# Patient Record
Sex: Female | Born: 1955 | Race: White | Hispanic: No | Marital: Married | Smoking: Former smoker
Health system: Southern US, Community
[De-identification: ages and names within clinical notes are randomized; demographics above are authoritative.]

## PROBLEM LIST (undated history)

## (undated) DIAGNOSIS — J449 Chronic obstructive pulmonary disease, unspecified: Secondary | ICD-10-CM

## (undated) DIAGNOSIS — F419 Anxiety disorder, unspecified: Secondary | ICD-10-CM

## (undated) DIAGNOSIS — G43909 Migraine, unspecified, not intractable, without status migrainosus: Secondary | ICD-10-CM

## (undated) DIAGNOSIS — K279 Peptic ulcer, site unspecified, unspecified as acute or chronic, without hemorrhage or perforation: Secondary | ICD-10-CM

## (undated) DIAGNOSIS — I341 Nonrheumatic mitral (valve) prolapse: Secondary | ICD-10-CM

## (undated) DIAGNOSIS — E785 Hyperlipidemia, unspecified: Secondary | ICD-10-CM

## (undated) DIAGNOSIS — F32A Depression, unspecified: Secondary | ICD-10-CM

## (undated) DIAGNOSIS — R011 Cardiac murmur, unspecified: Secondary | ICD-10-CM

## (undated) DIAGNOSIS — O43029 Fetus-to-fetus placental transfusion syndrome, unspecified trimester: Secondary | ICD-10-CM

## (undated) DIAGNOSIS — M797 Fibromyalgia: Secondary | ICD-10-CM

## (undated) DIAGNOSIS — K759 Inflammatory liver disease, unspecified: Secondary | ICD-10-CM

## (undated) DIAGNOSIS — M5090 Cervical disc disorder, unspecified, unspecified cervical region: Secondary | ICD-10-CM

## (undated) DIAGNOSIS — J439 Emphysema, unspecified: Secondary | ICD-10-CM

## (undated) DIAGNOSIS — M810 Age-related osteoporosis without current pathological fracture: Secondary | ICD-10-CM

## (undated) HISTORY — DX: Migraine, unspecified, not intractable, without status migrainosus: G43.909

## (undated) HISTORY — DX: Nonrheumatic mitral (valve) prolapse: I34.1

## (undated) HISTORY — DX: Fibromyalgia: M79.7

## (undated) HISTORY — DX: Depression, unspecified: F32.A

## (undated) HISTORY — DX: Age-related osteoporosis without current pathological fracture: M81.0

## (undated) HISTORY — DX: Hyperlipidemia, unspecified: E78.5

## (undated) HISTORY — DX: Chronic obstructive pulmonary disease, unspecified: J44.9

## (undated) HISTORY — DX: Cervical disc disorder, unspecified, unspecified cervical region: M50.90

## (undated) HISTORY — DX: Peptic ulcer, site unspecified, unspecified as acute or chronic, without hemorrhage or perforation: K27.9

## (undated) HISTORY — DX: Inflammatory liver disease, unspecified: K75.9

## (undated) HISTORY — PX: HYSTEROSCOPY: SHX211

## (undated) HISTORY — DX: Cardiac murmur, unspecified: R01.1

## (undated) HISTORY — DX: Anxiety disorder, unspecified: F41.9

## (undated) HISTORY — DX: Fetus-to-fetus placental transfusion syndrome, unspecified trimester: O43.029

## (undated) HISTORY — PX: URETHRAL DILATION: SUR417

---

## 1991-09-04 DIAGNOSIS — O43029 Fetus-to-fetus placental transfusion syndrome, unspecified trimester: Secondary | ICD-10-CM

## 1991-09-04 HISTORY — DX: Fetus-to-fetus placental transfusion syndrome, unspecified trimester: O43.029

## 1998-11-05 ENCOUNTER — Ambulatory Visit (HOSPITAL_COMMUNITY): Admission: RE | Admit: 1998-11-05 | Discharge: 1998-11-05 | Payer: Self-pay | Admitting: Obstetrics and Gynecology

## 1999-04-30 ENCOUNTER — Encounter: Payer: Self-pay | Admitting: Orthopedic Surgery

## 1999-04-30 ENCOUNTER — Ambulatory Visit (HOSPITAL_COMMUNITY): Admission: RE | Admit: 1999-04-30 | Discharge: 1999-04-30 | Payer: Self-pay | Admitting: Orthopedic Surgery

## 1999-05-05 ENCOUNTER — Encounter: Payer: Self-pay | Admitting: Orthopedic Surgery

## 1999-05-05 ENCOUNTER — Ambulatory Visit (HOSPITAL_COMMUNITY): Admission: RE | Admit: 1999-05-05 | Discharge: 1999-05-05 | Payer: Self-pay | Admitting: Orthopedic Surgery

## 1999-12-11 ENCOUNTER — Other Ambulatory Visit: Admission: RE | Admit: 1999-12-11 | Discharge: 1999-12-11 | Payer: Self-pay | Admitting: Family Medicine

## 2001-01-08 ENCOUNTER — Other Ambulatory Visit: Admission: RE | Admit: 2001-01-08 | Discharge: 2001-01-08 | Payer: Self-pay | Admitting: Family Medicine

## 2002-04-21 ENCOUNTER — Inpatient Hospital Stay (HOSPITAL_COMMUNITY): Admission: EM | Admit: 2002-04-21 | Discharge: 2002-04-25 | Payer: Self-pay | Admitting: Emergency Medicine

## 2002-04-21 ENCOUNTER — Encounter: Payer: Self-pay | Admitting: Internal Medicine

## 2002-04-23 ENCOUNTER — Encounter: Payer: Self-pay | Admitting: Gastroenterology

## 2002-04-24 ENCOUNTER — Encounter: Payer: Self-pay | Admitting: Internal Medicine

## 2002-05-15 ENCOUNTER — Encounter: Payer: Self-pay | Admitting: Gastroenterology

## 2002-05-15 ENCOUNTER — Ambulatory Visit (HOSPITAL_COMMUNITY): Admission: RE | Admit: 2002-05-15 | Discharge: 2002-05-15 | Payer: Self-pay | Admitting: Gastroenterology

## 2002-06-03 HISTORY — PX: CHOLECYSTECTOMY: SHX55

## 2002-06-04 ENCOUNTER — Observation Stay (HOSPITAL_COMMUNITY): Admission: RE | Admit: 2002-06-04 | Discharge: 2002-06-05 | Payer: Self-pay | Admitting: Surgery

## 2002-06-04 ENCOUNTER — Encounter (INDEPENDENT_AMBULATORY_CARE_PROVIDER_SITE_OTHER): Payer: Self-pay | Admitting: Specialist

## 2002-06-04 ENCOUNTER — Encounter: Payer: Self-pay | Admitting: Surgery

## 2002-06-18 ENCOUNTER — Ambulatory Visit (HOSPITAL_COMMUNITY): Admission: RE | Admit: 2002-06-18 | Discharge: 2002-06-18 | Payer: Self-pay | Admitting: Gastroenterology

## 2002-06-18 ENCOUNTER — Encounter: Payer: Self-pay | Admitting: Gastroenterology

## 2002-06-19 ENCOUNTER — Encounter: Admission: RE | Admit: 2002-06-19 | Discharge: 2002-06-19 | Payer: Self-pay | Admitting: *Deleted

## 2002-07-01 ENCOUNTER — Ambulatory Visit (HOSPITAL_COMMUNITY): Admission: RE | Admit: 2002-07-01 | Discharge: 2002-07-01 | Payer: Self-pay | Admitting: *Deleted

## 2002-07-01 ENCOUNTER — Encounter (INDEPENDENT_AMBULATORY_CARE_PROVIDER_SITE_OTHER): Payer: Self-pay | Admitting: *Deleted

## 2002-07-11 ENCOUNTER — Ambulatory Visit (HOSPITAL_COMMUNITY): Admission: RE | Admit: 2002-07-11 | Discharge: 2002-07-11 | Payer: Self-pay | Admitting: *Deleted

## 2002-07-11 ENCOUNTER — Ambulatory Visit (HOSPITAL_COMMUNITY): Admission: RE | Admit: 2002-07-11 | Discharge: 2002-07-11 | Payer: Self-pay | Admitting: Orthopedic Surgery

## 2002-07-11 ENCOUNTER — Encounter: Payer: Self-pay | Admitting: *Deleted

## 2002-07-11 ENCOUNTER — Encounter: Payer: Self-pay | Admitting: Orthopedic Surgery

## 2002-10-16 ENCOUNTER — Ambulatory Visit (HOSPITAL_COMMUNITY): Admission: RE | Admit: 2002-10-16 | Discharge: 2002-10-16 | Payer: Self-pay | Admitting: *Deleted

## 2002-10-16 ENCOUNTER — Encounter (INDEPENDENT_AMBULATORY_CARE_PROVIDER_SITE_OTHER): Payer: Self-pay | Admitting: Specialist

## 2003-04-12 ENCOUNTER — Ambulatory Visit (HOSPITAL_COMMUNITY): Admission: RE | Admit: 2003-04-12 | Discharge: 2003-04-12 | Payer: Self-pay | Admitting: Internal Medicine

## 2003-05-21 ENCOUNTER — Encounter (INDEPENDENT_AMBULATORY_CARE_PROVIDER_SITE_OTHER): Payer: Self-pay | Admitting: Specialist

## 2003-05-21 ENCOUNTER — Ambulatory Visit (HOSPITAL_COMMUNITY): Admission: RE | Admit: 2003-05-21 | Discharge: 2003-05-21 | Payer: Self-pay | Admitting: *Deleted

## 2003-08-13 ENCOUNTER — Ambulatory Visit (HOSPITAL_COMMUNITY): Admission: RE | Admit: 2003-08-13 | Discharge: 2003-08-13 | Payer: Self-pay | Admitting: *Deleted

## 2003-11-23 ENCOUNTER — Other Ambulatory Visit: Admission: RE | Admit: 2003-11-23 | Discharge: 2003-11-23 | Payer: Self-pay | Admitting: Family Medicine

## 2004-05-22 ENCOUNTER — Encounter: Admission: RE | Admit: 2004-05-22 | Discharge: 2004-05-22 | Payer: Self-pay | Admitting: Family Medicine

## 2004-05-24 ENCOUNTER — Inpatient Hospital Stay (HOSPITAL_COMMUNITY): Admission: AD | Admit: 2004-05-24 | Discharge: 2004-05-26 | Payer: Self-pay | Admitting: Family Medicine

## 2004-05-30 ENCOUNTER — Ambulatory Visit (HOSPITAL_COMMUNITY): Admission: RE | Admit: 2004-05-30 | Discharge: 2004-05-30 | Payer: Self-pay | Admitting: *Deleted

## 2004-07-14 ENCOUNTER — Ambulatory Visit (HOSPITAL_COMMUNITY): Admission: RE | Admit: 2004-07-14 | Discharge: 2004-07-14 | Payer: Self-pay | Admitting: *Deleted

## 2004-07-14 ENCOUNTER — Encounter (INDEPENDENT_AMBULATORY_CARE_PROVIDER_SITE_OTHER): Payer: Self-pay | Admitting: *Deleted

## 2004-07-31 ENCOUNTER — Ambulatory Visit: Payer: Self-pay | Admitting: Internal Medicine

## 2004-09-03 DIAGNOSIS — M5090 Cervical disc disorder, unspecified, unspecified cervical region: Secondary | ICD-10-CM

## 2004-09-03 HISTORY — DX: Cervical disc disorder, unspecified, unspecified cervical region: M50.90

## 2004-09-03 HISTORY — PX: CERVICAL LAMINECTOMY: SHX94

## 2004-10-04 ENCOUNTER — Ambulatory Visit: Payer: Self-pay | Admitting: Family Medicine

## 2004-11-08 ENCOUNTER — Ambulatory Visit: Payer: Self-pay | Admitting: Family Medicine

## 2004-12-14 ENCOUNTER — Other Ambulatory Visit: Admission: RE | Admit: 2004-12-14 | Discharge: 2004-12-14 | Payer: Self-pay | Admitting: Family Medicine

## 2004-12-14 ENCOUNTER — Ambulatory Visit: Payer: Self-pay | Admitting: Family Medicine

## 2004-12-22 ENCOUNTER — Ambulatory Visit: Payer: Self-pay | Admitting: Family Medicine

## 2005-01-12 ENCOUNTER — Encounter: Admission: RE | Admit: 2005-01-12 | Discharge: 2005-01-12 | Payer: Self-pay | Admitting: Orthopedic Surgery

## 2005-02-13 ENCOUNTER — Ambulatory Visit (HOSPITAL_COMMUNITY): Admission: RE | Admit: 2005-02-13 | Discharge: 2005-02-13 | Payer: Self-pay | Admitting: Neurosurgery

## 2005-02-16 ENCOUNTER — Encounter: Admission: RE | Admit: 2005-02-16 | Discharge: 2005-02-16 | Payer: Self-pay | Admitting: Neurosurgery

## 2005-02-21 ENCOUNTER — Ambulatory Visit: Payer: Self-pay | Admitting: Family Medicine

## 2005-02-26 ENCOUNTER — Ambulatory Visit: Payer: Self-pay | Admitting: Family Medicine

## 2005-03-22 ENCOUNTER — Ambulatory Visit: Payer: Self-pay | Admitting: Family Medicine

## 2005-04-24 ENCOUNTER — Ambulatory Visit: Payer: Self-pay | Admitting: Family Medicine

## 2005-05-17 ENCOUNTER — Ambulatory Visit: Payer: Self-pay | Admitting: Family Medicine

## 2005-05-18 ENCOUNTER — Encounter: Admission: RE | Admit: 2005-05-18 | Discharge: 2005-05-18 | Payer: Self-pay | Admitting: Orthopedic Surgery

## 2005-05-30 ENCOUNTER — Ambulatory Visit (HOSPITAL_COMMUNITY): Admission: RE | Admit: 2005-05-30 | Discharge: 2005-06-02 | Payer: Self-pay | Admitting: Neurosurgery

## 2005-09-10 ENCOUNTER — Ambulatory Visit: Payer: Self-pay | Admitting: Family Medicine

## 2005-09-19 ENCOUNTER — Ambulatory Visit: Payer: Self-pay | Admitting: Family Medicine

## 2005-09-20 ENCOUNTER — Ambulatory Visit: Payer: Self-pay | Admitting: Cardiology

## 2005-09-26 ENCOUNTER — Ambulatory Visit: Payer: Self-pay | Admitting: Family Medicine

## 2005-11-20 ENCOUNTER — Ambulatory Visit: Payer: Self-pay | Admitting: Family Medicine

## 2005-11-28 ENCOUNTER — Ambulatory Visit: Payer: Self-pay | Admitting: Family Medicine

## 2006-01-15 ENCOUNTER — Ambulatory Visit: Payer: Self-pay | Admitting: Family Medicine

## 2006-01-15 ENCOUNTER — Encounter: Payer: Self-pay | Admitting: Family Medicine

## 2006-01-16 ENCOUNTER — Other Ambulatory Visit: Admission: RE | Admit: 2006-01-16 | Discharge: 2006-01-16 | Payer: Self-pay | Admitting: Family Medicine

## 2006-01-18 ENCOUNTER — Ambulatory Visit: Payer: Self-pay | Admitting: Family Medicine

## 2006-02-10 ENCOUNTER — Encounter: Admission: RE | Admit: 2006-02-10 | Discharge: 2006-02-10 | Payer: Self-pay | Admitting: Rheumatology

## 2006-02-16 ENCOUNTER — Ambulatory Visit: Payer: Self-pay | Admitting: Family Medicine

## 2006-02-21 ENCOUNTER — Ambulatory Visit: Payer: Self-pay | Admitting: Family Medicine

## 2006-05-14 ENCOUNTER — Ambulatory Visit: Payer: Self-pay | Admitting: Family Medicine

## 2006-06-05 ENCOUNTER — Ambulatory Visit: Payer: Self-pay | Admitting: Family Medicine

## 2006-10-22 ENCOUNTER — Ambulatory Visit: Payer: Self-pay | Admitting: Internal Medicine

## 2007-01-21 ENCOUNTER — Ambulatory Visit: Payer: Self-pay | Admitting: Family Medicine

## 2007-01-21 LAB — CONVERTED CEMR LAB
ALT: 7 units/L (ref 0–40)
AST: 15 units/L (ref 0–37)
Albumin: 3.4 g/dL — ABNORMAL LOW (ref 3.5–5.2)
Alkaline Phosphatase: 93 units/L (ref 39–117)
BUN: 11 mg/dL (ref 6–23)
Bilirubin, Direct: 0.1 mg/dL (ref 0.0–0.3)
CO2: 28 meq/L (ref 19–32)
Calcium: 9 mg/dL (ref 8.4–10.5)
Chloride: 110 meq/L (ref 96–112)
Cholesterol: 219 mg/dL (ref 0–200)
Creatinine, Ser: 0.6 mg/dL (ref 0.4–1.2)
Direct LDL: 138.3 mg/dL
GFR calc Af Amer: 137 mL/min
GFR calc non Af Amer: 113 mL/min
Glucose, Bld: 91 mg/dL (ref 70–99)
HCT: 43.8 % (ref 36.0–46.0)
HDL: 45.9 mg/dL (ref >39.0)
Hemoglobin: 14.9 g/dL (ref 12.0–15.0)
MCHC: 34 g/dL (ref 30.0–36.0)
MCV: 96 fL (ref 78.0–100.0)
Platelets: 259 10*3/uL (ref 150–400)
Potassium: 3.6 meq/L (ref 3.5–5.1)
RBC: 4.56 M/uL (ref 3.87–5.11)
RDW: 13.3 % (ref 11.5–14.6)
Sodium: 142 meq/L (ref 135–145)
TSH: 2.33 microintl units/mL (ref 0.35–5.50)
Total Bilirubin: 0.4 mg/dL (ref 0.3–1.2)
Total CHOL/HDL Ratio: 4.8
Total Protein: 6.8 g/dL (ref 6.0–8.3)
Triglycerides: 144 mg/dL (ref 0–149)
VLDL: 29 mg/dL (ref 0–40)
WBC: 7.5 10*3/uL (ref 4.5–10.5)

## 2007-01-28 ENCOUNTER — Other Ambulatory Visit: Admission: RE | Admit: 2007-01-28 | Discharge: 2007-01-28 | Payer: Self-pay | Admitting: Family Medicine

## 2007-01-28 ENCOUNTER — Encounter: Payer: Self-pay | Admitting: Family Medicine

## 2007-01-28 ENCOUNTER — Ambulatory Visit: Payer: Self-pay | Admitting: Family Medicine

## 2007-04-09 ENCOUNTER — Encounter: Payer: Self-pay | Admitting: Family Medicine

## 2007-04-22 ENCOUNTER — Telehealth: Payer: Self-pay | Admitting: Family Medicine

## 2007-05-09 DIAGNOSIS — R519 Headache, unspecified: Secondary | ICD-10-CM | POA: Insufficient documentation

## 2007-05-09 DIAGNOSIS — R51 Headache: Secondary | ICD-10-CM | POA: Insufficient documentation

## 2007-05-16 ENCOUNTER — Ambulatory Visit: Payer: Self-pay | Admitting: Family Medicine

## 2007-05-16 DIAGNOSIS — T7841XA Arthus phenomenon, initial encounter: Secondary | ICD-10-CM | POA: Insufficient documentation

## 2007-05-16 DIAGNOSIS — E785 Hyperlipidemia, unspecified: Secondary | ICD-10-CM | POA: Insufficient documentation

## 2007-05-27 ENCOUNTER — Encounter: Payer: Self-pay | Admitting: Family Medicine

## 2007-05-27 LAB — CONVERTED CEMR LAB
ALT: 10 units/L (ref 0–35)
AST: 18 units/L (ref 0–37)
Albumin: 3.6 g/dL (ref 3.5–5.2)
Alkaline Phosphatase: 95 units/L (ref 39–117)
Bilirubin, Direct: 0.1 mg/dL (ref 0.0–0.3)
Cholesterol: 223 mg/dL (ref 0–200)
Direct LDL: 147.3 mg/dL
HDL: 53.1 mg/dL (ref >39.0)
Total Bilirubin: 0.6 mg/dL (ref 0.3–1.2)
Total CHOL/HDL Ratio: 4.2
Total Protein: 6.5 g/dL (ref 6.0–8.3)
Triglycerides: 102 mg/dL (ref 0–149)
VLDL: 20 mg/dL (ref 0–40)

## 2007-07-03 ENCOUNTER — Ambulatory Visit: Payer: Self-pay | Admitting: Family Medicine

## 2007-07-03 DIAGNOSIS — IMO0001 Reserved for inherently not codable concepts without codable children: Secondary | ICD-10-CM | POA: Insufficient documentation

## 2007-07-03 DIAGNOSIS — M797 Fibromyalgia: Secondary | ICD-10-CM | POA: Insufficient documentation

## 2007-09-09 ENCOUNTER — Ambulatory Visit: Payer: Self-pay | Admitting: Family Medicine

## 2007-09-09 DIAGNOSIS — J209 Acute bronchitis, unspecified: Secondary | ICD-10-CM | POA: Insufficient documentation

## 2007-09-22 ENCOUNTER — Telehealth: Payer: Self-pay | Admitting: Family Medicine

## 2007-10-01 ENCOUNTER — Telehealth: Payer: Self-pay | Admitting: Family Medicine

## 2007-10-14 ENCOUNTER — Ambulatory Visit: Payer: Self-pay | Admitting: Family Medicine

## 2007-10-14 DIAGNOSIS — R319 Hematuria, unspecified: Secondary | ICD-10-CM | POA: Insufficient documentation

## 2007-10-15 ENCOUNTER — Encounter: Payer: Self-pay | Admitting: Family Medicine

## 2007-10-18 ENCOUNTER — Emergency Department (HOSPITAL_COMMUNITY): Admission: EM | Admit: 2007-10-18 | Discharge: 2007-10-18 | Payer: Self-pay | Admitting: Emergency Medicine

## 2007-10-20 ENCOUNTER — Encounter: Payer: Self-pay | Admitting: Family Medicine

## 2007-10-23 ENCOUNTER — Encounter: Payer: Self-pay | Admitting: Family Medicine

## 2007-10-24 ENCOUNTER — Encounter: Admission: RE | Admit: 2007-10-24 | Discharge: 2007-10-24 | Payer: Self-pay | Admitting: Rheumatology

## 2007-11-26 ENCOUNTER — Telehealth: Payer: Self-pay | Admitting: Family Medicine

## 2007-12-15 ENCOUNTER — Ambulatory Visit (HOSPITAL_BASED_OUTPATIENT_CLINIC_OR_DEPARTMENT_OTHER): Admission: RE | Admit: 2007-12-15 | Discharge: 2007-12-15 | Payer: Self-pay | Admitting: Urology

## 2008-01-08 ENCOUNTER — Encounter: Payer: Self-pay | Admitting: Family Medicine

## 2008-01-20 ENCOUNTER — Ambulatory Visit: Payer: Self-pay | Admitting: Family Medicine

## 2008-01-20 DIAGNOSIS — R0789 Other chest pain: Secondary | ICD-10-CM | POA: Insufficient documentation

## 2008-01-22 ENCOUNTER — Telehealth (INDEPENDENT_AMBULATORY_CARE_PROVIDER_SITE_OTHER): Payer: Self-pay | Admitting: *Deleted

## 2008-03-16 ENCOUNTER — Telehealth: Payer: Self-pay | Admitting: Family Medicine

## 2008-03-23 ENCOUNTER — Ambulatory Visit: Payer: Self-pay | Admitting: Family Medicine

## 2008-03-23 ENCOUNTER — Other Ambulatory Visit: Admission: RE | Admit: 2008-03-23 | Discharge: 2008-03-23 | Payer: Self-pay | Admitting: Family Medicine

## 2008-03-23 ENCOUNTER — Encounter: Payer: Self-pay | Admitting: Family Medicine

## 2008-03-24 LAB — CONVERTED CEMR LAB: Pap Smear: NORMAL

## 2008-03-30 ENCOUNTER — Encounter: Payer: Self-pay | Admitting: Family Medicine

## 2008-04-15 ENCOUNTER — Encounter: Payer: Self-pay | Admitting: Family Medicine

## 2008-08-11 ENCOUNTER — Encounter: Payer: Self-pay | Admitting: Family Medicine

## 2008-09-09 ENCOUNTER — Telehealth: Payer: Self-pay | Admitting: Family Medicine

## 2008-10-04 ENCOUNTER — Ambulatory Visit (HOSPITAL_BASED_OUTPATIENT_CLINIC_OR_DEPARTMENT_OTHER): Admission: RE | Admit: 2008-10-04 | Discharge: 2008-10-04 | Payer: Self-pay | Admitting: Urology

## 2008-10-19 ENCOUNTER — Telehealth: Payer: Self-pay | Admitting: Family Medicine

## 2008-11-08 ENCOUNTER — Telehealth: Payer: Self-pay | Admitting: Family Medicine

## 2009-01-03 ENCOUNTER — Telehealth: Payer: Self-pay | Admitting: Family Medicine

## 2009-01-04 ENCOUNTER — Telehealth: Payer: Self-pay | Admitting: Family Medicine

## 2009-01-05 ENCOUNTER — Telehealth: Payer: Self-pay | Admitting: Family Medicine

## 2009-01-27 ENCOUNTER — Encounter: Payer: Self-pay | Admitting: Family Medicine

## 2009-03-14 ENCOUNTER — Ambulatory Visit: Payer: Self-pay | Admitting: Family Medicine

## 2009-03-24 ENCOUNTER — Ambulatory Visit: Payer: Self-pay | Admitting: Family Medicine

## 2009-03-24 ENCOUNTER — Other Ambulatory Visit: Admission: RE | Admit: 2009-03-24 | Discharge: 2009-03-24 | Payer: Self-pay | Admitting: Family Medicine

## 2009-03-24 ENCOUNTER — Encounter: Payer: Self-pay | Admitting: Family Medicine

## 2009-03-24 DIAGNOSIS — S336XXA Sprain of sacroiliac joint, initial encounter: Secondary | ICD-10-CM | POA: Insufficient documentation

## 2009-03-24 DIAGNOSIS — K219 Gastro-esophageal reflux disease without esophagitis: Secondary | ICD-10-CM | POA: Insufficient documentation

## 2009-03-24 LAB — CONVERTED CEMR LAB
ALT: 6 units/L (ref 0–35)
AST: 20 units/L (ref 0–37)
Albumin: 3.5 g/dL (ref 3.5–5.2)
Alkaline Phosphatase: 76 units/L (ref 39–117)
BUN: 12 mg/dL (ref 6–23)
Basophils Absolute: 0 10*3/uL (ref 0.0–0.1)
Basophils Relative: 0.2 % (ref 0.0–3.0)
Bilirubin, Direct: 0 mg/dL (ref 0.0–0.3)
CO2: 30 meq/L (ref 19–32)
Calcium: 9.3 mg/dL (ref 8.4–10.5)
Chloride: 105 meq/L (ref 96–112)
Cholesterol: 203 mg/dL — ABNORMAL HIGH (ref 0–200)
Creatinine, Ser: 0.6 mg/dL (ref 0.4–1.2)
Direct LDL: 128.6 mg/dL
Eosinophils Absolute: 0.1 10*3/uL (ref 0.0–0.7)
Eosinophils Relative: 1.6 % (ref 0.0–5.0)
GFR calc non Af Amer: 112.22 mL/min (ref >60)
Glucose, Bld: 96 mg/dL (ref 70–99)
HCT: 40.7 % (ref 36.0–46.0)
HDL: 57.3 mg/dL (ref >39.00)
Hemoglobin: 13.8 g/dL (ref 12.0–15.0)
Lymphocytes Relative: 31.4 % (ref 12.0–46.0)
Lymphs Abs: 2.3 10*3/uL (ref 0.7–4.0)
MCHC: 34 g/dL (ref 30.0–36.0)
MCV: 96.1 fL (ref 78.0–100.0)
Monocytes Absolute: 0.4 10*3/uL (ref 0.1–1.0)
Monocytes Relative: 5 % (ref 3.0–12.0)
Neutro Abs: 4.6 10*3/uL (ref 1.4–7.7)
Neutrophils Relative %: 61.8 % (ref 43.0–77.0)
Platelets: 184 10*3/uL (ref 150.0–400.0)
Potassium: 3.6 meq/L (ref 3.5–5.1)
RBC: 4.23 M/uL (ref 3.87–5.11)
RDW: 12.8 % (ref 11.5–14.6)
Sodium: 144 meq/L (ref 135–145)
TSH: 1.31 microintl units/mL (ref 0.35–5.50)
Total Bilirubin: 0.4 mg/dL (ref 0.3–1.2)
Total CHOL/HDL Ratio: 4
Total Protein: 6.7 g/dL (ref 6.0–8.3)
Triglycerides: 81 mg/dL (ref 0.0–149.0)
VLDL: 16.2 mg/dL (ref 0.0–40.0)
WBC: 7.4 10*3/uL (ref 4.5–10.5)

## 2009-03-29 ENCOUNTER — Telehealth: Payer: Self-pay | Admitting: Family Medicine

## 2009-03-29 ENCOUNTER — Encounter: Payer: Self-pay | Admitting: Family Medicine

## 2009-03-30 ENCOUNTER — Telehealth: Payer: Self-pay | Admitting: Family Medicine

## 2009-05-25 ENCOUNTER — Telehealth: Payer: Self-pay | Admitting: Family Medicine

## 2009-05-30 ENCOUNTER — Ambulatory Visit: Payer: Self-pay | Admitting: Internal Medicine

## 2009-05-30 DIAGNOSIS — J019 Acute sinusitis, unspecified: Secondary | ICD-10-CM | POA: Insufficient documentation

## 2009-06-02 ENCOUNTER — Ambulatory Visit: Payer: Self-pay | Admitting: Family Medicine

## 2009-06-22 ENCOUNTER — Encounter: Payer: Self-pay | Admitting: Family Medicine

## 2009-07-27 ENCOUNTER — Telehealth: Payer: Self-pay | Admitting: Family Medicine

## 2009-09-14 ENCOUNTER — Telehealth: Payer: Self-pay | Admitting: Family Medicine

## 2009-11-08 ENCOUNTER — Encounter: Payer: Self-pay | Admitting: Family Medicine

## 2009-12-06 ENCOUNTER — Telehealth: Payer: Self-pay | Admitting: Family Medicine

## 2009-12-22 ENCOUNTER — Telehealth: Payer: Self-pay | Admitting: Family Medicine

## 2010-02-24 ENCOUNTER — Encounter: Admission: RE | Admit: 2010-02-24 | Discharge: 2010-02-24 | Payer: Self-pay | Admitting: Family Medicine

## 2010-03-01 ENCOUNTER — Encounter: Payer: Self-pay | Admitting: Family Medicine

## 2010-03-03 ENCOUNTER — Encounter: Admission: RE | Admit: 2010-03-03 | Discharge: 2010-03-03 | Payer: Self-pay | Admitting: Family Medicine

## 2010-03-03 LAB — HM MAMMOGRAPHY

## 2010-03-28 ENCOUNTER — Ambulatory Visit: Payer: Self-pay | Admitting: Family Medicine

## 2010-03-28 ENCOUNTER — Other Ambulatory Visit: Admission: RE | Admit: 2010-03-28 | Discharge: 2010-03-28 | Payer: Self-pay | Admitting: Family Medicine

## 2010-03-28 LAB — CONVERTED CEMR LAB
Bilirubin Urine: NEGATIVE
Glucose, Urine, Semiquant: NEGATIVE
Ketones, urine, test strip: NEGATIVE
Nitrite: NEGATIVE
Protein, U semiquant: NEGATIVE
Specific Gravity, Urine: 1.015
Urobilinogen, UA: 0.2
WBC Urine, dipstick: NEGATIVE
pH: 5

## 2010-03-28 LAB — HM PAP SMEAR

## 2010-04-03 LAB — CONVERTED CEMR LAB
ALT: 6 units/L (ref 0–35)
AST: 14 units/L (ref 0–37)
Albumin: 3.6 g/dL (ref 3.5–5.2)
Alkaline Phosphatase: 106 units/L (ref 39–117)
BUN: 14 mg/dL (ref 6–23)
Basophils Absolute: 0 10*3/uL (ref 0.0–0.1)
Basophils Relative: 0.4 % (ref 0.0–3.0)
Bilirubin, Direct: 0 mg/dL (ref 0.0–0.3)
CO2: 28 meq/L (ref 19–32)
Calcium: 9.1 mg/dL (ref 8.4–10.5)
Chloride: 104 meq/L (ref 96–112)
Creatinine, Ser: 0.5 mg/dL (ref 0.4–1.2)
Eosinophils Absolute: 0.1 10*3/uL (ref 0.0–0.7)
Eosinophils Relative: 0.7 % (ref 0.0–5.0)
GFR calc non Af Amer: 144.58 mL/min (ref >60)
Glucose, Bld: 84 mg/dL (ref 70–99)
HCT: 41.5 % (ref 36.0–46.0)
Hemoglobin: 14.3 g/dL (ref 12.0–15.0)
Lymphocytes Relative: 33.6 % (ref 12.0–46.0)
Lymphs Abs: 2.8 10*3/uL (ref 0.7–4.0)
MCHC: 34.5 g/dL (ref 30.0–36.0)
MCV: 95.3 fL (ref 78.0–100.0)
Monocytes Absolute: 0.5 10*3/uL (ref 0.1–1.0)
Monocytes Relative: 6.1 % (ref 3.0–12.0)
Neutro Abs: 4.9 10*3/uL (ref 1.4–7.7)
Neutrophils Relative %: 59.2 % (ref 43.0–77.0)
Pap Smear: NEGATIVE
Platelets: 209 10*3/uL (ref 150.0–400.0)
Potassium: 3.8 meq/L (ref 3.5–5.1)
RBC: 4.35 M/uL (ref 3.87–5.11)
RDW: 14.4 % (ref 11.5–14.6)
Sodium: 139 meq/L (ref 135–145)
TSH: 1.4 microintl units/mL (ref 0.35–5.50)
Total Bilirubin: 0.4 mg/dL (ref 0.3–1.2)
Total Protein: 7.1 g/dL (ref 6.0–8.3)
Vit D, 25-Hydroxy: 58 ng/mL (ref 30–89)
WBC: 8.3 10*3/uL (ref 4.5–10.5)

## 2010-04-06 ENCOUNTER — Encounter: Payer: Self-pay | Admitting: Family Medicine

## 2010-06-27 ENCOUNTER — Telehealth: Payer: Self-pay | Admitting: Family Medicine

## 2010-06-27 ENCOUNTER — Ambulatory Visit: Payer: Self-pay | Admitting: Family Medicine

## 2010-06-27 DIAGNOSIS — F17200 Nicotine dependence, unspecified, uncomplicated: Secondary | ICD-10-CM | POA: Insufficient documentation

## 2010-07-05 ENCOUNTER — Telehealth: Payer: Self-pay | Admitting: Family Medicine

## 2010-08-22 ENCOUNTER — Telehealth: Payer: Self-pay | Admitting: Family Medicine

## 2010-08-25 ENCOUNTER — Ambulatory Visit: Payer: Self-pay | Admitting: Internal Medicine

## 2010-09-05 ENCOUNTER — Ambulatory Visit
Admission: RE | Admit: 2010-09-05 | Discharge: 2010-09-05 | Payer: Self-pay | Source: Home / Self Care | Attending: Emergency Medicine | Admitting: Emergency Medicine

## 2010-09-05 DIAGNOSIS — M25519 Pain in unspecified shoulder: Secondary | ICD-10-CM | POA: Insufficient documentation

## 2010-09-07 ENCOUNTER — Encounter: Payer: Self-pay | Admitting: Family Medicine

## 2010-09-24 ENCOUNTER — Encounter: Payer: Self-pay | Admitting: Rheumatology

## 2010-10-03 NOTE — Consult Note (Signed)
Summary: [facility]   Imported By: Kassie Mends 11/13/2007 13:24:02  _____________________________________________________________________  External Attachment:    Type:   Image     Comment:   [facility]

## 2010-10-03 NOTE — Miscellaneous (Signed)
Summary: Controlled Substances Contract  Controlled Substances Contract   Imported By: Maryln Gottron 06/28/2010 13:20:54  _____________________________________________________________________  External Attachment:    Type:   Image     Comment:   External Document

## 2010-10-03 NOTE — Consult Note (Signed)
Summary: [facility]   Imported By: Kassie Mends 12/03/2007 16:14:12  _____________________________________________________________________  External Attachment:    Type:   Image     Comment:   [facility]

## 2010-10-03 NOTE — Consult Note (Signed)
Summary: [facility]  [facility]   Imported By: Maryln Gottron 04/28/2008 15:53:42  _____________________________________________________________________  External Attachment:    Type:   Image     Comment:   External Document

## 2010-10-03 NOTE — Letter (Signed)
Summary: Results Follow-up Letter  [place] [facility]  [address] [place], [place] [address]   Phone: [phone]  Fax: [phone]    03/30/2008  [address] [place], [place]  [address]  Dear Ms. Ngo,   The following are the results of your recent test(s):  Test     Result     Pap Smear    Normal_______  Not Normal_____       Comments: ____  Almira Coaster , RN [place] at [facility]           Appended Document: Results Follow-up Letter Pap Smear report was negative.

## 2010-10-03 NOTE — Progress Notes (Signed)
Summary: rx zolpidem   Phone Note From Pharmacy   Caller: Madilyn Hook Dr. [phone]* Reason for Call: Needs renewal Summary of Call: refill zolipdem  Initial call taken by: Pura Spice, RN,  October 19, 2008 11:02 AM  Follow-up for Phone Call        request faxed to [facility] rx denied by dr [name]  Follow-up by: Pura Spice, RN,  October 19, 2008 11:04 AM

## 2010-10-03 NOTE — Assessment & Plan Note (Signed)
Summary: sinus inf/njr   Vital Signs:  Patient profile:   55 year old female Menstrual status:  postmenopausal Weight:      123 pounds Temp:     98.6 degrees F oral BP sitting:   102 / 80  (left arm) Cuff size:   regular  Vitals Entered By: Raechel Ache, RN (May 30, 2009 4:17 PM) CC: C/o sinus pain across cheeks, esp R side with jaw and ear hurting.   CC:  C/o sinus pain across cheeks and esp R side with jaw and ear hurting.Marland Kitchen  History of Present Illness: 55 year old patient seen today complaining  of increasing sinus congestion and and right hemifacial pain.  pain has been refractory to her analgesics.  She has been using these the next day without much benefit.  At times.  She has felt that she has had a low-grade fever.  She does have a history of prior sinus infections in the past.  She also has a history of allergic rhinitis and has been using the Zyrtec periodically.  Allergies: 1)  ! Nsaids 2)  Depakote (Divalproex Sodium)  Past History:  Past Medical History: Reviewed history from 05/09/2007 and no changes required. Peptic Ulcer MVP Heart Murmur Hepatitis/Jaundice High Cholesterol Migraines Headache  Review of Systems       The patient complains of fever and headaches.  The patient denies anorexia, weight loss, weight gain, vision loss, decreased hearing, hoarseness, chest pain, syncope, dyspnea on exertion, peripheral edema, prolonged cough, hemoptysis, abdominal pain, melena, hematochezia, severe indigestion/heartburn, hematuria, incontinence, genital sores, muscle weakness, suspicious skin lesions, transient blindness, difficulty walking, depression, unusual weight change, abnormal bleeding, enlarged lymph nodes, angioedema, and breast masses.    Physical Exam  General:  Well-developed,well-nourished,in no acute distress; alert,appropriate and cooperative throughout examination Head:  slight right maxillary sinus pain Eyes:  No corneal or conjunctival  inflammation noted. EOMI. Perrla. Funduscopic exam benign, without hemorrhages, exudates or papilledema. Vision grossly normal. Ears:  External ear exam shows no significant lesions or deformities.  Otoscopic examination reveals clear canals, tympanic membranes are intact bilaterally without bulging, retraction, inflammation or discharge. Hearing is grossly normal bilaterally. Nose:  External nasal examination shows no deformity or inflammation. Nasal mucosa are pink and moist without lesions or exudates. Mouth:  Oral mucosa and oropharynx without lesions or exudates.  Teeth in good repair. Neck:  No deformities, masses, or tenderness noted. Lungs:  Normal respiratory effort, chest expands symmetrically. Lungs are clear to auscultation, no crackles or wheezes.   Impression & Recommendations:  Problem # 1:  SINUSITIS, ACUTE (ICD-461.9)  Her updated medication list for this problem includes:    Doxycycline Hyclate 50 Mg Caps (Doxycycline hyclate) ..... One twice daily    Her updated medication list for this problem includes:    Doxycycline Hyclate 50 Mg Caps (Doxycycline hyclate) ..... One twice daily  Problem # 2:  HEADACHE (ICD-784.0)  Her updated medication list for this problem includes:    Zomig 5 Mg Tabs (Zolmitriptan) .Marland Kitchen... Take 1 tablet at onset of headaches    Adult Aspirin Low Strength 81 Mg Tbdp (Aspirin) .Marland Kitchen... 1 by mouth once daily    Esgic-plus 50-500-40 Mg Tabs (Butalbital-apap-caffeine) .Marland Kitchen... 1 every 4-6 hrs as needed  pain not to exceed 4  day    Endocet 10-650 Mg Tabs (Oxycodone-acetaminophen) .Marland Kitchen... 1 q4-6 hr as needed severe pain    Hydrocodone-acetaminophen 10-325 Mg Tabs (Hydrocodone-acetaminophen) .Marland Kitchen... 1 every 4-6 hrsp as needed pain.  Her updated medication list for  this problem includes:    Zomig 5 Mg Tabs (Zolmitriptan) .Marland Kitchen... Take 1 tablet at onset of headaches    Adult Aspirin Low Strength 81 Mg Tbdp (Aspirin) .Marland Kitchen... 1 by mouth once daily    Esgic-plus 50-500-40  Mg Tabs (Butalbital-apap-caffeine) .Marland Kitchen... 1 every 4-6 hrs as needed  pain not to exceed 4  day    Endocet 10-650 Mg Tabs (Oxycodone-acetaminophen) .Marland Kitchen... 1 q4-6 hr as needed severe pain    Hydrocodone-acetaminophen 10-325 Mg Tabs (Hydrocodone-acetaminophen) .Marland Kitchen... 1 every 4-6 hrsp as needed pain.  Complete Medication List: 1)  Prempro 0.625-2.5 Mg Tabs (Conj estrog-medroxyprogest ace) .... Take 1 tablet by mouth once a day 2)  Zomig 5 Mg Tabs (Zolmitriptan) .... Take 1 tablet at onset of headaches 3)  Adult Aspirin Low Strength 81 Mg Tbdp (Aspirin) .Marland Kitchen.. 1 by mouth once daily 4)  Diazepam 5 Mg Tabs (Diazepam) .Marland Kitchen.. 1 three times a day as needed stress 5)  Protonix 40 Mg Tbec (Pantoprazole sodium) .Marland Kitchen.. 1 by mouth once daily 6)  Zyrtec Allergy 10 Mg Tabs (Cetirizine hcl) .Marland Kitchen.. 1 by mouth once daily 7)  Crestor 10 Mg Tabs (Rosuvastatin calcium) .Marland Kitchen.. 1 by mouth once daily 8)  Esgic-plus 50-500-40 Mg Tabs (Butalbital-apap-caffeine) .Marland Kitchen.. 1 every 4-6 hrs as needed  pain not to exceed 4  day 9)  Endocet 10-650 Mg Tabs (Oxycodone-acetaminophen) .Marland Kitchen.. 1 q4-6 hr as needed severe pain 10)  Hydrocodone-acetaminophen 10-325 Mg Tabs (Hydrocodone-acetaminophen) .Marland Kitchen.. 1 every 4-6 hrsp as needed pain. 11)  Lyrica 25 Mg Caps (Pregabalin) .... Once daily 12)  Ambien 10 Mg Tabs (Zolpidem tartrate) .Marland Kitchen.. 1 by mouth hs as needed sleep 13)  Parafon Forte Dsc 500 Mg Tabs (Chlorzoxazone) .Marland Kitchen.. 1 by mouth four times a day  with meals 14)  Cymbalta 30 Mg Cpep (Duloxetine hcl) .Marland Kitchen.. 1 by mouth three times a day 15)  Lovaza 1 Gm Caps (Omega-3-acid ethyl esters) .Marland Kitchen.. 1 once daily two times a day 16)  Cyclobenzaprine Hcl 10 Mg Tabs (Cyclobenzaprine hcl) .Marland Kitchen.. 1 by mouth three times a day for muscle spasms 17)  Doxycycline Hyclate 50 Mg Caps (Doxycycline hyclate) .... One twice daily  Other Orders: Prescription Created Electronically [id number])  Patient Instructions: 1)  continue Mucinex D- twice daily 2)  Take your antibiotic as  prescribed until ALL of it is gone, but stop if you develop a rash or swelling and contact our office as soon as possible. Prescriptions: DOXYCYCLINE HYCLATE 50 MG CAPS (DOXYCYCLINE HYCLATE) one twice daily  #20 x 0   Entered and Authorized by:   Gordy Savers  MD   Signed by:   Gordy Savers  MD on 05/30/2009   Method used:   Electronically to        [name] (retail)       [address]       [place], [place]  [address]       Ph: [phone] or [phone]       Fax: [phone]   RxID:   [id number] DOXYCYCLINE HYCLATE 50 MG CAPS (DOXYCYCLINE HYCLATE) one twice daily  #20 x 0   Entered and Authorized by:   Gordy Savers  MD   Signed by:   Gordy Savers  MD on 05/30/2009   Method used:   Print then Give to Patient   RxID:   [id number]

## 2010-10-03 NOTE — Letter (Signed)
Summary: [facility]  [facility]   Imported By: Maryln Gottron 12/08/2009 13:31:11  _____________________________________________________________________  External Attachment:    Type:   Image     Comment:   External Document

## 2010-10-03 NOTE — Progress Notes (Signed)
Summary: hydrocodone w 5 refills.  Phone Note From Pharmacy   Caller: [name] [phone]* Reason for Call: Needs renewal Summary of Call: HYDROCODONE 10/325  Initial call taken by: Pura Spice, RN,  November 26, 2007 11:05 AM    New/Updated Medications: HYDROCODONE-ACETAMINOPHEN 10-325 MG  TABS (HYDROCODONE-ACETAMINOPHEN) 1 every 4-6 hrsp as needed pain.   Prescriptions: HYDROCODONE-ACETAMINOPHEN 10-325 MG  TABS (HYDROCODONE-ACETAMINOPHEN) 1 every 4-6 hrsp as needed pain.  #100 x 5   Entered and Authorized by:   Pura Spice, RN   Signed by:   Pura Spice, RN on 11/26/2007   Method used:   Printed then faxed to ...       [name]       [address]       [place], [place]  [address]       Ph: [phone] or [phone]       Fax: [phone]   RxID:   [id number]

## 2010-10-03 NOTE — Progress Notes (Signed)
Summary: Ambien refill?  Phone Note Refill Request Message from:  Patient on June 27, 2010 2:54 PM  Refills Requested: Medication #1:  AMBIEN 10 MG  TABS 1 by mouth hs as needed sleep   Dosage confirmed as above?Dosage Confirmed Please advise?  Initial call taken by: Josph Macho RMA,  June 27, 2010 2:54 PM  Follow-up for Phone Call        [facility] to refill with same sig, #30 AND 1 RF Follow-up by: Danise Edge MD,  June 27, 2010 5:19 PM    Prescriptions: AMBIEN 10 MG  TABS (ZOLPIDEM TARTRATE) 1 by mouth hs as needed sleep  #30 x 1   Entered by:   Josph Macho RMA   Authorized by:   Danise Edge MD   Signed by:   Josph Macho RMA on 06/28/2010   Method used:   Telephoned to ...       [facility] (retail)       [address] [place], [place]  [address]       Ph: [phone]       Fax: [phone]   RxID:   [id number]  Was telephoned into [name] [place] ([facility]

## 2010-10-03 NOTE — Progress Notes (Signed)
Summary: REFILL  BUT/APAP /CAF 50/500/40 W 5 REFILLS   Phone Note From Pharmacy   Caller: [name] [phone]* Summary of Call: REFILL BUT [facility] CAF 50/500/40    New/Updated Medications: BUTALBITAL-APAP-CAFFEINE 50-500-40 MG  TABS (BUTALBITAL-APAP-CAFFEINE) take 2  tab q4h as needed for headache   Prescriptions: BUTALBITAL-APAP-CAFFEINE 50-500-40 MG  TABS (BUTALBITAL-APAP-CAFFEINE) take 2  tab q4h as needed for headache  #60 x 5   Entered by:   Pura Spice, RN   Authorized by:   [name] MD   Signed by:   Pura Spice, RN on 10/01/2007   Method used:   Telephoned to ...       [name]       [address]       [place], [place]  [address]       Ph: [phone] or [phone]       Fax: [phone]   RxID:   [id number]

## 2010-10-03 NOTE — Letter (Signed)
Summary: [facility]  [facility]   Imported By: Maryln Gottron 02/21/2009 09:05:27  _____________________________________________________________________  External Attachment:    Type:   Image     Comment:   External Document

## 2010-10-03 NOTE — Letter (Signed)
Summary: [facility]  [facility]   Imported By: Maryln Gottron 09/01/2008 13:15:52  _____________________________________________________________________  External Attachment:    Type:   Image     Comment:   External Document

## 2010-10-03 NOTE — Assessment & Plan Note (Signed)
Summary: sinus fup//ccm   Vital Signs:  Patient profile:   55 year old female Menstrual status:  postmenopausal O2 Sat:      92 % Temp:     99.5 degrees F Pulse rate:   102 / minute BP sitting:   110 / 80  (left arm)  Vitals Entered By: Pura Spice, RN (June 02, 2009 10:00 AM) CC: saw dr Kirtland Bouchard but no better c/o facial pain coughing    Contraindications/Deferment of Procedures/Staging:    Test/Procedure: Weight Refused    Reason for deferment: patient declined-cannot calculate BMI   History of Present Illness: Pt not improved, facial pressure, nasal drainage, yellow drainage, increased coughing, no wheezing general malaise, low grade fever with some chills, general malaise continues on cymbalta and lyrica for fibromyalgia, by Dr.Devonscher post menopausal  continues tohave headaches and needs rx   Allergies: 1)  ! Nsaids 2)  Depakote (Divalproex Sodium)  Review of Systems  The patient denies anorexia, fever, weight loss, weight gain, vision loss, decreased hearing, hoarseness, chest pain, syncope, dyspnea on exertion, peripheral edema, prolonged cough, headaches, hemoptysis, abdominal pain, melena, hematochezia, severe indigestion/heartburn, hematuria, incontinence, genital sores, muscle weakness, suspicious skin lesions, transient blindness, difficulty walking, depression, unusual weight change, abnormal bleeding, enlarged lymph nodes, angioedema, breast masses, and testicular masses.    Physical Exam  General:  Well-developed,well-nourished,in no acute distress; alert,appropriate and cooperative throughout examination Head:  Normocephalic and atraumatic without obvious abnormalities. No apparent alopecia or balding. Eyes:  No corneal or conjunctival inflammation noted. EOMI. Perrla. Funduscopic exam benign, without hemorrhages, exudates or papilledema. Vision grossly normal. Ears:  External ear exam shows no significant lesions or deformities.  Otoscopic examination  reveals clear canals, tympanic membranes are intact bilaterally without bulging, retraction, inflammation or discharge. Hearing is grossly normal bilaterally. Nose:  erythematous mucos with yellow drainage, maxillary sinus tenderness bilaterally Lungs:  rhochi mild bilaterally, no dullness , no rales Heart:  Normal rate and regular rhythm. S1 and S2 normal without gallop, murmur, click, rub or other extra sounds. Abdomen:  Bowel sounds positive,abdomen soft and non-tender without masses, organomegaly or hernias noted.   Impression & Recommendations:  Problem # 1:  SINUSITIS, ACUTE (ICD-461.9) Assessment Deteriorated  Her updated medication list for this problem includes:    Doxycycline Hyclate 50 Mg Caps (Doxycycline hyclate) ..... One twice daily    Levaquin 500 Mg Tabs (Levofloxacin) .Marland Kitchen... 1 once daily for 10 days  Problem # 2:  GERD (ICD-530.81) Assessment: Unchanged  Her updated medication list for this problem includes:    Protonix 40 Mg Tbec (Pantoprazole sodium) .Marland Kitchen... 1 by mouth once daily  Problem # 3:  ACUTE BRONCHITIS (ICD-466.0) Assessment: Deteriorated  Her updated medication list for this problem includes:    Doxycycline Hyclate 50 Mg Caps (Doxycycline hyclate) ..... One twice daily    Levaquin 500 Mg Tabs (Levofloxacin) .Marland Kitchen... 1 once daily for 10 days  Problem # 4:  FIBROMYALGIA (ICD-729.1) Assessment: Unchanged  Her updated medication list for this problem includes:    Adult Aspirin Low Strength 81 Mg Tbdp (Aspirin) .Marland Kitchen... 1 by mouth once daily    Esgic-plus 50-500-40 Mg Tabs (Butalbital-apap-caffeine) .Marland Kitchen... 1 every 4-6 hrs as needed  pain not to exceed 4  day    Endocet 10-650 Mg Tabs (Oxycodone-acetaminophen) .Marland Kitchen... 1 q4-6 hr as needed severe pain    Hydrocodone-acetaminophen 10-325 Mg Tabs (Hydrocodone-acetaminophen) .Marland Kitchen... 1 every 4-6 hrsp as needed pain.    Parafon Forte Dsc 500 Mg Tabs (Chlorzoxazone) .Marland KitchenMarland KitchenMarland KitchenMarland Kitchen 1  by mouth four times a day  with meals     Cyclobenzaprine Hcl 10 Mg Tabs (Cyclobenzaprine hcl) .Marland Kitchen... 1 by mouth three times a day for muscle spasms  Problem # 5:  HEADACHE (ICD-784.0) Assessment: Unchanged  Her updated medication list for this problem includes:    Zomig 5 Mg Tabs (Zolmitriptan) .Marland Kitchen... Take 1 tablet at onset of headaches    Adult Aspirin Low Strength 81 Mg Tbdp (Aspirin) .Marland Kitchen... 1 by mouth once daily    Esgic-plus 50-500-40 Mg Tabs (Butalbital-apap-caffeine) .Marland Kitchen... 1 every 4-6 hrs as needed  pain not to exceed 4  day    Endocet 10-650 Mg Tabs (Oxycodone-acetaminophen) .Marland Kitchen... 1 q4-6 hr as needed severe pain    Hydrocodone-acetaminophen 10-325 Mg Tabs (Hydrocodone-acetaminophen) .Marland Kitchen... 1 every 4-6 hrsp as needed pain.  Problem # 6:  FIBROMYALGIA (ICD-729.1)  Complete Medication List: 1)  Prempro 0.625-2.5 Mg Tabs (Conj estrog-medroxyprogest ace) .... Take 1 tablet by mouth once a day 2)  Zomig 5 Mg Tabs (Zolmitriptan) .... Take 1 tablet at onset of headaches 3)  Adult Aspirin Low Strength 81 Mg Tbdp (Aspirin) .Marland Kitchen.. 1 by mouth once daily 4)  Diazepam 5 Mg Tabs (Diazepam) .Marland Kitchen.. 1 three times a day as needed stress 5)  Protonix 40 Mg Tbec (Pantoprazole sodium) .Marland Kitchen.. 1 by mouth once daily 6)  Zyrtec Allergy 10 Mg Tabs (Cetirizine hcl) .Marland Kitchen.. 1 by mouth once daily 7)  Crestor 10 Mg Tabs (Rosuvastatin calcium) .Marland Kitchen.. 1 by mouth once daily 8)  Esgic-plus 50-500-40 Mg Tabs (Butalbital-apap-caffeine) .Marland Kitchen.. 1 every 4-6 hrs as needed  pain not to exceed 4  day 9)  Endocet 10-650 Mg Tabs (Oxycodone-acetaminophen) .Marland Kitchen.. 1 q4-6 hr as needed severe pain 10)  Hydrocodone-acetaminophen 10-325 Mg Tabs (Hydrocodone-acetaminophen) .Marland Kitchen.. 1 every 4-6 hrsp as needed pain. 11)  Lyrica 25 Mg Caps (Pregabalin) .... Once daily 12)  Ambien 10 Mg Tabs (Zolpidem tartrate) .Marland Kitchen.. 1 by mouth hs as needed sleep 13)  Parafon Forte Dsc 500 Mg Tabs (Chlorzoxazone) .Marland Kitchen.. 1 by mouth four times a day  with meals 14)  Cymbalta 30 Mg Cpep (Duloxetine hcl) .Marland Kitchen.. 1 by mouth  three times a day 15)  Lovaza 1 Gm Caps (Omega-3-acid ethyl esters) .Marland Kitchen.. 1 once daily two times a day 16)  Cyclobenzaprine Hcl 10 Mg Tabs (Cyclobenzaprine hcl) .Marland Kitchen.. 1 by mouth three times a day for muscle spasms 17)  Doxycycline Hyclate 50 Mg Caps (Doxycycline hyclate) .... One twice daily 18)  Levaquin 500 Mg Tabs (Levofloxacin) .Marland Kitchen.. 1 once daily for 10 days  Patient Instructions: 1)  since getting more sever, changed to levaquin 2)  continue other medications

## 2010-10-03 NOTE — Letter (Signed)
Summary: Dr. Bedelia Person note  Dr. Bedelia Person note   Imported By: Kassie Mends 06/26/2007 14:40:23  _____________________________________________________________________  External Attachment:    Type:   Image     Comment:   Dr. Corliss Skains note

## 2010-10-03 NOTE — Assessment & Plan Note (Signed)
Summary: cpx/pap/mhf   Vital Signs:  Patient profile:   55 year old female Menstrual status:  postmenopausal LMP:     2002 Height:      65.5 inches Weight:      121 pounds BMI:     19.90 O2 Sat:      90 % Temp:     98.3 degrees F Pulse rate:   94 / minute BP sitting:   102 / 60  (left arm)  Vitals Entered By: Pura Spice, RN (March 24, 2009 9:10 AM)  History of Present Illness: Pt in for cpx Chronic back pain, being txed by orthopedist and rheumatologist, Dr. Manya Silvas also on cynbalta for fibromyalgiaq, as well as lyrica, recently pain down left leg with back pain contines to have tension headACHES, RELIEVED WITH Esgic, also migraine , takes Zomig contiues to have gerd and takes protonix daily and prevents takes vit D two times a day,50,ooo units crestor for hyperlipidemia  Allergies: 1)  ! Nsaids 2)  Depakote (Divalproex Sodium)  Review of Systems      See HPI General:  Complains of fatigue and malaise. Eyes:  Denies blurring, discharge, double vision, eye irritation, eye pain, halos, itching, light sensitivity, red eye, vision loss-1 eye, and vision loss-both eyes. ENT:  Denies decreased hearing, difficulty swallowing, ear discharge, earache, hoarseness, nasal congestion, nosebleeds, postnasal drainage, ringing in ears, sinus pressure, and sore throat. CV:  Denies bluish discoloration of lips or nails, chest pain or discomfort, difficulty breathing at night, difficulty breathing while lying down, fainting, fatigue, leg cramps with exertion, lightheadness, near fainting, palpitations, shortness of breath with exertion, swelling of feet, swelling of hands, and weight gain. Resp:  Denies chest discomfort, chest pain with inspiration, cough, coughing up blood, excessive snoring, hypersomnolence, morning headaches, pleuritic, shortness of breath, sputum productive, and wheezing; smoker. GI:  Complains of indigestion; Protonix. GU:  Denies abnormal vaginal bleeding, decreased  libido, discharge, dysuria, genital sores, hematuria, incontinence, nocturia, urinary frequency, and urinary hesitancy. MS:  See HPI; Complains of joint pain, low back pain, and muscle aches. Derm:  Denies changes in color of skin, changes in nail beds, dryness, excessive perspiration, flushing, hair loss, insect bite(s), itching, lesion(s), poor wound healing, and rash. Neuro:  Denies brief paralysis, difficulty with concentration, disturbances in coordination, falling down, headaches, inability to speak, memory loss, numbness, poor balance, seizures, sensation of room spinning, tingling, tremors, visual disturbances, and weakness. Psych:  Complains of anxiety and depression. Endo:  Denies cold intolerance, excessive hunger, excessive thirst, excessive urination, heat intolerance, polyuria, and weight change. Heme:  Denies abnormal bruising, bleeding, enlarge lymph nodes, fevers, pallor, and skin discoloration.  Physical Exam  General:  Well-developed,well-nourished,in no acute distress; alert,appropriate and cooperative throughout examinationunderweight appearing.   Head:  Normocephalic and atraumatic without obvious abnormalities. No apparent alopecia or balding. Eyes:  No corneal or conjunctival inflammation noted. EOMI. Perrla. Funduscopic exam benign, without hemorrhages, exudates or papilledema. Vision grossly normal. Ears:  External ear exam shows no significant lesions or deformities.  Otoscopic examination reveals clear canals, tympanic membranes are intact bilaterally without bulging, retraction, inflammation or discharge. Hearing is grossly normal bilaterally. Nose:  External nasal examination shows no deformity or inflammation. Nasal mucosa are pink and moist without lesions or exudates. Mouth:  Oral mucosa and oropharynx without lesions or exudates.  Teeth in good repair. Neck:  No deformities, masses, or tenderness noted. Chest Wall:  No deformities, masses, or tenderness  noted. Breasts:  No mass, nodules, thickening, tenderness, bulging,  retraction, inflamation, nipple discharge or skin changes noted.   Lungs:  Normal respiratory effort, chest expands symmetrically. Lungs are clear to auscultation, no crackles or wheezes. Heart:  Normal rate and regular rhythm. S1 and S2 normal without gallop, murmur, click, rub or other extra sounds. Abdomen:  Bowel sounds positive,abdomen soft and non-tender without masses, organomegaly or hernias noted. Rectal:  No external abnormalities noted. Normal sphincter tone. No rectal masses or tenderness. Genitalia:  Normal introitus for age, no external lesions, no vaginal discharge, mucosa pink and moist, no vaginal or cervical lesions, no vaginal atrophy, no friaility or hemorrhage, normal uterus size and position, no adnexal masses or tenderness Msk:  tender left SI joint Pulses:  R and L carotid,radial,femoral,dorsalis pedis and posterior tibial pulses are full and equal bilaterally Extremities:  No clubbing, cyanosis, edema, or deformity noted with normal full range of motion of all joints.   Neurologic:  No cranial nerve deficits noted. Station and gait are normal. Plantar reflexes are down-going bilaterally. DTRs are symmetrical throughout. Sensory, motor and coordinative functions appear intact. Skin:  Intact without suspicious lesions or rashes Cervical Nodes:  No lymphadenopathy noted Axillary Nodes:  No palpable lymphadenopathy Inguinal Nodes:  No significant adenopathy Psych:  Cognition and judgment appear intact. Alert and cooperative with normal attention span and concentration. No apparent delusions, illusions, hallucinations   Impression & Recommendations:  Problem # 1:  PHYSICAL EXAMINATION (ICD-V70.0) Assessment Unchanged  Problem # 2:  SACROILIAC STRAIN (ICD-846.9) Assessment: New  Orders: Depo- Medrol 80mg  (J1040) Admin of Therapeutic Inj  intramuscular or subcutaneous (84166)  Problem # 3:   FIBROMYALGIA (ICD-729.1) Assessment: Unchanged  Her updated medication list for this problem includes:    Adult Aspirin Low Strength 81 Mg Tbdp (Aspirin) .Marland Kitchen... 1 by mouth once daily    Esgic-plus 50-500-40 Mg Tabs (Butalbital-apap-caffeine) .Marland Kitchen... 1 every 4-6 hrs as needed  pain not to exceed 4  day    Endocet 10-650 Mg Tabs (Oxycodone-acetaminophen) .Marland Kitchen... 1 q4-6 hr as needed severe pain    Hydrocodone-acetaminophen 10-325 Mg Tabs (Hydrocodone-acetaminophen) .Marland Kitchen... 1 every 4-6 hrsp as needed pain.    Parafon Forte Dsc 500 Mg Tabs (Chlorzoxazone) .Marland Kitchen... 1 by mouth four times a day  with meals    Cyclobenzaprine Hcl 10 Mg Tabs (Cyclobenzaprine hcl) .Marland Kitchen... 1 by mouth three times a day for muscle spasms  Problem # 4:  HYPERLIPIDEMIA NEC/NOS (ICD-272.4) Assessment: Improved  Her updated medication list for this problem includes:    Crestor 10 Mg Tabs (Rosuvastatin calcium) .Marland Kitchen... 1 by mouth once daily    Lovaza 1 Gm Caps (Omega-3-acid ethyl esters) .Marland Kitchen... 1 once daily two times a day  Problem # 5:  HEADACHE (ICD-784.0) Assessment: Unchanged  Her updated medication list for this problem includes:    Zomig 5 Mg Tabs (Zolmitriptan) .Marland Kitchen... Take 1 tablet at onset of headaches    Adult Aspirin Low Strength 81 Mg Tbdp (Aspirin) .Marland Kitchen... 1 by mouth once daily    Esgic-plus 50-500-40 Mg Tabs (Butalbital-apap-caffeine) .Marland Kitchen... 1 every 4-6 hrs as needed  pain not to exceed 4  day    Endocet 10-650 Mg Tabs (Oxycodone-acetaminophen) .Marland Kitchen... 1 q4-6 hr as needed severe pain    Hydrocodone-acetaminophen 10-325 Mg Tabs (Hydrocodone-acetaminophen) .Marland Kitchen... 1 every 4-6 hrsp as needed pain.  Problem # 6:  GERD (ICD-530.81) Assessment: Improved  Her updated medication list for this problem includes:    Protonix 40 Mg Tbec (Pantoprazole sodium) .Marland Kitchen... 1 by mouth once daily  Orders: Prescription Created Electronically [id number])  Complete Medication List: 1)  Prempro 0.625-2.5 Mg Tabs (Conj estrog-medroxyprogest ace) .... Take  1 tablet by mouth once a day 2)  Zomig 5 Mg Tabs (Zolmitriptan) .... Take 1 tablet at onset of headaches 3)  Adult Aspirin Low Strength 81 Mg Tbdp (Aspirin) .Marland Kitchen.. 1 by mouth once daily 4)  Diazepam 5 Mg Tabs (Diazepam) .Marland Kitchen.. 1 three times a day as needed stress 5)  Protonix 40 Mg Tbec (Pantoprazole sodium) .Marland Kitchen.. 1 by mouth once daily 6)  Zyrtec Allergy 10 Mg Tabs (Cetirizine hcl) .Marland Kitchen.. 1 by mouth once daily 7)  Crestor 10 Mg Tabs (Rosuvastatin calcium) .Marland Kitchen.. 1 by mouth once daily 8)  Esgic-plus 50-500-40 Mg Tabs (Butalbital-apap-caffeine) .Marland Kitchen.. 1 every 4-6 hrs as needed  pain not to exceed 4  day 9)  Endocet 10-650 Mg Tabs (Oxycodone-acetaminophen) .Marland Kitchen.. 1 q4-6 hr as needed severe pain 10)  Hydrocodone-acetaminophen 10-325 Mg Tabs (Hydrocodone-acetaminophen) .Marland Kitchen.. 1 every 4-6 hrsp as needed pain. 11)  Lyrica 25 Mg Caps (Pregabalin) .... Once daily 12)  Ambien 10 Mg Tabs (Zolpidem tartrate) .Marland Kitchen.. 1 by mouth hs as needed sleep needs office visit. 13)  Parafon Forte Dsc 500 Mg Tabs (Chlorzoxazone) .Marland Kitchen.. 1 by mouth four times a day  with meals 14)  Cymbalta 30 Mg Cpep (Duloxetine hcl) .Marland Kitchen.. 1 by mouth three times a day 15)  Lovaza 1 Gm Caps (Omega-3-acid ethyl esters) .Marland Kitchen.. 1 once daily two times a day 16)  Cyclobenzaprine Hcl 10 Mg Tabs (Cyclobenzaprine hcl) .Marland Kitchen.. 1 by mouth three times a day for muscle spasms  Contraindications/Deferment of Procedures/Staging:    Test/Procedure: TD vaccine    Reason for deferment: declined   Patient Instructions: 1)  continue medications as prescribed, continue to see rhemaaatologist on regulr basis 2)  Return or call me as needed 3)  call when ready for colonoscopic exam which you need Prescriptions: CYCLOBENZAPRINE HCL 10 MG TABS (CYCLOBENZAPRINE HCL) 1 by mouth three times a day for muscle spasms  #90 x 1   Entered by:   Pura Spice, RN   Authorized by:   [name] MD   Signed by:   Pura Spice, RN on 04/22/2009   Method used:   Electronically to         [facility] (retail)       [address]       [place], [place]  [address]       Ph: [phone] or [phone]       Fax: [phone]   RxID:   [id number] DIAZEPAM 5 MG  TABS (DIAZEPAM) 1 three times a day as needed stress  #90 x 5   Entered and Authorized by:   [name] MD   Signed by:   [name] MD on 03/30/2009   Method used:   Print then Give to Patient   RxID:   [id number] ENDOCET 10-650 MG  TABS (OXYCODONE-ACETAMINOPHEN) 1 q4-6 hr as needed severe pain  #100 x 0   Entered and Authorized by:   [name] MD   Signed by:   [name] MD on 03/24/2009   Method used:   Print then Give to Patient   RxID:   [id number] ESGIC-PLUS 50-500-40 MG  TABS (BUTALBITAL-APAP-CAFFEINE) 1 every 4-6 hrs as needed  pain not to exceed 4  day  #120 x 5   Entered and Authorized by:  [name] MD   Signed by:   [name] MD on 03/24/2009   Method used:   Print then Give to Patient   RxID:   [id number] PARAFON FORTE DSC 500 MG TABS (CHLORZOXAZONE) 1 by mouth four times a day  with meals  #120 x 11   Entered and Authorized by:   [name] MD   Signed by:   [name] MD on 03/24/2009   Method used:   Electronically to        [facility] (retail)       [address]       [place], [place]  [address]       Ph: [phone] or [phone]       Fax: [phone]   RxID:   [id number] CRESTOR 10 MG  TABS (ROSUVASTATIN CALCIUM) 1 by mouth once daily  #30 x 11   Entered and Authorized by:   [name] MD   Signed by:   [name] MD on 03/24/2009   Method used:   Electronically to        [facility] (retail)       [address]       [place], [place]  [address]       Ph: [phone] or [phone]       Fax: [phone]   RxID:   [id number] PROTONIX 40 MG   TBEC (PANTOPRAZOLE SODIUM) 1 by mouth once daily  #30 x 11   Entered and Authorized by:   [name] MD   Signed by:   [name] MD on 03/24/2009   Method used:   Electronically to        [facility] (retail)       [address]       [place], [place]  [address]       Ph: [phone] or [phone]       Fax: [phone]   RxID:   [id number] ZOMIG 5 MG  TABS (ZOLMITRIPTAN) take 1 tablet at onset of headaches  #6 x 11   Entered and Authorized by:   [name] MD   Signed by:   [name] MD on 03/24/2009   Method used:   Electronically to        [facility] (retail)       [address]       [place], [place]  [address]       Ph: [phone] or [phone]       Fax: [phone]   RxID:   [id number] PREMPRO 0.625-2.5 MG TABS (CONJ ESTROG-MEDROXYPROGEST ACE) Take 1 tablet by mouth once a day  #28 x 11   Entered and Authorized by:   [name] MD   Signed by:   [name] MD on 03/24/2009   Method used:   Electronically to        [facility] (retail)       [address]       [place], [place]  [address]       Ph: [phone] or  [phone]       Fax: [phone]   RxID:   [id number]   Preventive Care Screening  Pap Smear:    Date:  03/24/2008    Next Due:  04/2009    Results:  normal       Medication Administration  Injection # 1:    Medication: Depo- Medrol 80mg     Diagnosis: SACROILIAC STRAIN (ICD-846.9)    Route: IM    Site: LUOQ gluteus    Exp Date: 09/2011    Lot #: Roselyn Meier    Mfr: [employer]acia    Comments: 120mg  given    Patient tolerated injection without complications    Given by: Pura Spice, RN (March 24, 2009 10:54 AM)  Orders Added: 1)  Depo- Medrol 80mg  [J1040] 2)  Admin of Therapeutic Inj  intramuscular or subcutaneous [96[address]] 3)  Prescription Created Electronically  [G8553] 4)  Est. Patient 40-64 years [id number]

## 2010-10-03 NOTE — Letter (Signed)
Summary: [facility]  [facility]   Imported By: Maryln Gottron 09/07/2009 15:39:48  _____________________________________________________________________  External Attachment:    Type:   Image     Comment:   External Document

## 2010-10-03 NOTE — Progress Notes (Signed)
Summary: mmr titer - chart requested  Phone Note Call from Patient Call back at [phone]   Caller: Patient Call For: STAFFORD Summary of Call: NEED TO KNOW IF WE HAVE A COPY OF HER MMR TITER NEED FOR WORK Initial call taken by: Barnie Mort,  April 22, 2007 2:00 PM  Follow-up for Phone Call        Nothing on computer profile includes MMR titer.  Chart requested to check there. Follow-up by: Rudy Jew, RN,  April 22, 2007 2:05 PM  Additional Follow-up for Phone Call Additional follow up Details #1::        None found back through 2005.  Call to patient. Additional Follow-up by: Rudy Jew, RN,  April 22, 2007 5:30 PM    Additional Follow-up for Phone Call Additional follow up Details #2::    Call pt and tell her insuurance may not pay for titers if none in chart will need to do MMR titer if wants it done although emlployer may or may not pay for it Follow-up by: [name] MD,  April 22, 2007 5:34 PM  Additional Follow-up for Phone Call Additional follow up Details #3:: Details for Additional Follow-up Action Taken: Patient advised. Additional Follow-up by: Rudy Jew, RN,  April 22, 2007 5:40 PM

## 2010-10-03 NOTE — Assessment & Plan Note (Signed)
Summary: peeing blood/jls  Medications Added CIPROFLOXACIN HCL 500 MG  TABS (CIPROFLOXACIN HCL) 1 two times a day for uti ENDOCET 10-650 MG  TABS (OXYCODONE-ACETAMINOPHEN) 1 q4-6 hr as needed severe pain        Vital Signs:  Patient Profile:   55 Years Old Female Weight:      114 pounds Temp:     98.7 degrees F Pulse rate:   95 / minute BP sitting:   110 / 72 Cuff size:   regular                 Chief Complaint:  pain burning and hemauria .  History of Present Illness: Pt had gross hematuria and dysuria this AM. Also haS NOTICED SOME DISCOMFORT OVER LEFT cVA REGION FOR PAST FEW DAYS. no vaginal discharge , no sex last PM increased frequency over past 3-4 days. Has not  een a recurrant problem No other problems except5 fibromyalgia has been rather constant in past weeks Continues to have tension headaches, relieve with Esgic, not over 4 per day Dr. Charlett Blake request that I give a rx for percocet to alternate with hydrocodone    Current Allergies: ! NSAIDS DEPAKOTE (DIVALPROEX SODIUM)     Review of Systems      See HPI   Physical Exam  General:     Well-developed,well-nourished,in no acute distress; alert,appropriate and cooperative throughout examination Lungs:     Normal respiratory effort, chest expands symmetrically. Lungs are clear to auscultation, no crackles or wheezes. Heart:     Normal rate and regular rhythm. S1 and S2 normal without gallop, murmur, click, rub or other extra sounds. Abdomen:     tenderness over bladder, tenderness left CVA Genitalia:     NE, recent Gyn exam Msk:     No deformity or scoliosis noted of thoracic or lumbar spine.   Extremities:     No clubbing, cyanosis, edema, or deformity noted with normal full range of motion of all joints.      Complete Medication List: 1)  Lunesta 3 Mg Tabs (Eszopiclone) .... One at bedtime 2)  Hydrocodone-acetaminophen 5-500 Mg Tabs (Hydrocodone-acetaminophen) .... Take 1 tablet by mouth every  four to six hours 3)  Prempro 0.625-2.5 Mg Tabs (Conj estrog-medroxyprogest ace) .... Take 1 tablet by mouth once a day 4)  Zomig 5 Mg Tabs (Zolmitriptan) .... Take 1 tablet at onset of headaches 5)  Adult Aspirin Low Strength 81 Mg Tbdp (Aspirin) .Marland Kitchen.. 1 by mouth once daily 6)  Diazepam 5 Mg Tabs (Diazepam) .... As needed 7)  Cymbalta 20 Mg Cpep (Duloxetine hcl) .Marland Kitchen.. 1 by mouth once daily 8)  Protonix 40 Mg Tbec (Pantoprazole sodium) .Marland Kitchen.. 1 by mouth once daily 9)  Zyrtec Allergy 10 Mg Tabs (Cetirizine hcl) .Marland Kitchen.. 1 by mouth once daily 10)  Prednisone 20 Mg Tabs (Prednisone) .Marland Kitchen.. 1 by mouth once daily 11)  Crestor 10 Mg Tabs (Rosuvastatin calcium) .Marland Kitchen.. 1 by mouth once daily 12)  Hycodan 5-1.5 Mg/40ml Syrp (Hydrocodone-homatropine) .Marland Kitchen.. 1 tsp every 4 hours as needed cough 13)  Esgic-plus 50-500-40 Mg Tabs (Butalbital-apap-caffeine) .Marland Kitchen.. 1 every 4-6 hrs as needed  pain not to exceed 4  day 14)  Ciprofloxacin Hcl 500 Mg Tabs (Ciprofloxacin hcl) .Marland Kitchen.. 1 two times a day for uti 15)  Endocet 10-650 Mg Tabs (Oxycodone-acetaminophen) .Marland Kitchen.. 1 q4-6 hr as needed severe pain   Patient Instructions: 1)  increase fluid intake 2)  cipro 500 two times a day 3)  to culture urine  4)  Please schedule a follow-up appointment in 2 weeks.    Prescriptions: ENDOCET 10-650 MG  TABS (OXYCODONE-ACETAMINOPHEN) 1 q4-6 hr as needed severe pain  #100 x 0   Entered and Authorized by:   [name] MD   Signed by:   [name] MD on 10/14/2007   Method used:   Print then Give to Patient   RxID:   [id number] ESGIC-PLUS 50-500-40 MG  TABS (BUTALBITAL-APAP-CAFFEINE) 1 every 4-6 hrs as needed  pain not to exceed 4  day  #120 x 5   Entered and Authorized by:   [name] MD   Signed by:   [name] MD on 10/14/2007   Method used:   Print then Give to Patient   RxID:   [id number] CIPROFLOXACIN HCL 500 MG  TABS (CIPROFLOXACIN HCL) 1 two times a day for uti  #30 x 1    Entered and Authorized by:   [name] MD   Signed by:   [name] MD on 10/14/2007   Method used:   Electronically sent to ...       [name]       [address]       [place], [place]  [address]       Ph: [phone] or [phone]       Fax: [phone]   RxID:   [id number]  ]  Appended Document: peeing blood/jls  Laboratory Results   Urine Tests    Routine Urinalysis   Color: yellow Appearance: Clear Glucose: negative   (Normal Range: Negative) Bilirubin: 1+   (Normal Range: Negative) Ketone: trace (5)   (Normal Range: Negative) Spec. Gravity: 1.020   (Normal Range: 1.003-1.035) Blood: 3+   (Normal Range: Negative) pH: 5.0   (Normal Range: 5.0-8.0) Protein: 1+   (Normal Range: Negative) Urobilinogen: negative   (Normal Range: 0-1) Nitrite: negative   (Normal Range: Negative) Leukocyte Esterace: 3+   (Normal Range: Negative)    Comments: ...................................................................Milica Zimonjic  October 14, 2007 3:24 PM per dr [name] --was tx.

## 2010-10-03 NOTE — Assessment & Plan Note (Signed)
Summary: cough/?sinus/cjr   Vital Signs:  Patient profile:   55 year old female Menstrual status:  postmenopausal Height:      65.5 inches (166.37 cm) Weight:      118 pounds (53.64 kg) O2 Sat:      93 % on Room air Temp:     98.0 degrees F (36.67 degrees C) oral Pulse rate:   100 / minute BP sitting:   110 / 70  (left arm) Cuff size:   regular  Vitals Entered By: Josph Macho RMA (June 27, 2010 10:49 AM)  O2 Flow:  Room air CC: Cough w/ phlegm (yellow)/ sinus X1 week/ CF Is Patient Diabetic? No   History of Present Illness: patient is a 55 year old Caucasian female, a smoker. She smokes roughly one pack per day and has been sick for approximately one week. She is complaining of sinus pressure rhinorrhea productive of yellow sputum.low-grade myalgias low-grade diarrhea and some intermittent mild left lower quadrant pain. denies ear pain or sore throat. Denies nausea and vomiting. Denies chest pain, palpitations, shortness of breath. She took 2 days of doxycycline over the weekend she had had diarrhea so she stopped it. She has been using Mucinex DM with partial effect and reports that the worst of her sinus pressure over her maxillary sinuses. Denies GU complaints, anorexia or other concerns today  Allergies: 1)  ! Nsaids 2)  Depakote (Divalproex Sodium)  Past History:  Past medical history reviewed for relevance to current acute and chronic problems. Social history (including risk factors) reviewed for relevance to current acute and chronic problems.  Past Medical History: Reviewed history from 05/09/2007 and no changes required. Peptic Ulcer MVP Heart Murmur Hepatitis/Jaundice High Cholesterol Migraines Headache  Social History: Reviewed history from 01/20/2008 and no changes required.  Married Current Smoker Alcohol use-yes Drug use-no Regular exercise-yes  Review of Systems      See HPI  Physical Exam  General:  Well-developed,well-nourished,in no  acute distress; alert,appropriate and cooperative throughout examination Head:  Normocephalic and atraumatic without obvious abnormalities. No apparent alopecia or balding. Nose:  External nasal examination shows no deformity or inflammation. Nasal mucosa are pink and moist without lesions or exudates. Mouth:  Oral mucosa and oropharynx without lesions or exudates.  Teeth in good repair. mild erythema posterior oropharynx Neck:  No deformities, masses, or tenderness noted. Lungs:  Normal respiratory effort, chest expands symmetrically. Lungs are clear to auscultation, no crackles or wheezes. Heart:  Normal rate and regular rhythm. S1 and S2 normal without gallop, murmur, click, rub or other extra sounds. Abdomen:  Bowel sounds positive,abdomen soft and non-tender without masses, organomegaly or hernias noted. Extremities:  No clubbing, cyanosis, edema, or deformity noted with normal full range of motion of all joints.   Cervical Nodes:  No lymphadenopathy noted Psych:  Cognition and judgment appear intact. Alert and cooperative with normal attention span and concentration. No apparent delusions, illusions, hallucinations   Impression & Recommendations:  Problem # 1:  SINUSITIS, ACUTE (ICD-461.9)  Her updated medication list for this problem includes:    Amoxicillin 500 Mg Caps (Amoxicillin) .Marland Kitchen... 1 three times a day for bronchitis    Cefdinir 300 Mg Caps (Cefdinir) .Marland Kitchen... 1 caps by mouth two times a day x 10 days    Tussionex Pennkinetic Er 10-8 Mg/65ml Lqcr (Hydrocod polst-chlorphen polst) .Marland Kitchen... 1 tsp by mouth at bedtime as needed cough    Mucinex 600 Mg Xr12h-tab (Guaifenesin) .Marland Kitchen... 1 tab by mouth two times a day x 10 days  Push clear fluids  Problem # 2:  TOBACCO USER (ICD-305.1)  Orders: Tobacco use cessation intermediate 3-10 minutes (09811) Patient still somewhat noncommital about quitting will check with her pharmacist to find out the cost of Nicotrol inhalers and will call if she  wants an rx  Problem # 3:  GERD (ICD-530.81)  Her updated medication list for this problem includes:    Protonix 40 Mg Tbec (Pantoprazole sodium) .Marland Kitchen... 1 by mouth once daily No c/o today  Complete Medication List: 1)  Prempro 0.625-2.5 Mg Tabs (Conj estrog-medroxyprogest ace) .... Take 1 tablet by mouth once a day 2)  Zomig 5 Mg Tabs (Zolmitriptan) .... Take 1 tablet at onset of headaches 3)  Adult Aspirin Low Strength 81 Mg Tbdp (Aspirin) .Marland Kitchen.. 1 by mouth once daily 4)  Protonix 40 Mg Tbec (Pantoprazole sodium) .Marland Kitchen.. 1 by mouth once daily 5)  Zyrtec Allergy 10 Mg Tabs (Cetirizine hcl) .Marland Kitchen.. 1 by mouth once daily 6)  Crestor 10 Mg Tabs (Rosuvastatin calcium) .Marland Kitchen.. 1 by mouth once daily 7)  Esgic-plus 50-500-40 Mg Tabs (Butalbital-apap-caffeine) .Marland Kitchen.. 1 every 4-6 hrs as needed  pain not to exceed 4  day 8)  Hydrocodone-acetaminophen 10-325 Mg Tabs (Hydrocodone-acetaminophen) .Marland Kitchen.. 1 every 4-6 hrsp as needed pain. not to exceed 4 per day. 9)  Lyrica 25 Mg Caps (Pregabalin) .Marland Kitchen.. 1 qid 10)  Ambien 10 Mg Tabs (Zolpidem tartrate) .Marland Kitchen.. 1 by mouth hs as needed sleep 11)  Parafon Forte Dsc 500 Mg Tabs (Chlorzoxazone) .Marland Kitchen.. 1 by mouth four times a day  with meals 12)  Cymbalta 30 Mg Cpep (Duloxetine hcl) .Marland Kitchen.. 1 tid 13)  Lovaza 1 Gm Caps (Omega-3-acid ethyl esters) .Marland Kitchen.. 1 once daily two times a day 14)  Cyclobenzaprine Hcl 10 Mg Tabs (Cyclobenzaprine hcl) .Marland Kitchen.. 1 by mouth three times a day for muscle spasms 15)  Amoxicillin 500 Mg Caps (Amoxicillin) .Marland Kitchen.. 1 three times a day for bronchitis 16)  Cefdinir 300 Mg Caps (Cefdinir) .Marland Kitchen.. 1 caps by mouth two times a day x 10 days 17)  Tussionex Pennkinetic Er 10-8 Mg/4ml Lqcr (Hydrocod polst-chlorphen polst) .Marland Kitchen.. 1 tsp by mouth at bedtime as needed cough 18)  Mucinex 600 Mg Xr12h-tab (Guaifenesin) .Marland Kitchen.. 1 tab by mouth two times a day x 10 days 19)  Align Caps (Probiotic product) .Marland Kitchen.. 1 cap by mouth daily  Patient Instructions: 1)  Take your antibiotic as prescribed  until ALL of it is gone, but stop if you develop a rash or swelling and contact our office as soon as possible.  2)  Acute sinusitis symptoms for less than 10 days are not helped by antibiotics. Use warm moist compresses, and over the counter decongestants( only as directed). Call if no improvement in 5-7 days, sooner if increasing pain, fever, or new symptoms.  3)  Consider Nicotrol Inhaler ask pharmacist about coverage Prescriptions: TUSSIONEX PENNKINETIC ER 10-8 MG/5ML LQCR (HYDROCOD POLST-CHLORPHEN POLST) 1 tsp by mouth at bedtime as needed cough  #4oz x 1   Entered and Authorized by:   Danise Edge MD   Signed by:   Danise Edge MD on 06/27/2010   Method used:   Print then Give to Patient   RxID:   [id number] CEFDINIR 300 MG CAPS (CEFDINIR) 1 caps by mouth two times a day x 10 days  #20 x 0   Entered and Authorized by:   Danise Edge MD   Signed by:   Danise Edge MD on 06/27/2010   Method used:   Electronically to  [facility] [address]* (retail)       [address]       [place], [place]  [address]       Ph: [phone]       Fax: [phone]   RxID:   [id number]    Orders Added: 1)  Tobacco use cessation intermediate 3-10 minutes [99406] 2)  Est. Patient Level IV [84132]   Immunization History:  Influenza Immunization History:    Influenza:  historical (05/04/2010)   Immunization History:  Influenza Immunization History:    Influenza:  Historical (05/04/2010)

## 2010-10-03 NOTE — Consult Note (Signed)
Summary: [facility]   Imported By: Kassie Mends 01/22/2008 16:54:23  _____________________________________________________________________  External Attachment:    Type:   Image     Comment:   [facility]

## 2010-10-03 NOTE — Progress Notes (Signed)
Summary: refill zolpidem and hydrocodone.   Phone Note From Pharmacy   Caller: [name] [name] [phone]* Reason for Call: Needs renewal Summary of Call: renew hydrocodoen 10/325  and zolpdidem  Initial call taken by: Pura Spice, RN,  Jan 04, 2009 4:00 PM  Follow-up for Phone Call        pt has ov in july 2010  and ok per dr Burnetta Sabin to refill zolpidem and hydrocodone.  Follow-up by: Pura Spice, RN,  Jan 04, 2009 4:00 PM    New/Updated Medications: HYDROCODONE-ACETAMINOPHEN 10-325 MG  TABS (HYDROCODONE-ACETAMINOPHEN) 1 every 4-6 hrsp as needed pain.   Prescriptions: HYDROCODONE-ACETAMINOPHEN 10-325 MG  TABS (HYDROCODONE-ACETAMINOPHEN) 1 every 4-6 hrsp as needed pain.  #100 x 3   Entered by:   Pura Spice, RN   Authorized by:   [name] MD   Signed by:   Pura Spice, RN on 01/04/2009   Method used:   Printed then faxed to ...       [name]* (retail)       [address]       [place], [place]  [address]       Ph: [phone] or [phone]       Fax: [phone]   RxID:   [id number]

## 2010-10-03 NOTE — Progress Notes (Signed)
Summary: rx esgic plus with 3 refills  Phone Note Call from Patient   Caller: Patient Summary of Call: called requesting refill esgic plus. had hydrocodone and zolpiedem refilled already.  Initial call taken by: Pura Spice, RN,  Jan 05, 2009 2:05 PM  Follow-up for Phone Call        per dr [name] ok to call in esgic plus that she uses for breakdown  pain and called in [facility].  Follow-up by: Pura Spice, RN,  Jan 05, 2009 2:08 PM      Prescriptions: ESGIC-PLUS 50-500-40 MG  TABS (BUTALBITAL-APAP-CAFFEINE) 1 every 4-6 hrs as needed  pain not to exceed 4  day  #120 x 3   Entered by:   Pura Spice, RN   Authorized by:   [name] MD   Signed by:   Pura Spice, RN on 01/05/2009   Method used:   Telephoned to ...       [name] (retail)       [address]       [place], [place]  [address]       Ph: [phone] or [phone]       Fax: [phone]   RxID:   [id number]

## 2010-10-03 NOTE — Progress Notes (Signed)
Summary: refill hydrocodone prpro and esgic plus  Medications Added HYDROCODONE-ACETAMINOPHEN 5-500 MG TABS (HYDROCODONE-ACETAMINOPHEN) Take 1 tablet by mouth every four to six hours ESGIC-PLUS 50-500-40 MG  TABS (BUTALBITAL-APAP-CAFFEINE) 1 every 4-6 hrs as needed  pain not to exceed 4  day       Phone Note Call from Patient Call back at [facility] Phone [phone]   Caller: Patient Call For: dr [name] Summary of Call: pt needs refill on prempro/  esgic plus and vicodin [phone]. pt is aware dr [name] not in office today. pt is out of prempro. Initial call taken by: Heron Sabins,  September 22, 2007 9:26 AM    New/Updated Medications: HYDROCODONE-ACETAMINOPHEN 5-500 MG TABS (HYDROCODONE-ACETAMINOPHEN) Take 1 tablet by mouth every four to six hours ESGIC-PLUS 50-500-40 MG  TABS (BUTALBITAL-APAP-CAFFEINE) 1 every 4-6 hrs as needed  pain not to exceed 4  day   Prescriptions: ESGIC-PLUS 50-500-40 MG  TABS (BUTALBITAL-APAP-CAFFEINE) 1 every 4-6 hrs as needed  pain not to exceed 4  day  #60 x 0   Entered by:   Pura Spice, RN   Authorized by:   [name] MD   Signed by:   Pura Spice, RN on 09/22/2007   Method used:   Telephoned to ...       [name]       [address]       [place], [place]  [address]       Ph: [phone] or [phone]       Fax: [phone]   RxID:   [id number] PREMPRO 0.625-2.5 MG TABS (CONJ ESTROG-MEDROXYPROGEST ACE) Take 1 tablet by mouth once a day  #30 x 4   Entered by:   Pura Spice, RN   Authorized by:   [name] MD   Signed by:   Pura Spice, RN on 09/22/2007   Method used:   Telephoned to ...       [name]       [address]       [place], [place]  [address]       Ph: [phone] or [phone]       Fax: [phone]   RxID:   [id number] HYDROCODONE-ACETAMINOPHEN 5-500 MG TABS  (HYDROCODONE-ACETAMINOPHEN) Take 1 tablet by mouth every four to six hours  #120 x 0   Entered by:   Pura Spice, RN   Authorized by:   [name] MD   Signed by:   Pura Spice, RN on 09/22/2007   Method used:   Telephoned to ...       [name]       [address]       [place], [place]  [address]       Ph: [phone] or [phone]       Fax: [phone]   RxID:   [id number]

## 2010-10-03 NOTE — Progress Notes (Signed)
Summary: refill esgic    Phone Note From Pharmacy   Caller: [name] [phone]* Reason for Call: Needs renewal Summary of Call: refill butab  apap  50/500/40  Initial call taken by: Pura Spice, RN,  December 22, 2009 2:38 PM  Follow-up for Phone Call        ok x 3 per dr [name] and called to [facility]  Follow-up by: Pura Spice, RN,  December 22, 2009 2:38 PM    Prescriptions: ESGIC-PLUS 50-500-40 MG  TABS (BUTALBITAL-APAP-CAFFEINE) 1 every 4-6 hrs as needed  pain not to exceed 4  day  #60 x 3   Entered by:   Pura Spice, RN   Authorized by:   [name] MD   Signed by:   Pura Spice, RN on 12/22/2009   Method used:   Telephoned to ...       [name] (retail)       [address]       [place], [place]  [address]       Ph: [phone] or [phone]       Fax: [phone]   RxID:   [id number]   Appended Document: refill esgic   pt called requested  to have # 120 of med per dr Darl Pikes ok. [facility] notified...........gh rn.......Marland Kitchen

## 2010-10-03 NOTE — Progress Notes (Signed)
Summary: refill zolpidem  needs office visit  Phone Note From Pharmacy   Caller: [name] [phone]* Reason for Call: Needs renewal Summary of Call: refill zolidem   Initial call taken by: Pura Spice, RN,  November 08, 2008 2:01 PM  Follow-up for Phone Call        ok x 1 per dr [name] then needs ov.  Follow-up by: Pura Spice, RN,  November 08, 2008 2:01 PM    New/Updated Medications: AMBIEN 10 MG  TABS (ZOLPIDEM TARTRATE) 1 by mouth hs as needed sleep needs office visit.   Prescriptions: AMBIEN 10 MG  TABS (ZOLPIDEM TARTRATE) 1 by mouth hs as needed sleep needs office visit.  #30 x 0   Entered by:   Pura Spice, RN   Authorized by:   [name] MD   Signed by:   Pura Spice, RN on 11/08/2008   Method used:   Telephoned to ...       [name] (retail)       [address]       [place], [place]  [address]       Ph: [phone] or [phone]       Fax: [phone]   RxID:   [id number]

## 2010-10-03 NOTE — Letter (Signed)
Summary: Results Follow-up Letter  [facility]  [address] [place], [place] [address]   Phone: [phone]  Fax: [phone]    04/06/2010  [address] [place], [place]  [address]  Dear Ms. Gullickson,   The following are the results of your recent test(s):  Test     Result     Pap Smear    Normal_______    Sincerely,  Dr Gwenyth Bender Stafford,MD     at [facility]

## 2010-10-03 NOTE — Progress Notes (Signed)
Summary: refill zolpidem 10 mg  w / 5 refills   Phone Note From Pharmacy   Caller: [name] [phone]* Reason for Call: Needs renewal Summary of Call: refill zolpidem 10 mg  Initial call taken by: Pura Spice, RN,  May 25, 2009 11:12 AM  Follow-up for Phone Call        ok w 5 refills per dr [name]  Follow-up by: Pura Spice, RN,  May 25, 2009 11:12 AM    New/Updated Medications: AMBIEN 10 MG  TABS (ZOLPIDEM TARTRATE) 1 by mouth hs as needed sleep Prescriptions: AMBIEN 10 MG  TABS (ZOLPIDEM TARTRATE) 1 by mouth hs as needed sleep  #34 x 5   Entered by:   Pura Spice, RN   Authorized by:   [name] MD   Signed by:   Pura Spice, RN on 05/25/2009   Method used:   Telephoned to ...       [name] (retail)       [address]       [place], [place]  [address]       Ph: [phone] or [phone]       Fax: [phone]   RxID:   [id number]

## 2010-10-03 NOTE — Progress Notes (Signed)
Summary: Patient thinks she is having a reaction/Depo Medrol  Phone Note Call from Patient   Summary of Call: Patient states after getting the Depo Medrol shot she woke up that night with a fever of 102.0. Patient c/o a huge red raised area and extreme itching at the injection site. Patient wondering if she is having a reaction to the Depo Medrol and what to do? Patient can be reached at [phone]. Initial call taken by: Darra Lis RMA,  March 29, 2009 12:24 PM  Follow-up for Phone Call        per dr [name] usually has no reaction but she nneds to apply warm moist compresses 20-30" three times a day and may take antihistamine.  Follow-up by: Pura Spice, RN,  March 29, 2009 3:30 PM  Additional Follow-up for Phone Call Additional follow up Details #1::        Patient informed. Additional Follow-up by: Darra Lis RMA,  March 29, 2009 3:35 PM

## 2010-10-03 NOTE — Consult Note (Signed)
Summary: [facility]  [facility]   Imported By: Lanelle Bal 01/23/2008 11:46:20  _____________________________________________________________________  External Attachment:    Type:   Image     Comment:   External Document

## 2010-10-03 NOTE — Progress Notes (Signed)
Summary:  refill hydrocodone with  5 refills called to [facility]  Phone Note From Pharmacy   Caller: [name] [phone]* Reason for Call: Needs renewal Summary of Call: refill hydrocodone.   Follow-up for Phone Call        ok x 5 per dr Alfonzo Feller med called in.  Follow-up by: Pura Spice, RN,  September 14, 2009 4:26 PM    New/Updated Medications: HYDROCODONE-ACETAMINOPHEN 10-325 MG  TABS (HYDROCODONE-ACETAMINOPHEN) 1 every 4-6 hrsp as needed pain. not to exceed 4 per day. Prescriptions: HYDROCODONE-ACETAMINOPHEN 10-325 MG  TABS (HYDROCODONE-ACETAMINOPHEN) 1 every 4-6 hrsp as needed pain. not to exceed 4 per day.  #100 x 5   Entered by:   Pura Spice, RN   Authorized by:   [name] MD   Signed by:   Pura Spice, RN on 09/14/2009   Method used:   Telephoned to ...       [name] (retail)       [address]       [place], [place]  [address]       Ph: [phone] or [phone]       Fax: [phone]   RxID:   [id number]

## 2010-10-03 NOTE — Progress Notes (Signed)
Summary: appt made for cpx on 7-022  Phone Note Call from Patient Call back at [phone]   Caller: Patient Call For: stafford Summary of Call: she neeeds refill on lunesta 10 mg and vicodan 10 325 mg [facility]  Initial call taken by: Roselle Locus,  Jan 03, 2009 10:47 AM  Follow-up for Phone Call        over 6 months needs office visit.  Follow-up by: Pura Spice, RN,  Jan 04, 2009 10:10 AM  Additional Follow-up for Phone Call Additional follow up Details #1::        PATIENT SCHEDULE APPT FOR 7-22 FOR CPx call at  [phone] if she needs to come in for roa before july Roselle Locus  Jan 04, 2009 11:31 AM    Additional Follow-up for Phone Call Additional follow up Details #2::    Message left for patient to return call. Follow-up by: Darra Lis RMA,  Jan 04, 2009 10:14 AM    Appended Document: appt made for cpx on 7-022 pls let her know she is requesting lunesta and [facility] requesting zolpidem. per dr [name] can not have both . need to know which one she wants   gh rn   Appended Document: appt made for cpx on 7-022 Patient didn't know what it was called. She wants Zolpidem.

## 2010-10-03 NOTE — Progress Notes (Signed)
Summary: refil lzolpidem  w 5 refills   Phone Note From Pharmacy   Caller: [name] [phone]* Reason for Call: Needs renewal Summary of Call: refill  zolpidem  Initial call taken by: Pura Spice, RN,  December 06, 2009 11:03 AM  Follow-up for Phone Call        ok x 5 refills  Follow-up by: Pura Spice, RN,  December 06, 2009 11:03 AM    New/Updated Medications: AMBIEN 10 MG  TABS (ZOLPIDEM TARTRATE) 1 by mouth hs as needed sleep Prescriptions: AMBIEN 10 MG  TABS (ZOLPIDEM TARTRATE) 1 by mouth hs as needed sleep  #34 x 5   Entered by:   Pura Spice, RN   Authorized by:   [name] MD   Signed by:   Pura Spice, RN on 12/06/2009   Method used:   Printed then faxed to ...       [name] (retail)       [address]       [place], [place]  [address]       Ph: [phone] or [phone]       Fax: [phone]   RxID:   [id number]

## 2010-10-03 NOTE — Miscellaneous (Signed)
Summary: refill zomig  Medications Added HYDROCODONE-ACETAMINOPHEN 5-500 MG TABS (HYDROCODONE-ACETAMINOPHEN) Take 1 tablet by mouth every four to six hours PREMPRO 0.625-2.5 MG TABS (CONJ ESTROG-MEDROXYPROGEST ACE) Take 1 tablet by mouth once a day ZOMIG 5 MG  TABS (ZOLMITRIPTAN) take 1 tablet at onset of headaches      Allergies Added: DEPAKOTE (DIVALPROEX SODIUM) Clinical Lists Changes  Medications: Added new medication of HYDROCODONE-ACETAMINOPHEN 5-500 MG TABS (HYDROCODONE-ACETAMINOPHEN) Take 1 tablet by mouth every four to six hours Added new medication of PREMPRO 0.625-2.5 MG TABS (CONJ ESTROG-MEDROXYPROGEST ACE) Take 1 tablet by mouth once a day Added new medication of ZOMIG 5 MG  TABS (ZOLMITRIPTAN) take 1 tablet at onset of headaches - Signed Rx of ZOMIG 5 MG  TABS (ZOLMITRIPTAN) take 1 tablet at onset of headaches;  #6 x 1;  Signed;  Entered by: Pura Spice, RN;  Authorized by: Pura Spice, RN;  Method used: Print then Give to Patient Rx of ZOMIG 5 MG  TABS (ZOLMITRIPTAN) take 1 tablet at onset of headaches;  #6 x 1;  Signed;  Entered by: Pura Spice, RN;  Authorized by: [name] MD;  Method used: Print then Give to Patient Allergies: Added new allergy or adverse reaction of DEPAKOTE (DIVALPROEX SODIUM) Observations: Added new observation of LLIMPORTALLS: completed (04/09/2007 13:28) Added new observation of NKA: F (04/09/2007 13:28) Added new observation of LLIMPORTMEDS: completed (04/09/2007 13:28)    Prescriptions: ZOMIG 5 MG  TABS (ZOLMITRIPTAN) take 1 tablet at onset of headaches  #6 x 1   Entered by:   Pura Spice, RN   Authorized by:   [name] MD   Signed by:   Pura Spice, RN on 04/09/2007   Method used:   Print then Give to Patient   RxID:   [id number] ZOMIG 5 MG  TABS (ZOLMITRIPTAN) take 1 tablet at onset of headaches  #6 x 1   Entered and Authorized by:   Pura Spice, RN   Signed by:   Pura Spice, RN on  04/09/2007   Method used:   Print then Give to Patient   RxID:   [id number]   Appended Document: refill zomig zomig 74m # 6 with one refill signed in error by me but rx signed by Dr Cheree Ditto

## 2010-10-03 NOTE — Progress Notes (Signed)
Summary: rx cymbalta   Phone Note From Pharmacy   Caller: [address] Dr. [phone]* Reason for Call: Needs renewal Summary of Call: 90 day supply cymbalta 30 mg  Initial call taken by: Pura Spice, RN,  July 27, 2009 9:03 AM  Follow-up for Phone Call        ok by dr [name] and faxed over Follow-up by: Pura Spice, RN,  July 27, 2009 9:04 AM    New/Updated Medications: CYMBALTA 30 MG CPEP (DULOXETINE HCL) 1 by mouth three times a day Prescriptions: CYMBALTA 30 MG CPEP (DULOXETINE HCL) 1 by mouth three times a day  #270 x 3   Entered by:   Pura Spice, RN   Authorized by:   [name] MD   Signed by:   Pura Spice, RN on 07/27/2009   Method used:   Printed then faxed to ...       [name] (retail)       [address]       [place], [place]  [address]       Ph: [phone] or [phone]       Fax: [phone]   RxID:   [id number]

## 2010-10-03 NOTE — Assessment & Plan Note (Signed)
Summary: ABD PAIN/NJR   Vital Signs:  Patient Profile:   55 Years Old Female Weight:      115 pounds Temp:     95 degrees F Pulse rate:   90 / minute BP sitting:   104 / 70  (left arm) Cuff size:   regular  Vitals Entered By: Pura Spice, RN (Jan 20, 2008 2:17 PM)                 Chief Complaint:  rt upper abd pain -hx cholectectomy had bladder surg 4 wks ago by dr Manon Hilding.stated takes [place] but don't know strngth.  History of Present Illness: Pt complains of pain RUQ abdomen but points to chest wall, relates pain radiates from back to anterior chest wall or frm front to back. not related to eating but to movement, no nausea nor vomiting. Had bladder surgery 4 weeks ago by Dr. Isabel Caprice, dilated urethra and instillation for interstial cystitis complains of fibromyalgia and is taking lyrica 50 mg hs needs refill percocet for fibromyalgia, uses infrequently Pt has had cholocystectomy     Current Allergies (reviewed today): ! NSAIDS DEPAKOTE (DIVALPROEX SODIUM)   Social History:        Married    Current Smoker    Alcohol use-yes    Drug use-no    Regular exercise-yes    Review of Systems      See HPI   Physical Exam  General:     Well-developed,well-nourished,in no acute distress; alert,appropriate and cooperative throughout examination Chest Wall:     minimal chostochondral tenderness but minimal Lungs:     Normal respiratory effort, chest expands symmetrically. Lungs are clear to auscultation, no crackles or wheezes. Heart:     Normal rate and regular rhythm. S1 and S2 normal without gallop, murmur, click, rub or other extra sounds. Abdomen:     Bowel sounds positive,abdomen soft and non-tender without masses, organomegaly or hernias noted. Msk:     tenderness rt thoracic spine and very tender with radiation of pain to rib cage, trigger area    Impression & Recommendations:  Problem # 1:  CHEST PAIN (ICD-786.50) Assessment: New  Problem # 2:   FIBROMYALGIA (ICD-729.1) Assessment: Unchanged  Her updated medication list for this problem includes:    Adult Aspirin Low Strength 81 Mg Tbdp (Aspirin) .Marland Kitchen... 1 by mouth once daily    Esgic-plus 50-500-40 Mg Tabs (Butalbital-apap-caffeine) .Marland Kitchen... 1 every 4-6 hrs as needed  pain not to exceed 4  day    Endocet 10-650 Mg Tabs (Oxycodone-acetaminophen) .Marland Kitchen... 1 q4-6 hr as needed severe pain    Hydrocodone-acetaminophen 10-325 Mg Tabs (Hydrocodone-acetaminophen) .Marland Kitchen... 1 every 4-6 hrsp as needed pain.    Flexeril 10 Mg Tabs (Cyclobenzaprine hcl) .Marland Kitchen... 1 by mouth three times a day as needed muscle spasms   Problem # 3:  HEADACHE (ICD-784.0) Assessment: Improved  Her updated medication list for this problem includes:    Zomig 5 Mg Tabs (Zolmitriptan) .Marland Kitchen... Take 1 tablet at onset of headaches    Adult Aspirin Low Strength 81 Mg Tbdp (Aspirin) .Marland Kitchen... 1 by mouth once daily    Esgic-plus 50-500-40 Mg Tabs (Butalbital-apap-caffeine) .Marland Kitchen... 1 every 4-6 hrs as needed  pain not to exceed 4  day    Endocet 10-650 Mg Tabs (Oxycodone-acetaminophen) .Marland Kitchen... 1 q4-6 hr as needed severe pain    Hydrocodone-acetaminophen 10-325 Mg Tabs (Hydrocodone-acetaminophen) .Marland Kitchen... 1 every 4-6 hrsp as needed pain.   Complete Medication List: 1)  Prempro 0.625-2.5 Mg Tabs (  Conj estrog-medroxyprogest ace) .... Take 1 tablet by mouth once a day 2)  Zomig 5 Mg Tabs (Zolmitriptan) .... Take 1 tablet at onset of headaches 3)  Adult Aspirin Low Strength 81 Mg Tbdp (Aspirin) .Marland Kitchen.. 1 by mouth once daily 4)  Diazepam 5 Mg Tabs (Diazepam) .... As needed 5)  Cymbalta 20 Mg Cpep (Duloxetine hcl) .Marland Kitchen.. 1 by mouth once daily 6)  Protonix 40 Mg Tbec (Pantoprazole sodium) .Marland Kitchen.. 1 by mouth once daily 7)  Zyrtec Allergy 10 Mg Tabs (Cetirizine hcl) .Marland Kitchen.. 1 by mouth once daily 8)  Crestor 10 Mg Tabs (Rosuvastatin calcium) .Marland Kitchen.. 1 by mouth once daily 9)  Hycodan 5-1.5 Mg/30ml Syrp (Hydrocodone-homatropine) .Marland Kitchen.. 1 tsp every 4 hours as needed cough 10)   Esgic-plus 50-500-40 Mg Tabs (Butalbital-apap-caffeine) .Marland Kitchen.. 1 every 4-6 hrs as needed  pain not to exceed 4  day 11)  Ciprofloxacin Hcl 500 Mg Tabs (Ciprofloxacin hcl) .Marland Kitchen.. 1 two times a day for uti 12)  Endocet 10-650 Mg Tabs (Oxycodone-acetaminophen) .Marland Kitchen.. 1 q4-6 hr as needed severe pain 13)  Hydrocodone-acetaminophen 10-325 Mg Tabs (Hydrocodone-acetaminophen) .Marland Kitchen.. 1 every 4-6 hrsp as needed pain. 14)  Lyrica 25 Mg Caps (Pregabalin) .... Once daily 15)  Flexeril 10 Mg Tabs (Cyclobenzaprine hcl) .Marland Kitchen.. 1 by mouth three times a day as needed muscle spasms  Osteoporosis Risk Assessment:  Risk Factors for Fracture or Low Bone Density:   Race (White or Asian):     yes   Smoking status:       current  Immunization & Chemoprophylaxis:    MMR vaccine (#3): Historical  (06/30/2007)    Tetanus vaccine: Historical  (09/03/1998)    Influenza vaccine: Historical  (06/30/2007)   Patient Instructions: 1)  take prednisone  10mg  once daily x3 then 5mg  once daily x 3 2)  start flexeril 10 mg three times a day 3)  continue other medications   Prescriptions: FLEXERIL 10 MG  TABS (CYCLOBENZAPRINE HCL) 1 by mouth three times a day as needed muscle spasms  #90 x 5   Entered by:   Pura Spice, RN   Authorized by:   [name] MD   Signed by:   Pura Spice, RN on 01/20/2008   Method used:   Electronically sent to ...       [name] [address]*       [address]       [place], [place]  [address]       Ph: [phone] or [phone]       Fax: [phone]   RxID:   [id number]  ]

## 2010-10-03 NOTE — Progress Notes (Signed)
Summary: Requesting Diazepam   Phone Note Call from Patient   Summary of Call: Patient requesting refill for Diazapam. Patient states she thought Dr.Stafford was going to fill this for her when she was in the other day but when she got to the pharmacy it was not there. Pharm/[facility]. Initial call taken by: Darra Lis RMA,  March 30, 2009 1:32 PM  Follow-up for Phone Call        dr staffrod left her mess and diazepam called in by dr [name]. Follow-up by: Pura Spice, RN,  March 30, 2009 2:29 PM

## 2010-10-03 NOTE — Progress Notes (Signed)
Summary: rx hydrocodone 0 refills needs ov  Phone Note From Pharmacy   Caller: Madilyn Hook Dr. [phone]* Reason for Call: Needs renewal Summary of Call: hydrocodone 10/325  Initial call taken by: Pura Spice, RN,  September 09, 2008 1:10 PM  Follow-up for Phone Call        ok'd per dr [name] this time only needs ov  Follow-up by: Pura Spice, RN,  September 09, 2008 1:11 PM    New/Updated Medications: HYDROCODONE-ACETAMINOPHEN 10-325 MG  TABS (HYDROCODONE-ACETAMINOPHEN) 1 every 4-6 hrsp as needed pain. needs office visit prior to refills   Prescriptions: HYDROCODONE-ACETAMINOPHEN 10-325 MG  TABS (HYDROCODONE-ACETAMINOPHEN) 1 every 4-6 hrsp as needed pain. needs office visit prior to refills  #100 x 0   Entered by:   Pura Spice, RN   Authorized by:   [name] MD   Signed by:   Pura Spice, RN on 09/09/2008   Method used:   Telephoned to ...       [name] [address]* (retail)       [address]       [place], [place]  [address]       Ph: [phone] or [phone]       Fax: [phone]   RxID:   [id number]

## 2010-10-03 NOTE — Progress Notes (Signed)
Summary: Pt req 5 day supply of Cymbalta-[facility].Pt out of med  Phone Note Refill Request Call back at [phone] cell Message from:  Patient on July 05, 2010 4:20 PM  Refills Requested: Medication #1:  CYMBALTA 30 MG CPEP 1 tid   Dosage confirmed as above?Dosage Confirmed Pt called and was told by [facility], that her Cymbalta shipping is delayed and that pt would need to req a 5 day supply called in to [facility] on [address], to last until mail order arrives. Pt says she is completely out of medication and would like this 5 day supply called in today.      Method Requested: Telephone to Pharmacy Initial call taken by: Lucy Antigua,  July 05, 2010 4:20 PM  Follow-up for Phone Call        oK to send 5 day supply as patient requests, #15 Follow-up by: Danise Edge MD,  July 05, 2010 8:03 PM    Prescriptions: CYMBALTA 30 MG CPEP (DULOXETINE HCL) 1 tid  #5 x 0   Entered by:   Josph Macho RMA   Authorized by:   Danise Edge MD   Signed by:   Josph Macho RMA on 07/06/2010   Method used:   Faxed to ...       [facility] (retail)       [address] [place], [place]  [address]       Ph: [phone]       Fax: [phone]   RxID:   [id number]   Appended Document: Pt req 5 day supply of Cymbalta-[facility].Pt out of med Left message on pts cell phone stating 5 day supply was called into [facility].

## 2010-10-03 NOTE — Assessment & Plan Note (Signed)
Summary: ?sinus inf/njr  Medications Added ADULT ASPIRIN LOW STRENGTH 81 MG  TBDP (ASPIRIN) 1 by mouth once daily DIAZEPAM 5 MG  TABS (DIAZEPAM) as needed CYMBALTA 20 MG  CPEP (DULOXETINE HCL) 1 by mouth once daily PROTONIX 40 MG  TBEC (PANTOPRAZOLE SODIUM) 1 by mouth once daily ZYRTEC ALLERGY 10 MG  TABS (CETIRIZINE HCL) 1 by mouth once daily PREDNISONE 20 MG  TABS (PREDNISONE) 1 by mouth once daily CRESTOR 10 MG  TABS (ROSUVASTATIN CALCIUM) 1 by mouth once daily ZITHROMAX Z-PAK 250 MG  TABS (AZITHROMYCIN) as directed HYCODAN 5-1.5 MG/5ML  SYRP (HYDROCODONE-HOMATROPINE) 1 tsp every 4 hours as needed cough        Vital Signs:  Patient Profile:   55 Years Old Female Weight:      115 pounds Temp:     99.1 degrees F oral Pulse rate:   123 / minute BP sitting:   118 / 72  (left arm) Cuff size:   regular  Vitals Entered By: Alfred Levins, CMA (July 03, 2007 8:53 AM)                 Chief Complaint:  cough and drainage.  History of Present Illness: 3 days of sinus pressure, HA, fever, and dry cough. On Mucinex D.  Current Allergies: ! NSAIDS DEPAKOTE (DIVALPROEX SODIUM)     Review of Systems      See HPI   Physical Exam  General:     Well-developed,well-nourished,in no acute distress; alert,appropriate and cooperative throughout examination Head:     Normocephalic and atraumatic without obvious abnormalities. No apparent alopecia or balding. Eyes:     No corneal or conjunctival inflammation noted. EOMI. Perrla. Funduscopic exam benign, without hemorrhages, exudates or papilledema. Vision grossly normal. Ears:     External ear exam shows no significant lesions or deformities.  Otoscopic examination reveals clear canals, tympanic membranes are intact bilaterally without bulging, retraction, inflammation or discharge. Hearing is grossly normal bilaterally. Nose:     External nasal examination shows no deformity or inflammation. Nasal mucosa are pink and moist  without lesions or exudates. Mouth:     Oral mucosa and oropharynx without lesions or exudates.  Teeth in good repair. Neck:     No deformities, masses, or tenderness noted. Lungs:     Normal respiratory effort, chest expands symmetrically. Lungs are clear to auscultation, no crackles or wheezes.    Impression & Recommendations:  Problem # 1:  SINUSITIS, ACUTE NOS (ICD-461.9)  Her updated medication list for this problem includes:    Zithromax Z-pak 250 Mg Tabs (Azithromycin) .Marland Kitchen... As directed    Hycodan 5-1.5 Mg/46ml Syrp (Hydrocodone-homatropine) .Marland Kitchen... 1 tsp every 4 hours as needed cough   Complete Medication List: 1)  Lunesta 3 Mg Tabs (Eszopiclone) .... One at bedtime 2)  Hydrocodone-acetaminophen 5-500 Mg Tabs (Hydrocodone-acetaminophen) .... Take 1 tablet by mouth every four to six hours 3)  Prempro 0.625-2.5 Mg Tabs (Conj estrog-medroxyprogest ace) .... Take 1 tablet by mouth once a day 4)  Zomig 5 Mg Tabs (Zolmitriptan) .... Take 1 tablet at onset of headaches 5)  Adult Aspirin Low Strength 81 Mg Tbdp (Aspirin) .Marland Kitchen.. 1 by mouth once daily 6)  Diazepam 5 Mg Tabs (Diazepam) .... As needed 7)  Cymbalta 20 Mg Cpep (Duloxetine hcl) .Marland Kitchen.. 1 by mouth once daily 8)  Protonix 40 Mg Tbec (Pantoprazole sodium) .Marland Kitchen.. 1 by mouth once daily 9)  Zyrtec Allergy 10 Mg Tabs (Cetirizine hcl) .Marland Kitchen.. 1 by mouth once daily 10)  Prednisone 20 Mg Tabs (Prednisone) .Marland Kitchen.. 1 by mouth once daily 11)  Crestor 10 Mg Tabs (Rosuvastatin calcium) .Marland Kitchen.. 1 by mouth once daily 12)  Zithromax Z-pak 250 Mg Tabs (Azithromycin) .... As directed 13)  Hycodan 5-1.5 Mg/54ml Syrp (Hydrocodone-homatropine) .Marland Kitchen.. 1 tsp every 4 hours as needed cough   Patient Instructions: 1)  Please schedule a follow-up appointment as needed.    Prescriptions: HYCODAN 5-1.5 MG/5ML  SYRP (HYDROCODONE-HOMATROPINE) 1 tsp every 4 hours as needed cough  #240 ml x 0   Entered and Authorized by:   Nelwyn Salisbury MD   Signed by:   Nelwyn Salisbury  MD on 07/03/2007   Method used:   Print then Give to Patient   RxID:   [id number] JYNWGNFAO Z-PAK 250 MG  TABS (AZITHROMYCIN) as directed  #1 x 0   Entered and Authorized by:   Nelwyn Salisbury MD   Signed by:   Nelwyn Salisbury MD on 07/03/2007   Method used:   Print then Give to Patient   RxID:   [id number]  ]  MMR Immunization History:    MMR # 3:  Historical (06/30/2007)  Influenza Immunization History:    Influenza # 1:  Historical (06/30/2007)

## 2010-10-03 NOTE — Assessment & Plan Note (Signed)
Summary: cough/fever/njr  Medications Added ZITHROMAX Z-PAK 250 MG  TABS (AZITHROMYCIN) as directed        Vital Signs:  Patient Profile:   55 Years Old Female Weight:      113 pounds Temp:     98.8 degrees F oral Pulse rate:   114 / minute BP sitting:   112 / 70  (left arm) Cuff size:   regular  Vitals Entered By: Alfred Levins, CMA (September 09, 2007 10:43 AM)                 Chief Complaint:  Cough.  History of Present Illness: 5 days of deep cough producing yellow sputum, fevers, and fatigue. On Robitussin and Sudafed.   Current Allergies: ! NSAIDS DEPAKOTE (DIVALPROEX SODIUM)     Review of Systems      See HPI   Physical Exam  General:     Well-developed,well-nourished,in no acute distress; alert,appropriate and cooperative throughout examination Head:     Normocephalic and atraumatic without obvious abnormalities. No apparent alopecia or balding. Eyes:     No corneal or conjunctival inflammation noted. EOMI. Perrla. Funduscopic exam benign, without hemorrhages, exudates or papilledema. Vision grossly normal. Ears:     External ear exam shows no significant lesions or deformities.  Otoscopic examination reveals clear canals, tympanic membranes are intact bilaterally without bulging, retraction, inflammation or discharge. Hearing is grossly normal bilaterally. Nose:     External nasal examination shows no deformity or inflammation. Nasal mucosa are pink and moist without lesions or exudates. Mouth:     Oral mucosa and oropharynx without lesions or exudates.  Teeth in good repair. Neck:     No deformities, masses, or tenderness noted. Lungs:     Normal respiratory effort, chest expands symmetrically. Lungs are clear to auscultation, no crackles or wheezes.    Impression & Recommendations:  Problem # 1:  ACUTE BRONCHITIS (ICD-466.0)  Her updated medication list for this problem includes:    Hycodan 5-1.5 Mg/52ml Syrp (Hydrocodone-homatropine) .Marland Kitchen... 1  tsp every 4 hours as needed cough    Zithromax Z-pak 250 Mg Tabs (Azithromycin) .Marland Kitchen... As directed   Complete Medication List: 1)  Lunesta 3 Mg Tabs (Eszopiclone) .... One at bedtime 2)  Hydrocodone-acetaminophen 5-500 Mg Tabs (Hydrocodone-acetaminophen) .... Take 1 tablet by mouth every four to six hours 3)  Prempro 0.625-2.5 Mg Tabs (Conj estrog-medroxyprogest ace) .... Take 1 tablet by mouth once a day 4)  Zomig 5 Mg Tabs (Zolmitriptan) .... Take 1 tablet at onset of headaches 5)  Adult Aspirin Low Strength 81 Mg Tbdp (Aspirin) .Marland Kitchen.. 1 by mouth once daily 6)  Diazepam 5 Mg Tabs (Diazepam) .... As needed 7)  Cymbalta 20 Mg Cpep (Duloxetine hcl) .Marland Kitchen.. 1 by mouth once daily 8)  Protonix 40 Mg Tbec (Pantoprazole sodium) .Marland Kitchen.. 1 by mouth once daily 9)  Zyrtec Allergy 10 Mg Tabs (Cetirizine hcl) .Marland Kitchen.. 1 by mouth once daily 10)  Prednisone 20 Mg Tabs (Prednisone) .Marland Kitchen.. 1 by mouth once daily 11)  Crestor 10 Mg Tabs (Rosuvastatin calcium) .Marland Kitchen.. 1 by mouth once daily 12)  Hycodan 5-1.5 Mg/59ml Syrp (Hydrocodone-homatropine) .Marland Kitchen.. 1 tsp every 4 hours as needed cough 13)  Zithromax Z-pak 250 Mg Tabs (Azithromycin) .... As directed   Patient Instructions: 1)  Please schedule a follow-up appointment as needed.    Prescriptions: ZITHROMAX Z-PAK 250 MG  TABS (AZITHROMYCIN) as directed  #1 x 0   Entered and Authorized by:   Nelwyn Salisbury MD  Signed by:   Nelwyn Salisbury MD on 09/09/2007   Method used:   Print then Give to Patient   RxID:   [id number] HYCODAN 5-1.5 MG/5ML  SYRP (HYDROCODONE-HOMATROPINE) 1 tsp every 4 hours as needed cough  #240 ml x 0   Entered and Authorized by:   Nelwyn Salisbury MD   Signed by:   Nelwyn Salisbury MD on 09/09/2007   Method used:   Print then Give to Patient   RxID:   [id number]  ]

## 2010-10-03 NOTE — Letter (Signed)
Summary: Results Follow-up Letter  [place] [facility]  [address] [place], [place] [address]   Phone: [phone]  Fax: [phone]    03/29/2009  [address] [place], [place]  [address]  Dear Emily Ward,   The following are the results of your recent test(s):  Test     Result     Pap Smear    Normal___YES____    _________________________________________________________    Sincerely,  DR. Gwenyth Bender [facility] [place] [facility]

## 2010-10-03 NOTE — Progress Notes (Signed)
Summary: new rx  Phone Note Call from Patient Call back at [phone]   Caller: patient live Call For: stafford Details of Action Taken: pt to pick up rx Summary of Call: Patient saw Dr Tuesday and he forgot to give her a rx for perscet 10-650mg .  [name], or does she have to pickup please call. Initial call taken by: Celine Ahr,  Jan 22, 2008 8:37 AM    to leave rx at front to pick up on Friday

## 2010-10-03 NOTE — Assessment & Plan Note (Signed)
Summary: CPX W/PAP/CCM   Vital Signs:  Patient Profile:   55 Years Old Female Weight:      114 pounds Temp:     98.9 degrees F Pulse rate:   107 / minute BP sitting:   96 / 60  (left arm) Cuff size:   regular  Vitals Entered By: Pura Spice, RN (March 23, 2008 3:05 PM)                 Chief Complaint:  cpx with pap no labs stated had labs drawn prior to bladdder surgery .  History of Present Illness: Pt in for physical exaination Had bladder surgery in March with good results Continues to have aching from fibromyalgia, muscle spasm, neck and shoulder aching, sees  Dr. Lenore Cordia Needs Remus Loffler for sleep No other complaints     Current Allergies (reviewed today): ! NSAIDS DEPAKOTE (DIVALPROEX SODIUM)     Review of Systems      See HPI  General      Complains of fatigue.      Denies chills, fever, loss of appetite, malaise, sleep disorder, sweats, weakness, and weight loss.  ENT      Denies decreased hearing, difficulty swallowing, ear discharge, earache, hoarseness, nasal congestion, nosebleeds, postnasal drainage, ringing in ears, sinus pressure, and sore throat.  CV      Denies bluish discoloration of lips or nails, chest pain or discomfort, difficulty breathing at night, difficulty breathing while lying down, fainting, fatigue, leg cramps with exertion, lightheadness, near fainting, palpitations, shortness of breath with exertion, swelling of feet, swelling of hands, and weight gain.  Resp      Denies chest discomfort, chest pain with inspiration, cough, coughing up blood, excessive snoring, hypersomnolence, morning headaches, pleuritic, shortness of breath, sputum productive, and wheezing.  GI      Denies abdominal pain, bloody stools, change in bowel habits, constipation, dark tarry stools, diarrhea, excessive appetite, gas, hemorrhoids, indigestion, loss of appetite, nausea, vomiting, vomiting blood, and yellowish skin color.  GU      See HPI      Denies  abnormal vaginal bleeding, decreased libido, discharge, dysuria, genital sores, hematuria, incontinence, nocturia, urinary frequency, and urinary hesitancy.  MS      Complains of mid back pain, muscle aches, cramps, and thoracic pain.      fibromyalgia  Derm      Denies changes in color of skin, changes in nail beds, dryness, excessive perspiration, flushing, hair loss, insect bite(s), itching, lesion(s), poor wound healing, and rash.  Neuro      Denies brief paralysis, difficulty with concentration, disturbances in coordination, falling down, headaches, inability to speak, memory loss, numbness, poor balance, seizures, sensation of room spinning, tingling, tremors, visual disturbances, and weakness.  Psych      Denies alternate hallucination ( auditory/visual), anxiety, depression, easily angered, easily tearful, irritability, mental problems, panic attacks, sense of great danger, suicidal thoughts/plans, thoughts of violence, unusual visions or sounds, and thoughts /plans of harming others.  Endo      Denies cold intolerance, excessive hunger, excessive thirst, excessive urination, heat intolerance, polyuria, and weight change.  Heme      Denies abnormal bruising, bleeding, enlarge lymph nodes, fevers, pallor, and skin discoloration.  Allergy      Denies hives or rash, itching eyes, persistent infections, seasonal allergies, and sneezing.  CV      Denies bluish discoloration of lips or nails, chest pain or discomfort, difficulty breathing at night, difficulty breathing while lying  down, fainting, fatigue, leg cramps with exertion, lightheadness, near fainting, palpitations, shortness of breath with exertion, swelling of feet, swelling of hands, and weight gain.   Physical Exam  General:     Well-developed,well-nourished,in no acute distress; alert,appropriate and cooperative throughout examination Head:     Normocephalic and atraumatic without obvious abnormalities. No apparent  alopecia or balding. Eyes:     No corneal or conjunctival inflammation noted. EOMI. Perrla. Funduscopic exam benign, without hemorrhages, exudates or papilledema. Vision grossly normal. Ears:     External ear exam shows no significant lesions or deformities.  Otoscopic examination reveals clear canals, tympanic membranes are intact bilaterally without bulging, retraction, inflammation or discharge. Hearing is grossly normal bilaterally. Nose:     External nasal examination shows no deformity or inflammation. Nasal mucosa are pink and moist without lesions or exudates. Mouth:     Oral mucosa and oropharynx without lesions or exudates.  Teeth in good repair. Neck:     No deformities, masses, or tenderness noted. Chest Wall:     No deformities, masses, or tenderness noted. Breasts:     No mass, nodules, thickening, tenderness, bulging, retraction, inflamation, nipple discharge or skin changes noted.   Lungs:     Normal respiratory effort, chest expands symmetrically. Lungs are clear to auscultation, no crackles or wheezes. Heart:     Normal rate and regular rhythm. S1 and S2 normal without gallop, murmur, click, rub or other extra sounds. Abdomen:     Bowel sounds positive,abdomen soft and non-tender without masses, organomegaly or hernias noted. Rectal:     No external abnormalities noted. Normal sphincter tone. No rectal masses or tenderness. Genitalia:     Normal introitus for age, no external lesions, no vaginal discharge, mucosa pink and moist, no vaginal or cervical lesions, no vaginal atrophy, no friaility or hemorrhage, normal uterus size and position, no adnexal masses or tenderness Msk:     No deformity or scoliosis noted of thoracic or lumbar spine.  muscle tenderness over shoulders Pulses:     R and L carotid,radial,femoral,dorsalis pedis and posterior tibial pulses are full and equal bilaterally Extremities:     No clubbing, cyanosis, edema, or deformity noted with normal full  range of motion of all joints.   Neurologic:     No cranial nerve deficits noted. Station and gait are normal. Plantar reflexes are down-going bilaterally. DTRs are symmetrical throughout. Sensory, motor and coordinative functions appear intact. Skin:     Intact without suspicious lesions or rashes Cervical Nodes:     No lymphadenopathy noted Axillary Nodes:     No palpable lymphadenopathy Inguinal Nodes:     No significant adenopathy Psych:     Cognition and judgment appear intact. Alert and cooperative with normal attention span and concentration. No apparent delusions, illusions, hallucinations    Impression & Recommendations:  Problem # 1:  PHYSICAL EXAMINATION (ICD-V70.0) Assessment: New  Problem # 2:  FIBROMYALGIA (ICD-729.1) Assessment: Unchanged  The following medications were removed from the medication list:    Flexeril 10 Mg Tabs (Cyclobenzaprine hcl) .Marland Kitchen... 1 by mouth three times a day as needed muscle spasms  Her updated medication list for this problem includes:    Adult Aspirin Low Strength 81 Mg Tbdp (Aspirin) .Marland Kitchen... 1 by mouth once daily    Esgic-plus 50-500-40 Mg Tabs (Butalbital-apap-caffeine) .Marland Kitchen... 1 every 4-6 hrs as needed  pain not to exceed 4  day    Endocet 10-650 Mg Tabs (Oxycodone-acetaminophen) .Marland Kitchen... 1 q4-6 hr as needed severe  pain    Hydrocodone-acetaminophen 10-325 Mg Tabs (Hydrocodone-acetaminophen) .Marland Kitchen... 1 every 4-6 hrsp as needed pain.    Parafon Forte Dsc 500 Mg Tabs (Chlorzoxazone) .Marland Kitchen... As needed   Problem # 3:  HEADACHE (ICD-784.0) Assessment: Unchanged  Her updated medication list for this problem includes:    Zomig 5 Mg Tabs (Zolmitriptan) .Marland Kitchen... Take 1 tablet at onset of headaches    Adult Aspirin Low Strength 81 Mg Tbdp (Aspirin) .Marland Kitchen... 1 by mouth once daily    Esgic-plus 50-500-40 Mg Tabs (Butalbital-apap-caffeine) .Marland Kitchen... 1 every 4-6 hrs as needed  pain not to exceed 4  day    Endocet 10-650 Mg Tabs (Oxycodone-acetaminophen) .Marland Kitchen... 1 q4-6  hr as needed severe pain    Hydrocodone-acetaminophen 10-325 Mg Tabs (Hydrocodone-acetaminophen) .Marland Kitchen... 1 every 4-6 hrsp as needed pain.   Complete Medication List: 1)  Prempro 0.625-2.5 Mg Tabs (Conj estrog-medroxyprogest ace) .... Take 1 tablet by mouth once a day 2)  Zomig 5 Mg Tabs (Zolmitriptan) .... Take 1 tablet at onset of headaches 3)  Adult Aspirin Low Strength 81 Mg Tbdp (Aspirin) .Marland Kitchen.. 1 by mouth once daily 4)  Diazepam 5 Mg Tabs (Diazepam) .... As needed 5)  Cymbalta 20 Mg Cpep (Duloxetine hcl) .Marland Kitchen.. 1 by mouth once daily 6)  Protonix 40 Mg Tbec (Pantoprazole sodium) .Marland Kitchen.. 1 by mouth once daily 7)  Zyrtec Allergy 10 Mg Tabs (Cetirizine hcl) .Marland Kitchen.. 1 by mouth once daily 8)  Crestor 10 Mg Tabs (Rosuvastatin calcium) .Marland Kitchen.. 1 by mouth once daily 9)  Esgic-plus 50-500-40 Mg Tabs (Butalbital-apap-caffeine) .Marland Kitchen.. 1 every 4-6 hrs as needed  pain not to exceed 4  day 10)  Endocet 10-650 Mg Tabs (Oxycodone-acetaminophen) .Marland Kitchen.. 1 q4-6 hr as needed severe pain 11)  Hydrocodone-acetaminophen 10-325 Mg Tabs (Hydrocodone-acetaminophen) .Marland Kitchen.. 1 every 4-6 hrsp as needed pain. 12)  Lyrica 25 Mg Caps (Pregabalin) .... Once daily 13)  Ambien 10 Mg Tabs (Zolpidem tartrate) .Marland Kitchen.. 1 by mouth hs as needed sleep 14)  Parafon Forte Dsc 500 Mg Tabs (Chlorzoxazone) .... As needed  Osteoporosis Risk Assessment:  Risk Factors for Fracture or Low Bone Density:   Race (White or Asian):     yes   Smoking status:       current  Immunization & Chemoprophylaxis:    MMR vaccine (#3): Historical  (06/30/2007)    Tetanus vaccine: Historical  (09/03/1998)    Influenza vaccine: Historical  (06/30/2007)   Patient Instructions: 1)  Continue regular medications 2)  Talk to Dr. Corliss Skains about [place]   Prescriptions: AMBIEN 10 MG  TABS (ZOLPIDEM TARTRATE) 1 by mouth hs as needed sleep  #305 x 5   Entered and Authorized by:   [name] MD   Signed by:   [name] MD on 03/23/2008   Method used:    Faxed to ...       [name] (retail)       [address]       [place], [place]  [address]       Ph: [phone] or [phone]       Fax: [phone]   RxID:   [id number] ESGIC-PLUS 50-500-40 MG  TABS (BUTALBITAL-APAP-CAFFEINE) 1 every 4-6 hrs as needed  pain not to exceed 4  day  #120 x 5   Entered and Authorized by:   [name] MD   Signed by:   [name] MD on 03/23/2008  Method used:   Electronically to        [facility] (retail)       [address]       [place], [place]  [address]       Ph: [phone] or [phone]       Fax: [phone]   RxID:   [id number] CRESTOR 10 MG  TABS (ROSUVASTATIN CALCIUM) 1 by mouth once daily  #30 x 1   Entered and Authorized by:   [name] MD   Signed by:   [name] MD on 03/23/2008   Method used:   Electronically to        [facility] (retail)       [address]       [place], [place]  [address]       Ph: [phone] or [phone]       Fax: [phone]   RxID:   [id number] PROTONIX 40 MG  TBEC (PANTOPRAZOLE SODIUM) 1 by mouth once daily  #30 x 11   Entered and Authorized by:   [name] MD   Signed by:   [name] MD on 03/23/2008   Method used:   Electronically to        [facility] (retail)       [address]       [place], [place]  [address]       Ph: [phone] or [phone]       Fax: [phone]   RxID:   [id number] ZOMIG 5 MG  TABS (ZOLMITRIPTAN) take 1 tablet at onset of headaches  #6 x 11   Entered and Authorized by:   [name] MD   Signed by:   [name] MD on 03/23/2008   Method used:   Electronically to        [facility] (retail)       [address]       [place], [place]  [address]       Ph: [phone] or [phone]        Fax: [phone]   RxID:   [id number] PREMPRO 0.625-2.5 MG TABS (CONJ ESTROG-MEDROXYPROGEST ACE) Take 1 tablet by mouth once a day  #1 qd x 28   Entered and Authorized by:   [name] MD   Signed by:   [name] MD on 03/23/2008   Method used:   Electronically to        [facility] (retail)       [address]       [place], [place]  [address]       Ph: [phone] or [phone]       Fax: [phone]   RxID:   [id number]  ]

## 2010-10-03 NOTE — Assessment & Plan Note (Signed)
Summary: CPX W/ PAP // RS   Vital Signs:  Patient profile:   55 year old female Menstrual status:  postmenopausal Height:      65.5 inches Weight:      116 pounds BMI:     19.08 Temp:     98.7 degrees F Pulse rate:   94 / minute Pulse rhythm:   regular BP sitting:   94 / 62  (left arm) Cuff size:   regular  Vitals Entered By: Pura Spice, RN (March 28, 2010 10:09 AM) CC: cpx with pap  c/o cough sinuses taking some amoxicillin    History of Present Illness: This 55 year old white married female is in for complete physical examination including a Pap smear she complains of cough nasal congestion and pressure over the face has a productive yellow thick sputum present over the past week and increasing in severity Her lites she does yoga 2-3 times a week which helps her fibromyalgia considerable Continues to have headache early in most of the time with your set and occasional in the oxycodone Continues to take Lyrica for her fibromyalgia  Allergies: 1)  ! Nsaids 2)  Depakote (Divalproex Sodium)  Past History:  Past Medical History: Last updated: 05/09/2007 Peptic Ulcer MVP Heart Murmur Hepatitis/Jaundice High Cholesterol Migraines Headache  Past Surgical History: Last updated: 05/09/2007 Cholecystectomy Caesarean section Hysterectomy Laparotomy-exploratory  Social History: Last updated: 01/20/2008  Married Current Smoker Alcohol use-yes Drug use-no Regular exercise-yes  Risk Factors: Smoking Status: current (05/09/2007)  Review of Systems      See HPI  The patient denies anorexia, fever, weight loss, weight gain, vision loss, decreased hearing, hoarseness, chest pain, syncope, dyspnea on exertion, peripheral edema, prolonged cough, headaches, hemoptysis, abdominal pain, melena, hematochezia, severe indigestion/heartburn, hematuria, incontinence, genital sores, muscle weakness, suspicious skin lesions, transient blindness, difficulty walking, depression,  unusual weight change, abnormal bleeding, enlarged lymph nodes, angioedema, breast masses, and testicular masses.    Physical Exam  General:  Well-developed,well-nourished,in no acute distress; alert,appropriate and cooperative throughout examinationunderweight appearing.   Head:  Normocephalic and atraumatic without obvious abnormalities. No apparent alopecia or balding. Eyes:  No corneal or conjunctival inflammation noted. EOMI. Perrla. Funduscopic exam benign, without hemorrhages, exudates or papilledema. Vision grossly normal. Ears:  External ear exam shows no significant lesions or deformities.  Otoscopic examination reveals clear canals, tympanic membranes are intact bilaterally without bulging, retraction, inflammation or discharge. Hearing is grossly normal bilaterally. Nose:  nasal mucosa edematous with yellow drainage Mouth:  Oral mucosa and oropharynx without lesions or exudates.  Teeth in good repair. Neck:  No deformities, masses, or tenderness noted. Chest Wall:  No deformities, masses, or tenderness noted. Breasts:  No mass, nodules, thickening, tenderness, bulging, retraction, inflamation, nipple discharge or skin changes noted.   Lungs:  rhonchibilaterally no dullness no wheezing no rales Heart:  Normal rate and regular rhythm. S1 and S2 normal without gallop, murmur, click, rub or other extra sounds. Abdomen:  Bowel sounds positive,abdomen soft and non-tender without masses, organomegaly or hernias noted. Rectal:  No external abnormalities noted. Normal sphincter tone. No rectal masses or tenderness. Genitalia:  Normal introitus for age, no external lesions, no vaginal discharge, mucosa pink and moist, no vaginal or cervical lesions, no vaginal atrophy, no friaility or hemorrhage, normal uterus size and position, no adnexal masses or tenderness Msk:  No deformity or scoliosis noted of thoracic or lumbar spine.   Pulses:  R and L carotid,radial,femoral,dorsalis pedis and posterior  tibial pulses are full and equal  bilaterally Extremities:  No clubbing, cyanosis, edema, or deformity noted with normal full range of motion of all joints.   Neurologic:  No cranial nerve deficits noted. Station and gait are normal. Plantar reflexes are down-going bilaterally. DTRs are symmetrical throughout. Sensory, motor and coordinative functions appear intact. Skin:  Intact without suspicious lesions or rashes Cervical Nodes:  No lymphadenopathy noted Axillary Nodes:  No palpable lymphadenopathy Inguinal Nodes:  No significant adenopathy Psych:  Cognition and judgment appear intact. Alert and cooperative with normal attention span and concentration. No apparent delusions, illusions, hallucinations   Impression & Recommendations:  Problem # 1:  HEALTH MAINTENANCE EXAM (ICD-V70.0) Assessment New  Orders: Venipuncture (96295) T-Vitamin D (25-Hydroxy) (28413-24401) UA Dipstick w/o Micro (automated)  (81003) Specimen Handling (02725) TLB-BMP (Basic Metabolic Panel-BMET) (80048-METABOL) TLB-CBC Platelet - w/Differential (85025-CBCD) TLB-Hepatic/Liver Function Pnl (80076-HEPATIC) TLB-TSH (Thyroid Stimulating Hormone) (84443-TSH)  Problem # 2:  SINUSITIS, ACUTE (ICD-461.9) Assessment: Deteriorated  The following medications were removed from the medication list:    Doxycycline Hyclate 50 Mg Caps (Doxycycline hyclate) ..... One twice daily    Levaquin 500 Mg Tabs (Levofloxacin) .Marland Kitchen... 1 once daily for 10 days Her updated medication list for this problem includes:    Amoxicillin 500 Mg Caps (Amoxicillin) .Marland Kitchen... 1 three times a day for bronchitis  Problem # 3:  ACUTE BRONCHITIS (ICD-466.0) Assessment: Deteriorated  The following medications were removed from the medication list:    Doxycycline Hyclate 50 Mg Caps (Doxycycline hyclate) ..... One twice daily    Levaquin 500 Mg Tabs (Levofloxacin) .Marland Kitchen... 1 once daily for 10 days Her updated medication list for this problem includes:     Amoxicillin 500 Mg Caps (Amoxicillin) .Marland Kitchen... 1 three times a day for bronchitis  Problem # 4:  FIBROMYALGIA (ICD-729.1) Assessment: Improved  The following medications were removed from the medication list:    Butalbital-apap-caffeine 50-500-40 Mg Tabs (Butalbital-apap-caffeine) .Marland Kitchen... Take 2  tab q4h as needed for headache    Endocet 10-650 Mg Tabs (Oxycodone-acetaminophen) .Marland Kitchen... 1 q4-6 hr as needed severe pain Her updated medication list for this problem includes:    Adult Aspirin Low Strength 81 Mg Tbdp (Aspirin) .Marland Kitchen... 1 by mouth once daily    Esgic-plus 50-500-40 Mg Tabs (Butalbital-apap-caffeine) .Marland Kitchen... 1 every 4-6 hrs as needed  pain not to exceed 4  day    Hydrocodone-acetaminophen 10-325 Mg Tabs (Hydrocodone-acetaminophen) .Marland Kitchen... 1 every 4-6 hrsp as needed pain. not to exceed 4 per day.    Parafon Forte Dsc 500 Mg Tabs (Chlorzoxazone) .Marland Kitchen... 1 by mouth four times a day  with meals    Cyclobenzaprine Hcl 10 Mg Tabs (Cyclobenzaprine hcl) .Marland Kitchen... 1 by mouth three times a day for muscle spasms  Problem # 5:  HEADACHE (ICD-784.0) Assessment: Improved  The following medications were removed from the medication list:    Butalbital-apap-caffeine 50-500-40 Mg Tabs (Butalbital-apap-caffeine) .Marland Kitchen... Take 2  tab q4h as needed for headache    Endocet 10-650 Mg Tabs (Oxycodone-acetaminophen) .Marland Kitchen... 1 q4-6 hr as needed severe pain Her updated medication list for this problem includes:    Zomig 5 Mg Tabs (Zolmitriptan) .Marland Kitchen... Take 1 tablet at onset of headaches    Adult Aspirin Low Strength 81 Mg Tbdp (Aspirin) .Marland Kitchen... 1 by mouth once daily    Esgic-plus 50-500-40 Mg Tabs (Butalbital-apap-caffeine) .Marland Kitchen... 1 every 4-6 hrs as needed  pain not to exceed 4  day    Hydrocodone-acetaminophen 10-325 Mg Tabs (Hydrocodone-acetaminophen) .Marland Kitchen... 1 every 4-6 hrsp as needed pain. not to exceed 4 per day.  Complete Medication List: 1)  Prempro 0.625-2.5 Mg Tabs (Conj estrog-medroxyprogest ace) .... Take 1 tablet by mouth  once a day 2)  Zomig 5 Mg Tabs (Zolmitriptan) .... Take 1 tablet at onset of headaches 3)  Adult Aspirin Low Strength 81 Mg Tbdp (Aspirin) .Marland Kitchen.. 1 by mouth once daily 4)  Protonix 40 Mg Tbec (Pantoprazole sodium) .Marland Kitchen.. 1 by mouth once daily 5)  Zyrtec Allergy 10 Mg Tabs (Cetirizine hcl) .Marland Kitchen.. 1 by mouth once daily 6)  Crestor 10 Mg Tabs (Rosuvastatin calcium) .Marland Kitchen.. 1 by mouth once daily 7)  Esgic-plus 50-500-40 Mg Tabs (Butalbital-apap-caffeine) .Marland Kitchen.. 1 every 4-6 hrs as needed  pain not to exceed 4  day 8)  Hydrocodone-acetaminophen 10-325 Mg Tabs (Hydrocodone-acetaminophen) .Marland Kitchen.. 1 every 4-6 hrsp as needed pain. not to exceed 4 per day. 9)  Lyrica 25 Mg Caps (Pregabalin) .Marland Kitchen.. 1 qid 10)  Ambien 10 Mg Tabs (Zolpidem tartrate) .Marland Kitchen.. 1 by mouth hs as needed sleep 11)  Parafon Forte Dsc 500 Mg Tabs (Chlorzoxazone) .Marland Kitchen.. 1 by mouth four times a day  with meals 12)  Cymbalta 30 Mg Cpep (Duloxetine hcl) .Marland Kitchen.. 1 tid 13)  Lovaza 1 Gm Caps (Omega-3-acid ethyl esters) .Marland Kitchen.. 1 once daily two times a day 14)  Cyclobenzaprine Hcl 10 Mg Tabs (Cyclobenzaprine hcl) .Marland Kitchen.. 1 by mouth three times a day for muscle spasms 15)  Amoxicillin 500 Mg Caps (Amoxicillin) .Marland Kitchen.. 1 three times a day for bronchitis  Patient Instructions: 1)  physical examination reveals a healthy lady with fibromyalgia and other problems which are well controlled at this time 2)  Chief problem consistent with sinusitis and bronchitis 3)  Amoxicillin 500 mg 3 times daily 4)  Hydromet for call 5)  Lisa next D. extra strength b.i.d. 6)  Lab studies were good 7)  Refill all necessary medications Prescriptions: AMOXICILLIN 500 MG CAPS (AMOXICILLIN) 1 three times a day for bronchitis  #30 x 0   Entered and Authorized by:   [name] MD   Signed by:   [name] MD on 03/28/2010   Method used:   Electronically to        [facility]* (retail)       [address]       [place], [place]  [address]        Ph: [phone] or [phone]       Fax: [phone]   RxID:   [id number] AMBIEN 10 MG  TABS (ZOLPIDEM TARTRATE) 1 by mouth hs as needed sleep  #30 x 5   Entered and Authorized by:   [name] MD   Signed by:   [name] MD on 03/28/2010   Method used:   Print then Give to Patient   RxID:   [id number] LYRICA 25 MG  CAPS (PREGABALIN) 1 qid  #360 x 0   Entered and Authorized by:   [name] MD   Signed by:   [name] MD on 03/28/2010   Method used:   Printed then faxed to ...       [facility] (mail-order)             , [place]         Ph: [phone]       Fax: [phone]   RxID:   [id number] CYCLOBENZAPRINE HCL 10 MG TABS (CYCLOBENZAPRINE HCL) 1 by mouth three times a day  for muscle spasms  #270 x 3   Entered and Authorized by:   [name] MD   Signed by:   [name] MD on 03/28/2010   Method used:   Faxed to ...       [facility] (mail-order)             , [place]         Ph: [phone]       Fax: [phone]   RxID:   [id number] LOVAZA 1 GM CAPS (OMEGA-3-ACID ETHYL ESTERS) 1 once daily two times a day  #180 x 3   Entered and Authorized by:   [name] MD   Signed by:   [name] MD on 03/28/2010   Method used:   Faxed to ...       [facility] (mail-order)             , [place]         Ph: [phone]       Fax: [phone]   RxID:   [id number] CYMBALTA 30 MG CPEP (DULOXETINE HCL) 1 tid  #270 x 3   Entered and Authorized by:   [name] MD   Signed by:   [name] MD on 03/28/2010   Method used:   Faxed to ...       [facility] (mail-order)             , [place]         Ph: [phone]       Fax: [phone]   RxID:   [id number] CRESTOR 10 MG  TABS (ROSUVASTATIN CALCIUM) 1 by mouth once daily  #90 x 3   Entered and Authorized by:   [name] MD   Signed by:   [name] MD on 03/28/2010   Method used:   Faxed to ...       [facility]  (mail-order)             , [place]         Ph: [phone]       Fax: [phone]   RxID:   [id number] PROTONIX 40 MG  TBEC (PANTOPRAZOLE SODIUM) 1 by mouth once daily  #90 x 3   Entered and Authorized by:   [name] MD   Signed by:   [name] MD on 03/28/2010   Method used:   Faxed to ...       [facility] (mail-order)             , [place]         Ph: [phone]       Fax: [phone]   RxID:   [id number] HYDROCODONE-ACETAMINOPHEN 10-325 MG  TABS (HYDROCODONE-ACETAMINOPHEN) 1 every 4-6 hrsp as needed pain. not to exceed 4 per day.  #100 x 5   Entered and Authorized by:   [name] MD   Signed by:   [name] MD on 03/28/2010   Method used:   Print then Give to Patient   RxID:   [id number]   Laboratory Results   Urine Tests    Routine Urinalysis   Color: yellow Appearance: Clear Glucose: negative   (Normal Range: Negative) Bilirubin: negative   (Normal Range: Negative) Ketone: negative   (Normal Range: Negative) Spec. Gravity: 1.015   (Normal Range: 1.003-1.035) Blood: 3+   (Normal Range:  Negative) pH: 5.0   (Normal Range: 5.0-8.0) Protein: negative   (Normal Range: Negative) Urobilinogen: 0.2   (Normal Range: 0-1) Nitrite: negative   (Normal Range: Negative) Leukocyte Esterace: negative   (Normal Range: Negative)    Comments: Rita Ohara  March 28, 2010 12:00 PM

## 2010-10-03 NOTE — Progress Notes (Signed)
Summary: RF  Phone Note Call from Patient Call back at [phone]   Caller: Patient-LIVE CALL Call For: DR STAFFORD Reason for Call: Refill Medication Summary of Call: PT HAS CPX ON 03/23/08. PLEASE RF HER PREMPRO AT [facility] [address]. SHE STATED THAT SHE IS OUT OF MEDICATION. THANKS. CALL PT PLEASE WHEN DONE. Initial call taken by: Warnell Forester,  March 16, 2008 10:41 AM  Follow-up for Phone Call        ok  --done   Follow-up by: Pura Spice, RN,  March 16, 2008 11:17 AM      Prescriptions: PREMPRO 0.625-2.5 MG TABS (CONJ ESTROG-MEDROXYPROGEST ACE) Take 1 tablet by mouth once a day  #30 x 0   Entered by:   Pura Spice, RN   Authorized by:   [name] MD   Signed by:   Pura Spice, RN on 03/16/2008   Method used:   Electronically sent to ...       [name]*       [address]       [place], [place]  [address]       Ph: [phone] or [phone]       Fax: [phone]   RxID:   [id number]

## 2010-10-05 NOTE — Letter (Signed)
Summary: [facility]   Imported By: Maryln Gottron 09/19/2010 10:02:25  _____________________________________________________________________  External Attachment:    Type:   Image     Comment:   External Document

## 2010-10-05 NOTE — Assessment & Plan Note (Signed)
Summary: R ARM PAIN // RS   Vital Signs:  Patient profile:   55 year old female Menstrual status:  postmenopausal Weight:      120 pounds Pulse rate:   70 / minute BP sitting:   114 / 68  (left arm)  Vitals Entered By: Kyung Rudd, CMA (August 25, 2010 10:57 AM) CC: rt arm pain x 2 wks...worse in the past 2 days   CC:  rt arm pain x 2 wks...worse in the past 2 days.  History of Present Illness: Patient presents to clinic as a workin for evaluation of shoulder pain. Pt describes several day h/o right upper arm pain that appears to actually begin right lateral neck and radiate to deltoid insertion. +pain and tenderness at right trapezium muscle worse x last few days. Notes intermittent right fingertip numbness without hand/arm weakness. Denies injury/trauma or reduced rom involving arm or hand. Has h/o cervical neck surgery in the past. States intolerance to nsaids. Took vicoden which she has for chronic pain with h/o fibromyalgia.   Allergies: 1)  ! Nsaids 2)  Depakote (Divalproex Sodium)  Review of Systems      See HPI  Physical Exam  General:  Well-developed,well-nourished,in no acute distress; alert,appropriate and cooperative throughout examination Head:  Normocephalic and atraumatic without obvious abnormalities. No apparent alopecia or balding. Eyes:  pupils equal, pupils round, corneas and lenses clear, and no injection.   Ears:  no external deformities.   Nose:  no external deformity.   Msk:  +tenderness right deltoid insertion. FROM right shoulder without palpable tenderness. No midline cervical tenderness or bony abn. Distal right hand/finger strength 5/5. Sensation grossly intact.    Impression & Recommendations:  Problem # 1:  ARM PAIN, RIGHT (ICD-729.5) Assessment New Suspect cervical radicular pain though does have deltoid insertion pain as well. Avoid nsaids. ROM encouraged. Attempt medrol dosepak and followup if no improvement or worsening.  Complete  Medication List: 1)  Prempro 0.625-2.5 Mg Tabs (Conj estrog-medroxyprogest ace) .... Take 1 tablet by mouth once a day 2)  Zomig 5 Mg Tabs (Zolmitriptan) .... Take 1 tablet at onset of headaches 3)  Adult Aspirin Low Strength 81 Mg Tbdp (Aspirin) .Marland Kitchen.. 1 by mouth once daily 4)  Protonix 40 Mg Tbec (Pantoprazole sodium) .Marland Kitchen.. 1 by mouth once daily 5)  Zyrtec Allergy 10 Mg Tabs (Cetirizine hcl) .Marland Kitchen.. 1 by mouth once daily 6)  Crestor 10 Mg Tabs (Rosuvastatin calcium) .Marland Kitchen.. 1 by mouth once daily 7)  Esgic-plus 50-500-40 Mg Tabs (Butalbital-apap-caffeine) .Marland Kitchen.. 1 every 4-6 hrs as needed  pain not to exceed 4  day 8)  Hydrocodone-acetaminophen 10-325 Mg Tabs (Hydrocodone-acetaminophen) .Marland Kitchen.. 1 every 4-6 hrsp as needed pain. not to exceed 4 per day. 9)  Lyrica 25 Mg Caps (Pregabalin) .Marland Kitchen.. 1 qid 10)  Ambien 10 Mg Tabs (Zolpidem tartrate) .Marland Kitchen.. 1 by mouth hs as needed sleep 11)  Parafon Forte Dsc 500 Mg Tabs (Chlorzoxazone) .Marland Kitchen.. 1 by mouth four times a day  with meals 12)  Cymbalta 30 Mg Cpep (Duloxetine hcl) .... Once daily 13)  Lovaza 1 Gm Caps (Omega-3-acid ethyl esters) .Marland Kitchen.. 1 once daily two times a day 14)  Cyclobenzaprine Hcl 10 Mg Tabs (Cyclobenzaprine hcl) .Marland Kitchen.. 1 by mouth three times a day for muscle spasms 15)  Align Caps (Probiotic product) .Marland Kitchen.. 1 cap by mouth daily 16)  Medrol (pak) 4 Mg Tabs (Methylprednisolone) .... As directed Prescriptions: MEDROL (PAK) 4 MG TABS (METHYLPREDNISOLONE) as directed  #1 x 0  Entered and Authorized by:   Edwyna Perfect MD   Signed by:   Edwyna Perfect MD on 08/25/2010   Method used:   Electronically to        [facility]* (retail)       [address]       [place], [place]  [address]       Ph: [phone]       Fax: [phone]   RxID:   [id number]    Orders Added: 1)  Est. Patient Level IV [25366]

## 2010-10-05 NOTE — Assessment & Plan Note (Signed)
Summary: ARM AND NECK PAIN//SLM   Vital Signs:  Patient profile:   55 year old female Menstrual status:  postmenopausal Weight:      121 pounds Temp:     98.8 degrees F oral BP sitting:   108 / 70  (right arm) Cuff size:   regular  Vitals Entered By: Duard Brady LPN (September 05, 2010 2:33 PM) CC: c/o (R) neck and shoulder pain   - no fall no injury Is Patient Diabetic? No   CC:  c/o (R) neck and shoulder pain   - no fall no injury.  History of Present Illness: 55 year old patient who has a history of cervical disk disease and prior laminectomy.  She also has a history of fibromyalgia.  Complaints today include neck and right shoulder pain.  She states that movement of the arm.  She often gets some paresthesias down to the right hand area.  Denies any motor weakness.  She was seen last month for similar symptoms and after two days of a prednisone dose pack, improved.  Now she is back to baseline.  She is scheduled for rheumatology follow-up in two days.  She states as she is intolerant of anti-inflammatory drugs.  She was treated with a low dose Medrol dosepak last month  Preventive Screening-Counseling & Management  Alcohol-Tobacco     Smoking Status: current  Allergies: 1)  ! Nsaids 2)  Depakote (Divalproex Sodium)  Past History:  Past Medical History: Peptic Ulcer MVP Heart Murmur Hepatitis/Jaundice High Cholesterol Migraines Headache Fibromyalgia Cervical disc disease   Review of Systems  The patient denies anorexia, fever, weight loss, weight gain, vision loss, decreased hearing, hoarseness, chest pain, syncope, dyspnea on exertion, peripheral edema, prolonged cough, headaches, hemoptysis, abdominal pain, melena, hematochezia, severe indigestion/heartburn, hematuria, incontinence, genital sores, muscle weakness, suspicious skin lesions, transient blindness, difficulty walking, depression, unusual weight change, abnormal bleeding, enlarged lymph nodes,  angioedema, and breast masses.    Physical Exam  General:  Well-developed,well-nourished,in no acute distress; alert,appropriate and cooperative throughout examination Head:  Normocephalic and atraumatic without obvious abnormalities. No apparent alopecia or balding. Neck:  full range of motion of the neck, but appeared to be slightly uncomfortable Msk:  full range of motion of the right shoulder.  There was slight tenderness over the posterior shoulder region and the upper back Neurologic:  normal grip strength biceps and triceps reflexes brisk and equal   Impression & Recommendations:  Problem # 1:  FIBROMYALGIA (ICD-729.1)  Her updated medication list for this problem includes:    Adult Aspirin Low Strength 81 Mg Tbdp (Aspirin) .Marland Kitchen... 1 by mouth once daily    Esgic-plus 50-500-40 Mg Tabs (Butalbital-apap-caffeine) .Marland Kitchen... 1 every 4-6 hrs as needed  pain not to exceed 4  day    Hydrocodone-acetaminophen 10-325 Mg Tabs (Hydrocodone-acetaminophen) .Marland Kitchen... 1 every 4-6 hrsp as needed pain. not to exceed 4 per day.    Parafon Forte Dsc 500 Mg Tabs (Chlorzoxazone) .Marland Kitchen... 1 by mouth four times a day  with meals    Cyclobenzaprine Hcl 10 Mg Tabs (Cyclobenzaprine hcl) .Marland Kitchen... 1 by mouth three times a day for muscle spasms  Her updated medication list for this problem includes:    Adult Aspirin Low Strength 81 Mg Tbdp (Aspirin) .Marland Kitchen... 1 by mouth once daily    Esgic-plus 50-500-40 Mg Tabs (Butalbital-apap-caffeine) .Marland Kitchen... 1 every 4-6 hrs as needed  pain not to exceed 4  day    Hydrocodone-acetaminophen 10-325 Mg Tabs (Hydrocodone-acetaminophen) .Marland Kitchen... 1 every 4-6  hrsp as needed pain. not to exceed 4 per day.    Parafon Forte Dsc 500 Mg Tabs (Chlorzoxazone) .Marland Kitchen... 1 by mouth four times a day  with meals    Cyclobenzaprine Hcl 10 Mg Tabs (Cyclobenzaprine hcl) .Marland Kitchen... 1 by mouth three times a day for muscle spasms  Problem # 2:  SHOULDER PAIN, RIGHT (ICD-719.41)  Her updated medication list for this problem  includes:    Adult Aspirin Low Strength 81 Mg Tbdp (Aspirin) .Marland Kitchen... 1 by mouth once daily    Esgic-plus 50-500-40 Mg Tabs (Butalbital-apap-caffeine) .Marland Kitchen... 1 every 4-6 hrs as needed  pain not to exceed 4  day    Hydrocodone-acetaminophen 10-325 Mg Tabs (Hydrocodone-acetaminophen) .Marland Kitchen... 1 every 4-6 hrsp as needed pain. not to exceed 4 per day.    Parafon Forte Dsc 500 Mg Tabs (Chlorzoxazone) .Marland Kitchen... 1 by mouth four times a day  with meals    Cyclobenzaprine Hcl 10 Mg Tabs (Cyclobenzaprine hcl) .Marland Kitchen... 1 by mouth three times a day for muscle spasms  Her updated medication list for this problem includes:    Adult Aspirin Low Strength 81 Mg Tbdp (Aspirin) .Marland Kitchen... 1 by mouth once daily    Esgic-plus 50-500-40 Mg Tabs (Butalbital-apap-caffeine) .Marland Kitchen... 1 every 4-6 hrs as needed  pain not to exceed 4  day    Hydrocodone-acetaminophen 10-325 Mg Tabs (Hydrocodone-acetaminophen) .Marland Kitchen... 1 every 4-6 hrsp as needed pain. not to exceed 4 per day.    Parafon Forte Dsc 500 Mg Tabs (Chlorzoxazone) .Marland Kitchen... 1 by mouth four times a day  with meals    Cyclobenzaprine Hcl 10 Mg Tabs (Cyclobenzaprine hcl) .Marland Kitchen... 1 by mouth three times a day for muscle spasms  Complete Medication List: 1)  Prempro 0.625-2.5 Mg Tabs (Conj estrog-medroxyprogest ace) .... Take 1 tablet by mouth once a day 2)  Zomig 5 Mg Tabs (Zolmitriptan) .... Take 1 tablet at onset of headaches 3)  Adult Aspirin Low Strength 81 Mg Tbdp (Aspirin) .Marland Kitchen.. 1 by mouth once daily 4)  Protonix 40 Mg Tbec (Pantoprazole sodium) .Marland Kitchen.. 1 by mouth once daily 5)  Zyrtec Allergy 10 Mg Tabs (Cetirizine hcl) .Marland Kitchen.. 1 by mouth once daily 6)  Crestor 10 Mg Tabs (Rosuvastatin calcium) .Marland Kitchen.. 1 by mouth once daily 7)  Esgic-plus 50-500-40 Mg Tabs (Butalbital-apap-caffeine) .Marland Kitchen.. 1 every 4-6 hrs as needed  pain not to exceed 4  day 8)  Hydrocodone-acetaminophen 10-325 Mg Tabs (Hydrocodone-acetaminophen) .Marland Kitchen.. 1 every 4-6 hrsp as needed pain. not to exceed 4 per day. 9)  Lyrica 25 Mg Caps  (Pregabalin) .Marland Kitchen.. 1 qid 10)  Ambien 10 Mg Tabs (Zolpidem tartrate) .Marland Kitchen.. 1 by mouth hs as needed sleep 11)  Parafon Forte Dsc 500 Mg Tabs (Chlorzoxazone) .Marland Kitchen.. 1 by mouth four times a day  with meals 12)  Cymbalta 30 Mg Cpep (Duloxetine hcl) .... Once daily 13)  Lovaza 1 Gm Caps (Omega-3-acid ethyl esters) .Marland Kitchen.. 1 once daily two times a day 14)  Cyclobenzaprine Hcl 10 Mg Tabs (Cyclobenzaprine hcl) .Marland Kitchen.. 1 by mouth three times a day for muscle spasms 15)  Align Caps (Probiotic product) .Marland Kitchen.. 1 cap by mouth daily 16)  Methylprednisolone (pak) 4 Mg Tabs (Methylprednisolone) .... As directed  Patient Instructions: 1)  f/u Rheumatology in 2 days  Prescriptions: METHYLPREDNISOLONE (PAK) 4 MG TABS (METHYLPREDNISOLONE) as directed  #one x 0   Entered and Authorized by:   Gordy Savers  MD   Signed by:   Gordy Savers  MD on 09/05/2010   Method used:  Print then Give to Patient   RxID:   [id number]    Orders Added: 1)  Est. Patient Level III [14782]

## 2010-10-05 NOTE — Progress Notes (Signed)
Summary: med refill  Phone Note Refill Request Message from:  Patient on [phone]  Refills Requested: Medication #1:  PREMPRO 0.625-2.5 MG TABS Take 1 tablet by mouth once a day Pt needs refills  Initial call taken by: Heron Sabins,  August 22, 2010 3:43 PM    Prescriptions: PREMPRO 0.625-2.5 MG TABS (CONJ ESTROG-MEDROXYPROGEST ACE) Take 1 tablet by mouth once a day  #28 Tablet x 6   Entered by:   Kern Reap CMA (AAMA)   Authorized by:   [name] MD   Signed by:   Kern Reap CMA (AAMA) on 08/24/2010   Method used:   Electronically to        [id number] (retail)       [address]       [place], [place]  [address]       Ph: [phone]       Fax: [phone]   RxID:   [id number]

## 2010-10-13 ENCOUNTER — Other Ambulatory Visit: Payer: Self-pay | Admitting: Family Medicine

## 2010-10-13 DIAGNOSIS — Z09 Encounter for follow-up examination after completed treatment for conditions other than malignant neoplasm: Secondary | ICD-10-CM

## 2010-10-27 ENCOUNTER — Ambulatory Visit
Admission: RE | Admit: 2010-10-27 | Discharge: 2010-10-27 | Disposition: A | Discharge status: Home / Self Care | Payer: 59 | Source: Ambulatory Visit | Attending: Family Medicine | Admitting: Family Medicine

## 2010-10-27 DIAGNOSIS — Z09 Encounter for follow-up examination after completed treatment for conditions other than malignant neoplasm: Secondary | ICD-10-CM

## 2010-11-21 ENCOUNTER — Other Ambulatory Visit: Payer: Self-pay | Admitting: Family Medicine

## 2010-11-21 NOTE — Telephone Encounter (Signed)
rx faxed to [facility] with 5 refills

## 2010-11-30 ENCOUNTER — Other Ambulatory Visit: Payer: Self-pay

## 2010-11-30 MED ORDER — CHLORZOXAZONE 500 MG PO TABS: 500.0000 mg | ORAL_TABLET | Freq: Four times a day (QID) | ORAL | Status: AC | PRN

## 2010-12-01 ENCOUNTER — Encounter: Payer: Self-pay | Admitting: Family Medicine

## 2010-12-06 ENCOUNTER — Ambulatory Visit (INDEPENDENT_AMBULATORY_CARE_PROVIDER_SITE_OTHER): Payer: 59 | Admitting: Family Medicine

## 2010-12-06 ENCOUNTER — Encounter: Payer: Self-pay | Admitting: Family Medicine

## 2010-12-06 VITALS — BP 120/74 | HR 86 | Temp 99.0°F

## 2010-12-06 DIAGNOSIS — J329 Chronic sinusitis, unspecified: Secondary | ICD-10-CM

## 2010-12-06 MED ORDER — METHYLPREDNISOLONE ACETATE 80 MG/ML IJ SUSP
120.0000 mg | Freq: Once | INTRAMUSCULAR | Status: AC
  Administered 2010-12-06: 120 mg via INTRAMUSCULAR

## 2010-12-06 MED ORDER — AZITHROMYCIN 250 MG PO TABS: ORAL_TABLET | ORAL | Status: AC

## 2010-12-06 NOTE — Progress Notes (Signed)
  Subjective:    Patient ID: Emily Ward, female    DOB: June 04, 1956, 55 y.o.   MRN: [id number]  HPI Here for one week of sinus pressure, HA, ST, PND, and a dry cough. No fever.    Review of Systems  Constitutional: Negative.   HENT: Positive for congestion, postnasal drip and sinus pressure.   Eyes: Negative.   Respiratory: Positive for cough.        Objective:   Physical Exam  Constitutional: She appears well-developed and well-nourished.  HENT:  Head: Normocephalic and atraumatic.  Right Ear: External ear normal.  Left Ear: External ear normal.  Nose: Nose normal.  Mouth/Throat: Oropharynx is clear and moist. No oropharyngeal exudate.  Eyes: Conjunctivae are normal. Pupils are equal, round, and reactive to light.  Pulmonary/Chest: Effort normal and breath sounds normal.  Lymphadenopathy:    She has no cervical adenopathy.          Assessment & Plan:  Rest, fluids

## 2010-12-19 LAB — POCT HEMOGLOBIN-HEMACUE: Hemoglobin: 15 g/dL (ref 12.0–15.0)

## 2011-01-16 NOTE — Op Note (Signed)
NAME:  COSIMA, PRENTISS NO.:  [id number]   MEDICAL RECORD NO.:  [id number]          PATIENT TYPE:  AMB   LOCATION:  [facility]                         FACILITY:  [facility]   PHYSICIAN:  Valetta Fuller, M.D.  DATE OF BIRTH:  07/21/1956   DATE OF PROCEDURE:  12/15/2007  DATE OF DISCHARGE:                               OPERATIVE REPORT   PREOPERATIVE DIAGNOSIS:  Pelvic pain and microhematuria.   POSTOPERATIVE DIAGNOSIS:  Pelvic pain and microhematuria.   PROCEDURES:  1. Cystourethroscopy with hydrodistention.  2. Installation of Clorpactin and Marcaine.   RESIDENT PHYSICIAN:  Dr. Maudie Flakes.   ANESTHESIA:  General.   INDICATION FOR PROCEDURE:  Ms. Yellin is a 55 year old white female with  a past medical history positive for chronic pelvic pain and interstitial  cystitis.  She also has a history of microhematuria.  She has elected to  undergo hydrodistention with possible installation of anesthetic.  Informed consent was obtained preoperatively after discussing risks,  consequences and benefits with Dr. Isabel Caprice at length.   PROCEDURE IN DETAIL:  The patient was brought to the operating room and  placed in the supine position.  She was correctly identified by her  wristband and appropriate timeout was taken.  Once adequate anesthesia  was delivered, she had IV antibiotics administered.  She was placed in  the dorsolithotomy position with great care taken to minimize the risk  of peripheral neuropathy or compartment syndrome.  Her perineum was  prepped and draped sterilely.  We began the procedure by performing  rigid cystourethroscopy with a 22-French rigid cystoscopic sheath with  12-degree lens in sterile water as an irrigant.  The urethra was normal  and had short course and caliber.  Upon entry into the bladder clear  urine was identified.  Both ureteral orifices were noted to be in their  normal anatomic position effluxing clear urine.  The mucosa was normal  without evidence suggestive of source of her microhematuria.  No foreign  bodies or stone material were seen.  We then performed hydrodistention  with normal saline with sterile water at a height of 80 cmH2O.  We held  her in the distended phase per 5 minutes.  Her bladder was then slowly  drained and close inspection revealed grade 3 to 4 glomerulations with  bleeding.  No Hunter ulcers were identified.  Pictures were taken.  Her  bladder was then completely drained.  Cystometric capacity was  approximately 750 mL.  The bladder was the refilled and cystoscopy  demonstrated the above findings.  We then removed the cystoscope and  placed a 16-French red rubber catheter transurethrally into her bladder.  We then instilled 1000 mL of Clorpactin solution; this was done in two  500-mL aliquots with drainage in between.  After the second aliquot was  instilled, her bladder was drained.  We then recystoscoped her and  irrigated the rest of the Clorpactin out from her bladder.  Her bladder  was then drained again and then 30 mL of 0.5% plain Marcaine was  instilled through the cystoscope.  The cystoscope was removed.  She had  viscous lidocaine jelly instilled in her urethra and a B&O suppository  placed per rectum and this marked the end of our procedure.  She  tolerated the procedure well.  There were no complications.  Dr. Despina Pole  was present and participated in all aspects of the case.      Melina Schools, MD      Valetta Fuller, M.D.  Electronically Signed    JR/MEDQ  D:  12/15/2007  T:  12/15/2007  Job:  [id number]

## 2011-01-16 NOTE — Op Note (Signed)
NAME:  Emily Ward, Emily Ward NO.:  [id number]   MEDICAL RECORD NO.:  [id number]          PATIENT TYPE:  AMB   LOCATION:  [facility]                         FACILITY:  [facility]   PHYSICIAN:  Valetta Fuller, M.D.  DATE OF BIRTH:  [birth year]   DATE OF PROCEDURE:  10/04/2008  DATE OF DISCHARGE:                               OPERATIVE REPORT   PREOPERATIVE DIAGNOSIS:  Interstitial cystitis.   POSTOPERATIVE DIAGNOSIS:  Interstitial cystitis.   PROCEDURE PERFORMED:  Cystoscopy with hydraulic overdistention of the  bladder for interstitial cystitis, fulguration of mucosal bleeding,  instillation of Marcaine and Foley catheter insertion.   SURGEON:  Valetta Fuller, M.D.   ANESTHESIA:  General.   INDICATIONS:  Emily Ward is currently 55 years of age.  She has had  longstanding chronic pelvic pain and interstitial cystitis.  In April  2090 the patient underwent cystoscopic assessment.  She had normal  capacity but had diffuse evidence of glomerular bleeding, consistent  with interstitial cystitis.  The patient obtained improvement with that  procedure.  She subsequently developed recurrent pelvic discomfort.  She  has been empirically treated for cystitis but has continued to have  severe pelvic discomfort with increased bladder overactivity.  She was  seen by Dr. Patsi Sears in my absence and a decision was made to go ahead  with a repeat cystoscopy and hydraulic overdistention of the bladder.  The patient appeared to understand the risks and benefits of this  procedure and has had this performed in the past.  Full informed consent  was obtained.  The patient did receive perioperative antibiotics.   TECHNIQUE AND FINDINGS:  The patient was brought to the operating room  where she had successful induction of general anesthesia.  She was  placed in mid-lithotomy position and prepped draped in the usual manner.  Initial cystoscopy revealed unremarkable urethra.  The bladder itself  showed a couple of tiny areas of mild increased erythema but no other  pathology appreciated.  No evidence of bladder tumor.  The patient's  bladder was then distended to approximately 80 cm of water pressure.  After approximately 700-800 mL of instillation we noted some mucosal  separation on the lateral aspect of her bladder.  With that mucosal  tearing there was some generalized increased bleeding from the mucosal  edges.  The bladder was partially decompressed and a Bugbee electrode  was used to fulgurated the edges.  Because of the mucosal tearing and  the concern that she might have some extravasation of fluid outside her  bladder, the hydraulic overdistention of the bladder was not continued.  At that point she had had a couple of minutes of bladder distention.   We drained approximately 700-800 mL out of her bladder, suggesting that  very little, if any, fluid had potentially extravasated and her  suprapubic area was very soft and nondistended after her bladder was  decompressed.   Endoscopically she had marked diffuse glomerular hemorrhaging diffusely  with a grossly bloody effluent of urine.  We elected to place a Foley  catheter and spent about 15 minutes just gently irrigating her  bladder a  few hundred mL at a time until it was very light pink.  I elected to  completely drain her bladder and then placed 30 mL of Marcaine for about  30 minutes to allow for improved pain control.  We felt that the patient  should continue with a Foley  catheter drainage for at least several days, given the mucosal tearing  noted at the time of hydraulic overdistention of the bladder to prevent  her bladder from becoming over-distended and potentially causing some  urinary extravasation as an outpatient.  She was brought to the recovery  room in stable condition.      Valetta Fuller, M.D.  Electronically Signed     DSG/MEDQ  D:  10/05/2008  T:  10/06/2008  Job:  [id number]

## 2011-01-19 NOTE — Discharge Summary (Signed)
NAME:  GIZELLA, BELLEVILLE                        ACCOUNT NO.:  [id number]   MEDICAL RECORD NO.:  [id number]                   PATIENT TYPE:  INP   LOCATION:  0454                                 FACILITY:  [facility]   PHYSICIAN:  Lazaro Arms, MD          DATE OF BIRTH:  [birth year]   DATE OF ADMISSION:  04/21/2002  DATE OF DISCHARGE:  04/25/2002                                 DISCHARGE SUMMARY   PRIMARY CARE PHYSICIAN:  [facility], [address] [place],  [address]   HOSPITAL COURSE:  The patient is a 55 year old female who was admitted on  August 19 with fever, epigastric pain radiating to her back and elevated  liver function.  In specific, she had an increased total and direct  bilirubin, and transaminases, and alkaline phosphatase.  She had had these  symptoms for approximately one week.  Initially she had had some fever to  103 and some subjective chills, saw her primary who thought she had some  urine infection and put her on some antibiotics.  The fever improved but she  continued to have pain in her upper abdomen, perhaps related to ingestion of  food.  She did notice her urine was getting darker and her stools became  lighter, and presented to the emergency room secondary to increased pain.   In the emergency room she was started on Unasyn and IV fluids.  She received  an ultrasound of the gallbladder which showed a contracted gallbladder and a  HIDA scan which showed no secretion into the bile duct or the gallbladder.  Her initial labs were significant for total bilirubin of 2.7 and indirect of  1.3, alkaline phosphatase of 407, and SGOT of 87, SGPT of 156, and albumin  of 3.2.  Her TSH was normal.  Her Depakote level was less than 10.  Her BMP  was essentially within normal limits.  Her white count was 5.6 with  hematocrit of 39.4 and platelets of 146.   On the day following admission, a Gastroenterology and Surgical consultation  were obtained.  Dr.  Jamey Ripa from Surgery saw her and felt that with her exam  and findings, that it did not present an immediate surgical problem and Dr.  Madilyn Fireman from Gastroenterology was consulted, who felt that this could be due  to an acute hepatic injury or viral problem versus a common bile duct stone.  The patient was observed overnight and the labs were repeated the next day.  In addition, a Monospot was sent.  On the second day, the patient had  persistence of pain in her abdomen radiating to the back and although she  was afebrile, her bilirubin increased to 2.8 and the decision to take the  patient to ERCP was made.  The patient had an ERCP by Dr. Madilyn Fireman on August  21, which was significant for a normal including her gallbladder.  She did  have a sphincterotomy  performed as there was some distal filling defects  which turned out to be air bubbles.  There were no stones seen on multiple  sweeps.  At that point, the antibiotics were discontinued and the patient  remained on IV fluids although her diet was advanced.  She had had some pain  with muscle cramping in her legs which resolved.   On the day of discharge, the patient had only some vague abdominal pain.  Her only complaint was pruritus.  Her leg cramps had resolved and her  paresthesias.  She had had some paresthesias and leg cramps, and on August  22 those had completely resolved.   PHYSICAL EXAM ON DISCHARGE:  VITAL SIGNS:  She was afebrile, pulse was 60-  70, and blood pressure was 105 to 107 systolic.  GENERAL:  She was alert and oriented x3.  HEENT:  There was no icterus.  Her pupils were equal, round and reactive to  light and accommodation.  NECK:  There was no JVD.  HEART:  Within normal limits.  LUNGS:  Clear.  ABDOMEN:  Soft, nontender, nondistended, she had some mild epigastric  tenderness to palpation.  There was no rebound and there was no peritoneal  signs.  EXTREMITIES:  There was no rash and no edema.   DISCHARGE LABS:   Included total Bili of 1.4, direct 0.5, alkaline  phosphatase of 473, SGOT of 78, SGPT of 119, albumin of 3, and calcium of  9.2.  Other labs that had been obtained in order to investigate included a  sed rate which was 31, Ferritin which was 263, essentially all within normal  limits.  Due to the persistent elevation in the alkaline phosphatase and the  unexplained nature of her symptoms, we also sent an anti-smooth muscle  antibody, an antimitochondrial antibody, and an ANA; the results of which  are pending as on the differential includes primary sclerosing cholangitis  and primary biliary stenosis.  Results of those labs, as I mentioned, are  pending and will be followed up by Dr. Madilyn Fireman as an outpatient.   DISCHARGE DIAGNOSES:  1. Abdominal pain and liver function elevation, cause unknown.  Multiple     possibilities including Depakote and Tylenol toxicity and an unknown     viral etiology and/or early chronic liver disease.  Symptoms are almost     completely resolved and the patient is taking p.o. and able to go home.  2. History of chronic migraines, stable.   DISCHARGE MEDICATIONS:  1. Effexor 75 mg p.o. q.d. which she had been on prior to admission.  2. The patient was instructed not to take Depakote and to stay away from     Tylenol, but that she was allowed to use Advil on a p.r.n. basis.   FOLLOW UP:  The patient will follow up with her primary next week.  She is  to get a total and direct bilirubin, alkaline phosphatase, and liver  function tests this week, and she is to call to make an appointment to  follow up with Dr. Madilyn Fireman in one to two weeks.   DISCHARGE INSTRUCTIONS:  She is to return earlier to the emergency room if  she has worsening abdominal pain or inability to keep down p.o.  Lazaro Arms, MD    AC/MEDQ  D:  04/25/2002  T:  04/25/2002  Job:  [id number]  cc:   [facility]   Barrie Folk,  M.D.

## 2011-01-19 NOTE — Op Note (Signed)
NAME:  LIAM, BOSSMAN NO.:  [id number]   MEDICAL RECORD NO.:  [id number]          PATIENT TYPE:  AMB   LOCATION:  ENDO                         FACILITY:  [facility]   PHYSICIAN:  Georgiana Spinner, M.D.    DATE OF BIRTH:  June 11, 1956   DATE OF PROCEDURE:  07/14/2004  DATE OF DISCHARGE:                                 OPERATIVE REPORT   PROCEDURE:  Upper endoscopy with biopsy.   INDICATIONS:  Abdominal pain.   ANESTHESIA:  Demerol 50, Versed 8 mg.   DESCRIPTION OF PROCEDURE:  With patient mildly sedated in the left lateral  decubitus position, the Olympus videoscopic endoscope was inserted in the  mouth, passed under direct vision through the esophagus, which appeared  normal.  There was no evidence of Barrett's.  We entered into the stomach.  Fundus, body, antrum, duodenal bulb, second portion of duodenum were  visualized.  From this point, the endoscope was slowly withdrawn, taking  circumferential views of the duodenal mucosa until the endoscope had been  pulled back into the stomach, placed in retroflexion to view the stomach  from below.  The endoscope was straightened, withdrawn, taking  circumferential views of the remaining gastric and esophageal mucosa,  stopping in the fundus of the stomach where some mild erythema was felt to  be appreciated, and this area was biopsied.  The patient's vital signs and  pulse oximeter remained stable.  The patient tolerated the procedure well  without apparent complications.   FINDINGS:  Mild erythema of gastric fundus, biopsied.  Await biopsy report.  The patient will call me for results and follow up with me as an outpatient.      GMO/MEDQ  D:  07/14/2004  T:  07/14/2004  Job:  [id number]

## 2011-01-19 NOTE — Op Note (Signed)
NAME:  Emily Ward, Emily Ward NO.:  [id number]   MEDICAL RECORD NO.:  [id number]                   PATIENT TYPE:  AMB   LOCATION:  ENDO                                 FACILITY:  [facility]   PHYSICIAN:  Georgiana Spinner, M.D.                 DATE OF BIRTH:  1956/05/26   DATE OF PROCEDURE:  08/13/2003  DATE OF DISCHARGE:                                 OPERATIVE REPORT   PROCEDURE:  Upper endoscopy.   INDICATIONS:  Ulcer.   ANESTHESIA:  1. Demerol 60 mg.  2. Versed 7 mg.   DESCRIPTION OF PROCEDURE:  With patient mildly sedated in the left lateral  decubitus position, the Olympus videoscopic endoscope inserted in the mouth,  passed under direct vision through the esophagus, which appeared normal,  into the stomach through a small hiatal hernia.  Fundus, body, antrum,  duodenal bulb, and second portion of duodenum all appeared normal.  From  this point, the endoscope was slowly withdrawn, taking circumferential views  of the duodenal mucosa until the endoscope pulled back into the stomach,  placed in retroflexion to view the stomach from below.  The endoscope was  then straightened and withdrawn, taking circumferential views of the  remaining gastric and esophageal mucosa.  The patient's vital signs and  pulse oximeter remained stable.  The patient tolerated the procedure well  without apparent complications.   FINDINGS:  Small hiatal hernia.  Unremarkable unremarkable examination.   PLAN:  Have patient follow up with me as needed.                                               Georgiana Spinner, M.D.    GMO/MEDQ  D:  08/13/2003  T:  08/13/2003  Job:  [id number]

## 2011-01-19 NOTE — Op Note (Signed)
NAME:  Emily Ward, Emily Ward                        ACCOUNT NO.:  [id number]   MEDICAL RECORD NO.:  [id number]                   Ward TYPE:  OBV   LOCATION:  0467                                 FACILITY:  [facility]   PHYSICIAN:  Currie Paris, M.D.           DATE OF BIRTH:  Oct 16, 1955   DATE OF PROCEDURE:  06/04/2002  DATE OF DISCHARGE:                                 OPERATIVE REPORT   CCS#:  [id number]   PREOPERATIVE DIAGNOSES:  Biliary colic and biliary diskinesia.   POSTOPERATIVE DIAGNOSES:  Biliary colic and biliary diskinesia.   OPERATION:  Laparoscopic cholecystectomy with Emily operative cholangiogram.   SURGEON:  Currie Paris, M.D.   ASSISTANT:  Sheppard Plumber. Earlene Plater, M.D.   ANESTHESIA:  General endotracheal.   CLINICAL HISTORY:  Emily Ward is a 55 year old with intermittent abdominal  pain which was biliary sounding. Emily Ward had recently been in Emily hospital and  undergone workup when Emily Ward had some elevation of her liver functions as well  as what appeared to be some obstruction of her common duct on a  hepatobiliary scan. There were a couple of small filling defects noted and  it was thought that there were air bubbles but following that Emily Ward continued  to have episodic pain although no elevations in liver functions. Emily Ward  continued to have pain and had normal repeat of hepatobiliary scan which  showed normal excretion with 66% ejection fraction. Nevertheless, Emily Ward  developed duplication of her symptoms when Emily Ward was given Emily simulation for  Emily CCK and it is felt that Emily Ward was having some biliary diskinesia. After a  lengthy discussion with Emily Ward about surgery and Emily fact that Emily Ward  might well not have relief of her symptoms and may have ongoing problems,  Emily Ward wished to proceed to surgery.   DESCRIPTION OF PROCEDURE:  Emily Ward was seen in Emily holding area. Emily Ward had  no further questions and risks, complications, and indications as well as  Emily potential of not  developing a clinical benefit were discussed. Emily Ward had  no further questions and wished to proceed to surgery.   Emily Ward was taken into Emily operating room and after satisfactory  general endotracheal anesthesia had been obtained, Emily abdomen was prepped  and draped. Emily 0.25% plain Marcaine was used for each incision. Emily  umbilical incision was made first, Emily fascia opened and Emily peritoneal  cavity entered under direct vision. A pursestring was placed and Emily Hasson  introduced. Emily abdomen was insufflated to 15.   Emily Ward was placed in reverse Trendelenburg and tilted to Emily left. A  10/11 trocar was placed in Emily epigastrium, two 5 mm laterally. Dissection  of Emily abdominal cavity showed no gross abnormalities. Emily small bowel, what  was visualized, and colon and stomach all looked normal. Emily liver looked  completely normal. There are a few omental adhesions to Emily gallbladder near  Emily lower half  of Emily gallbladder.   Emily gallbladder was retracted over Emily liver and these peritoneal adhesions  taken down and Emily peritoneum over Emily cystic duct area opened. I was able  to identify Emily cystic duct and Emily Ward had what appeared to be a real long  cystic duct and Emily was dissected out. Directly posterior I could see Emily  cystic artery and in fact had that dissected out so I could see Emily anterior  and posterior branches of that and we made sure there were no other  structures in Emily triangle of Calot.   A clip was placed on Emily cystic artery and another one on Emily cystic duct  near its junction with Emily gallbladder. Emily cystic duct was opened and  operative cholangiography performed using Emily [facility] catheter  introduced  percutaneously. On initial attempt, Emily catheter had not gotten into Emily  cystic duct and we had a little contrast still so Emily was repeated and on  Emily second attempt had good filling of Emily common duct, good filling of Emily  duodenum and hepatic radicles and no  other evidence of any abnormalities.   Emily catheter was removed and three clips placed on Emily stay side of Emily  cystic duct and it was divided. Emily clips were placed on Emily distal side of  Emily cystic artery such that we had two on Emily stay side and one each on Emily  anterior and posterior branches and it was divided. Emily gallbladder was  removed from below to above with coagulation and current of Emily cautery.  Just prior to disconnecting and irrigating, I made sure everything was dry.  There had been a little leakage of bile so Emily gallbladder was placed in a  bag and brought out Emily umbilical port.   Emily abdomen was reinsufflated and we irrigated and got rid of any of Emily  bile spill and made sure everything appeared to be dry. When everything  appeared okay, Emily lateral ports were removed and checked and there was no  bleeding. Emily umbilical port was removed and a pursestring tied down to  close that site. Emily abdomen was deflated through Emily epigastric port. Emily  skin was closed with 4-0 Monocryl subcuticular plus Steri-Strips. Emily  Ward tolerated Emily procedure well. There were no operative complications  and all counts were correct.                                               Currie Paris, M.D.    CJS/MEDQ  D:  06/04/2002  T:  06/04/2002  Job:  [id number]   cc:   Ellin Saba., M.D.   John C. Madilyn Fireman, M.D.  [address]  [place]  [place] [address]  Fax: [phone]

## 2011-01-19 NOTE — Consult Note (Signed)
NAME:  Emily Ward, Emily Ward                        ACCOUNT NO.:  [id number]   MEDICAL RECORD NO.:  [id number]                   PATIENT TYPE:  INP   LOCATION:  0454                                 FACILITY:  [facility]   PHYSICIAN:  Currie Paris, M.D.           DATE OF BIRTH:  05-Jan-1956   DATE OF CONSULTATION:  DATE OF DISCHARGE:                           GENERAL SURGERY CONSULTATION   REASON FOR CONSULTATION:  Possible gallbladder problems.   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  She presented with some fever and  intermittent chills until Saturday when she saw Dr. Quintella Reichert who she says  found blood in her urine, thought she had some form of kidney infection, and  put her on some antibiotics.  Her fever has improved but she has continued  to have some discomfort in the upper abdomen.  Seems to be somewhat related  to some food.  She has noticed that her urine has gotten darker, closer to  an orange.  Her stools have become looser and much more light colored and  today she is noticing itching.  Because of her symptoms, she was seen and  then admitted yesterday because of the symptoms and finding of some elevated  liver functions and some pain.  Laboratory studies on admission showed a  white count of 6500 with a bilirubin up to 2.1, alkaline phosphatase 394,  AST 103, ALT 215.  Her examination on admission, per the note, showed some  right upper quadrant tenderness, but no guarding or rebound.   This morning the patient feels okay, but has just received some Demerol so  she thinks that may be contributing to her feeling of well being.   MEDICATIONS:  Significant that she has been on Minocin, BuSpar, codeine,  Depakote, Effexor, and Zomex.   PAST SURGICAL HISTORY:  Cesarean sections and hysteroscopy.   SOCIAL HISTORY:  She works as a  Metallurgist.  She does not use  alcohol or smoke.   REVIEW OF SYMPTOMS:  History of headaches, but otherwise is basically  negative.   PHYSICAL EXAMINATION:  GENERAL:  The patient is alert and oriented.  VITAL SIGNS:  She has been afebrile since admission.  HEENT:  Normocephalic.  Eyes:  Questionable minimal scleral icterus but this  is hard to discern.  NECK:  Supple.  No masses or thyromegaly are noted.  LUNGS:  Clear to auscultation.  HEART:  Regular.  ABDOMEN:  Basically soft and nondistended.  She may have a little bit of  tenderness epigastrium/right upper quadrant, but no guarding, no rebound and  I do not feel the liver or the gallbladder.  Bowel sounds are normal.  EXTREMITIES:  No cyanosis or edema.   LABORATORIES:  She has had an ultrasound of her gallbladder which showed a  somewhat contracted gallbladder and no comment really could be made about  the thickness of the wall therefore.  No definite stones were seen.  The  artery was normal and the common duct was normal measuring up to about 5 mm.   The patient also had a hepatobiliary scan which really shows no emptying  into the common duct and could be seen as high grade obstruction, but more  typical for hepatocellular dysfunction.   Other laboratory studies include today's bilirubin 2.3, slightly elevated  from yesterday's 2.1.  Direct is 1.4.  Alkaline phosphatase 366, SGOT 142,  SGPT 178, albumin somewhat low at 2.7.  Hepatitis C antibody is negative.  Hepatitis B antibody is negative.  Urinalysis shows large bilirubin,  moderate blood.  There is no CBC report today.  Amylase and lipase were  normal.   IMPRESSION:  Abdominal pain, liver function abnormalities, and hematuria.  I  do not see definite evidence that this is cholelithiasis, although her  symptoms are somewhat worrisome for a common duct stone given the epigastric  nature of the pain radiating to her back and the fever.  I suppose this  could  also be a hepatitis process.   At this point I would get a gastrointestinal evaluation because I think some  consideration ought to be given for an ERCP assuming that she continues to  have some abnormal liver functions and does not appear that this is  hepatitis.  If stones could be documented then I think she would then be a  candidate for laparoscopic cholecystectomy.                                               Currie Paris, M.D.    CJS/MEDQ  D:  04/22/2002  T:  04/22/2002  Job:  [id number]   cc:   Jerl Santos, M.D.

## 2011-01-19 NOTE — Op Note (Signed)
   NAME:  Emily Ward, Emily Ward NO.:  [id number]   MEDICAL RECORD NO.:  [id number]                   PATIENT TYPE:  AMB   LOCATION:  ENDO                                 [facility]AC[facility]   PHYSICIAN:  Georgiana Spinner, M.D.                 DATE OF BIRTH:  09-12-1955   DATE OF PROCEDURE:  05/21/2003  DATE OF DISCHARGE:                                 OPERATIVE REPORT   PROCEDURE:  Colonoscopy.   INDICATIONS:  Rectal bleeding, diarrhea.   ANESTHESIA:  Demerol 100, Versed 12 mg.   DESCRIPTION OF PROCEDURE:  With patient mildly sedated in the left lateral  decubitus position with pressure applied to the abdomen, the Olympus  videoscopic colonoscope was inserted in the rectum and passed under direct  vision to the cecum, identified by the ileocecal valve and appendiceal  orifice, both of which were photographed.  We entered into the terminal  ileum which also appeared normal and was photographed.  From this point, the  colonoscope was slowly withdrawn, taking circumferential views of the  colonic mucosa, stopping to take random colon biopsies along the way until  we reached approximately 20 cm from the anal verge, at which point a small  polyp was seen, photographed, and removed using biopsy forceps in the rectum  which appeared normal on direct view.  The endoscope was placed in  retroflexed view to view the anal canal from above, and internal hemorrhoids  were seen and photographed.  The endoscope was straightened and withdrawn.  The patient's vital signs and pulse oximeter remained stable.  The patient  tolerated the procedure well without apparent complications.   FINDINGS:  1. Internal hemorrhoids.  2. Otherwise, unremarkable colonoscopic examination including the terminal     ileum.   PLAN:  Have patient follow up with me as an outpatient.                                               Georgiana Spinner, M.D.    GMO/MEDQ  D:  05/21/2003  T:  05/21/2003   Job:  [id number]

## 2011-01-19 NOTE — H&P (Signed)
NAME:  Emily Ward, Emily Ward NO.:  [id number]   MEDICAL RECORD NO.:  [id number]          PATIENT TYPE:  INP   LOCATION:  9306                          FACILITY:  [facility]   PHYSICIAN:  Tera Mater. Clent Ridges, M.D. [facility] OF BIRTH:  February 08, 1956   DATE OF ADMISSION:  05/24/2004  DATE OF DISCHARGE:                                HISTORY & PHYSICAL   CHIEF COMPLAINT:  This is a 55 year old woman with diffuse abdominal cramps,  nausea and vomiting, and profuse diarrhea.   HISTORY OF PRESENT ILLNESS:  Seven days ago the patient began to have  diffuse abdominal cramping which seemed to be worse in the suprapubic and  right lower quadrant areas.  She had lots of nausea with this with some  occasional vomiting.  Her appetite was down but she was able to keep down  some fluids.  She had no fever at that time, no urinary tract infection  symptoms at that time, no back pain.  She was seen in the outpatient clinic  on September 19.  On exam at that time she had some mild to moderate  tenderness in the suprapubic area and right lower quadrant region of the  abdomen.  There was some rebound present.  Bowel sounds were normal.  Abdomen was soft.  No masses were palpated.  There was concern about  possible appendicitis at that time and she was sent for a contrasted  abdominal CT scan and pelvic CT scan later that day.  This came back as  completely normal with the appendix well visualized and a physiologic amount  of free fluid present in the pelvis.  She also had blood work that day  including a CBC, a metabolic panel, and an amylase, all within normal  limits.  At that time she was prescribed Nexium, Phenergan by mouth, and  Vicodin for her symptoms.  She is brought back in today in the clinic by her  husband, much worse over the past 24 hours.  She is very weak.  She now  cannot keep any liquids down due to vomiting and she has had a large  increase in the pain in her lower abdomen.  She still  has no fever and no  other symptoms.   PAST MEDICAL HISTORY:  1.  Gastroesophageal reflux disease.  2.  Diarrhea predominant irritable bowel syndrome.  3.  Migraine headaches.  4.  Hyperlipidemia.  5.  Depression with anxiety.  6.  She is status post cholecystectomy.   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   CURRENT MEDICATIONS:  1.  Prempro 0.625/2.5 once daily.  2.  Aspirin 81 mg daily.  3.  Lexapro 20 mg daily.  4.  Crestor 10 mg daily.  5.  Esgic Plus as needed.  6.  Lorcet 10/250 as needed.  7.  Alprazolam as needed.  8.  Over the past several days she has also been using Phenergan 25 mg      tablets as needed.  9.  Nexium 40 mg b.i.d.  10. Vicodin 5/500 as needed.   HABITS:  She  drinks a small amount of alcohol.  She smokes one half a pack  per day of cigarettes..   SOCIAL HISTORY:  She is married with children.  She is a Engineer, civil (consulting) with [employer].   FAMILY HISTORY:  Noncontributory.   PHYSICAL EXAMINATION:  GENERAL:  She is alert but in a lot of pain and quite  weak.  VITAL SIGNS:  Temperature 98.3 degrees, pulse 82 and regular, blood pressure  120/78, respirations 12 and comfortable.  SKIN:  Warm and dry.  HEENT:  Eyes are clear.  Ears are clear.  Oropharynx clear.  NECK:  Supple without lymphadenopathy or masses.  SKIN:  Clear.  There are no signs of jaundice.  LUNGS:  Clear.  CARDIAC:  Rate and rhythm are regular without gallops, murmurs, or rubs.  Distal pulses are full.  ABDOMEN:  Soft, normal bowel sounds.  No masses.  She is quite tender in the  suprapubic area and in the right lower quadrant region.  There is mild  rebound present.  There is no guarding, however.  No hepatosplenomegaly.  PELVIC:  On pelvic exam a small amount of white discharge in present in the  vaginal vault.  The cervix appears normal with no obvious discharge.  She  has exquisite cervical motion tenderness, however, on manipulation and  some  uterine tenderness, no masses are felt.  There is some mild adnexal  tenderness on the left as well as on the right with no masses felt.  RECTAL:  On rectal exam, no masses, no tenderness, and her stool which is  liquid and light brown in appearance is Hemoccult negative.   ASSESSMENT/PLAN:  Lower abdominal pain, probably pelvic inflammatory  disease.  We did obtain a DNA probe from the cervical os today which the  patient will transport with her to the hospital.  Will get a gonorrhea and  Chlamydia evaluation on this.  She has already collected some stool in a  stool kit and she will take this with her to the hospital to be evaluated  for bacterial cultures, Giardia, and clostridium difficile toxin.  Will  obtain blood cultures, CBC, complete metabolic panel, urinalysis, and  amylase again.  Will correct dehydration with IV fluids including D5 half  normal saline with some potassium.  Will control her nausea and her pain  with IV Phenergan and morphine sulfate.  Will begin Cefotetan IV and  doxycycline IV as well.      SAF/MEDQ  D:  05/24/2004  T:  05/24/2004  Job:  [id number]

## 2011-01-19 NOTE — Op Note (Signed)
Emily Ward, Emily Ward                       ACCOUNT NO.:  [id number]   MEDICAL RECORD NO.:  [id number]                   PATIENT TYPE:  INP   LOCATION:  0454                                 [facility]   PHYSICIAN:  Barrie Folk, M.D.                  DATE OF BIRTH:  [birth year]   DATE OF PROCEDURE:  04/23/2002  DATE OF DISCHARGE:                                 OPERATIVE REPORT   PROCEDURE:  Endoscopic retrograde cholangiopancreatography.   INDICATIONS FOR PROCEDURE:  Abdominal pain, elevated liver function tests  and abnormal PIPIDA scan suggesting the possibility of common bile duct  stones or other common bile duct obstruction.   DESCRIPTION OF PROCEDURE:  The patient was placed in the left lateral  decubitus position then placed on the pulse monitor with continuous low flow  oxygen delivered by nasal cannula. She was sedated with 70 mg IV Demerol and  7 mg IV Versed. The Olympus side viewing Olympus was advanced blindly into  the oropharynx, esophagus, and stomach. The stomach and duodenum appeared  normal. The pylorus was traversed and the papillar of Vater located on the  medial duodenal wall. It had a normal appearance. It was cannulated with the  Wilson-Cook sphinctertome. No pancreatic duct cannulation was done. A  cholangiogram was done which showed a smooth normal caliber common bile duct  and intrahepatic ducts. The gallbladder filled readily and was filled with  several syringes and no stones or other filling defects were seen. After  several injections of dye, we noticed some small round filling defects in  the distal duct and it was unclear whether these were bubbles or stones but  they did not rise when the patient was put in reverse Trendelenburg. We  therefore assumed they were stones and performed a small sphincterotomy. An  8.5 mm balloon was then passed part of the way up the duct, inflated and  dragged down with no stones seen to be delivered but some  small bubbles  seen. Repeat cholangiogram with the balloon in place revealed no other  suspicious filling defects. On a final balloon inflation, there was obvious  air introduced in the biliary tree and a balloon sweep was done once more  with delivery of bubbles only. Review of the initial cholangiograms with the  radiologist before dye had been injected and balloon catheter passed  revealed no filling defects at all. There was clear amber bile seen to drain  from the sphincterotomized papilla at the end of the procedure. The scope  was then withdrawn and the patient returned to the recovery room in stable  condition. The patient tolerated the procedure well and there were no  immediate complications.   IMPRESSION:  Normal common bile duct, intrahepatic ducts and gallbladder.    PLAN:  Repeat liver function tests tomorrow morning and will obtain  hepatitis A antibody and will discontinue her antibiotics.  Barrie Folk, M.D.    JCH/MEDQ  D:  04/23/2002  T:  04/24/2002  Job:  [id number]   cc:   Lazaro Arms, MD  [facility]  [address] [place], [place] [address]  Fax: [phone]   Currie Paris, M.D.  Fax: [phone]

## 2011-01-19 NOTE — Discharge Summary (Signed)
NAME:  SHELDA, TRUBY NO.:  [id number]   MEDICAL RECORD NO.:  [id number]          PATIENT TYPE:  INP   LOCATION:  9306                          FACILITY:  [facility]   PHYSICIAN:  Rene Paci, M.D. LHCDATE OF BIRTH:  01-05-1956   DATE OF ADMISSION:  05/24/2004  DATE OF DISCHARGE:  05/25/2004                                 DISCHARGE SUMMARY   DISCHARGE DIAGNOSES:  1.  Abdominal cramping.  2.  Nausea and vomiting.  3.  Diarrhea.  4.  Dehydration.  5.  Irritable bowel syndrome.   BRIEF ADMISSION HISTORY:  Ms. Dias is a 55 year old white female with a  history of irritable bowel syndrome.  The patient presented after 7 days of  diffuse abdominal cramping.  This was associated with nausea and occasional  vomiting.  She describes diminished p.o. intake.  She describes profuse  diarrhea.   PAST MEDICAL HISTORY:  1.  Gastroesophageal reflux disease.  2.  Diarrhea-predominant irritable bowel syndrome.  3.  Migraine headaches.  4.  Hyperlipidemia.  5.  Depression and anxiety.  6.  Status post cholecystectomy.   HOSPITAL COURSE:  #1 - GASTROINTESTINAL:  The patient presented with  abdominal pain, nausea, vomiting, and diarrhea.  She was admitted for  further evaluation.  This included a CT of the abdomen and pelvis.  This was  performed on May 22, 2004.  Her CT was negative for any acute  abnormalities, no renal calculi or hydronephrosis.  The CT of the pelvis was  also negative.  Appendix was well visualized.  No pelvic abnormalities.  The  patient was seen in consultation by gastroenterology.  Dr. Virginia Rochester suspected the  patient had irritable bowel syndrome with recent worsening.  He recommended  continuing Imodium and a low residue diet.  The patient's symptoms have  significantly improved.  Her nausea and vomiting have resolved.  Her  abdominal pain has resolved, and her diarrhea resolved.  The patient is  stable for discharge home.   #2 - DEHYDRATION:   Likely secondary to her decreased p.o. intake,  exacerbated by her diarrhea.  This was corrected with IV fluids.  The  patient is symptomatically improved.   LABORATORY DATA AT DISCHARGE:  GC, Chlamydia was negative.  Stool for C.  difficile toxin was negative.  Urinalysis was negative.  Amylase was normal.  Potassium 3.3.  CBC with differential was normal.  Glucose was 195.  Blood  cultures are negative x 2.   MEDICATIONS AT DISCHARGE:  She is to resume her home medications which are:  1.  Prempro 0.625/2.5 daily.  2.  Aspirin 81 mg daily.  3.  Lexapro 20 mg daily.  4.  Crestor 10 mg daily.  5.  Esgic Plus p.r.n.  6.  Lorcet 10/250 p.r.n..  7.  Xanax p.r.n.  8.  Phenergan p.r.n.  9.  Nexium 40 mg b.i.d.  10. Vicodin 5/500 p.r.n.  11. The patient is not interested in any antispasmodic medications at this      time.   FOLLOW UP:  With Dr. Clent Ridges or as needed.  LC/MEDQ  D:  05/26/2004  T:  05/27/2004  Job:  [id number]   cc:   Jeannett Senior A. Clent Ridges, M.D. [facility]   Georgiana Spinner, M.D.  [address]  [place]  [place] [address]  Fax: [phone]

## 2011-01-19 NOTE — Op Note (Signed)
   NAME:  Emily Ward, Emily Ward                        ACCOUNT NO.:  [id number]   MEDICAL RECORD NO.:  [id number]                   PATIENT TYPE:  AMB   LOCATION:  ENDO                                 FACILITY:  [facility]   PHYSICIAN:  Georgiana Spinner, M.D.                 DATE OF BIRTH:  [birth year]   DATE OF PROCEDURE:  DATE OF DISCHARGE:                                 OPERATIVE REPORT   PROCEDURE:  Upper endoscopy with biopsy.   INDICATIONS:  Gastric ulcers.   ANESTHESIA:  Demerol 60, Versed 9 mg.   DESCRIPTION OF PROCEDURE:  With the patient mildly sedated, in the left  lateral decubitus position, the Olympus videoscopic endoscope was inserted  in the mouth, passed under direct vision through the esophagus which  appeared normal into the stomach.  Fundus appeared to show changes of  erythema and snakeskinning which we biopsied.  The body of the stomach  appeared normal, however.  In the antrum, two ulcers were seen on the  greater curvature anterior wall of the stomach just proximal to the pylorus.  These were photographed and biopsies taken.  They appeared to be rather  smooth, yellow-based, punched-out ulcerations.  Duodenal bulb and second  portion of the duodenum appeared normal.  From this point, the endoscope was  slowly withdrawn taking circumferential views of the entire duodenal mucosa,  visualized until the endoscope was pulled back, and the stomach placed in  retroflexion to view the stomach from below.  The endoscope was then  straightened and withdrawn taking circumferential views of the remaining  gastric and esophageal mucosa as visualized.  The patient vital signs and  pulse oximeter remained stable.  The patient tolerated the procedure well  without apparent complications.   FINDINGS:  Ulcers of antrum, biopsied.  Changes of snakeskinning of the  fundus of the stomach consistent with a gastritis, biopsied.  Await biopsy  reports.  The patient will call me for results and  follow up with me as an  outpatient.  Of note, the patient is having recurrence of her diarrhea-  predominant irritable bowel syndrome, and we will discuss this and evaluate  further when she returns as an outpatient.                                               Georgiana Spinner, M.D.    GMO/MEDQ  D:  10/16/2002  T:  10/16/2002  Job:  [id number]   cc:   Lacretia Leigh. Quintella Reichert, M.D.  [facility] [place]  [place] [address]  Fax: [phone]

## 2011-01-19 NOTE — Discharge Summary (Signed)
NAME:  Emily Ward, Emily Ward NO.:  [id number]   MEDICAL RECORD NO.:  [id number]          PATIENT TYPE:  OIB   LOCATION:  3005                         FACILITY:  [facility]   PHYSICIAN:  Coletta Memos, M.D.     DATE OF BIRTH:  21-Jul-1956   DATE OF ADMISSION:  05/30/2005  DATE OF DISCHARGE:  06/02/2005                                 DISCHARGE SUMMARY   ADMISSION DIAGNOSIS:  Cervical displaced disk, C5-6, left.   POSTOPERATIVE DIAGNOSES:  1.  Cervical displaced disk, C5-6, left.  2.  Left C6 radiculopathy.   PROCEDURE:  1.  Anterior cervical decompression, C5-6.  2.  Arthrodesis, C5-6.  3.  A 7 mm allograft anterior plate with Synthes ACCS plate.   COMPLICATIONS:  None.   DISCHARGE STATUS:  Alive and well.   DISCHARGE DESTINATION:  Home.   SURGEON:  Coletta Memos, M.D.   INDICATIONS:  [name] is a woman who presented with severe pain in the  left upper extremity.  She was taken to the operating room and had an  uncomplicated procedure on May 30, 2005 for a displaced cyst on the  left side; however, postoperatively, she had a severe headache.  The  headache was so severe that it brought her to tears.  There was certainly no  untoward event during her operation, which would have led to the headache.  It seems far out of proportion to the typical posterior cervical pain that  people have after this procedure.  I did do a head CT last evening, which  was normal.  She awoke on the 29th with a headache.  She was given some  Valium and two Vicodin and woke up later during the day and had absolutely  no pain.  She was doing well.  I will DC her IV.  She will be discharged  home on June 02, 2005.   Discharge medicines include Vicodin and Flexeril.   Her wound is clean and dry without signs of infection at discharge.  She has  5/5 strength in the upper extremities.  She is also voiding and tolerating a  regular diet.            ______________________________  Coletta Memos, M.D.    KC/MEDQ  D:  06/01/2005  T:  06/01/2005  Job:  [id number]

## 2011-01-19 NOTE — Op Note (Signed)
NAME:  Emily Ward, Emily Ward NO.:  [id number]   MEDICAL RECORD NO.:  [id number]          PATIENT TYPE:  OIB   LOCATION:  3005                         FACILITY:  [facility]   PHYSICIAN:  Coletta Memos, M.D.     DATE OF BIRTH:  [birth year]   DATE OF PROCEDURE:  05/30/2005  DATE OF DISCHARGE:                                 OPERATIVE REPORT   PREOPERATIVE DIAGNOSES:  1.  Left C6 radiculopathy.  2.  Displaced disk, left C5-6.   POSTOPERATIVE DIAGNOSES:  1.  Left C6 radiculopathy.  2.  Displaced disk, left C5-6.   OPERATION PERFORMED:  1.  Anterior cervical decompression C5-6.  2.  Arthrodesis, C5-6 with 7 mm allograft.  3.  Anterior plating with 14 mm Synthes ACCS plate.  Anterior cervical      compression system plate.   SURGEON:  Coletta Memos, M.D.   ASSISTANT:  Hewitt Shorts, M.D.   ANESTHESIA:  General.   COMPLICATIONS:  None.   INDICATIONS FOR PROCEDURE:  Emily Ward is a woman with severe pain in  the left upper extremity.  She had had a previous myelogram this summer  which did not show a problem at C5-6.  She then developed pain in the left  upper extremity, had MRI and this showed a new disk herniation there.  I  then recommended and she agreed to undergo operative decompression.   DESCRIPTION OF PROCEDURE:  Emily Ward was brought to the operating room  intubated and placed under general anesthetic without difficulty.  Head was  placed in slight extension on a horseshoe head rest.  Her neck was prepped  and she was draped in sterile fashion.  I opened the skin with a #10 blade  and took this down to the platysma.  I dissected rostrally and caudally  superior to the platysma.  I opened the platysma in a horizontal fashion  using Metzenbaum scissors.  I then dissected rostrally and caudally inferior  to the platysma with the Metzenbaum scissors.  I then created an avascular  plane to the anterior cervical spine.  I placed the spinal needle into  the  disk space which I had exposed and x-ray showed that to be at C5-6.  I then  used monopolar cautery to reflect the longus colli muscle bilaterally.  I  placed self-retaining retractors.  I then placed distraction pins, one in  C5, the other in C6 and proceeded with the diskectomy at that level.  I used  both 15 blade, curettes, high speed drill and Kerrison punch to perform the  diskectomy.  I removed what was a surprising amount of spondylitic change  and thickened ligament on both the right and left sides.  I decompressed  both sides until the nerve hillock was easily seen and until I had free  egress of a black probe out the neural foramen.  I then with Dr. Earl Gala  assistance, placed 7 mm allograft.  We then placed a 14 mm plate, two screws  in C5, two in C6. The distractions pins have been removed already.  I then  removed  the self-retaining retractor.  X-ray showed the plate, plug and  screw to be in good position.  I irrigated the wound.  We then closed the  wound in layered fashion using Vicryl sutures.  Dermabond used for sterile  dressing.May 30, 2005           ______________________________  Coletta Memos, M.D.     KC/MEDQ  D:  05/30/2005  T:  05/31/2005  Job:  [id number]

## 2011-01-19 NOTE — Op Note (Signed)
   NAME:  Emily Ward, Emily Ward                        ACCOUNT NO.:  [id number]   MEDICAL RECORD NO.:  [id number]                   PATIENT TYPE:  AMB   LOCATION:  ENDO                                 FACILITY:  [facility]   PHYSICIAN:  Georgiana Spinner, M.D.                 DATE OF BIRTH:  Dec 01, 1955   DATE OF PROCEDURE:  07/01/2002  DATE OF DISCHARGE:                                 OPERATIVE REPORT   PROCEDURE:  Upper endoscopy.   INDICATIONS:  Abdominal pain.   ANESTHESIA:  Demerol 80, Versed 9 mg.   DESCRIPTION OF PROCEDURE:  With the patient mildly sedated in the left  lateral decubitus position, the Olympus videoscopic endoscope was inserted  into the mouth and passed under direct vision through the esophagus which  appeared normal.  There was no evidence of Barrett's, and the GE junction  area was photographed.  We entered into the stomach.  Fundus, body appeared  normal.  In the antrum, however, on the anterior surface, there was a  punched out ulceration; the borders were smooth.  The base was white.  There  was little in the way of surrounding inflammatory changes, and this too was  photographed, and subsequently the margins were biopsied.  Entered into the  duodenal bulb, through the pyloric channel.  Duodenum and second portion of  duodenum appeared relatively normal and were photographed.  From this point,  the endoscope was slowly withdrawn, taking circumferential views of duodenal  mucosa until the endoscope then pulled back into the stomach, placed in  retroflexion to view the stomach from below, and this appeared normal.  There was no evidence of hiatal hernia.  The endoscope was then straightened  and withdrawn, taking circumferential views of the remaining gastric and  esophageal mucosa.  The patient's vital signs and pulse oximeter remained  stable.  The patient tolerated the procedure well without apparent  complications.   FINDINGS:  Gastric ulceration in the antrum,  anterior wall.   PLAN:  Await biopsy report.  The patient will call me for results of biopsy  and follow up with me as an outpatient.  We will treat with proton pump  inhibitor and antibiotics as deemed necessary.                                               Georgiana Spinner, M.D.    GMO/MEDQ  D:  07/01/2002  T:  07/01/2002  Job:  [id number]   cc:   Lacretia Leigh. Quintella Reichert, M.D.   Currie Paris, M.D.  Fax: [phone]

## 2011-02-05 ENCOUNTER — Other Ambulatory Visit: Payer: Self-pay | Admitting: Family Medicine

## 2011-03-05 ENCOUNTER — Other Ambulatory Visit: Payer: Self-pay | Admitting: Family Medicine

## 2011-03-08 ENCOUNTER — Other Ambulatory Visit: Payer: Self-pay | Admitting: Family Medicine

## 2011-03-09 ENCOUNTER — Other Ambulatory Visit: Payer: Self-pay | Admitting: Family Medicine

## 2011-03-09 MED ORDER — ZOLPIDEM TARTRATE 10 MG PO TABS: 10.0000 mg | ORAL_TABLET | Freq: Every evening | ORAL | Status: DC | PRN

## 2011-03-09 MED ORDER — BUTALBITAL-APAP-CAFFEINE 50-500-40 MG PO TABS: 1.0000 | ORAL_TABLET | Freq: Four times a day (QID) | ORAL | Status: DC | PRN

## 2011-03-09 NOTE — Telephone Encounter (Signed)
rx faxed to pharmacy

## 2011-03-15 ENCOUNTER — Encounter: Payer: Self-pay | Admitting: Family Medicine

## 2011-03-27 ENCOUNTER — Other Ambulatory Visit (INDEPENDENT_AMBULATORY_CARE_PROVIDER_SITE_OTHER): Payer: 59

## 2011-03-27 ENCOUNTER — Other Ambulatory Visit: Payer: 59

## 2011-03-27 DIAGNOSIS — Z0000 Encounter for general adult medical examination without abnormal findings: Secondary | ICD-10-CM

## 2011-03-27 LAB — POCT URINALYSIS DIPSTICK
Bilirubin, UA: NEGATIVE
Glucose, UA: NEGATIVE
Ketones, UA: NEGATIVE
Leukocytes, UA: NEGATIVE
Nitrite, UA: NEGATIVE
Protein, UA: NEGATIVE
Spec Grav, UA: 1.02
Urobilinogen, UA: 0.2
pH, UA: 5.5

## 2011-03-27 LAB — HEPATIC FUNCTION PANEL
ALT: 8 U/L (ref 0–35)
AST: 17 U/L (ref 0–37)
Albumin: 3.9 g/dL (ref 3.5–5.2)
Alkaline Phosphatase: 103 U/L (ref 39–117)
Bilirubin, Direct: 0.1 mg/dL (ref 0.0–0.3)
Total Bilirubin: 0.3 mg/dL (ref 0.3–1.2)
Total Protein: 7.7 g/dL (ref 6.0–8.3)

## 2011-03-27 LAB — BASIC METABOLIC PANEL
BUN: 12 mg/dL (ref 6–23)
CO2: 29 mEq/L (ref 19–32)
Calcium: 9.7 mg/dL (ref 8.4–10.5)
Chloride: 111 mEq/L (ref 96–112)
Creatinine, Ser: 0.6 mg/dL (ref 0.4–1.2)
GFR: 118.11 mL/min (ref >60.00)
Glucose, Bld: 101 mg/dL — ABNORMAL HIGH (ref 70–99)
Potassium: 4.2 mEq/L (ref 3.5–5.1)
Sodium: 147 mEq/L — ABNORMAL HIGH (ref 135–145)

## 2011-03-27 LAB — CBC WITH DIFFERENTIAL/PLATELET
Basophils Absolute: 0 10*3/uL (ref 0.0–0.1)
Basophils Relative: 0.4 % (ref 0.0–3.0)
Eosinophils Absolute: 0.1 10*3/uL (ref 0.0–0.7)
Eosinophils Relative: 1.2 % (ref 0.0–5.0)
HCT: 43.7 % (ref 36.0–46.0)
Hemoglobin: 14.6 g/dL (ref 12.0–15.0)
Lymphocytes Relative: 34.5 % (ref 12.0–46.0)
Lymphs Abs: 3.2 10*3/uL (ref 0.7–4.0)
MCHC: 33.3 g/dL (ref 30.0–36.0)
MCV: 96.7 fl (ref 78.0–100.0)
Monocytes Absolute: 0.5 10*3/uL (ref 0.1–1.0)
Monocytes Relative: 4.9 % (ref 3.0–12.0)
Neutro Abs: 5.5 10*3/uL (ref 1.4–7.7)
Neutrophils Relative %: 59 % (ref 43.0–77.0)
Platelets: 237 10*3/uL (ref 150.0–400.0)
RBC: 4.52 Mil/uL (ref 3.87–5.11)
RDW: 13.9 % (ref 11.5–14.6)
WBC: 9.3 10*3/uL (ref 4.5–10.5)

## 2011-03-27 LAB — LIPID PANEL
Cholesterol: 183 mg/dL (ref 0–200)
HDL: 60.6 mg/dL (ref >39.00)
LDL Cholesterol: 102 mg/dL — ABNORMAL HIGH (ref 0–99)
Total CHOL/HDL Ratio: 3
Triglycerides: 104 mg/dL (ref 0.0–149.0)
VLDL: 20.8 mg/dL (ref 0.0–40.0)

## 2011-03-27 LAB — TSH: TSH: 1.81 u[IU]/mL (ref 0.35–5.50)

## 2011-04-03 ENCOUNTER — Other Ambulatory Visit (HOSPITAL_COMMUNITY)
Admission: RE | Admit: 2011-04-03 | Discharge: 2011-04-03 | Disposition: A | Discharge status: Home / Self Care | Payer: 59 | Source: Ambulatory Visit | Attending: Family Medicine | Admitting: Family Medicine

## 2011-04-03 ENCOUNTER — Ambulatory Visit (INDEPENDENT_AMBULATORY_CARE_PROVIDER_SITE_OTHER): Payer: 59 | Admitting: Family Medicine

## 2011-04-03 ENCOUNTER — Encounter: Payer: Self-pay | Admitting: Family Medicine

## 2011-04-03 ENCOUNTER — Other Ambulatory Visit: Payer: Self-pay | Admitting: Family Medicine

## 2011-04-03 VITALS — BP 118/78 | Temp 98.7°F | Ht 65.0 in | Wt 116.0 lb

## 2011-04-03 DIAGNOSIS — Z0000 Encounter for general adult medical examination without abnormal findings: Secondary | ICD-10-CM

## 2011-04-03 DIAGNOSIS — Z01419 Encounter for gynecological examination (general) (routine) without abnormal findings: Secondary | ICD-10-CM | POA: Insufficient documentation

## 2011-04-03 DIAGNOSIS — G44009 Cluster headache syndrome, unspecified, not intractable: Secondary | ICD-10-CM

## 2011-04-03 DIAGNOSIS — G4700 Insomnia, unspecified: Secondary | ICD-10-CM

## 2011-04-03 DIAGNOSIS — E785 Hyperlipidemia, unspecified: Secondary | ICD-10-CM

## 2011-04-03 DIAGNOSIS — M797 Fibromyalgia: Secondary | ICD-10-CM

## 2011-04-03 DIAGNOSIS — IMO0001 Reserved for inherently not codable concepts without codable children: Secondary | ICD-10-CM

## 2011-04-03 MED ORDER — CONJ ESTROG-MEDROXYPROGEST ACE 0.625-2.5 MG PO TABS: 1.0000 | ORAL_TABLET | Freq: Every day | ORAL | Status: DC

## 2011-04-03 MED ORDER — BUTALBITAL-APAP-CAFFEINE 50-500-40 MG PO TABS: 1.0000 | ORAL_TABLET | Freq: Four times a day (QID) | ORAL | Status: DC | PRN

## 2011-04-03 MED ORDER — PANTOPRAZOLE SODIUM 40 MG PO TBEC: 40.0000 mg | DELAYED_RELEASE_TABLET | Freq: Every day | ORAL | Status: DC

## 2011-04-03 MED ORDER — ZOLPIDEM TARTRATE 10 MG PO TABS: 10.0000 mg | ORAL_TABLET | Freq: Every evening | ORAL | Status: DC | PRN

## 2011-04-03 MED ORDER — ROSUVASTATIN CALCIUM 10 MG PO TABS: 10.0000 mg | ORAL_TABLET | Freq: Every day | ORAL | Status: DC

## 2011-04-03 MED ORDER — CYCLOBENZAPRINE HCL 10 MG PO TABS: 10.0000 mg | ORAL_TABLET | Freq: Three times a day (TID) | ORAL | Status: DC | PRN

## 2011-04-03 MED ORDER — ZOLMITRIPTAN 5 MG PO TABS: 5.0000 mg | ORAL_TABLET | ORAL | Status: DC | PRN

## 2011-04-03 MED ORDER — HYDROCODONE-ACETAMINOPHEN 10-325 MG PO TABS: 1.0000 | ORAL_TABLET | Freq: Four times a day (QID) | ORAL | Status: DC | PRN

## 2011-04-09 ENCOUNTER — Telehealth: Payer: Self-pay | Admitting: Family Medicine

## 2011-04-09 ENCOUNTER — Telehealth: Payer: Self-pay

## 2011-04-09 ENCOUNTER — Encounter: Payer: Self-pay | Admitting: Family Medicine

## 2011-04-09 MED ORDER — BUPROPION HCL ER (XL) 150 MG PO TB24: 150.0000 mg | ORAL_TABLET | Freq: Every day | ORAL | Status: DC

## 2011-04-09 NOTE — Telephone Encounter (Signed)
Pt called and said that Dr [name] told pt during ov that he was going to call in some Wellbutrin to [facility] on [address], but nothing has been called in yet. Pls call in asap.

## 2011-04-09 NOTE — Telephone Encounter (Signed)
rx sent to pharmacy

## 2011-04-09 NOTE — Progress Notes (Signed)
  Subjective:    Patient ID: Emily Ward, female    DOB: October 13, 1955, 55 y.o.   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 She relates she has  fibromyalgia and is on Cymbalta and Lyrica and is under the treatment of a rheumatologist Dr. Cornelious Bryant. Systems review reveals she has headaches , postmenopausal symptoms and is on Prempro hyperlipidemia and is on Crestor has headaches treated with eitherZomig  or Esgic-Plus HPI    Review of Systems CHPI     Objective:   Physical Exam the patient is a thin but well-developed well-nourished white female who is very pleasant cooperative and in no distress HEENT normocephalic, eyes negative for nose normal nasal mucosa, ears canals and tympanic membranes normal, carotid pulses good thyroid is nonpalpable Breast no tenderness no masses nipples everted axilla clear Lungs there to palpation percussion and auscultation no rales heard no dullness no wheezing Heart no evidence cardiomegaly heart sounds good without murmurs regular rhythm peripheral pulses are good and equal bilaterally Abdomen scaphoid abdomen liver spleen kidneys are nonpalpable no masses no tenderness bowel sounds normal  Pelvic examination external introitis slightly atrophic vaginal mucosa normal absent uterus adnexa appears negative Pap smear done Rectal exam negative no masses palpabl Extremities negative Muscle examination negative Neurological no positive finding reflexes 2+ bilaterally and equal         Assessment & Plan:  Routine physical reveals a healthy female with multiple medical problems laboratory studies  Normal Fibromyalgia doing well on the care of Dr. Raynelle Chary, and tender Cymbalta and Lyrica Headaches continue Esgic-Plus or Zomig Insomnia continue Ambien if needed Hyperlipidemia continue Crestor Postmenopausal symptoms continue Prempro

## 2011-04-09 NOTE — Patient Instructions (Signed)
In general feeling you're doing for her well with her medical problems continue same treatment if any problems call or return to the office A refill medications

## 2011-04-09 NOTE — Telephone Encounter (Signed)
Called to give pt lab results. Left a message for pt to return call.  

## 2011-04-10 NOTE — Telephone Encounter (Signed)
Pt aware of labs  

## 2011-04-10 NOTE — Progress Notes (Signed)
Pt aware.

## 2011-05-10 ENCOUNTER — Other Ambulatory Visit: Payer: Self-pay | Admitting: Family Medicine

## 2011-05-16 ENCOUNTER — Encounter: Payer: Self-pay | Admitting: Family Medicine

## 2011-05-16 ENCOUNTER — Ambulatory Visit (INDEPENDENT_AMBULATORY_CARE_PROVIDER_SITE_OTHER): Payer: 59 | Admitting: Family Medicine

## 2011-05-16 VITALS — BP 108/62 | HR 109 | Temp 99.5°F | Wt <= 1120 oz

## 2011-05-16 DIAGNOSIS — J329 Chronic sinusitis, unspecified: Secondary | ICD-10-CM

## 2011-05-16 MED ORDER — AZITHROMYCIN 250 MG PO TABS
ORAL_TABLET | ORAL | Status: AC
Start: 2011-05-16 — End: 2011-05-21

## 2011-05-16 NOTE — Progress Notes (Signed)
  Subjective:    Patient ID: Ileana Ladd, female    DOB: [birth year], 55 y.o.   MRN: [id number]  HPI Here for one week of body aches, fever to 101 degrees, HA, sinus pressure, and a dry cough. On Mucinex D.    Review of Systems  Constitutional: Positive for fever.  HENT: Positive for congestion, postnasal drip and sinus pressure.   Eyes: Negative.   Respiratory: Positive for cough.        Objective:   Physical Exam  Constitutional:       Ill appearing   HENT:  Right Ear: External ear normal.  Left Ear: External ear normal.  Nose: Nose normal.  Mouth/Throat: Oropharynx is clear and moist. No oropharyngeal exudate.  Eyes: Conjunctivae are normal. Pupils are equal, round, and reactive to light.  Neck: Neck supple. No thyromegaly present.  Pulmonary/Chest: Effort normal and breath sounds normal.  Lymphadenopathy:    She has no cervical adenopathy.          Assessment & Plan:  Rest, fluids. Off work today and tomorrow

## 2011-05-25 LAB — URINALYSIS, ROUTINE W REFLEX MICROSCOPIC
Bilirubin Urine: NEGATIVE
Glucose, UA: NEGATIVE
Ketones, ur: NEGATIVE
Nitrite: NEGATIVE
Protein, ur: NEGATIVE
Specific Gravity, Urine: 1.013
Urobilinogen, UA: 0.2
pH: 5.5

## 2011-05-25 LAB — URINE MICROSCOPIC-ADD ON

## 2011-05-25 LAB — DIFFERENTIAL
Basophils Absolute: 0
Basophils Relative: 0
Eosinophils Absolute: 0.1
Eosinophils Relative: 2
Lymphocytes Relative: 23
Lymphs Abs: 1.5
Monocytes Absolute: 0.3
Monocytes Relative: 5
Neutro Abs: 4.4
Neutrophils Relative %: 70

## 2011-05-25 LAB — CBC
HCT: 43.2
Hemoglobin: 14.9
MCHC: 34.5
MCV: 93.7
Platelets: 215
RBC: 4.61
RDW: 14.4
WBC: 6.4

## 2011-05-25 LAB — BASIC METABOLIC PANEL
BUN: 9
CO2: 27
Calcium: 9.1
Chloride: 107
Creatinine, Ser: 0.58
GFR calc Af Amer: >60
GFR calc non Af Amer: >60
Glucose, Bld: 104 — ABNORMAL HIGH
Potassium: 4.2
Sodium: 141

## 2011-05-25 LAB — URINE CULTURE
Colony Count: NO GROWTH
Culture: NO GROWTH

## 2011-05-25 LAB — PREGNANCY, URINE: Preg Test, Ur: NEGATIVE

## 2011-05-29 LAB — POCT HEMOGLOBIN-HEMACUE
Hemoglobin: 15.8 — ABNORMAL HIGH
Operator id: 134391

## 2011-10-01 ENCOUNTER — Other Ambulatory Visit: Payer: Self-pay

## 2011-10-01 MED ORDER — CITALOPRAM HYDROBROMIDE 20 MG PO TABS: 20.0000 mg | ORAL_TABLET | Freq: Every day | ORAL | Status: DC

## 2011-10-01 NOTE — Telephone Encounter (Signed)
Script called in

## 2011-10-01 NOTE — Telephone Encounter (Signed)
Last OV 05/16/11 for sinusitis. CPE 04/03/11. Last filled 04/03/11

## 2011-10-01 NOTE — Telephone Encounter (Signed)
Call in a 6 month supply  

## 2011-10-03 ENCOUNTER — Other Ambulatory Visit: Payer: Self-pay | Admitting: Family Medicine

## 2011-10-03 NOTE — Telephone Encounter (Signed)
Pt called req refill of zolpidem (AMBIEN) 10 MG tablet to [facility] on [address] [phone]

## 2011-10-04 ENCOUNTER — Telehealth: Payer: Self-pay | Admitting: Family Medicine

## 2011-10-04 MED ORDER — ZOLPIDEM TARTRATE 10 MG PO TABS: 10.0000 mg | ORAL_TABLET | Freq: Every evening | ORAL | Status: DC | PRN

## 2011-10-04 NOTE — Telephone Encounter (Signed)
We received a electronic request from [facility] for the Celexa, that is why it was sent in. I will call to cancel that order and also take it off of the medication list.

## 2011-10-04 NOTE — Telephone Encounter (Signed)
Please call in Zolpidem to [facility]. See previous note which stated this same request. Citalopram was called in by mistake, she stated. She also stated that she does take Citalopram. Thanks.

## 2011-10-04 NOTE — Telephone Encounter (Signed)
Script called in and cancelled the Celexa, left voice message for pt.

## 2011-10-04 NOTE — Telephone Encounter (Signed)
See my reply from yesterday

## 2011-10-04 NOTE — Telephone Encounter (Signed)
Call in Zolpidem 10 mg qhs, #30 with 5 rf 

## 2011-10-22 ENCOUNTER — Other Ambulatory Visit: Payer: Self-pay | Admitting: Family Medicine

## 2011-10-24 NOTE — Telephone Encounter (Signed)
Duplicate

## 2011-10-24 NOTE — Telephone Encounter (Signed)
Call in #120 with 5 rf 

## 2011-10-24 NOTE — Telephone Encounter (Signed)
Pt is aware nurse will call rx

## 2011-12-18 ENCOUNTER — Telehealth: Payer: Self-pay | Admitting: Family Medicine

## 2011-12-18 NOTE — Telephone Encounter (Signed)
Refill request for Chlorzoxazone 500 mg take 1 po qid and pt last here on 05/16/11.

## 2011-12-19 NOTE — Telephone Encounter (Signed)
Call in Clorzoxasone (Parafon) 500 mg q 6 hours prn spasm, #120 with 5 rf

## 2011-12-21 MED ORDER — CHLORZOXAZONE 500 MG PO TABS: 500.0000 mg | ORAL_TABLET | Freq: Four times a day (QID) | ORAL | Status: AC | PRN

## 2011-12-21 NOTE — Telephone Encounter (Signed)
I sent script e-scribe. 

## 2012-01-14 ENCOUNTER — Telehealth: Payer: Self-pay | Admitting: Family Medicine

## 2012-01-14 NOTE — Telephone Encounter (Signed)
Refill request for Norco 10-325 mg take 1 po q6hrs prn and pt last here on 05/16/11.

## 2012-01-15 MED ORDER — HYDROCODONE-ACETAMINOPHEN 10-325 MG PO TABS: 1.0000 | ORAL_TABLET | Freq: Four times a day (QID) | ORAL | Status: DC | PRN

## 2012-01-15 NOTE — Telephone Encounter (Signed)
I called in script 

## 2012-01-15 NOTE — Telephone Encounter (Signed)
Call in #120 with 5 rf 

## 2012-03-30 ENCOUNTER — Other Ambulatory Visit: Payer: Self-pay | Admitting: Family Medicine

## 2012-03-31 NOTE — Telephone Encounter (Signed)
Can we refill this? 

## 2012-04-01 ENCOUNTER — Telehealth: Payer: Self-pay | Admitting: Family Medicine

## 2012-04-01 NOTE — Telephone Encounter (Signed)
Refill request for Esgic-Plus 50-500-40 mg take 1 po q6hrs prn and Prempro 0.625-2.5 mg take 1 po qd and last here on 05/16/11.

## 2012-04-01 NOTE — Telephone Encounter (Signed)
Refill Prempro for one year (we refilled the esgic on electronic refills)

## 2012-04-01 NOTE — Telephone Encounter (Signed)
Call in #120 with 5 rf 

## 2012-04-02 MED ORDER — CONJ ESTROG-MEDROXYPROGEST ACE 0.625-2.5 MG PO TABS: 1.0000 | ORAL_TABLET | Freq: Every day | ORAL | Status: DC

## 2012-04-02 NOTE — Telephone Encounter (Signed)
I sent script e-scribe. 

## 2012-04-03 ENCOUNTER — Other Ambulatory Visit (INDEPENDENT_AMBULATORY_CARE_PROVIDER_SITE_OTHER): Payer: 59

## 2012-04-03 DIAGNOSIS — Z0000 Encounter for general adult medical examination without abnormal findings: Secondary | ICD-10-CM

## 2012-04-03 LAB — POCT URINALYSIS DIPSTICK
Bilirubin, UA: NEGATIVE
Glucose, UA: NEGATIVE
Ketones, UA: NEGATIVE
Leukocytes, UA: NEGATIVE
Nitrite, UA: NEGATIVE
Protein, UA: NEGATIVE
Spec Grav, UA: 1.02
Urobilinogen, UA: 0.2
pH, UA: 5.5

## 2012-04-03 LAB — BASIC METABOLIC PANEL
BUN: 14 mg/dL (ref 6–23)
CO2: 27 mEq/L (ref 19–32)
Calcium: 9.8 mg/dL (ref 8.4–10.5)
Chloride: 106 mEq/L (ref 96–112)
Creatinine, Ser: 0.5 mg/dL (ref 0.4–1.2)
GFR: 143.46 mL/min (ref >60.00)
Glucose, Bld: 87 mg/dL (ref 70–99)
Potassium: 4.5 mEq/L (ref 3.5–5.1)
Sodium: 141 mEq/L (ref 135–145)

## 2012-04-03 LAB — CBC WITH DIFFERENTIAL/PLATELET
Basophils Absolute: 0 10*3/uL (ref 0.0–0.1)
Basophils Relative: 0.5 % (ref 0.0–3.0)
Eosinophils Absolute: 0.2 10*3/uL (ref 0.0–0.7)
Eosinophils Relative: 1.8 % (ref 0.0–5.0)
HCT: 44.8 % (ref 36.0–46.0)
Hemoglobin: 14.9 g/dL (ref 12.0–15.0)
Lymphocytes Relative: 36.3 % (ref 12.0–46.0)
Lymphs Abs: 3.3 10*3/uL (ref 0.7–4.0)
MCHC: 33.3 g/dL (ref 30.0–36.0)
MCV: 97.6 fl (ref 78.0–100.0)
Monocytes Absolute: 0.5 10*3/uL (ref 0.1–1.0)
Monocytes Relative: 5.8 % (ref 3.0–12.0)
Neutro Abs: 5 10*3/uL (ref 1.4–7.7)
Neutrophils Relative %: 55.6 % (ref 43.0–77.0)
Platelets: 234 10*3/uL (ref 150.0–400.0)
RBC: 4.58 Mil/uL (ref 3.87–5.11)
RDW: 13.6 % (ref 11.5–14.6)
WBC: 9 10*3/uL (ref 4.5–10.5)

## 2012-04-03 LAB — LDL CHOLESTEROL, DIRECT: Direct LDL: 153.5 mg/dL

## 2012-04-03 LAB — HEPATIC FUNCTION PANEL
ALT: 9 U/L (ref 0–35)
AST: 17 U/L (ref 0–37)
Albumin: 3.8 g/dL (ref 3.5–5.2)
Alkaline Phosphatase: 95 U/L (ref 39–117)
Bilirubin, Direct: 0 mg/dL (ref 0.0–0.3)
Total Bilirubin: 0.3 mg/dL (ref 0.3–1.2)
Total Protein: 7.2 g/dL (ref 6.0–8.3)

## 2012-04-03 LAB — TSH: TSH: 3.4 u[IU]/mL (ref 0.35–5.50)

## 2012-04-03 LAB — LIPID PANEL
Cholesterol: 239 mg/dL — ABNORMAL HIGH (ref 0–200)
HDL: 56.8 mg/dL (ref >39.00)
Total CHOL/HDL Ratio: 4
Triglycerides: 127 mg/dL (ref 0.0–149.0)
VLDL: 25.4 mg/dL (ref 0.0–40.0)

## 2012-04-07 ENCOUNTER — Telehealth: Payer: Self-pay | Admitting: Family Medicine

## 2012-04-07 NOTE — Progress Notes (Signed)
Quick Note:  I left voice message with results. ______ 

## 2012-04-07 NOTE — Telephone Encounter (Signed)
Refill request for Zolpedem Tartrate 10 mg take 1 po qhs and last here on 05/16/11.

## 2012-04-08 MED ORDER — ZOLPIDEM TARTRATE 10 MG PO TABS: 10.0000 mg | ORAL_TABLET | Freq: Every evening | ORAL | Status: DC | PRN

## 2012-04-08 NOTE — Telephone Encounter (Signed)
Call in #30 with 5 rf 

## 2012-04-08 NOTE — Telephone Encounter (Signed)
I called in script 

## 2012-04-10 ENCOUNTER — Encounter: Payer: Self-pay | Admitting: Family Medicine

## 2012-04-10 ENCOUNTER — Ambulatory Visit (INDEPENDENT_AMBULATORY_CARE_PROVIDER_SITE_OTHER): Payer: 59 | Admitting: Family Medicine

## 2012-04-10 VITALS — BP 106/64 | HR 89 | Temp 98.9°F | Ht 65.5 in | Wt 113.0 lb

## 2012-04-10 DIAGNOSIS — Z0000 Encounter for general adult medical examination without abnormal findings: Secondary | ICD-10-CM

## 2012-04-10 MED ORDER — VARENICLINE TARTRATE 0.5 MG X 11 & 1 MG X 42 PO MISC: ORAL | Status: AC

## 2012-04-10 MED ORDER — DICYCLOMINE HCL 20 MG PO TABS: 20.0000 mg | ORAL_TABLET | Freq: Three times a day (TID) | ORAL | Status: DC | PRN

## 2012-04-10 MED ORDER — CYCLOBENZAPRINE HCL 10 MG PO TABS: 10.0000 mg | ORAL_TABLET | Freq: Three times a day (TID) | ORAL | Status: DC | PRN

## 2012-04-10 MED ORDER — ROSUVASTATIN CALCIUM 10 MG PO TABS: 10.0000 mg | ORAL_TABLET | Freq: Every day | ORAL | Status: DC

## 2012-04-10 MED ORDER — VARENICLINE TARTRATE 1 MG PO TABS: 1.0000 mg | ORAL_TABLET | Freq: Two times a day (BID) | ORAL | Status: AC

## 2012-04-10 NOTE — Progress Notes (Signed)
  Subjective:    Patient ID: Emily Ward, female    DOB: 1956/01/02, 56 y.o.   MRN: [id number]  HPI 56 yr old female for a cpx. She has several issues to discuss. First she asks if she can use Chantix to quit smoking. Second she asks about her IBS. Often she cannot go out to eat at a restaurant without having to run to the bathroom with diarrhea. She had used hyoscyamine in the past. Also we discussed how her LDL has gone up, and she admits to taking her Crestor very sporadically. Her last colonoscopy was well over 10 yrs ago.    Review of Systems  Constitutional: Negative.   HENT: Negative.   Eyes: Negative.   Respiratory: Negative.   Cardiovascular: Negative.   Gastrointestinal: Negative.   Genitourinary: Negative for dysuria, urgency, frequency, hematuria, flank pain, decreased urine volume, enuresis, difficulty urinating, pelvic pain and dyspareunia.  Musculoskeletal: Negative.   Skin: Negative.   Neurological: Negative.   Hematological: Negative.   Psychiatric/Behavioral: Negative.        Objective:   Physical Exam  Constitutional: She is oriented to person, place, and time. She appears well-developed and well-nourished. No distress.  HENT:  Head: Normocephalic and atraumatic.  Right Ear: External ear normal.  Left Ear: External ear normal.  Nose: Nose normal.  Mouth/Throat: Oropharynx is clear and moist. No oropharyngeal exudate.  Eyes: Conjunctivae and EOM are normal. Pupils are equal, round, and reactive to light. No scleral icterus.  Neck: Normal range of motion. Neck supple. No JVD present. No thyromegaly present.  Cardiovascular: Normal rate, regular rhythm, normal heart sounds and intact distal pulses.  Exam reveals no gallop and no friction rub.   No murmur heard.      EKG normal   Pulmonary/Chest: Effort normal and breath sounds normal. No respiratory distress. She has no wheezes. She has no rales. She exhibits no tenderness.  Abdominal: Soft. Bowel sounds are  normal. She exhibits no distension and no mass. There is no tenderness. There is no rebound and no guarding.  Musculoskeletal: Normal range of motion. She exhibits no edema and no tenderness.  Lymphadenopathy:    She has no cervical adenopathy.  Neurological: She is alert and oriented to person, place, and time. She has normal reflexes. No cranial nerve deficit. She exhibits normal muscle tone. Coordination normal.  Skin: Skin is warm and dry. No rash noted. No erythema.  Psychiatric: She has a normal mood and affect. Her behavior is normal. Judgment and thought content normal.          Assessment & Plan:  Well exam. Encouraged her to take her Crestor every single day. We will recheck lipids in 6 months. Set up a colonoscopy. Try Bentyl for the IBS, especially when she anticipates eating a meal out. Try  Chantix.

## 2012-04-18 ENCOUNTER — Encounter: Payer: Self-pay | Admitting: Internal Medicine

## 2012-05-23 ENCOUNTER — Ambulatory Visit (AMBULATORY_SURGERY_CENTER): Payer: 59

## 2012-05-23 VITALS — Ht 65.5 in | Wt 113.2 lb

## 2012-05-23 DIAGNOSIS — Z1211 Encounter for screening for malignant neoplasm of colon: Secondary | ICD-10-CM

## 2012-05-23 MED ORDER — MOVIPREP 100 G PO SOLR: 1.0000 | Freq: Once | ORAL | Status: DC

## 2012-06-05 ENCOUNTER — Telehealth: Payer: Self-pay | Admitting: Internal Medicine

## 2012-06-05 NOTE — Telephone Encounter (Signed)
Patient states that she already has IBS symptoms, and she is thinking about taking bentyl.  She also wanted to know if she still needed to take the whole prep even though she is having diarrhea with the IBS. I explained to her that if she took the bentyl, it would slow down her system.   I also told her that she needed to take the whole prep still.   She is not a diabetic, and she does understand that she still has a good amt of stool in her although she has had diarrhea for the past few hours.  The patient does understand the concepts, and will take the prep.  I told her to be sure to drink a lot of fluids, and to put a heating pad to her abd during the IBS attacks.  She agreed, and stated that she "would be fine."  I did tell her to call us if the pain got really bad.  She agreed.

## 2012-06-06 ENCOUNTER — Encounter: Payer: Self-pay | Admitting: Internal Medicine

## 2012-06-06 ENCOUNTER — Ambulatory Visit (AMBULATORY_SURGERY_CENTER): Payer: 59 | Admitting: Internal Medicine

## 2012-06-06 VITALS — BP 127/100 | HR 71 | Temp 97.7°F | Resp 21

## 2012-06-06 DIAGNOSIS — Z1211 Encounter for screening for malignant neoplasm of colon: Secondary | ICD-10-CM

## 2012-06-06 DIAGNOSIS — D128 Benign neoplasm of rectum: Secondary | ICD-10-CM

## 2012-06-06 DIAGNOSIS — D126 Benign neoplasm of colon, unspecified: Secondary | ICD-10-CM

## 2012-06-06 DIAGNOSIS — D129 Benign neoplasm of anus and anal canal: Secondary | ICD-10-CM

## 2012-06-06 HISTORY — PX: COLONOSCOPY: SHX174

## 2012-06-06 MED ORDER — SODIUM CHLORIDE 0.9 % IV SOLN: 500.0000 mL | INTRAVENOUS | Status: DC

## 2012-06-06 NOTE — Patient Instructions (Addendum)

## 2012-06-06 NOTE — Progress Notes (Signed)
Patient did not experience any of the following events: a burn prior to discharge; a fall within the facility; wrong site/side/patient/procedure/implant event; or a hospital transfer or hospital admission upon discharge from the facility. (G8907) Patient did not have preoperative order for IV antibiotic SSI prophylaxis. (G8918)  

## 2012-06-06 NOTE — Progress Notes (Signed)
Pressure applied to the abdomen to reach the cecum 

## 2012-06-06 NOTE — Op Note (Signed)
[facility] [address] [place], [address]   COLONOSCOPY PROCEDURE REPORT  PATIENT: Grover, Woodfield  MR#: [id number] BIRTHDATE: 10/19/1955 , 55  yrs. old GENDER: Female ENDOSCOPIST: Hart Carwin, MD REFERRED BY:  Nelwyn Salisbury, M.D. PROCEDURE DATE:  06/06/2012 PROCEDURE:   Colonoscopy with cold biopsy polypectomy and Colonoscopy with snare polypectomy ASA CLASS:   Class I INDICATIONS:average risk patient for colon cancer and prior colonoscopy > 10 years ago, IBS. MEDICATIONS: MAC sedation, administered by CRNA and Propofol (Diprivan) 300 mg IV  DESCRIPTION OF PROCEDURE:   After the risks and benefits and of the procedure were explained, informed consent was obtained.  A digital rectal exam revealed no abnormalities of the rectum.    The LB [id number]-[id number]  endoscope was introduced through the anus and advanced to the cecum, which was identified by both the appendix and ileocecal valve .  The quality of the prep was excellent, using MoviPrep .  The instrument was then slowly withdrawn as the colon was fully examined.     COLON FINDINGS: Six smooth flat polyps ranging between 3-19mm in size were found in the rectum and sigmoid colon.  A polypectomy was performed with cold forceps and with a cold snare.  The resection was complete and the polyp tissue was completely retrieved. Random biopsies of the colon taken to r/o microscopic colitis Retroflexed views revealed no abnormalities.     The scope was then withdrawn from the patient and the procedure completed.  COMPLICATIONS: There were no complications. ENDOSCOPIC IMPRESSION: Six flat polyps ranging between 3-4mm in size were found in the rectum and sigmoid colon; polypectomy was performed with cold forceps and with a cold snare s/p random biopsies of the colon to r/o microscopic colitis  RECOMMENDATIONS: 1.  await pathology results 2.  High fiber diet   REPEAT EXAM: In 5 year(s)  for  Colonoscopy.  cc:  _______________________________ eSignedHart Carwin, MD 06/06/2012 12:43 PM     PATIENT NAME:  Alexiz, Cothran MR#: [id number]

## 2012-06-09 ENCOUNTER — Telehealth: Payer: Self-pay | Admitting: *Deleted

## 2012-06-09 NOTE — Telephone Encounter (Signed)
No answer, left message to call if questions or concerns. 

## 2012-06-10 ENCOUNTER — Encounter: Payer: Self-pay | Admitting: Internal Medicine

## 2012-06-25 ENCOUNTER — Telehealth: Payer: Self-pay | Admitting: Family Medicine

## 2012-06-25 NOTE — Telephone Encounter (Signed)
Refill request for Butalbital/Acetaminophen/Caffeine Tab 500/50/40 mg is no longer made. Can pt switch to 325/50/40 mg ?

## 2012-06-25 NOTE — Telephone Encounter (Signed)
Yes okay to switch to the new dosage. Call in #120 with 5 rf

## 2012-06-26 MED ORDER — BUTALBITAL-APAP-CAFFEINE 50-325-40 MG PO TABS: 1.0000 | ORAL_TABLET | Freq: Four times a day (QID) | ORAL | Status: DC | PRN

## 2012-06-26 NOTE — Telephone Encounter (Signed)
I called in script and spoke with pt. 

## 2012-07-23 ENCOUNTER — Other Ambulatory Visit: Payer: Self-pay | Admitting: Family Medicine

## 2012-07-23 MED ORDER — HYDROCODONE-ACETAMINOPHEN 10-325 MG PO TABS: 1.0000 | ORAL_TABLET | Freq: Four times a day (QID) | ORAL | Status: AC | PRN

## 2012-07-23 NOTE — Telephone Encounter (Signed)
I called in script 

## 2012-07-23 NOTE — Telephone Encounter (Signed)
Call in #120 with 5 rf 

## 2012-07-23 NOTE — Telephone Encounter (Signed)
Refill request for Norco 10-325 mg take 1 po q6hrs prn and last here on 04/10/12.

## 2012-09-15 ENCOUNTER — Ambulatory Visit (INDEPENDENT_AMBULATORY_CARE_PROVIDER_SITE_OTHER): Payer: 59 | Admitting: Family Medicine

## 2012-09-15 ENCOUNTER — Encounter: Payer: Self-pay | Admitting: Family Medicine

## 2012-09-15 VITALS — BP 116/62 | HR 98 | Temp 98.4°F | Wt 108.0 lb

## 2012-09-15 DIAGNOSIS — J329 Chronic sinusitis, unspecified: Secondary | ICD-10-CM

## 2012-09-15 MED ORDER — LEVOFLOXACIN 500 MG PO TABS: 500.0000 mg | ORAL_TABLET | Freq: Every day | ORAL | Status: DC

## 2012-09-15 MED ORDER — PANTOPRAZOLE SODIUM 40 MG PO TBEC: 40.0000 mg | DELAYED_RELEASE_TABLET | Freq: Every day | ORAL | Status: DC

## 2012-09-15 MED ORDER — HYDROCODONE-HOMATROPINE 5-1.5 MG/5ML PO SYRP: 5.0000 mL | ORAL_SOLUTION | ORAL | Status: DC | PRN

## 2012-09-15 MED ORDER — ROSUVASTATIN CALCIUM 10 MG PO TABS: 10.0000 mg | ORAL_TABLET | Freq: Every day | ORAL | Status: DC

## 2012-09-15 NOTE — Progress Notes (Signed)
  Subjective:    Patient ID: Emily Ward, female    DOB: 1956-04-17, 57 y.o.   MRN: [id number]  HPI Here for 2 weeks of feeling ill. This started with a few days of fever to 102 degrees, HA, body aches, and a dry cough. This got better but then she developed sinus pressure, blowing green out of the nose, and more coughing. No NVD. Drinking fluids and taking Mucinex.    Review of Systems  Constitutional: Negative.   HENT: Positive for congestion, postnasal drip and sinus pressure.   Eyes: Negative.   Respiratory: Positive for cough.        Objective:   Physical Exam  Constitutional: She appears well-developed and well-nourished.  HENT:  Right Ear: External ear normal.  Left Ear: External ear normal.  Nose: Nose normal.  Mouth/Throat: Oropharynx is clear and moist.  Eyes: Conjunctivae normal are normal.  Pulmonary/Chest: Effort normal and breath sounds normal.  Lymphadenopathy:    She has no cervical adenopathy.          Assessment & Plan:  This is consistent with sinusitis secondary to influenza.

## 2012-09-30 ENCOUNTER — Telehealth: Payer: Self-pay | Admitting: Family Medicine

## 2012-09-30 NOTE — Telephone Encounter (Signed)
Refill request for Zolpidem 10 mg.

## 2012-10-01 MED ORDER — ZOLPIDEM TARTRATE 10 MG PO TABS: 10.0000 mg | ORAL_TABLET | Freq: Every evening | ORAL | Status: DC | PRN

## 2012-10-01 NOTE — Telephone Encounter (Signed)
Call in #30 with 5 rf 

## 2012-10-01 NOTE — Telephone Encounter (Signed)
Rx called in to pharmacy. 

## 2012-10-11 ENCOUNTER — Other Ambulatory Visit: Payer: Self-pay | Admitting: Family Medicine

## 2012-12-12 ENCOUNTER — Other Ambulatory Visit: Payer: Self-pay | Admitting: Family Medicine

## 2012-12-17 ENCOUNTER — Other Ambulatory Visit: Payer: Self-pay | Admitting: Family Medicine

## 2012-12-18 NOTE — Telephone Encounter (Signed)
Pt last seen 1.13.14. Rx last filled 10.24.13 #120x 5 rf.

## 2012-12-22 NOTE — Telephone Encounter (Signed)
Call in #120 with 5 rf 

## 2013-02-11 ENCOUNTER — Ambulatory Visit (INDEPENDENT_AMBULATORY_CARE_PROVIDER_SITE_OTHER): Payer: 59 | Admitting: Family Medicine

## 2013-02-11 ENCOUNTER — Encounter: Payer: Self-pay | Admitting: Family Medicine

## 2013-02-11 VITALS — BP 110/60 | Temp 98.9°F

## 2013-02-11 DIAGNOSIS — J029 Acute pharyngitis, unspecified: Secondary | ICD-10-CM

## 2013-02-11 LAB — POCT RAPID STREP A (OFFICE): Rapid Strep A Screen: NEGATIVE

## 2013-02-11 NOTE — Progress Notes (Signed)
  Subjective:    Patient ID: Emily Ward, female    DOB: 1956-05-21, 57 y.o.   MRN: [id number]  HPI Acute visit Patient has sore throat. Onset yesterday. She also noticed some slight headache and stomachache. Denies any nausea, vomiting, or diarrhea. Today she developed slight dry cough. Minimal if any postnasal drainage. Fever, headache, and sore throat improved with Tylenol. She works as a Tax adviser. Exposed to strep about one week ago. She has not noted any lymphadenopathy. No skin rashes.  Past Medical History  Diagnosis Date  . Peptic ulcer   . Heart murmur   . Hyperlipidemia   . MVP (mitral valve prolapse)   . Hepatitis   . Migraines   . Cervical disc disease   . Fibromyalgia     sees Dr. Corliss Skains    Past Surgical History  Procedure Laterality Date  . Cesarean section    . Cholecystectomy    . Abdominal hysterectomy    . Cervical laminectomy      reports that she has been smoking Cigarettes.  She has a 10 pack-year smoking history. She has never used smokeless tobacco. She reports that  drinks alcohol. She reports that she does not use illicit drugs. family history includes Cancer in an unspecified family member and Heart disease in an unspecified family member.  There is no history of Colon cancer, and Esophageal cancer, and Rectal cancer, and Stomach cancer, . Allergies  Allergen Reactions  . Divalproex Sodium     Drug induced hepatitis  . Nsaids     Upset stomach      Review of Systems  Constitutional: Positive for fever. Negative for chills.  HENT: Positive for sore throat. Negative for neck pain and neck stiffness.   Respiratory: Positive for cough. Negative for shortness of breath and wheezing.   Gastrointestinal: Negative for nausea and vomiting.  Neurological: Positive for headaches.       Objective:   Physical Exam  Constitutional: She appears well-developed and well-nourished.  HENT:  Right Ear: External ear normal.  Left Ear:  External ear normal.  Oropharynx minimal erythema. Couple small aphthous ulcers. No exudate  Neck: Neck supple.  No lymphadenopathy  Cardiovascular: Normal rate and regular rhythm.   Pulmonary/Chest: Effort normal and breath sounds normal. No respiratory distress. She has no wheezes. She has no rales.  Skin: No rash noted.          Assessment & Plan:  Pharyngitis. Rapid strep negative. Suspect viral. Salt water gargles. Continue Tylenol for symptom relief.

## 2013-02-11 NOTE — Patient Instructions (Addendum)
Viral Pharyngitis Viral pharyngitis is a viral infection that produces redness, pain, and swelling (inflammation) of the throat. It can spread from person to person (contagious). CAUSES Viral pharyngitis is caused by inhaling a large amount of certain germs called viruses. Many different viruses cause viral pharyngitis. SYMPTOMS Symptoms of viral pharyngitis include:  Sore throat.  Tiredness.  Stuffy nose.  Low-grade fever.  Congestion.  Cough. TREATMENT Treatment includes rest, drinking plenty of fluids, and the use of over-the-counter medication (approved by your caregiver). HOME CARE INSTRUCTIONS   Drink enough fluids to keep your urine clear or pale yellow.  Eat soft, cold foods such as ice cream, frozen ice pops, or gelatin dessert.  Gargle with warm salt water (1 tsp salt per 1 qt of water).  If over age 7, throat lozenges may be used safely.  Only take over-the-counter or prescription medicines for pain, discomfort, or fever as directed by your caregiver. Do not take aspirin. To help prevent spreading viral pharyngitis to others, avoid:  Mouth-to-mouth contact with others.  Sharing utensils for eating and drinking.  Coughing around others. SEEK MEDICAL CARE IF:   You are better in a few days, then become worse.  You have a fever or pain not helped by pain medicines.  There are any other changes that concern you. Document Released: 05/30/2005 Document Revised: 11/12/2011 Document Reviewed: 10/26/2010 ExitCare Patient Information 2014 [employer].  

## 2013-02-13 ENCOUNTER — Ambulatory Visit (INDEPENDENT_AMBULATORY_CARE_PROVIDER_SITE_OTHER): Payer: 59 | Admitting: Family Medicine

## 2013-02-13 ENCOUNTER — Telehealth: Payer: Self-pay | Admitting: Family Medicine

## 2013-02-13 ENCOUNTER — Encounter: Payer: Self-pay | Admitting: Family Medicine

## 2013-02-13 VITALS — BP 102/66 | Temp 99.1°F | Wt 107.0 lb

## 2013-02-13 DIAGNOSIS — J019 Acute sinusitis, unspecified: Secondary | ICD-10-CM

## 2013-02-13 MED ORDER — LEVOFLOXACIN 500 MG PO TABS: 500.0000 mg | ORAL_TABLET | Freq: Every day | ORAL | Status: AC

## 2013-02-13 MED ORDER — METHYLPREDNISOLONE ACETATE 80 MG/ML IJ SUSP
120.0000 mg | Freq: Once | INTRAMUSCULAR | Status: AC
  Administered 2013-02-13: 120 mg via INTRAMUSCULAR

## 2013-02-13 MED ORDER — HYDROCODONE-HOMATROPINE 5-1.5 MG/5ML PO SYRP: 5.0000 mL | ORAL_SOLUTION | ORAL | Status: AC | PRN

## 2013-02-13 NOTE — Telephone Encounter (Signed)
Patient Information:  Caller Name: Cythnia  Phone: [phone]  Patient: Emily Ward  Gender: Female  DOB: 1956-02-25  Age: 57 Years  PCP: Gershon Crane [facility])  Pregnant: No  Office Follow Up:  Does the office need to follow up with this patient?: No  Instructions For The Office: N/A  RN Note:  Seen in office for sore throat, fever 02/11/13.  Strep negative.  Onset of sinus pain, continued fever 100.5, and cough 02/12/13.  States has tenderness to cheekbones.  Per sinus pain protocol, advised appt today; appt scheduled 1330 02/13/13 with Dr. Clent Ridges.  krs/can  Symptoms  Reason For Call & Symptoms: cough, sinus pain  Reviewed Health History In EMR: Yes  Reviewed Medications In EMR: Yes  Reviewed Allergies In EMR: Yes  Reviewed Surgeries / Procedures: Yes  Date of Onset of Symptoms: 02/09/2013  Any Fever: Yes  Fever Taken: Oral  Fever Time Of Reading: 21:00:00  Fever Last Reading: 100.5 OB / GYN:  LMP: Unknown  Guideline(s) Used:  Sinus Pain and Congestion  Disposition Per Guideline:   See Today in Office  Reason For Disposition Reached:   Fever present > 3 days (72 hours)  Advice Given:  N/A  Patient Will Follow Care Advice:  YES  Appointment Scheduled:  02/13/2013 13:30:00 Appointment Scheduled Provider:  Gershon Crane [facility])

## 2013-02-13 NOTE — Addendum Note (Signed)
Addended by: Aniceto Boss A on: 02/13/2013 02:41 PM   Modules accepted: Orders

## 2013-02-13 NOTE — Progress Notes (Signed)
  Subjective:    Patient ID: Emily Ward, female    DOB: [birth year], 57 y.o.   MRN: [id number]  HPI Here for one week of stuffy head, PND, fever, ST, and coughing up green sputum.    Review of Systems  Constitutional: Positive for fever.  HENT: Positive for congestion, postnasal drip and sinus pressure.   Eyes: Negative.   Respiratory: Positive for cough.        Objective:   Physical Exam  Constitutional: She appears well-developed and well-nourished.  HENT:  Right Ear: External ear normal.  Left Ear: External ear normal.  Nose: Nose normal.  Mouth/Throat: Oropharynx is clear and moist.  Eyes: Conjunctivae are normal.  Neck: Neck supple. No thyromegaly present.  Pulmonary/Chest: Effort normal. No respiratory distress. She has no wheezes. She has no rales.  Scattered rhonchi   Lymphadenopathy:    She has no cervical adenopathy.          Assessment & Plan:  Add Mucinex.

## 2013-03-08 ENCOUNTER — Other Ambulatory Visit: Payer: Self-pay | Admitting: Family Medicine

## 2013-03-26 ENCOUNTER — Other Ambulatory Visit: Payer: Self-pay | Admitting: Family Medicine

## 2013-03-26 NOTE — Telephone Encounter (Signed)
Okay for 6 months 

## 2013-04-03 ENCOUNTER — Other Ambulatory Visit (INDEPENDENT_AMBULATORY_CARE_PROVIDER_SITE_OTHER): Payer: 59

## 2013-04-03 DIAGNOSIS — Z0000 Encounter for general adult medical examination without abnormal findings: Secondary | ICD-10-CM

## 2013-04-03 LAB — CBC WITH DIFFERENTIAL/PLATELET
Basophils Absolute: 0 10*3/uL (ref 0.0–0.1)
Basophils Relative: 0.5 % (ref 0.0–3.0)
Eosinophils Absolute: 0.1 10*3/uL (ref 0.0–0.7)
Eosinophils Relative: 1.3 % (ref 0.0–5.0)
HCT: 44.2 % (ref 36.0–46.0)
Hemoglobin: 14.5 g/dL (ref 12.0–15.0)
Lymphocytes Relative: 35.2 % (ref 12.0–46.0)
Lymphs Abs: 3.4 10*3/uL (ref 0.7–4.0)
MCHC: 32.8 g/dL (ref 30.0–36.0)
MCV: 95.7 fl (ref 78.0–100.0)
Monocytes Absolute: 0.5 10*3/uL (ref 0.1–1.0)
Monocytes Relative: 5.5 % (ref 3.0–12.0)
Neutro Abs: 5.6 10*3/uL (ref 1.4–7.7)
Neutrophils Relative %: 57.5 % (ref 43.0–77.0)
Platelets: 236 10*3/uL (ref 150.0–400.0)
RBC: 4.61 Mil/uL (ref 3.87–5.11)
RDW: 13.8 % (ref 11.5–14.6)
WBC: 9.7 10*3/uL (ref 4.5–10.5)

## 2013-04-03 LAB — LIPID PANEL
Cholesterol: 180 mg/dL (ref 0–200)
HDL: 66.8 mg/dL (ref >39.00)
LDL Cholesterol: 96 mg/dL (ref 0–99)
Total CHOL/HDL Ratio: 3
Triglycerides: 87 mg/dL (ref 0.0–149.0)
VLDL: 17.4 mg/dL (ref 0.0–40.0)

## 2013-04-03 LAB — POCT URINALYSIS DIPSTICK
Bilirubin, UA: NEGATIVE
Glucose, UA: NEGATIVE
Ketones, UA: NEGATIVE
Leukocytes, UA: NEGATIVE
Nitrite, UA: NEGATIVE
Protein, UA: NEGATIVE
Spec Grav, UA: 1.015
Urobilinogen, UA: 0.2
pH, UA: 6

## 2013-04-03 LAB — HEPATIC FUNCTION PANEL
ALT: 6 U/L (ref 0–35)
AST: 16 U/L (ref 0–37)
Albumin: 3.8 g/dL (ref 3.5–5.2)
Alkaline Phosphatase: 93 U/L (ref 39–117)
Bilirubin, Direct: 0 mg/dL (ref 0.0–0.3)
Total Bilirubin: 0.5 mg/dL (ref 0.3–1.2)
Total Protein: 7.2 g/dL (ref 6.0–8.3)

## 2013-04-03 LAB — BASIC METABOLIC PANEL
BUN: 14 mg/dL (ref 6–23)
CO2: 27 mEq/L (ref 19–32)
Calcium: 9.8 mg/dL (ref 8.4–10.5)
Chloride: 106 mEq/L (ref 96–112)
Creatinine, Ser: 0.5 mg/dL (ref 0.4–1.2)
GFR: 130.31 mL/min (ref >60.00)
Glucose, Bld: 94 mg/dL (ref 70–99)
Potassium: 4.1 mEq/L (ref 3.5–5.1)
Sodium: 141 mEq/L (ref 135–145)

## 2013-04-03 LAB — TSH: TSH: 2.62 u[IU]/mL (ref 0.35–5.50)

## 2013-04-03 NOTE — Progress Notes (Signed)
Quick Note:  Pt has appointment on 04/10/13 will go over then. ______

## 2013-04-10 ENCOUNTER — Ambulatory Visit (INDEPENDENT_AMBULATORY_CARE_PROVIDER_SITE_OTHER): Payer: 59 | Admitting: Family Medicine

## 2013-04-10 ENCOUNTER — Encounter: Payer: Self-pay | Admitting: Family Medicine

## 2013-04-10 VITALS — BP 118/68 | HR 77 | Temp 98.3°F | Ht 65.25 in | Wt 106.0 lb

## 2013-04-10 DIAGNOSIS — Z0000 Encounter for general adult medical examination without abnormal findings: Secondary | ICD-10-CM

## 2013-04-10 MED ORDER — CYCLOBENZAPRINE HCL 10 MG PO TABS: 10.0000 mg | ORAL_TABLET | Freq: Three times a day (TID) | ORAL | Status: DC | PRN

## 2013-04-10 MED ORDER — CONJ ESTROG-MEDROXYPROGEST ACE 0.625-2.5 MG PO TABS: 1.0000 | ORAL_TABLET | Freq: Every day | ORAL | Status: DC

## 2013-04-10 MED ORDER — VARENICLINE TARTRATE 0.5 MG PO TABS: 0.5000 mg | ORAL_TABLET | Freq: Two times a day (BID) | ORAL | Status: DC

## 2013-04-10 NOTE — Progress Notes (Signed)
  Subjective:    Patient ID: Emily Ward, female    DOB: 1956/06/28, 57 y.o.   MRN: [id number]  HPI 57 yr old female for a cpx. She feels well. She tried Chantix but had to stop it when she titrated the dose up to 1 mg because she became very nauseated. She asks if she can stay on the starting dose.    Review of Systems  Constitutional: Negative.   HENT: Negative.   Eyes: Negative.   Respiratory: Negative.   Cardiovascular: Negative.   Gastrointestinal: Negative.   Genitourinary: Negative for dysuria, urgency, frequency, hematuria, flank pain, decreased urine volume, enuresis, difficulty urinating, pelvic pain and dyspareunia.  Musculoskeletal: Negative.   Skin: Negative.   Neurological: Negative.   Psychiatric/Behavioral: Negative.        Objective:   Physical Exam  Constitutional: She is oriented to person, place, and time. She appears well-developed and well-nourished. No distress.  HENT:  Head: Normocephalic and atraumatic.  Right Ear: External ear normal.  Left Ear: External ear normal.  Nose: Nose normal.  Mouth/Throat: Oropharynx is clear and moist. No oropharyngeal exudate.  Eyes: Conjunctivae and EOM are normal. Pupils are equal, round, and reactive to light. No scleral icterus.  Neck: Normal range of motion. Neck supple. No JVD present. No thyromegaly present.  Cardiovascular: Normal rate, regular rhythm, normal heart sounds and intact distal pulses.  Exam reveals no gallop and no friction rub.   No murmur heard. EKG normal   Pulmonary/Chest: Effort normal and breath sounds normal. No respiratory distress. She has no wheezes. She has no rales. She exhibits no tenderness.  Abdominal: Soft. Bowel sounds are normal. She exhibits no distension and no mass. There is no tenderness. There is no rebound and no guarding.  Musculoskeletal: Normal range of motion. She exhibits no edema and no tenderness.  Lymphadenopathy:    She has no cervical adenopathy.  Neurological:  She is alert and oriented to person, place, and time. She has normal reflexes. No cranial nerve deficit. She exhibits normal muscle tone. Coordination normal.  Skin: Skin is warm and dry. No rash noted. No erythema.  Psychiatric: She has a normal mood and affect. Her behavior is normal. Judgment and thought content normal.          Assessment & Plan:  Well exam. Try Chantix by staying at the 0.5 mg dose.

## 2013-04-26 ENCOUNTER — Other Ambulatory Visit: Payer: Self-pay | Admitting: Family Medicine

## 2013-05-02 ENCOUNTER — Other Ambulatory Visit: Payer: Self-pay | Admitting: Family Medicine

## 2013-06-20 ENCOUNTER — Other Ambulatory Visit: Payer: Self-pay | Admitting: Family Medicine

## 2013-06-22 NOTE — Telephone Encounter (Signed)
Call in #120 with 5 rf 

## 2013-06-23 ENCOUNTER — Telehealth: Payer: Self-pay | Admitting: Family Medicine

## 2013-06-23 NOTE — Telephone Encounter (Signed)
Opened in error/kh 

## 2013-06-30 ENCOUNTER — Other Ambulatory Visit: Payer: Self-pay | Admitting: Family Medicine

## 2013-07-09 ENCOUNTER — Other Ambulatory Visit: Payer: Self-pay

## 2013-07-14 ENCOUNTER — Other Ambulatory Visit: Payer: Self-pay | Admitting: Family Medicine

## 2013-09-15 ENCOUNTER — Other Ambulatory Visit: Payer: Self-pay | Admitting: Family Medicine

## 2013-09-17 NOTE — Telephone Encounter (Signed)
Call in #30 with 5 rf 

## 2013-09-24 ENCOUNTER — Other Ambulatory Visit: Payer: Self-pay | Admitting: Family Medicine

## 2013-10-13 ENCOUNTER — Other Ambulatory Visit: Payer: Self-pay | Admitting: Family Medicine

## 2013-12-06 ENCOUNTER — Other Ambulatory Visit: Payer: Self-pay | Admitting: Family Medicine

## 2013-12-07 ENCOUNTER — Other Ambulatory Visit: Payer: Self-pay | Admitting: Family Medicine

## 2013-12-19 ENCOUNTER — Other Ambulatory Visit: Payer: Self-pay | Admitting: Family Medicine

## 2014-01-04 ENCOUNTER — Other Ambulatory Visit: Payer: Self-pay | Admitting: Family

## 2014-01-04 NOTE — Telephone Encounter (Signed)
Call in #120 with 5 rf 

## 2014-03-14 ENCOUNTER — Other Ambulatory Visit: Payer: Self-pay | Admitting: Family Medicine

## 2014-03-16 NOTE — Telephone Encounter (Signed)
Call in Zolpidem #30 with 5 rf, and Parafon #120 with 5 rf

## 2014-04-06 ENCOUNTER — Telehealth: Payer: Self-pay | Admitting: Family Medicine

## 2014-04-06 NOTE — Telephone Encounter (Signed)
Yes, okay to schedule.

## 2014-04-06 NOTE — Telephone Encounter (Signed)
Pt need to reschedule appt on 8/14 due to dr. Sarajane Jews out of the office. Pt states she can only come on a Friday. Ok to use two slots/sda?

## 2014-04-07 ENCOUNTER — Telehealth: Payer: Self-pay | Admitting: Family Medicine

## 2014-04-07 NOTE — Telephone Encounter (Signed)
Okay to schedule

## 2014-04-07 NOTE — Telephone Encounter (Signed)
Pt has to reschedule due to dr. Sarajane Jews out of office on 8/14, pt needs a Friday appt Ok to use sda slots?

## 2014-04-08 NOTE — Telephone Encounter (Signed)
appt scheduled for pt.  

## 2014-04-09 ENCOUNTER — Other Ambulatory Visit (INDEPENDENT_AMBULATORY_CARE_PROVIDER_SITE_OTHER): Payer: 59

## 2014-04-09 DIAGNOSIS — Z0000 Encounter for general adult medical examination without abnormal findings: Secondary | ICD-10-CM

## 2014-04-09 LAB — HEPATIC FUNCTION PANEL
ALT: 8 U/L (ref 0–35)
AST: 15 U/L (ref 0–37)
Albumin: 3.2 g/dL — ABNORMAL LOW (ref 3.5–5.2)
Alkaline Phosphatase: 90 U/L (ref 39–117)
Bilirubin, Direct: 0 mg/dL (ref 0.0–0.3)
Total Bilirubin: 0.4 mg/dL (ref 0.2–1.2)
Total Protein: 6.7 g/dL (ref 6.0–8.3)

## 2014-04-09 LAB — BASIC METABOLIC PANEL
BUN: 17 mg/dL (ref 6–23)
CO2: 30 mEq/L (ref 19–32)
Calcium: 9.6 mg/dL (ref 8.4–10.5)
Chloride: 105 mEq/L (ref 96–112)
Creatinine, Ser: 0.6 mg/dL (ref 0.4–1.2)
GFR: 112.22 mL/min (ref >60.00)
Glucose, Bld: 91 mg/dL (ref 70–99)
Potassium: 5.1 mEq/L (ref 3.5–5.1)
Sodium: 140 mEq/L (ref 135–145)

## 2014-04-09 LAB — CBC WITH DIFFERENTIAL/PLATELET
Basophils Absolute: 0 10*3/uL (ref 0.0–0.1)
Basophils Relative: 0.4 % (ref 0.0–3.0)
Eosinophils Absolute: 0.2 10*3/uL (ref 0.0–0.7)
Eosinophils Relative: 2.4 % (ref 0.0–5.0)
HCT: 41.3 % (ref 36.0–46.0)
Hemoglobin: 13.8 g/dL (ref 12.0–15.0)
Lymphocytes Relative: 39.8 % (ref 12.0–46.0)
Lymphs Abs: 3.3 10*3/uL (ref 0.7–4.0)
MCHC: 33.4 g/dL (ref 30.0–36.0)
MCV: 95 fl (ref 78.0–100.0)
Monocytes Absolute: 0.5 10*3/uL (ref 0.1–1.0)
Monocytes Relative: 6.5 % (ref 3.0–12.0)
Neutro Abs: 4.2 10*3/uL (ref 1.4–7.7)
Neutrophils Relative %: 50.9 % (ref 43.0–77.0)
Platelets: 301 10*3/uL (ref 150.0–400.0)
RBC: 4.35 Mil/uL (ref 3.87–5.11)
RDW: 13.1 % (ref 11.5–15.5)
WBC: 8.2 10*3/uL (ref 4.0–10.5)

## 2014-04-09 LAB — POCT URINALYSIS DIPSTICK
Bilirubin, UA: NEGATIVE
Glucose, UA: NEGATIVE
Ketones, UA: NEGATIVE
Nitrite, UA: NEGATIVE
Protein, UA: NEGATIVE
Spec Grav, UA: 1.02
Urobilinogen, UA: 0.2
pH, UA: 5

## 2014-04-09 LAB — LIPID PANEL
Cholesterol: 182 mg/dL (ref 0–200)
HDL: 51.6 mg/dL (ref >39.00)
LDL Cholesterol: 104 mg/dL — ABNORMAL HIGH (ref 0–99)
NonHDL: 130.4
Total CHOL/HDL Ratio: 4
Triglycerides: 130 mg/dL (ref 0.0–149.0)
VLDL: 26 mg/dL (ref 0.0–40.0)

## 2014-04-09 LAB — TSH: TSH: 3.44 u[IU]/mL (ref 0.35–4.50)

## 2014-04-16 ENCOUNTER — Encounter: Payer: 59 | Admitting: Family Medicine

## 2014-04-23 ENCOUNTER — Encounter: Payer: Self-pay | Admitting: Family Medicine

## 2014-04-23 ENCOUNTER — Ambulatory Visit (INDEPENDENT_AMBULATORY_CARE_PROVIDER_SITE_OTHER): Payer: 59 | Admitting: Family Medicine

## 2014-04-23 VITALS — BP 110/70 | HR 78 | Temp 98.6°F | Ht 64.75 in | Wt 115.0 lb

## 2014-04-23 DIAGNOSIS — Z0000 Encounter for general adult medical examination without abnormal findings: Secondary | ICD-10-CM

## 2014-04-23 DIAGNOSIS — E785 Hyperlipidemia, unspecified: Secondary | ICD-10-CM

## 2014-04-23 MED ORDER — BUTALBITAL-APAP-CAFFEINE 50-325-40 MG PO TABS: ORAL_TABLET | ORAL | Status: DC

## 2014-04-23 MED ORDER — ROSUVASTATIN CALCIUM 10 MG PO TABS: 10.0000 mg | ORAL_TABLET | Freq: Every day | ORAL | Status: DC

## 2014-04-23 MED ORDER — ZOLMITRIPTAN 5 MG PO TABS: ORAL_TABLET | ORAL | Status: DC

## 2014-04-23 MED ORDER — PANTOPRAZOLE SODIUM 40 MG PO TBEC: DELAYED_RELEASE_TABLET | ORAL | Status: DC

## 2014-04-23 MED ORDER — CYCLOBENZAPRINE HCL 10 MG PO TABS: 10.0000 mg | ORAL_TABLET | Freq: Three times a day (TID) | ORAL | Status: DC | PRN

## 2014-04-23 MED ORDER — HYDROCODONE-ACETAMINOPHEN 10-325 MG PO TABS: 1.0000 | ORAL_TABLET | Freq: Four times a day (QID) | ORAL | Status: DC | PRN

## 2014-04-23 MED ORDER — LORAZEPAM 0.5 MG PO TABS: 0.5000 mg | ORAL_TABLET | Freq: Three times a day (TID) | ORAL | Status: DC | PRN

## 2014-04-23 MED ORDER — CONJ ESTROG-MEDROXYPROGEST ACE 0.625-2.5 MG PO TABS: ORAL_TABLET | ORAL | Status: DC

## 2014-04-23 MED ORDER — ZOLPIDEM TARTRATE 10 MG PO TABS: ORAL_TABLET | ORAL | Status: DC

## 2014-04-23 NOTE — Progress Notes (Signed)
Pre visit review using our clinic review tool, if applicable. No additional management support is needed unless otherwise documented below in the visit note. 

## 2014-04-23 NOTE — Progress Notes (Signed)
   Subjective:    Patient ID: Emily Ward, female    DOB: 25-May-1956, 58 y.o.   MRN: [id number]  HPI 58 yr old female for a cpx. She feels well but does deal with some anxiety at times. She is very proud to tell me she quit smoking 6 weeks ago.     Review of Systems  Constitutional: Negative.   HENT: Negative.   Eyes: Negative.   Respiratory: Negative.   Cardiovascular: Negative.   Gastrointestinal: Negative.   Genitourinary: Negative for dysuria, urgency, frequency, hematuria, flank pain, decreased urine volume, enuresis, difficulty urinating, pelvic pain and dyspareunia.  Musculoskeletal: Negative.   Skin: Negative.   Neurological: Negative.   Psychiatric/Behavioral: Negative.        Objective:   Physical Exam  Constitutional: She is oriented to person, place, and time. She appears well-developed and well-nourished. No distress.  HENT:  Head: Normocephalic and atraumatic.  Right Ear: External ear normal.  Left Ear: External ear normal.  Nose: Nose normal.  Mouth/Throat: Oropharynx is clear and moist. No oropharyngeal exudate.  Eyes: Conjunctivae and EOM are normal. Pupils are equal, round, and reactive to light. No scleral icterus.  Neck: Normal range of motion. Neck supple. No JVD present. No thyromegaly present.  Cardiovascular: Normal rate, regular rhythm, normal heart sounds and intact distal pulses.  Exam reveals no gallop and no friction rub.   No murmur heard. EKG normal   Pulmonary/Chest: Effort normal and breath sounds normal. No respiratory distress. She has no wheezes. She has no rales. She exhibits no tenderness.  Abdominal: Soft. Bowel sounds are normal. She exhibits no distension and no mass. There is no tenderness. There is no rebound and no guarding.  Musculoskeletal: Normal range of motion. She exhibits no edema and no tenderness.  Lymphadenopathy:    She has no cervical adenopathy.  Neurological: She is alert and oriented to person, place, and time.  She has normal reflexes. No cranial nerve deficit. She exhibits normal muscle tone. Coordination normal.  Skin: Skin is warm and dry. No rash noted. No erythema.  Psychiatric: She has a normal mood and affect. Her behavior is normal. Judgment and thought content normal.          Assessment & Plan:  Well exam. Try Ativan prn.

## 2014-04-26 ENCOUNTER — Other Ambulatory Visit: Payer: Self-pay | Admitting: Family Medicine

## 2014-05-12 ENCOUNTER — Telehealth: Payer: Self-pay | Admitting: Family Medicine

## 2014-05-12 DIAGNOSIS — N950 Postmenopausal bleeding: Secondary | ICD-10-CM

## 2014-05-12 NOTE — Telephone Encounter (Signed)
She can see me if she likes but any post-menopausal bleeding should be evaluated by a GYN. She needs a pelvic US as well as an exam. I will refer her to GYN.

## 2014-05-12 NOTE — Telephone Encounter (Signed)
Patient Information:  Caller Name: Lemya  Phone: [phone]  Patient: Emily Ward  Gender: Female  DOB: 07-May-1956  Age: 58 Years  PCP: [facility])  Pregnant: No  Office Follow Up:  Does the office need to follow up with this patient?: Yes  Instructions For The Office: Please call to schedule.  RN Note:  This pts phone died during the conversation. Rn called the office to let them know the disposition is to have a PAP in 2 weeks.  Symptoms  Reason For Call & Symptoms: Pt is calling back to say she had her physical on 04/23/14 but did not have her PAP smear at that time. Pt states when the labs came back she has not had a pap since 2012. This am when she urinated she had a bit of pinkish d/c when she wiped. There is no urinary tract symptoms. No pain. Pt is post-menopausal.  Reviewed Health History In EMR: Yes  Reviewed Medications In EMR: Yes  Reviewed Allergies In EMR: Yes  Reviewed Surgeries / Procedures: Yes  Date of Onset of Symptoms: 05/12/2014 OB / GYN:  LMP: Unknown  Guideline(s) Used:  Vaginal Bleeding - Abnormal  Disposition Per Guideline:   See Within 2 Weeks in Office  Reason For Disposition Reached:   Bleeding or spotting occurs after hysterectomy  Advice Given:  N/A  Patient Will Follow Care Advice:  YES

## 2014-05-12 NOTE — Telephone Encounter (Signed)
Pt called and stated she is having vaginal bleeding requesting pap in 2 wk .Can I sch?

## 2014-05-13 NOTE — Telephone Encounter (Signed)
Order was entered by Dr Sarajane Jews and I called the pt and informed her to expect a call with appt information.

## 2014-05-14 ENCOUNTER — Telehealth: Payer: Self-pay | Admitting: Obstetrics & Gynecology

## 2014-05-14 NOTE — Telephone Encounter (Signed)
LMTCB to offer patient an appointment with Dr. Sabra Heck for Monday, 05/17/14 at 12:00. This patient is an internal Dr. Referral.

## 2014-05-14 NOTE — Telephone Encounter (Signed)
Scheduled

## 2014-05-17 ENCOUNTER — Ambulatory Visit (INDEPENDENT_AMBULATORY_CARE_PROVIDER_SITE_OTHER): Payer: 59 | Admitting: Obstetrics & Gynecology

## 2014-05-17 ENCOUNTER — Encounter: Payer: Self-pay | Admitting: Obstetrics & Gynecology

## 2014-05-17 ENCOUNTER — Other Ambulatory Visit: Payer: Self-pay | Admitting: Obstetrics & Gynecology

## 2014-05-17 VITALS — BP 108/68 | HR 64 | Resp 16 | Ht 65.0 in | Wt 115.6 lb

## 2014-05-17 DIAGNOSIS — Z124 Encounter for screening for malignant neoplasm of cervix: Secondary | ICD-10-CM

## 2014-05-17 DIAGNOSIS — N950 Postmenopausal bleeding: Secondary | ICD-10-CM

## 2014-05-17 DIAGNOSIS — Z01419 Encounter for gynecological examination (general) (routine) without abnormal findings: Secondary | ICD-10-CM

## 2014-05-17 DIAGNOSIS — R921 Mammographic calcification found on diagnostic imaging of breast: Secondary | ICD-10-CM

## 2014-05-17 NOTE — Progress Notes (Signed)
58 y.o. G2P2 MarriedCaucasianF here for new patient visit.  Referred form Dr. Sarajane Jews due to post menopausal bleeding.  Pt went through menopause around age 51.  Has been on HRT since that time.  For the most part, dosage has been the same.   Bleeding occurred once after pt had episode of straining.  She is sure it was vaginal.  No significant hx of hemorrhoids.  Pt has not had recent Pap.  Mammogram overdue.  Review of records shows that she was to have a follow up six month exam over two years ago.  This hasn't been done.     No LMP recorded. Patient is postmenopausal.          Sexually active: Yes.    The current method of family planning is none.    Exercising: Yes.    walking and yoga Smoker:  Previous smoker  Health Maintenance: Pap:  2012-is due History of abnormal Pap:  no MMG:  02/27/10-mmg, 03/13/10-left diag, 10/24/10-6 month f/u-should have had 6 month f/u left breast Colonoscopy:  2013-repeat in 10 years BMD:   2014 TDaP:  05/05/09 Screening Labs: PCP, Hb today: PCP, Urine today: PCP   reports that she has quit smoking. Her smoking use included Cigarettes. She has a 10 pack-year smoking history. She has never used smokeless tobacco. She reports that she drinks alcohol. She reports that she does not use illicit drugs.  Past Medical History  Diagnosis Date  . Peptic ulcer     no problems  . Heart murmur   . Hyperlipidemia   . MVP (mitral valve prolapse)     no daily medication  . 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 Migraines   . Cervical disc disease   . Fibromyalgia     sees Dr. Estanislado Pandy     Past Surgical History  Procedure Laterality Date  . Cesarean section    . Cholecystectomy    . Cervical laminectomy    . Colonoscopy  06-06-12    per Dr. Olevia Perches, benign polyps, repeat in 10 yrs   . Hysteroscopy      years ago with Dr Radene Knee-? why was done    Current Outpatient Prescriptions  Medication Sig Dispense Refill  . aspirin 81 MG tablet Take 81 mg by mouth  daily.        . butalbital-acetaminophen-caffeine (FIORICET, ESGIC) 50-325-40 MG per tablet TAKE 1 TABLET BY MOUTH EVERY 6 HOURS AS NEEDED.  120 tablet  5  . cetirizine (ZYRTEC) 10 MG tablet Take 10 mg by mouth as needed.       . chlorzoxazone (PARAFON) 500 MG tablet TAKE 1 TABLET BY MOUTH FOUR TIMES DAILY AS NEEDED FOR MUSCLE SPASM  120 tablet  5  . cyclobenzaprine (FLEXERIL) 10 MG tablet Take 1 tablet (10 mg total) by mouth 3 (three) times daily as needed for muscle spasms.  270 tablet  3  . estrogen, conjugated,-medroxyprogesterone (PREMPRO) 0.625-2.5 MG per tablet TAKE 1 TABLET BY MOUTH EVERY DAY  90 tablet  3  . HYDROcodone-acetaminophen (NORCO) 10-325 MG per tablet Take 1 tablet by mouth every 6 (six) hours as needed for severe pain.  120 tablet  0  . LORazepam (ATIVAN) 0.5 MG tablet Take 1 tablet (0.5 mg total) by mouth every 8 (eight) hours as needed for anxiety.  60 tablet  2  . LOVAZA 1 G capsule TAKE 1 CAPSULE TWICE A DAY  180 capsule  3  . pantoprazole (PROTONIX) 40 MG  tablet TAKE ONE TABLET BY MOUTH ONE TIME DAILY  90 tablet  3  . pregabalin (LYRICA) 25 MG capsule Take 25 mg by mouth 4 (four) times daily as needed.       . rosuvastatin (CRESTOR) 10 MG tablet Take 1 tablet (10 mg total) by mouth daily.  90 tablet  3  . zolmitriptan (ZOMIG) 5 MG tablet TAKE ONE TABLET BY MOUTH AS NEEDED  30 tablet  3  . zolpidem (AMBIEN) 10 MG tablet TAKE 1 TABLET BY MOUTH EVERY DAY AT BEDTIME AS NEEDED  90 tablet  1  . DULoxetine (CYMBALTA) 30 MG capsule Take 30 mg by mouth daily. Take 3 capsules every morning       No current facility-administered medications for this visit.    Family History  Problem Relation Age of Onset  . Cancer      breast/fhx  . Heart disease      fhx  . Colon cancer Neg Hx   . Esophageal cancer Neg Hx   . Rectal cancer Neg Hx   . Stomach cancer Neg Hx   . Breast cancer Mother     lumpectomy in late 50/60  . Heart attack Father   . Congestive Heart Failure Father    . Cirrhosis Sister   . Cirrhosis Brother   . Diabetes type II Sister     and ? brother  . Arthritis Son     psoriatic    ROS:  Pertinent items are noted in HPI.  Otherwise, a comprehensive ROS was negative.  Exam:   BP 108/68  Pulse 64  Resp 16  Ht 5\' 5"  (1.651 m)  Wt 115 lb 9.6 oz (52.436 kg)  BMI 19.24 kg/m2   Height: 5\' 5"  (165.1 cm)  Ht Readings from Last 3 Encounters:  05/17/14 5\' 5"  (1.651 m)  04/23/14 5' 4.75" (1.645 m)  04/10/13 5' 5.25" (1.657 m)    General appearance: alert, cooperative and appears stated age Head: Normocephalic, without obvious abnormality, atraumatic Neck: no adenopathy, supple, symmetrical, trachea midline and thyroid normal to inspection and palpation Lungs: clear to auscultation bilaterally Breasts: normal appearance, no masses or tenderness Heart: regular rate and rhythm Abdomen: soft, non-tender; bowel sounds normal; no masses,  no organomegaly Extremities: extremities normal, atraumatic, no cyanosis or edema Skin: Skin color, texture, turgor normal. No rashes or lesions Lymph nodes: Cervical, supraclavicular, and axillary nodes normal. No abnormal inguinal nodes palpated Neurologic: Grossly normal   Pelvic: External genitalia:  no lesions              Urethra:  normal appearing urethra with no masses, tenderness or lesions              Bartholins and Skenes: normal                 Vagina: normal appearing vagina with normal color and discharge, no lesions              Cervix: no lesions              Pap taken: Yes.   Bimanual Exam:  Uterus:  normal size, contour, position, consistency, mobility, non-tender              Adnexa: normal adnexa and no mass, fullness, tenderness               Rectovaginal: Confirms               Anus:  normal sphincter tone, no  lesions  Endometrial biopsy recommended.  Discussed with patient.  Verbal and written consent obtained.    Procedure:  Speculum placed.  Cervix visualized and cleansed with  betadine prep.  A single toothed tenaculum was applied to the anterior lip of the cervix.  Endometrial pipelle was advanced through the cervix into the endometrial cavity without difficulty.  Pipelle passed to 7.0cm.  Scant tissue obtained.  Two passes performed due to this.  Suction applied and pipelle removed with good tissue sample obtained.  Tenculum removed.  No bleeding noted.  Patient tolerated procedure well.   A:  Episode of PMP bleeding Normal exam, otherwise On HRT Elevated lipids H/O Depakote inducted hepatitis Fibromyalgia  P:   Endometrial biopsy obtained.  Recommendations will be made pending results. Pap with HR HPV obtained today. Mammogram to be scheduled due to hx of lack of follow-up for six month interval     An After Visit Summary was printed and given to the patient.

## 2014-05-18 ENCOUNTER — Telehealth: Payer: Self-pay

## 2014-05-18 DIAGNOSIS — R921 Mammographic calcification found on diagnostic imaging of breast: Secondary | ICD-10-CM

## 2014-05-18 NOTE — Telephone Encounter (Signed)
Spoke with patient. Patient is aware and agreeable to date and time.   Routing to provider for final review. Patient agreeable to disposition. Will close encounter

## 2014-05-18 NOTE — Telephone Encounter (Addendum)
Left message to call [place] at [phone].  Patient is scheduled for follow up dx mammogram on 05/28/14 at 11:15am with 11:00am arrival at [facility].

## 2014-05-19 LAB — IPS PAP TEST WITH HPV

## 2014-05-28 ENCOUNTER — Telehealth: Payer: Self-pay | Admitting: Obstetrics & Gynecology

## 2014-05-28 ENCOUNTER — Ambulatory Visit
Admission: RE | Admit: 2014-05-28 | Discharge: 2014-05-28 | Disposition: A | Discharge status: Home / Self Care | Payer: 59 | Source: Ambulatory Visit | Attending: Obstetrics & Gynecology | Admitting: Obstetrics & Gynecology

## 2014-05-28 NOTE — Telephone Encounter (Signed)
Pt calling for results

## 2014-05-28 NOTE — Telephone Encounter (Signed)
Spoke with patient. Advised of results as seen below from [place]. Patient is agreeable and verbalizes understanding. Will call if she has any further bleeding.   Notes Recorded by Lyman Speller, MD on 05/23/2014 at 11:34 PM Inform pap showed ASCUS cells but HR HPV was negative so this is a "normal" Pap and no other follow up is needed. Endometrial biopsy was negative for abnormal cells. If she has any further bleeding, I need her to call. I would have her return for a PUS at that time, possible SHGM. Thanks.   Routing to provider for final review. Patient agreeable to disposition. Will close encounter

## 2014-06-02 ENCOUNTER — Telehealth: Payer: Self-pay | Admitting: Family Medicine

## 2014-06-02 MED ORDER — ALPRAZOLAM 0.5 MG PO TABS: 0.5000 mg | ORAL_TABLET | Freq: Three times a day (TID) | ORAL | Status: DC | PRN

## 2014-06-02 NOTE — Telephone Encounter (Signed)
Patient Information:  Caller Name: Roxana  Phone: [phone]  Patient: Emily Ward  Gender: Female  DOB: 01/22/1956  Age: 58 Years  PCP: Alysia [facility])  Pregnant: No  Office Follow Up:  Does the office need to follow up with this patient?: Yes  Instructions For The Office: Asking if medication for stress and anxiety can be called in to pharmacy or does she need to be seen in office? Was seen in office 08/21 and discussed medications with Dr. Sarajane Jews - was ordered Ativan. States no improvement with Ativan.   Symptoms  Reason For Call & Symptoms: Tonianne states she was seen in office on 04/23/14. States she has had low level stress , anxiety and intermittent headaches for a few months. States she was ordered Ativan but states she has had no improvement with Ativan. States Dr. Sarajane Jews mentioned other medications she could be prescribed. States she has a headache now. Per headache protocol has see provider within 2 weeks due to headache is a chronic symptom and lasting > 4 weeks. Joeanne asking can another medication for stress and anxiety be called in to pharmacy or does she need to be seen in office? Please call Jona at [phone]  Reviewed Health History In EMR: Yes  Reviewed Medications In EMR: Yes  Reviewed Allergies In EMR: Yes  Reviewed Surgeries / Procedures: Yes  Date of Onset of Symptoms: 04/23/2014  Treatments Tried: Ativan, Yoga  Treatments Tried Worked: No OB / GYN:  LMP: Unknown  Guideline(s) Used:  Headache  Disposition Per Guideline:   See Within 2 Weeks in Office  Reason For Disposition Reached:   Headache is a chronic symptom (recurrent or ongoing AND lasting > 4 weeks)  Advice Given:  N/A  Patient Will Follow Care Advice:  YES

## 2014-06-02 NOTE — Telephone Encounter (Signed)
Call in Lexapro 10 mg daily, #30 with 5 rf, also switch from Ativan to Xanax. Call in Xanax 0.5 mg to take every 8 hours prn anxiety, #90 with no rf

## 2014-06-02 NOTE — Telephone Encounter (Signed)
I spoke with pt and she has tried the Lexapro in the past and was changed to Cymbalta. Per Dr. Sarajane Jews cancel the Lexapro, advise pt to continue with Cymbalta and I called in the script for Xanax.

## 2014-07-05 ENCOUNTER — Encounter: Payer: Self-pay | Admitting: Obstetrics & Gynecology

## 2014-09-17 ENCOUNTER — Other Ambulatory Visit: Payer: Self-pay | Admitting: Family Medicine

## 2014-09-17 NOTE — Telephone Encounter (Signed)
Call in #90 with 2 rf 

## 2014-10-08 ENCOUNTER — Other Ambulatory Visit: Payer: Self-pay | Admitting: Family Medicine

## 2014-10-08 MED ORDER — BUTALBITAL-APAP-CAFFEINE 50-325-40 MG PO TABS: ORAL_TABLET | ORAL | Status: DC

## 2014-10-08 MED ORDER — CYCLOBENZAPRINE HCL 10 MG PO TABS: 10.0000 mg | ORAL_TABLET | Freq: Three times a day (TID) | ORAL | Status: DC | PRN

## 2014-10-08 NOTE — Telephone Encounter (Signed)
Scripts were sent to pharmacy.

## 2014-10-08 NOTE — Telephone Encounter (Signed)
Call in [place] #90 with 5 rf, also Fioricet #120 with 5 rf

## 2014-10-09 ENCOUNTER — Other Ambulatory Visit: Payer: Self-pay | Admitting: Family Medicine

## 2014-10-11 ENCOUNTER — Other Ambulatory Visit: Payer: Self-pay | Admitting: Family Medicine

## 2014-10-11 NOTE — Telephone Encounter (Signed)
Call in #90 with one rf 

## 2014-11-24 ENCOUNTER — Ambulatory Visit (INDEPENDENT_AMBULATORY_CARE_PROVIDER_SITE_OTHER): Payer: 59 | Admitting: Family Medicine

## 2014-11-24 ENCOUNTER — Encounter: Payer: Self-pay | Admitting: Family Medicine

## 2014-11-24 VITALS — BP 110/76 | HR 88 | Temp 98.6°F | Ht 65.0 in | Wt 117.0 lb

## 2014-11-24 DIAGNOSIS — S39011A Strain of muscle, fascia and tendon of abdomen, initial encounter: Secondary | ICD-10-CM

## 2014-11-24 DIAGNOSIS — R109 Unspecified abdominal pain: Secondary | ICD-10-CM | POA: Diagnosis not present

## 2014-11-24 LAB — POCT URINALYSIS DIPSTICK
Bilirubin, UA: NEGATIVE
Blood, UA: NEGATIVE
Glucose, UA: NEGATIVE
Ketones, UA: NEGATIVE
Leukocytes, UA: NEGATIVE
Nitrite, UA: NEGATIVE
Protein, UA: NEGATIVE
Spec Grav, UA: 1.015
Urobilinogen, UA: 0.2
pH, UA: 6

## 2014-11-24 NOTE — Progress Notes (Signed)
Dr. Frederico Hamman T. Santo Zahradnik, MD, [facility] [place], [address] Phone: [phone] Fax: [phone]  11/24/2014  Patient: Emily Ward, MRN: [id number], DOB: [birth year], 59 y.o.  Primary Physician:  Laurey Morale, MD  Chief Complaint: Flank Pain  Subjective:   Emily Ward is a 59 y.o. very pleasant female patient who presents with the following:  Thought that she pulled a muscle on her left ribcage. Drank some ETOH and was dancing and went down straight on her butt. Sunday when woke up, pain on L side, has been doing pelvic floor rehab. Has been doing yoga.  Yoga has been helping her fibro. Last night had a lot of trouble taking a deep breath or moving. Isolates out to the left side, anterior, just inferior to the ribs on the left.  Past Medical History, Surgical History, Social History, Family History, Problem List, Medications, and Allergies have been reviewed and updated if relevant.  ROS: GEN: Acute illness details above GI: Tolerating PO intake GU: maintaining adequate hydration and urination Pulm: No SOB Interactive and getting along well at home.  Otherwise, ROS is as per the HPI.   Objective:   BP 110/76 mmHg  Pulse 88  Temp(Src) 98.6 F (37 C) (Oral)  Ht 5\' 5"  (1.651 m)  Wt 117 lb (53.071 kg)  BMI 19.47 kg/m2   GEN: WDWN, NAD, Non-toxic, Alert & Oriented x 3 HEENT: Atraumatic, Normocephalic.  Ears and Nose: No external deformity. Chest wall nontender. Barrel sign negative.  This portion of the physical examination was chaperoned by Hedy Camara, CMA.  EXTR: No clubbing/cyanosis/edema NEURO: Normal gait.  PSYCH: Normally interactive. Conversant. Not depressed or anxious appearing.  Calm demeanor.   ABD: S, ND, + BS, No rebound, No HSM.  Acute pain is palpable just inferior to the ribs laterally in the region of the obliques rather than the rectus.  Deep breath as well as rotational  movement and sit up movements cause pain.  The ribs themselves are not directly tender.   Laboratory and Imaging Data: Results for orders placed or performed in visit on 11/24/14  POCT urinalysis dipstick  Result Value Ref Range   Color, UA yellow    Clarity, UA clear    Glucose, UA negative    Bilirubin, UA negative    Ketones, UA negative    Spec Grav, UA 1.015    Blood, UA negative    pH, UA 6.0    Protein, UA negative    Urobilinogen, UA 0.2    Nitrite, UA negative    Leukocytes, UA Negative      Assessment and Plan:   Abdominal muscle strain, initial encounter  Bilateral flank pain - Plan: POCT urinalysis dipstick  Insertional external versus internal oblique strain.  More likely external.  I reassured the patient.  This should not be consistent with rib fracture.  Not consistent with intra-abdominal pathology.  With gentle stretching and time this should resolve in the next 2-3 weeks at most.  Follow-up: prn  New Prescriptions   No medications on file   Orders Placed This Encounter  Procedures  . POCT urinalysis dipstick    Signed,  Frederico Hamman T. Quintavious Rinck, MD   Patient's Medications  New Prescriptions   No medications on file  Previous Medications   ALPRAZOLAM (XANAX) 0.5 MG TABLET    TAKE 1 TABLET BY MOUTH EVERY 8 HOURS AS NEEDED   ASPIRIN 81 MG TABLET  Take 81 mg by mouth daily.     BUTALBITAL-ACETAMINOPHEN-CAFFEINE (FIORICET, ESGIC) 50-325-40 MG PER TABLET    TAKE 1 TABLET BY MOUTH EVERY 6 HOURS AS NEEDED.   CETIRIZINE (ZYRTEC) 10 MG TABLET    Take 10 mg by mouth as needed.    CHLORZOXAZONE (PARAFON) 500 MG TABLET    TAKE 1 TABLET BY MOUTH FOUR TIMES DAILY AS NEEDED FOR MUSCLE SPASM   CYCLOBENZAPRINE (FLEXERIL) 10 MG TABLET    Take 1 tablet (10 mg total) by mouth 3 (three) times daily as needed for muscle spasms.   DULOXETINE (CYMBALTA) 30 MG CAPSULE    Take 30 mg by mouth daily. Take 3 capsules every morning   ESTROGEN, CONJUGATED,-MEDROXYPROGESTERONE  (PREMPRO) 0.625-2.5 MG PER TABLET    TAKE 1 TABLET BY MOUTH EVERY DAY   HYDROCODONE-ACETAMINOPHEN (NORCO) 10-325 MG PER TABLET    Take 1 tablet by mouth every 6 (six) hours as needed for severe pain.   LOVAZA 1 G CAPSULE    TAKE 1 CAPSULE TWICE A DAY   PANTOPRAZOLE (PROTONIX) 40 MG TABLET    TAKE ONE TABLET BY MOUTH ONE TIME DAILY   PREGABALIN (LYRICA) 25 MG CAPSULE    Take 25 mg by mouth 4 (four) times daily as needed.    ROSUVASTATIN (CRESTOR) 10 MG TABLET    Take 1 tablet (10 mg total) by mouth daily.   ZOLMITRIPTAN (ZOMIG) 5 MG TABLET    TAKE ONE TABLET BY MOUTH AS NEEDED   ZOLPIDEM (AMBIEN) 10 MG TABLET    TAKE 1 TABLET BY MOUTH EVERY NIGHT AT BEDTIME AS NEEDED  Modified Medications   No medications on file  Discontinued Medications   No medications on file

## 2014-11-24 NOTE — Progress Notes (Signed)
Pre visit review using our clinic review tool, if applicable. No additional management support is needed unless otherwise documented below in the visit note. 

## 2014-11-25 ENCOUNTER — Ambulatory Visit (INDEPENDENT_AMBULATORY_CARE_PROVIDER_SITE_OTHER): Payer: 59 | Admitting: Family Medicine

## 2014-11-25 ENCOUNTER — Telehealth: Payer: Self-pay | Admitting: Family Medicine

## 2014-11-25 ENCOUNTER — Encounter: Payer: Self-pay | Admitting: Family Medicine

## 2014-11-25 ENCOUNTER — Ambulatory Visit (INDEPENDENT_AMBULATORY_CARE_PROVIDER_SITE_OTHER)
Admission: RE | Admit: 2014-11-25 | Discharge: 2014-11-25 | Disposition: A | Discharge status: Home / Self Care | Payer: 59 | Source: Ambulatory Visit | Attending: Family Medicine | Admitting: Family Medicine

## 2014-11-25 VITALS — BP 111/72 | HR 85 | Temp 98.7°F | Ht 65.0 in | Wt 117.2 lb

## 2014-11-25 DIAGNOSIS — R0789 Other chest pain: Secondary | ICD-10-CM | POA: Insufficient documentation

## 2014-11-25 NOTE — Progress Notes (Signed)
   Subjective:    Patient ID: Emily Ward, female    DOB: May 30, 1956, 59 y.o.   MRN: [id number]  HPI   59 year old female pt with history  of osteoporosis Dr. Barbie Banner with history of fibromyalgia presents with continued chest wall pain.   She was seen by Dr. Lorelei Pont for similar pain on 3/23 ( yesterday) Felt she had MSK strain/sprain.  Recommended rest and stretching.  Pain started 6 days ago. Left lateral chest wall. 2 days prior to pain starting she fell on her butt dancing, no direct hit to side, did catch herself on hands no pain at that time. No fever, no SOB, no cough, no flu like symptoms. No dysuria.   Since yesterday she has had constant pain, pain with deep breaths, cannot get comfortable, pain worse with moving.  Tried 600 mg of ibuprofen last night.. No help, took old vicodoin..  Flexeril helped her sleep someWent to sleep. Woke up this AM with worse pain than before   Review of Systems  Constitutional: Negative for fever and fatigue.  HENT: Negative for ear pain.   Eyes: Negative for pain.  Respiratory: Negative for chest tightness and shortness of breath.   Cardiovascular: Negative for chest pain, palpitations and leg swelling.  Gastrointestinal: Negative for abdominal pain.  Genitourinary: Negative for dysuria.       Objective:   Physical Exam  Constitutional: Vital signs are normal. She appears well-developed and well-nourished. She is cooperative.  Non-toxic appearance. She does not appear ill. No distress.  HENT:  Head: Normocephalic.  Right Ear: Hearing, tympanic membrane, external ear and ear canal normal. Tympanic membrane is not erythematous, not retracted and not bulging.  Left Ear: Hearing, tympanic membrane, external ear and ear canal normal. Tympanic membrane is not erythematous, not retracted and not bulging.  Nose: No mucosal edema or rhinorrhea. Right sinus exhibits no maxillary sinus tenderness and no frontal sinus tenderness. Left sinus  exhibits no maxillary sinus tenderness and no frontal sinus tenderness.  Mouth/Throat: Uvula is midline, oropharynx is clear and moist and mucous membranes are normal.  Eyes: Conjunctivae, EOM and lids are normal. Pupils are equal, round, and reactive to light. Lids are everted and swept, no foreign bodies found.  Neck: Trachea normal and normal range of motion. Neck supple. Carotid bruit is not present. No thyroid mass and no thyromegaly present.  Cardiovascular: Normal rate, regular rhythm, S1 normal, S2 normal, normal heart sounds, intact distal pulses and normal pulses.  Exam reveals no gallop and no friction rub.   No murmur heard. Pulmonary/Chest: Effort normal and breath sounds normal. No tachypnea. No respiratory distress. She has no decreased breath sounds. She has no wheezes. She has no rhonchi. She has no rales. She exhibits tenderness and bony tenderness.    Abdominal: Soft. Normal appearance and bowel sounds are normal. There is no splenomegaly or hepatomegaly. There is no tenderness. There is no CVA tenderness. No hernia.  Neurological: She is alert.  Skin: Skin is warm, dry and intact. No rash noted.  No rash like shingles  Psychiatric: Her speech is normal and behavior is normal. Judgment and thought content normal. Her mood appears not anxious. Cognition and memory are normal. She does not exhibit a depressed mood.          Assessment & Plan:

## 2014-11-25 NOTE — Telephone Encounter (Signed)
Pt is seeing Dr Diona Browner now.

## 2014-11-25 NOTE — Progress Notes (Signed)
Pre visit review using our clinic review tool, if applicable. No additional management support is needed unless otherwise documented below in the visit note. 

## 2014-11-25 NOTE — Telephone Encounter (Signed)
Noted  

## 2014-11-25 NOTE — Patient Instructions (Addendum)
Try to take deep breaths as able. Can use muscle relaxant as needed, use parafon for muscle spasm. Can use vicodin for pain. We will call with X-ray results. Do lift greater 5 lbs.

## 2014-11-25 NOTE — Telephone Encounter (Signed)
Patient Name: Emily Ward  DOB: 15-Mar-1956    Initial Comment Caller states she saw MD yesterday. Having left flank pain after fall Friday. Pain much worse today.   Nurse Assessment  Nurse: Mallie Mussel, RN, Alveta Heimlich Date/Time Eilene Ghazi Time): 11/25/2014 12:03:38 PM  Confirm and document reason for call. If symptomatic, describe symptoms. ---Caller states that she fell Friday on her buttocks. She was seen by the doctor yesterday. Checked her urine and it was negative. She is having much worse pain today. The pain is just under her ribs on the left side. Denies blood in her urine. Taking a deep breath makes the pain worse. She rates her pain as 7-9 or 10 depending on movement. Denies fever. She has Vicodin for previous problem. It helped a little but not much. She was barely able to get out of bed this morning.  Has the patient traveled out of the country within the last 30 days? ---No  Does the patient require triage? ---Yes  Related visit to physician within the last 2 weeks? ---Yes  Does the PT have any chronic conditions? (i.e. diabetes, asthma, etc.) ---Yes  List chronic conditions. ---Fibromyalgia, Interstitial Cystitis     Guidelines    Guideline Title Affirmed Question Affirmed Notes  Flank Pain Patient sounds very sick or weak to the triager    Final Disposition User   Go to ED Now (or PCP triage) Mallie Mussel, RN, Alveta Heimlich    Comments  Appointment scheduled for 2:30pm with Dr. Diona Browner at [facility]

## 2014-11-25 NOTE — Assessment & Plan Note (Signed)
Likely muscular strain versus small rib fracture. Will eval with X-ray.  Will use muscle relaxant and pain meds she already has for pain.

## 2014-11-30 ENCOUNTER — Other Ambulatory Visit: Payer: Self-pay | Admitting: Family Medicine

## 2014-12-01 ENCOUNTER — Other Ambulatory Visit: Payer: Self-pay | Admitting: Family Medicine

## 2014-12-02 ENCOUNTER — Other Ambulatory Visit: Payer: Self-pay | Admitting: Family Medicine

## 2015-02-09 ENCOUNTER — Ambulatory Visit (INDEPENDENT_AMBULATORY_CARE_PROVIDER_SITE_OTHER): Payer: 59 | Admitting: Family Medicine

## 2015-02-09 ENCOUNTER — Encounter: Payer: Self-pay | Admitting: Family Medicine

## 2015-02-09 VITALS — BP 110/60 | HR 107 | Temp 100.8°F | Wt 116.6 lb

## 2015-02-09 DIAGNOSIS — J0100 Acute maxillary sinusitis, unspecified: Secondary | ICD-10-CM

## 2015-02-09 MED ORDER — AMOXICILLIN-POT CLAVULANATE 875-125 MG PO TABS: 1.0000 | ORAL_TABLET | Freq: Two times a day (BID) | ORAL | Status: DC

## 2015-02-09 MED ORDER — HYDROCODONE-HOMATROPINE 5-1.5 MG/5ML PO SYRP: 5.0000 mL | ORAL_SOLUTION | ORAL | Status: DC | PRN

## 2015-02-09 NOTE — Progress Notes (Signed)
   Subjective:    Patient ID: Emily Ward, female    DOB: 1956/02/08, 59 y.o.   MRN: [id number]  HPI Here for 5 days of fever, HA, sinus pressure, blowing green mucus from the nose, and a cough. Using Mucinex and this helps a little. She has seen no one else for this.    Review of Systems  Constitutional: Positive for fever.  HENT: Positive for congestion, postnasal drip and sinus pressure. Negative for ear pain.   Eyes: Negative.   Respiratory: Positive for cough and chest tightness.   Cardiovascular: Negative.        Objective:   Physical Exam  Constitutional: She appears well-developed and well-nourished.  HENT:  Right Ear: External ear normal.  Left Ear: External ear normal.  Nose: Nose normal.  Mouth/Throat: Oropharynx is clear and moist.  Eyes: Conjunctivae are normal.  Cardiovascular: Normal rate, regular rhythm, normal heart sounds and intact distal pulses.   Pulmonary/Chest: Effort normal and breath sounds normal. No respiratory distress. She has no wheezes. She has no rales.  Lymphadenopathy:    She has no cervical adenopathy.          Assessment & Plan:  Sinusitis. Given Augmentin. Drink fluids.

## 2015-02-09 NOTE — Progress Notes (Signed)
Pre visit review using our clinic review tool, if applicable. No additional management support is needed unless otherwise documented below in the visit note. 

## 2015-03-24 ENCOUNTER — Telehealth: Payer: Self-pay | Admitting: Family Medicine

## 2015-03-24 NOTE — Telephone Encounter (Signed)
Pt needs new rx hydrocodone °

## 2015-03-25 MED ORDER — HYDROCODONE-ACETAMINOPHEN 10-325 MG PO TABS: 1.0000 | ORAL_TABLET | Freq: Four times a day (QID) | ORAL | Status: DC | PRN

## 2015-03-25 NOTE — Telephone Encounter (Signed)
done

## 2015-03-25 NOTE — Telephone Encounter (Signed)
Script is ready for pick up and I left a voice message for pt. 

## 2015-04-04 ENCOUNTER — Other Ambulatory Visit: Payer: Self-pay | Admitting: Family Medicine

## 2015-04-12 ENCOUNTER — Other Ambulatory Visit: Payer: Self-pay | Admitting: Family Medicine

## 2015-04-12 NOTE — Telephone Encounter (Signed)
Pt was last here in office on 02/09/15 and is due for refill.

## 2015-04-12 NOTE — Telephone Encounter (Signed)
Ok to fill for one month

## 2015-04-21 ENCOUNTER — Encounter: Payer: Self-pay | Admitting: Family Medicine

## 2015-04-21 ENCOUNTER — Other Ambulatory Visit (INDEPENDENT_AMBULATORY_CARE_PROVIDER_SITE_OTHER): Payer: 59

## 2015-04-21 DIAGNOSIS — Z0000 Encounter for general adult medical examination without abnormal findings: Secondary | ICD-10-CM | POA: Diagnosis not present

## 2015-04-21 LAB — POCT URINALYSIS DIPSTICK
Bilirubin, UA: NEGATIVE
Glucose, UA: NEGATIVE
Ketones, UA: NEGATIVE
Nitrite, UA: NEGATIVE
Protein, UA: NEGATIVE
Spec Grav, UA: 1.02
Urobilinogen, UA: 0.2
pH, UA: 5.5

## 2015-04-21 LAB — CBC WITH DIFFERENTIAL/PLATELET
Basophils Absolute: 0 10*3/uL (ref 0.0–0.1)
Basophils Relative: 0.4 % (ref 0.0–3.0)
Eosinophils Absolute: 0.3 10*3/uL (ref 0.0–0.7)
Eosinophils Relative: 4.4 % (ref 0.0–5.0)
HCT: 43.8 % (ref 36.0–46.0)
Hemoglobin: 14.4 g/dL (ref 12.0–15.0)
Lymphocytes Relative: 41.5 % (ref 12.0–46.0)
Lymphs Abs: 2.4 10*3/uL (ref 0.7–4.0)
MCHC: 32.8 g/dL (ref 30.0–36.0)
MCV: 95.6 fl (ref 78.0–100.0)
Monocytes Absolute: 0.4 10*3/uL (ref 0.1–1.0)
Monocytes Relative: 6.8 % (ref 3.0–12.0)
Neutro Abs: 2.7 10*3/uL (ref 1.4–7.7)
Neutrophils Relative %: 46.9 % (ref 43.0–77.0)
Platelets: 188 10*3/uL (ref 150.0–400.0)
RBC: 4.58 Mil/uL (ref 3.87–5.11)
RDW: 14 % (ref 11.5–15.5)
WBC: 5.7 10*3/uL (ref 4.0–10.5)

## 2015-04-21 LAB — HEPATIC FUNCTION PANEL
ALT: 7 U/L (ref 0–35)
AST: 16 U/L (ref 0–37)
Albumin: 4 g/dL (ref 3.5–5.2)
Alkaline Phosphatase: 62 U/L (ref 39–117)
Bilirubin, Direct: 0.1 mg/dL (ref 0.0–0.3)
Total Bilirubin: 0.2 mg/dL (ref 0.2–1.2)
Total Protein: 6.7 g/dL (ref 6.0–8.3)

## 2015-04-21 LAB — BASIC METABOLIC PANEL
BUN: 20 mg/dL (ref 6–23)
CO2: 27 mEq/L (ref 19–32)
Calcium: 9.6 mg/dL (ref 8.4–10.5)
Chloride: 109 mEq/L (ref 96–112)
Creatinine, Ser: 0.67 mg/dL (ref 0.40–1.20)
GFR: 96.54 mL/min (ref >60.00)
Glucose, Bld: 98 mg/dL (ref 70–99)
Potassium: 5 mEq/L (ref 3.5–5.1)
Sodium: 142 mEq/L (ref 135–145)

## 2015-04-21 LAB — LIPID PANEL
Cholesterol: 201 mg/dL — ABNORMAL HIGH (ref 0–200)
HDL: 66.2 mg/dL (ref >39.00)
LDL Cholesterol: 113 mg/dL — ABNORMAL HIGH (ref 0–99)
NonHDL: 134.47
Total CHOL/HDL Ratio: 3
Triglycerides: 107 mg/dL (ref 0.0–149.0)
VLDL: 21.4 mg/dL (ref 0.0–40.0)

## 2015-04-21 LAB — TSH: TSH: 2.08 u[IU]/mL (ref 0.35–4.50)

## 2015-04-22 ENCOUNTER — Other Ambulatory Visit: Payer: 59

## 2015-04-27 ENCOUNTER — Other Ambulatory Visit: Payer: Self-pay | Admitting: Family Medicine

## 2015-04-28 ENCOUNTER — Other Ambulatory Visit: Payer: Self-pay | Admitting: Family Medicine

## 2015-04-29 ENCOUNTER — Encounter: Payer: Self-pay | Admitting: Family Medicine

## 2015-04-29 ENCOUNTER — Ambulatory Visit (INDEPENDENT_AMBULATORY_CARE_PROVIDER_SITE_OTHER): Payer: 59 | Admitting: Family Medicine

## 2015-04-29 ENCOUNTER — Ambulatory Visit (INDEPENDENT_AMBULATORY_CARE_PROVIDER_SITE_OTHER)
Admission: RE | Admit: 2015-04-29 | Discharge: 2015-04-29 | Disposition: A | Discharge status: Home / Self Care | Payer: 59 | Source: Ambulatory Visit | Attending: Family Medicine | Admitting: Family Medicine

## 2015-04-29 VITALS — BP 104/68 | HR 80 | Temp 98.6°F | Ht 65.0 in | Wt 114.0 lb

## 2015-04-29 DIAGNOSIS — R0602 Shortness of breath: Secondary | ICD-10-CM | POA: Diagnosis not present

## 2015-04-29 DIAGNOSIS — Z0000 Encounter for general adult medical examination without abnormal findings: Secondary | ICD-10-CM

## 2015-04-29 MED ORDER — CYCLOBENZAPRINE HCL 10 MG PO TABS: 10.0000 mg | ORAL_TABLET | Freq: Three times a day (TID) | ORAL | Status: DC | PRN

## 2015-04-29 NOTE — Progress Notes (Signed)
Pre visit review using our clinic review tool, if applicable. No additional management support is needed unless otherwise documented below in the visit note. Pt will get flu vaccine at work. 

## 2015-04-29 NOTE — Progress Notes (Signed)
   Subjective:    Patient ID: Emily Ward, female    DOB: 02-17-1956, 59 y.o.   MRN: [id number]  HPI 59 yr old female for a cpx. She feels well but does mention some mild SOB on exertion at times. No chest pain, no coughing. She is a former smoker. 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    Review of Systems  Constitutional: Negative.   HENT: Negative.   Eyes: Negative.   Respiratory: Positive for shortness of breath. Negative for apnea, cough, choking, chest tightness, wheezing and stridor.   Cardiovascular: Negative.   Gastrointestinal: Negative.   Genitourinary: Negative for dysuria, urgency, frequency, hematuria, flank pain, decreased urine volume, enuresis, difficulty urinating, pelvic pain and dyspareunia.  Musculoskeletal: Negative.   Skin: Negative.   Neurological: Negative.   Psychiatric/Behavioral: Negative.        Objective:   Physical Exam  Constitutional: She is oriented to person, place, and time. She appears well-developed and well-nourished. No distress.  HENT:  Head: Normocephalic and atraumatic.  Right Ear: External ear normal.  Left Ear: External ear normal.  Nose: Nose normal.  Mouth/Throat: Oropharynx is clear and moist. No oropharyngeal exudate.  Eyes: Conjunctivae and EOM are normal. Pupils are equal, round, and reactive to light. No scleral icterus.  Neck: Normal range of motion. Neck supple. No JVD present. No thyromegaly present.  Cardiovascular: Normal rate, regular rhythm, normal heart sounds and intact distal pulses.  Exam reveals no gallop and no friction rub.   No murmur heard. EKG normal   Pulmonary/Chest: Effort normal and breath sounds normal. No respiratory distress. She has no wheezes. She has no rales. She exhibits no tenderness.  Abdominal: Soft. Bowel sounds are normal. She exhibits no distension and no mass. There is no tenderness. There is no rebound and no guarding.  Musculoskeletal: Normal range of motion. She  exhibits no edema or tenderness.  Lymphadenopathy:    She has no cervical adenopathy.  Neurological: She is alert and oriented to person, place, and time. She has normal reflexes. No cranial nerve deficit. She exhibits normal muscle tone. Coordination normal.  Skin: Skin is warm and dry. No rash noted. No erythema.  Psychiatric: She has a normal mood and affect. Her behavior is normal. Judgment and thought content normal.          Assessment & Plan:  Well exam. We discussed diet and exercise advice. With her SOB I wondered if this represents early COPD. We will set up a CXR soon

## 2015-05-03 ENCOUNTER — Other Ambulatory Visit: Payer: Self-pay | Admitting: Family Medicine

## 2015-05-03 ENCOUNTER — Telehealth: Payer: Self-pay | Admitting: Obstetrics & Gynecology

## 2015-05-03 DIAGNOSIS — N950 Postmenopausal bleeding: Secondary | ICD-10-CM

## 2015-05-03 NOTE — Telephone Encounter (Signed)
Left message to call Kaitlyn at [phone]. 

## 2015-05-03 NOTE — Telephone Encounter (Signed)
I think needs sonohysterogram and possible repeat endometrial biopsy. Thanks.

## 2015-05-03 NOTE — Telephone Encounter (Signed)
Patient is having some unusual bleeding. Last seen 05/17/14, no paper chart.

## 2015-05-03 NOTE — Telephone Encounter (Signed)
Spoke with patient. Patient was last seen on 05/17/2014 for aex and PMB. Pap was ASCUS with negative HRHPV. EMB was negative for any abnormal cells. Patient states that she has not had any further bleeding since that appointment until today. When she went to the restroom today she states she had "dark red blood that was like the second or third day of my cycle. It was mucousy and I am having cramping just like when I used to have my period." States she is still having light bleeding. Is wearing a panti liner. Is on Prempro 0.625-2.5 mg tablets daily. Denies missing any pills. Had a physical last week with PCP. Advised will need to speak with Dr.Miller and return call with further recommendations. Patient is agreeable.  Dr.Miller, per last OV note patient may need PUS with possible SHGM. Would you like me to schedule at this time?

## 2015-05-04 ENCOUNTER — Telehealth: Payer: Self-pay | Admitting: Obstetrics & Gynecology

## 2015-05-04 NOTE — Telephone Encounter (Signed)
Return call to Kaitlyn. °

## 2015-05-04 NOTE — Telephone Encounter (Signed)
Called patient to review benefits for procedure. Left voicemail to call back and review. If Becky unavailable, please speak with Janett Billow or Seth Bake. Notes in guarantor. Separate staff message sent.

## 2015-05-04 NOTE — Telephone Encounter (Signed)
Spoke with patient. Advised of message as seen below from [place]. Patient is agreeable. SHGM and possible EMB scheduled for 05/05/2015 at 2 pm with 2:30 pm consult with Dr.Miller. Patient is agreeable to date and time. Orders placed for precert.  Cc: Theresia Lo  Routing to provider for final review. Patient agreeable to disposition. Will close encounter.

## 2015-05-05 ENCOUNTER — Ambulatory Visit (INDEPENDENT_AMBULATORY_CARE_PROVIDER_SITE_OTHER): Payer: 59 | Admitting: Obstetrics & Gynecology

## 2015-05-05 ENCOUNTER — Other Ambulatory Visit: Payer: Self-pay | Admitting: Obstetrics & Gynecology

## 2015-05-05 ENCOUNTER — Ambulatory Visit (INDEPENDENT_AMBULATORY_CARE_PROVIDER_SITE_OTHER): Payer: 59

## 2015-05-05 ENCOUNTER — Encounter: Payer: Self-pay | Admitting: Obstetrics & Gynecology

## 2015-05-05 VITALS — BP 124/72 | HR 68 | Resp 12 | Wt 115.0 lb

## 2015-05-05 DIAGNOSIS — Z124 Encounter for screening for malignant neoplasm of cervix: Secondary | ICD-10-CM

## 2015-05-05 DIAGNOSIS — N950 Postmenopausal bleeding: Secondary | ICD-10-CM

## 2015-05-05 MED ORDER — CONJ ESTROG-MEDROXYPROGEST ACE 0.45-1.5 MG PO TABS: 1.0000 | ORAL_TABLET | Freq: Every day | ORAL | Status: DC

## 2015-05-05 NOTE — Telephone Encounter (Signed)
Call in #120 with 5 rf 

## 2015-05-05 NOTE — Progress Notes (Signed)
59 y.o. Emily Ward here for a pelvic ultrasound with sonohystogram due to postmenopausal bleeding.  Pt had episode of this last year and was referred to me by Dr. Sarajane Jews for evaluation.  Endometrial biopsy was performed 05/17/14 and was negative but cellularity was scan.  Pt was advised to call if had any additional bleeding and she had another episode about a week ago.  The bleeding was preceded by some cramping and the bleeding was dark and old in appearance.  Pt did not have an ultrasound last year so was scheduled for this today.   She is on HRT--prem/pro and does not miss pills.  Had physical exam 04/29/15 with Dr. Sarajane Jews.  No pap was done as pap was done 1 year ago.  No LMP recorded. Patient is postmenopausal.  Contraception: PMP  Technique:  Both transabdominal and transvaginal ultrasound examinations of the pelvis were performed. Transabdominal technique was performed for global imaging of the pelvis including uterus, ovaries, adnexal regions, and pelvic cul-de-sac.  It was necessary to proceed with endovaginal exam following the abdominal ultrasound transabdominal exam to visualize the endometrium and adnexa.  Color and duplex Doppler ultrasound was utilized to evaluate blood flow to the ovaries.   FINDINGS: Uterus:  5.5 x 3.9 x 2.3cm without fibroids Endometrium: 4.31mm Adnexa:  Left: 1.5 x 0.9 x 0.7cm     Right: 1.2 x 0.8 x 0.6cm.  Ovaries are atrophic Cul de sac: no free fluid  SHSG:  After obtaining appropriate verbal consent from patient, the cervix was visualized using a speculum.  Pap obtained.  Cervix then prepped with betadine.  A tenaculum  was applied to the cervix.  Dilation of the cervix was not necessary. The catheter was passed into the uterus and sterile saline introduced, with the following findings: no intracavitary lesions.  Endometrial biopsy recommended. Verbal and written consent obtained.  Speculum placed.  Cervix visualized and cleansed with betadine prep.  A  single toothed tenaculum was not applied to the anterior lip of the cervix.  Endometrial pipelle was advanced through the cervix into the endometrial cavity without difficulty.  Pipelle passed to 6.5cm.  Suction applied and pipelle removed with good tissue sample obtained.  Tenculum removed.  No bleeding noted.  Patient tolerated procedure well.  All instruments removed.  Assessment: Postmenopausal bleeding x 2 with prior episode about a year ago Normal ultrasound  Plan:  Endometrial biopsy and Pap pending D/w pt either decreasing estrogen in HRT or increasing progesterone to see if can keep this from occuring again.  Pt would like to decrease dosage.  HRT changed to prem/pro 0.45/1.5mg .  Savings coupon provided.  Pt will let me know if has any further bleeding and/or experiencing any issues with change in HRT.  ~15 minutes spent with patient >50% of time was in face to face discussion of above.

## 2015-05-06 ENCOUNTER — Telehealth: Payer: Self-pay | Admitting: Emergency Medicine

## 2015-05-06 NOTE — Telephone Encounter (Signed)
Called patient LMTCB about annual appointment.

## 2015-05-06 NOTE — Telephone Encounter (Signed)
Patient walked into office. Has questions about her prescription. Dr. Sabra Heck wrote a new prescription for Prempro for her but gave a Premarin savings card.  Spoke with patient regarding Premarin versus Prempro. Patient does not want to change to anything that will be more than one tablet. Internet search with patient in office, there are no active savings offers for Prempro at this time.  She does want Dr. Sabra Heck to know that she just purchased one month of her prior dosage of Prempro and will finish her higher dose prior to starting the lower dose tablets that were ordered yesterday. Patient states she does not need to be contacted back unless Dr. Sabra Heck thinks that she cannot finish out the final pack that she has before switching to lower dose.  Advised will send message to Dr. Sabra Heck. Patient agreeable.

## 2015-05-06 NOTE — Telephone Encounter (Signed)
I'm sorry.  I thought that savings card worked for both premarin and prem/pro.  Thanks for the clarification.

## 2015-05-10 ENCOUNTER — Encounter: Payer: Self-pay | Admitting: Family Medicine

## 2015-05-10 LAB — IPS PAP SMEAR ONLY

## 2015-05-12 ENCOUNTER — Telehealth: Payer: Self-pay

## 2015-05-12 NOTE — Telephone Encounter (Signed)
Lmtcb//kn 

## 2015-05-12 NOTE — Telephone Encounter (Signed)
-----   Message from Megan Salon, MD sent at 05/12/2015  2:06 PM EDT ----- Please call and inform pt her endometrial biopsy was negative for abnormal cells.  Her pap was normal too.  Send this result to her via [place].  She doesn't need to do anything else except call with future bleeding and/or any new issues.

## 2015-05-28 ENCOUNTER — Other Ambulatory Visit: Payer: Self-pay | Admitting: Family Medicine

## 2015-06-09 NOTE — Telephone Encounter (Signed)
Patient notified of results.//kn 

## 2015-07-23 ENCOUNTER — Other Ambulatory Visit: Payer: Self-pay | Admitting: Adult Health

## 2015-07-26 ENCOUNTER — Other Ambulatory Visit: Payer: Self-pay | Admitting: Adult Health

## 2015-07-27 NOTE — Telephone Encounter (Signed)
Call in #90 with one rf 

## 2015-10-20 ENCOUNTER — Other Ambulatory Visit: Payer: Self-pay | Admitting: Family Medicine

## 2015-10-20 NOTE — Telephone Encounter (Signed)
Ok to refill 

## 2015-10-20 NOTE — Telephone Encounter (Signed)
Call in #120 with 5 rf 

## 2015-10-24 ENCOUNTER — Telehealth: Payer: Self-pay | Admitting: Family Medicine

## 2015-10-24 NOTE — Telephone Encounter (Signed)
Pt request refill  HYDROcodone-acetaminophen (NORCO) 10-325 MG per tablet 120 tabs   Last refill 03/25/2015

## 2015-10-25 MED ORDER — HYDROCODONE-ACETAMINOPHEN 10-325 MG PO TABS: 1.0000 | ORAL_TABLET | Freq: Four times a day (QID) | ORAL | Status: DC | PRN

## 2015-10-25 NOTE — Telephone Encounter (Signed)
Script is ready for pick up and left a voice message. 

## 2015-10-25 NOTE — Telephone Encounter (Signed)
done

## 2015-12-05 ENCOUNTER — Other Ambulatory Visit: Payer: Self-pay | Admitting: Family Medicine

## 2016-01-12 ENCOUNTER — Other Ambulatory Visit: Payer: Self-pay | Admitting: Family Medicine

## 2016-01-12 NOTE — Telephone Encounter (Signed)
Call in #90 with one rf 

## 2016-02-01 ENCOUNTER — Other Ambulatory Visit: Payer: Self-pay | Admitting: Family Medicine

## 2016-02-28 ENCOUNTER — Other Ambulatory Visit: Payer: Self-pay | Admitting: Family Medicine

## 2016-04-01 ENCOUNTER — Other Ambulatory Visit: Payer: Self-pay | Admitting: Family Medicine

## 2016-04-02 NOTE — Telephone Encounter (Signed)
Call in #120 with 5 rf 

## 2016-04-20 ENCOUNTER — Other Ambulatory Visit: Payer: 59

## 2016-04-30 ENCOUNTER — Encounter: Payer: 59 | Admitting: Family Medicine

## 2016-05-06 ENCOUNTER — Other Ambulatory Visit: Payer: Self-pay | Admitting: Family Medicine

## 2016-05-08 NOTE — Telephone Encounter (Signed)
NO, we gave her a 6 mont supply in May

## 2016-05-09 ENCOUNTER — Telehealth: Payer: Self-pay | Admitting: Family Medicine

## 2016-05-09 NOTE — Telephone Encounter (Signed)
Pt said she did not get refills in may on [place]. Pt has an appt on 05-15-16 for cpx. [facility] [address]

## 2016-05-11 ENCOUNTER — Other Ambulatory Visit (INDEPENDENT_AMBULATORY_CARE_PROVIDER_SITE_OTHER): Payer: 59

## 2016-05-11 ENCOUNTER — Other Ambulatory Visit: Payer: Self-pay | Admitting: Family Medicine

## 2016-05-11 DIAGNOSIS — Z0000 Encounter for general adult medical examination without abnormal findings: Secondary | ICD-10-CM | POA: Diagnosis not present

## 2016-05-11 LAB — CBC WITH DIFFERENTIAL/PLATELET
Basophils Absolute: 0 10*3/uL (ref 0.0–0.1)
Basophils Relative: 0.5 % (ref 0.0–3.0)
Eosinophils Absolute: 0.2 10*3/uL (ref 0.0–0.7)
Eosinophils Relative: 4.3 % (ref 0.0–5.0)
HCT: 44 % (ref 36.0–46.0)
Hemoglobin: 14.9 g/dL (ref 12.0–15.0)
Lymphocytes Relative: 39.7 % (ref 12.0–46.0)
Lymphs Abs: 2.1 10*3/uL (ref 0.7–4.0)
MCHC: 33.8 g/dL (ref 30.0–36.0)
MCV: 93.3 fl (ref 78.0–100.0)
Monocytes Absolute: 0.3 10*3/uL (ref 0.1–1.0)
Monocytes Relative: 5.5 % (ref 3.0–12.0)
Neutro Abs: 2.7 10*3/uL (ref 1.4–7.7)
Neutrophils Relative %: 50 % (ref 43.0–77.0)
Platelets: 212 10*3/uL (ref 150.0–400.0)
RBC: 4.71 Mil/uL (ref 3.87–5.11)
RDW: 13.7 % (ref 11.5–15.5)
WBC: 5.4 10*3/uL (ref 4.0–10.5)

## 2016-05-11 LAB — HEPATIC FUNCTION PANEL
ALT: 11 U/L (ref 0–35)
AST: 20 U/L (ref 0–37)
Albumin: 4.1 g/dL (ref 3.5–5.2)
Alkaline Phosphatase: 71 U/L (ref 39–117)
Bilirubin, Direct: 0.1 mg/dL (ref 0.0–0.3)
Total Bilirubin: 0.3 mg/dL (ref 0.2–1.2)
Total Protein: 6.6 g/dL (ref 6.0–8.3)

## 2016-05-11 LAB — BASIC METABOLIC PANEL
BUN: 12 mg/dL (ref 6–23)
CO2: 29 mEq/L (ref 19–32)
Calcium: 9.1 mg/dL (ref 8.4–10.5)
Chloride: 105 mEq/L (ref 96–112)
Creatinine, Ser: 0.64 mg/dL (ref 0.40–1.20)
GFR: 101.4 mL/min (ref >60.00)
Glucose, Bld: 102 mg/dL — ABNORMAL HIGH (ref 70–99)
Potassium: 3.8 mEq/L (ref 3.5–5.1)
Sodium: 138 mEq/L (ref 135–145)

## 2016-05-11 LAB — POC URINALSYSI DIPSTICK (AUTOMATED)
Bilirubin, UA: NEGATIVE
Glucose, UA: NEGATIVE
Ketones, UA: NEGATIVE
Nitrite, UA: NEGATIVE
Protein, UA: NEGATIVE
Spec Grav, UA: 1.015
Urobilinogen, UA: 0.2
pH, UA: 5

## 2016-05-11 LAB — LIPID PANEL
Cholesterol: 218 mg/dL — ABNORMAL HIGH (ref 0–200)
HDL: 74.4 mg/dL (ref >39.00)
LDL Cholesterol: 130 mg/dL — ABNORMAL HIGH (ref 0–99)
NonHDL: 143.53
Total CHOL/HDL Ratio: 3
Triglycerides: 70 mg/dL (ref 0.0–149.0)
VLDL: 14 mg/dL (ref 0.0–40.0)

## 2016-05-11 LAB — TSH: TSH: 2.06 u[IU]/mL (ref 0.35–4.50)

## 2016-05-11 NOTE — Telephone Encounter (Signed)
She already has a refill available until November

## 2016-05-11 NOTE — Telephone Encounter (Signed)
Call in #90 with one rf 

## 2016-05-11 NOTE — Telephone Encounter (Signed)
I spoke with [facility], they will fill script and notify pt when ready for pick.

## 2016-05-15 ENCOUNTER — Ambulatory Visit (INDEPENDENT_AMBULATORY_CARE_PROVIDER_SITE_OTHER): Payer: 59 | Admitting: Family Medicine

## 2016-05-15 ENCOUNTER — Encounter: Payer: Self-pay | Admitting: Family Medicine

## 2016-05-15 VITALS — BP 92/64 | HR 83 | Temp 98.6°F | Ht 64.5 in | Wt 117.4 lb

## 2016-05-15 DIAGNOSIS — Z0000 Encounter for general adult medical examination without abnormal findings: Secondary | ICD-10-CM

## 2016-05-15 DIAGNOSIS — E785 Hyperlipidemia, unspecified: Secondary | ICD-10-CM | POA: Diagnosis not present

## 2016-05-15 MED ORDER — ZOLMITRIPTAN 5 MG PO TABS: 5.0000 mg | ORAL_TABLET | ORAL | 3 refills | Status: DC | PRN

## 2016-05-15 MED ORDER — ROSUVASTATIN CALCIUM 10 MG PO TABS
10.0000 mg | ORAL_TABLET | Freq: Every day | ORAL | 3 refills | Status: DC
Start: 2016-05-15 — End: 2017-03-28

## 2016-05-15 NOTE — Progress Notes (Signed)
   Subjective:    Patient ID: Emily Ward, female    DOB: 12-Oct-1955, 60 y.o.   MRN: [id number]  HPI 60 yr old female for a well exam. She feels good and has no concerns. Her fibromyalgia is under much better control and she has stopped taking Lyrica completely. She sees [facility] for treatments and this has been a great benefit.    Review of Systems  Constitutional: Negative.   HENT: Negative.   Eyes: Negative.   Respiratory: Negative.   Cardiovascular: Negative.   Gastrointestinal: Negative.   Genitourinary: Negative for decreased urine volume, difficulty urinating, dyspareunia, dysuria, enuresis, flank pain, frequency, hematuria, pelvic pain and urgency.  Musculoskeletal: Negative.   Skin: Negative.   Neurological: Negative.   Psychiatric/Behavioral: Negative.        Objective:   Physical Exam  Constitutional: She is oriented to person, place, and time. She appears well-developed and well-nourished. No distress.  HENT:  Head: Normocephalic and atraumatic.  Right Ear: External ear normal.  Left Ear: External ear normal.  Nose: Nose normal.  Mouth/Throat: Oropharynx is clear and moist. No oropharyngeal exudate.  Eyes: Conjunctivae and EOM are normal. Pupils are equal, round, and reactive to light. No scleral icterus.  Neck: Normal range of motion. Neck supple. No JVD present. No thyromegaly present.  Cardiovascular: Normal rate, regular rhythm, normal heart sounds and intact distal pulses.  Exam reveals no gallop and no friction rub.   No murmur heard. EKG normal   Pulmonary/Chest: Effort normal and breath sounds normal. No respiratory distress. She has no wheezes. She has no rales. She exhibits no tenderness.  Abdominal: Soft. Bowel sounds are normal. She exhibits no distension and no mass. There is no tenderness. There is no rebound and no guarding.  Musculoskeletal: Normal range of motion. She exhibits no edema or tenderness.  Lymphadenopathy:    She has  no cervical adenopathy.  Neurological: She is alert and oriented to person, place, and time. She has normal reflexes. No cranial nerve deficit. She exhibits normal muscle tone. Coordination normal.  Skin: Skin is warm and dry. No rash noted. No erythema.  Psychiatric: She has a normal mood and affect. Her behavior is normal. Judgment and thought content normal.          Assessment & Plan:  Well exam. We discussed diet and exercise.  Laurey Morale, MD

## 2016-05-15 NOTE — Progress Notes (Signed)
Pre visit review using our clinic review tool, if applicable. No additional management support is needed unless otherwise documented below in the visit note. 

## 2016-05-19 ENCOUNTER — Other Ambulatory Visit: Payer: Self-pay | Admitting: Obstetrics & Gynecology

## 2016-05-21 NOTE — Telephone Encounter (Signed)
Patient returning call.

## 2016-05-21 NOTE — Telephone Encounter (Signed)
Spoke with patient regarding refill request and needing to have a updated mammogram. Patient is going to call today to schedule -eh

## 2016-05-21 NOTE — Telephone Encounter (Signed)
Medication refill request: Prempro Last AEX:  ? Last OV: 05-05-15 Next AEX: 08-30-16 Last MMG (if hormonal medication request): 05-28-14 WNL - left message for patient, reminding her she needs updated mammogram  Refill authorized: please advise

## 2016-05-25 ENCOUNTER — Other Ambulatory Visit: Payer: Self-pay | Admitting: Family Medicine

## 2016-05-25 DIAGNOSIS — Z1231 Encounter for screening mammogram for malignant neoplasm of breast: Secondary | ICD-10-CM

## 2016-06-01 ENCOUNTER — Ambulatory Visit
Admission: RE | Admit: 2016-06-01 | Discharge: 2016-06-01 | Disposition: A | Discharge status: Home / Self Care | Payer: 59 | Source: Ambulatory Visit | Attending: Family Medicine | Admitting: Family Medicine

## 2016-06-01 DIAGNOSIS — Z1231 Encounter for screening mammogram for malignant neoplasm of breast: Secondary | ICD-10-CM

## 2016-06-18 ENCOUNTER — Telehealth: Payer: Self-pay | Admitting: Obstetrics & Gynecology

## 2016-06-18 MED ORDER — CONJ ESTROG-MEDROXYPROGEST ACE 0.45-1.5 MG PO TABS: 1.0000 | ORAL_TABLET | Freq: Every day | ORAL | 2 refills | Status: DC

## 2016-06-18 NOTE — Telephone Encounter (Signed)
Ok to refill PremPro in current dosage for 3 months.  Please send to pharmacy of choice.   Cc- Dr. Sabra Heck

## 2016-06-18 NOTE — Telephone Encounter (Signed)
[facility] requesting refill for Prempro. [phone]

## 2016-06-18 NOTE — Telephone Encounter (Signed)
Med refill request: Prempro 0.45-1.5mg  tab daily Last AEX: OV PUS 05/05/15 -PMB Next AEX: 08/30/16 Last MMG (if hormonal med) 06/01/16 Refill authorized: Please Advise?     Cc: Dr.Miller

## 2016-06-18 NOTE — Telephone Encounter (Signed)
Order placed for Prempro 0.45-1.5 mg, 1 tab PO daily; #28/2RF, as requested by Dr. Quincy Simmonds as seen below.    Routing to provider for final review. Patient is agreeable to disposition. Will close encounter.    Cc: Dr. Sabra Heck

## 2016-06-26 ENCOUNTER — Ambulatory Visit: Payer: 59 | Admitting: Rheumatology

## 2016-06-30 DIAGNOSIS — N3010 Interstitial cystitis (chronic) without hematuria: Secondary | ICD-10-CM | POA: Insufficient documentation

## 2016-06-30 DIAGNOSIS — K589 Irritable bowel syndrome without diarrhea: Secondary | ICD-10-CM | POA: Insufficient documentation

## 2016-06-30 DIAGNOSIS — G4700 Insomnia, unspecified: Secondary | ICD-10-CM | POA: Insufficient documentation

## 2016-06-30 DIAGNOSIS — M47816 Spondylosis without myelopathy or radiculopathy, lumbar region: Secondary | ICD-10-CM | POA: Insufficient documentation

## 2016-06-30 DIAGNOSIS — R5383 Other fatigue: Secondary | ICD-10-CM | POA: Insufficient documentation

## 2016-06-30 DIAGNOSIS — M47812 Spondylosis without myelopathy or radiculopathy, cervical region: Secondary | ICD-10-CM | POA: Insufficient documentation

## 2016-06-30 DIAGNOSIS — M797 Fibromyalgia: Secondary | ICD-10-CM | POA: Insufficient documentation

## 2016-06-30 NOTE — Progress Notes (Signed)
*IMAGE* Office Visit Note  Patient: Emily Ward             Date of Birth: 06-Jul-1956           MRN: [id number]             PCP: Laurey Morale, MD Referring: Laurey Morale, MD Visit Date: 07/02/2016    Subjective:  Follow-up Fibromyalgia  History of Present Illness: MICHIELLE BOCHENEK is a 60 y.o. female who was last seen in our office on 12/26/2015. Patient has been seeing integrative therapy. She started in February. By April visit she was feeling really well. She has been compliant with her physical therapy at [facility], using the medication, avoiding lifestyle that promotes poor health  She rates her fibromyalgia discomfort as a 2 on a scale of 0-10. Many days that she is a 0 on a scale of 0-10. Patient states that she has never felt this good in the past.  Able to discontinue her Lyrica. She continues to rely on her Cymbalta. She states that she needs 90 mg. For now we will continue this dose through the winter and then patient plan to taper down to 60 mg at her next visit in 5-6 months  [facility] is doing alpha stimulation, massage, biofeedback, physical therapy. Each of this component has proven to be effective for the patient.  She recently saw her family physician Dr. Sarajane Jews who did a physical on her and had lots of labs done. I reviewed those labs and they are acceptable.  Activities of Daily Living:  Patient reports morning stiffness for 15 minutes.   Patient Denies nocturnal pain.  Difficulty dressing/grooming: Denies Difficulty climbing stairs: Denies Difficulty getting out of chair: Denies Difficulty using hands for taps, buttons, cutlery, and/or writing: Denies   Review of Systems  Constitutional: Positive for fatigue.  HENT: Negative for mouth sores and mouth dryness.   Eyes: Negative for dryness.  Respiratory: Negative for shortness of breath.   Gastrointestinal: Negative for constipation and diarrhea.  Musculoskeletal: Positive for myalgias and  myalgias.  Skin: Negative for sensitivity to sunlight.  Psychiatric/Behavioral: Positive for sleep disturbance. Negative for decreased concentration.    PMFS History:  Patient Active Problem List   Diagnosis Date Noted  . Fibromyalgia 06/30/2016  . Other fatigue 06/30/2016  . Insomnia 06/30/2016  . DJD (degenerative joint disease), cervical 06/30/2016  . 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 IBS (irritable bowel syndrome) 06/30/2016  . Interstitial cystitis 06/30/2016  . Left-sided chest wall pain 11/25/2014  . SHOULDER PAIN, RIGHT 09/05/2010  . TOBACCO USER 06/27/2010  . GERD 03/24/2009  . SACROILIAC STRAIN 03/24/2009  . CHEST PAIN 01/20/2008  . HEMATURIA UNSPECIFIED 10/14/2007  . FIBROMYALGIA 07/03/2007  . HYPERLIPIDEMIA NEC/NOS 05/16/2007  . HEADACHE 05/09/2007    Past Medical History:  Diagnosis Date  . Cervical disc disease   . 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    . Fibromyalgia    sees Dr. Estanislado Pandy   . Heart murmur   . 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 Hyperlipidemia   . Migraines   . MVP (mitral valve prolapse)    no daily medication  . Peptic ulcer    no problems    Family History  Problem Relation Age of Onset  . Breast cancer Mother     lumpectomy in late 50/60  . Heart attack Father   .  Congestive Heart Failure Father   . Cancer      breast/fhx  . Heart disease      fhx  . Cirrhosis Sister   . Cirrhosis Brother   . Diabetes type II Sister     and ? brother  . Arthritis Son     psoriatic  . Colon cancer Neg Hx   . Esophageal cancer Neg Hx   . Rectal cancer Neg Hx   . Stomach cancer Neg Hx    Past Surgical History:  Procedure Laterality Date  . CERVICAL LAMINECTOMY    . CESAREAN SECTION  1993  . CHOLECYSTECTOMY  06/2002  . COLONOSCOPY  06-06-12   per Dr. Olevia Perches, hyperplastic polyps, repeat in 10 yrs   . HYSTEROSCOPY     years ago with Dr  Radene Knee-? why was done   Social History   Social History Narrative  . No narrative on file     Objective: Vital Signs: BP 94/70 (BP Location: Left Arm, Patient Position: Sitting, Cuff Size: Large)   Pulse 92   Resp 12   Ht 5' 4.5" (1.638 m)   Wt 117 lb (53.1 kg)   BMI 19.77 kg/m     Physical Exam  Constitutional: She is oriented to person, place, and time. She appears well-developed and well-nourished.  HENT:  Head: Normocephalic and atraumatic.  Eyes: EOM are normal. Pupils are equal, round, and reactive to light.  Cardiovascular: Normal rate, regular rhythm and normal heart sounds.  Exam reveals no gallop and no friction rub.   No murmur heard. Pulmonary/Chest: Effort normal and breath sounds normal. She has no wheezes. She has no rales.  Abdominal: Soft. Bowel sounds are normal. She exhibits no distension. There is no tenderness. There is no guarding. No hernia.  Musculoskeletal: Normal range of motion. She exhibits no edema, tenderness or deformity.  Lymphadenopathy:    She has no cervical adenopathy.  Neurological: She is alert and oriented to person, place, and time. Coordination normal.  Skin: Skin is warm and dry. Capillary refill takes less than 2 seconds. No rash noted.  Psychiatric: She has a normal mood and affect. Her behavior is normal.  Nursing note and vitals reviewed.    Musculoskeletal Exam:   Full range of motion of all joints line  grip strength is equal and strong bilaterally Active disease with generalized pain and fibromyalgia tender points are 2 out of 18 positive bilateral trapezius muscles No other areas hurt.  CDAI Exam: CDAI Homunculus Exam:   Joint Counts:  CDAI Tender Joint count: 0 CDAI Swollen Joint count: 0  Global Assessments:  Patient Global Assessment: 2 Provider Global Assessment: 2  CDAI Calculated Score: 4    Investigation: Findings:  Had physical done through Dr. Barbie Banner office. Please see his notes for full details and  the labs done on that visit.    Imaging: No results found.  Speciality Comments: No specialty comments available.    Procedures:  No procedures performed Allergies: Divalproex sodium and Nsaids   Assessment / Plan: Visit Diagnoses: Fibromyalgia  Other fatigue  Insomnia, unspecified type  DJD (degenerative joint disease), cervical  Spondylosis of lumbar region without myelopathy or radiculopathy  Irritable bowel syndrome, unspecified type  Interstitial cystitis  Patient is doing well with her fibromyalgia. She rates her discomfort as a 2 on a scale of 0-10 most days. Few of those days she has no pain whatsoever. Contributes a lot of her improvement to her integrative therapy visits.  She continues  to take Cymbalta on a regular basis at 90 mg. She desires to go down to 60 mg she will do that on next visit. She fears that the winter months may cause her to have a flare. She continues to take Flexeril on a when necessary basis. She gets Ambien through her PCP. We cautioned her on using Chloroxone (muscle relaxer) with other muscle relaxer  Patient was able to stop her Lyrica since she is doing so well.  I advised patient to continue water aerobics intake and her good results that she will obtained from integrative therapy. Patient states that she will try. Orders: No orders of the defined types were placed in this encounter.  Meds ordered this encounter  Medications  . DISCONTD: Pregabalin (LYRICA PO)    Sig: Take by mouth.  . DISCONTD: Elastic Bandages & Supports (WRIST BRACE/RIGHT SMALL) MISC    Sig: by Does not apply route. RIGHT [facility] BRACE ORDERED December 26, 2015 VISIT PRN  . DULoxetine (CYMBALTA) 30 MG capsule    Sig: Take 3 capsules every morning    Dispense:  90 capsule    Refill:  5  . cyclobenzaprine (FLEXERIL) 10 MG tablet    Sig: Take 1 tablet (10 mg total) by mouth at bedtime. for muscle spams    Dispense:  30 tablet    Refill:  5    Pt will need office  visit for future refills    Face-to-face time spent with patient was 30 minutes. 50% of time was spent in counseling and coordination of care.  Follow-Up Instructions: Return in about 6 months (around 12/31/2016) for Fibromyalga, ?see if OK to Decrease cymbalt to 25m at next visit.

## 2016-07-02 ENCOUNTER — Encounter: Payer: Self-pay | Admitting: Rheumatology

## 2016-07-02 ENCOUNTER — Ambulatory Visit (INDEPENDENT_AMBULATORY_CARE_PROVIDER_SITE_OTHER): Payer: 59 | Admitting: Rheumatology

## 2016-07-02 VITALS — BP 94/70 | HR 92 | Resp 12 | Ht 64.5 in | Wt 117.0 lb

## 2016-07-02 DIAGNOSIS — N3010 Interstitial cystitis (chronic) without hematuria: Secondary | ICD-10-CM | POA: Diagnosis not present

## 2016-07-02 DIAGNOSIS — M47812 Spondylosis without myelopathy or radiculopathy, cervical region: Secondary | ICD-10-CM

## 2016-07-02 DIAGNOSIS — K589 Irritable bowel syndrome without diarrhea: Secondary | ICD-10-CM | POA: Diagnosis not present

## 2016-07-02 DIAGNOSIS — M5030 Other cervical disc degeneration, unspecified cervical region: Secondary | ICD-10-CM

## 2016-07-02 DIAGNOSIS — G4700 Insomnia, unspecified: Secondary | ICD-10-CM

## 2016-07-02 DIAGNOSIS — R5383 Other fatigue: Secondary | ICD-10-CM | POA: Diagnosis not present

## 2016-07-02 DIAGNOSIS — M47816 Spondylosis without myelopathy or radiculopathy, lumbar region: Secondary | ICD-10-CM | POA: Diagnosis not present

## 2016-07-02 DIAGNOSIS — M797 Fibromyalgia: Secondary | ICD-10-CM

## 2016-07-02 MED ORDER — CYCLOBENZAPRINE HCL 10 MG PO TABS: 10.0000 mg | ORAL_TABLET | Freq: Every day | ORAL | 5 refills | Status: DC

## 2016-07-02 MED ORDER — DULOXETINE HCL 30 MG PO CPEP: ORAL_CAPSULE | ORAL | 5 refills | Status: DC

## 2016-07-06 ENCOUNTER — Ambulatory Visit (INDEPENDENT_AMBULATORY_CARE_PROVIDER_SITE_OTHER): Payer: 59 | Admitting: Adult Health

## 2016-07-06 ENCOUNTER — Telehealth: Payer: Self-pay | Admitting: Adult Health

## 2016-07-06 ENCOUNTER — Ambulatory Visit (INDEPENDENT_AMBULATORY_CARE_PROVIDER_SITE_OTHER)
Admission: RE | Admit: 2016-07-06 | Discharge: 2016-07-06 | Disposition: A | Discharge status: Home / Self Care | Payer: 59 | Source: Ambulatory Visit | Attending: Adult Health | Admitting: Adult Health

## 2016-07-06 ENCOUNTER — Encounter: Payer: Self-pay | Admitting: Adult Health

## 2016-07-06 VITALS — BP 112/70 | Ht 64.5 in | Wt 113.2 lb

## 2016-07-06 DIAGNOSIS — M5412 Radiculopathy, cervical region: Secondary | ICD-10-CM

## 2016-07-06 MED ORDER — METHYLPREDNISOLONE 4 MG PO TBPK: ORAL_TABLET | ORAL | 0 refills | Status: DC

## 2016-07-06 NOTE — Telephone Encounter (Signed)
Updated Nature on her x ray results. She would like to try a low dose of prednisone to see if it helps with the pain.   I will send in a dose pack

## 2016-07-06 NOTE — Progress Notes (Signed)
Subjective:    Patient ID: Emily Ward, female    DOB: Mar 04, 1956, 60 y.o.   MRN: [id number]  HPI  60 year old female, patient of Dr. Sarajane Jews who I am seeing today for the first time. She reports that over the last 3 -4 days she had started noticing left sided neck pain. Today while at work she looked up towards the ceiling and felt a " sharp sudden pain".   Currently she reports that the pain feels as a " pulling aching feeling". The pain starts at the base of her neck and radiates down her left arm. She does have numbness and tingling in her left pinkie and ring finger. No decrease in grip strength.   She has used a muscle relaxer, bio freeze and ice today without relief.   She has a history of herniated disk roughly 10 years ago and she endorses that it " kind of feels like that".     Review of Systems  Constitutional: Positive for activity change.  Respiratory: Negative.   Cardiovascular: Negative.   Musculoskeletal: Positive for back pain, neck pain and neck stiffness. Negative for joint swelling.  Skin: Negative.   Neurological: Positive for numbness. Negative for dizziness, weakness, light-headedness and headaches.  All other systems reviewed and are negative.  Past Medical History:  Diagnosis Date  . Cervical disc disease   . 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    . Fibromyalgia    sees Dr. Estanislado Pandy   . Heart murmur   . 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 Hyperlipidemia   . Migraines   . MVP (mitral valve prolapse)    no daily medication  . Peptic ulcer    no problems    Social History   Social History  . Marital status: Married    Spouse name: N/A  . Number of children: N/A  . Years of education: N/A   Occupational History  . Not on file.   Social History Main Topics  . Smoking status: Former Smoker    Packs/day: 0.50    Years: 20.00    Types: Cigarettes  . Smokeless tobacco: Never Used  .  Alcohol use 0.0 oz/week     Comment: occ  . Drug use: No  . Sexual activity: Yes    Partners: Male   Other Topics Concern  . Not on file   Social History Narrative  . No narrative on file    Past Surgical History:  Procedure Laterality Date  . CERVICAL LAMINECTOMY    . CESAREAN SECTION  1993  . CHOLECYSTECTOMY  06/2002  . COLONOSCOPY  06-06-12   per Dr. Olevia Perches, hyperplastic polyps, repeat in 10 yrs   . HYSTEROSCOPY     years ago with Dr Radene Knee-? why was done    Family History  Problem Relation Age of Onset  . Breast cancer Mother     lumpectomy in late 50/60  . Heart attack Father   . Congestive Heart Failure Father   . Cancer      breast/fhx  . Heart disease      fhx  . Cirrhosis Sister   . Cirrhosis Brother   . Diabetes type II Sister     and ? brother  . Arthritis Son     psoriatic  . Colon cancer Neg Hx   . Esophageal cancer Neg Hx   . Rectal cancer Neg Hx   .  Stomach cancer Neg Hx     Allergies  Allergen Reactions  . Divalproex Sodium     Drug induced hepatitis  . Nsaids     Upset stomach    Current Outpatient Prescriptions on File Prior to Visit  Medication Sig Dispense Refill  . aspirin 81 MG tablet Take 81 mg by mouth daily.      . butalbital-acetaminophen-caffeine (FIORICET, ESGIC) 50-325-40 MG tablet TAKE 1 TABLET BY MOUTH EVERY 6 HOURS AS NEEDED. 120 tablet 5  . cetirizine (ZYRTEC) 10 MG tablet Take 10 mg by mouth as needed.     . chlorzoxazone (PARAFON) 500 MG tablet TAKE 1 TABLET BY MOUTH FOUR TIMES DAILY AS NEEDED FOR MUSCLE SPASMS 120 tablet 5  . cyclobenzaprine (FLEXERIL) 10 MG tablet Take 1 tablet (10 mg total) by mouth at bedtime. for muscle spams 30 tablet 5  . DULoxetine (CYMBALTA) 30 MG capsule Take 3 capsules every morning 90 capsule 5  . estrogen, conjugated,-medroxyprogesterone (PREMPRO) 0.45-1.5 MG tablet Take 1 tablet by mouth daily. 28 tablet 2  . HYDROcodone-acetaminophen (NORCO) 10-325 MG tablet Take 1 tablet by mouth every 6  (six) hours as needed for severe pain. 120 tablet 0  . LOVAZA 1 G capsule TAKE 1 CAPSULE TWICE A DAY 180 capsule 3  . rosuvastatin (CRESTOR) 10 MG tablet Take 1 tablet (10 mg total) by mouth daily. 90 tablet 3  . zolmitriptan (ZOMIG) 5 MG tablet Take 1 tablet (5 mg total) by mouth as needed. 30 tablet 3  . zolpidem (AMBIEN) 10 MG tablet TAKE 1 TABLET BY MOUTH AT BEDTIME AS NEEDED FOR SLEEP. 90 tablet 1   No current facility-administered medications on file prior to visit.     BP 112/70   Ht 5' 4.5" (1.638 m)   Wt 113 lb 3.2 oz (51.3 kg)   BMI 19.13 kg/m       Objective:   Physical Exam  Constitutional: She is oriented to person, place, and time. She appears well-developed and well-nourished. No distress.  Neck: Neck supple.  Limited ROM with horizontal and vertical movement of neck   Cardiovascular: Normal rate, regular rhythm, normal heart sounds and intact distal pulses.  Exam reveals no gallop and no friction rub.   No murmur heard. Musculoskeletal: She exhibits no edema, tenderness or deformity.  No tenderness with palpation to left trapezoid. Slight tenderness to cervical spine (C5-C6). No bruising or trauma noted.    Neurological: She is alert and oriented to person, place, and time.  Skin: Skin is warm and dry. No rash noted. She is not diaphoretic. No erythema. No pallor.  Psychiatric: She has a normal mood and affect. Her behavior is normal. Judgment and thought content normal.  Nursing note and vitals reviewed.     Assessment & Plan:  1. Cervical radiculitis - appears as more cervical radiculitis rather than herniated disk.  - she has a prescription from PCP for Norco.  - She did not want to try a different muscle relaxer or prednisone at this time.  - DG Cervical Spine Complete; Future - Advised to follow up with PCP on Monday if no improvement or go to the ER over the weekend if symptoms become worse - Can use a warm compress  Dorothyann Peng, NP

## 2016-08-06 ENCOUNTER — Telehealth: Payer: Self-pay | Admitting: Obstetrics & Gynecology

## 2016-08-06 NOTE — Telephone Encounter (Signed)
DR/CX/RS/RD SURGERY CONFLICT/RD

## 2016-08-12 ENCOUNTER — Other Ambulatory Visit: Payer: Self-pay | Admitting: Family Medicine

## 2016-08-14 NOTE — Telephone Encounter (Signed)
Denied.  Filled on 05/11/16 for 6 months.  Request is early.

## 2016-08-15 ENCOUNTER — Telehealth: Payer: Self-pay | Admitting: Family Medicine

## 2016-08-15 NOTE — Telephone Encounter (Signed)
° ° °  Pt request refill of the following:  zolpidem (AMBIEN) 10 MG tablet   Per Misty this request was denied said pt had 6 refills. Per pt she never receives 6 refills. Looking at the system pt received a refill in Sept with 1 refill.    Phamacy: [facility] [address]

## 2016-08-17 MED ORDER — ZOLPIDEM TARTRATE 10 MG PO TABS: 10.0000 mg | ORAL_TABLET | Freq: Every evening | ORAL | 1 refills | Status: DC | PRN

## 2016-08-17 NOTE — Telephone Encounter (Signed)
I spoke with pharmacy and pt has refill left at [facility]. I left pt a voice message with this information.

## 2016-08-17 NOTE — Telephone Encounter (Signed)
Can we cancel and send to new pharmacy?

## 2016-08-17 NOTE — Telephone Encounter (Signed)
Yes cancel and call in a new rx for #30 with 5 rf

## 2016-08-17 NOTE — Telephone Encounter (Signed)
[facility] [address] and [address] is close

## 2016-08-17 NOTE — Telephone Encounter (Signed)
I called in script to new pharmacy, did cancel remaining refills at previous pharmacy.

## 2016-08-30 ENCOUNTER — Ambulatory Visit: Payer: 59 | Admitting: Obstetrics & Gynecology

## 2016-08-31 ENCOUNTER — Other Ambulatory Visit: Payer: Self-pay | Admitting: Family Medicine

## 2016-09-04 ENCOUNTER — Ambulatory Visit (INDEPENDENT_AMBULATORY_CARE_PROVIDER_SITE_OTHER): Payer: 59 | Admitting: Family Medicine

## 2016-09-04 ENCOUNTER — Encounter: Payer: Self-pay | Admitting: Family Medicine

## 2016-09-04 VITALS — BP 103/88 | HR 113 | Temp 100.0°F | Ht 64.5 in | Wt 116.0 lb

## 2016-09-04 DIAGNOSIS — J019 Acute sinusitis, unspecified: Secondary | ICD-10-CM

## 2016-09-04 MED ORDER — AZITHROMYCIN 250 MG PO TABS: ORAL_TABLET | ORAL | 0 refills | Status: DC

## 2016-09-04 NOTE — Progress Notes (Signed)
Pre visit review using our clinic review tool, if applicable. No additional management support is needed unless otherwise documented below in the visit note. 

## 2016-09-04 NOTE — Progress Notes (Signed)
   Subjective:    Patient ID: Emily Ward, female    DOB: [birth year], 61 y.o.   MRN: [id number]  HPI Here for one week of sinus pressure, PND, and a dry cough. No fever.    Review of Systems  Constitutional: Negative.   HENT: Positive for congestion, postnasal drip, sinus pain, sinus pressure and sore throat.   Eyes: Negative.   Respiratory: Positive for cough.        Objective:   Physical Exam  Constitutional: She appears well-developed and well-nourished.  HENT:  Right Ear: External ear normal.  Left Ear: External ear normal.  Nose: Nose normal.  Mouth/Throat: Oropharynx is clear and moist.  Eyes: Conjunctivae are normal.  Neck: Neck supple. No thyromegaly present.  Pulmonary/Chest: Effort normal and breath sounds normal.  Lymphadenopathy:    She has no cervical adenopathy.          Assessment & Plan:  Sinusitis, treat with a Zpack.  Alysia Penna, MD

## 2016-09-07 ENCOUNTER — Other Ambulatory Visit: Payer: Self-pay | Admitting: Obstetrics and Gynecology

## 2016-09-07 NOTE — Telephone Encounter (Signed)
Medication refill request: Prempro Last AEX:  05/17/14-AEX; 05/05/15 (PMB & Pap) SM Next AEX: 09/25/16 SM Last MMG (if hormonal medication request): 06/01/16 BIRADS1, Density B, [facility] Refill authorized: 06/18/16 #28 2R. Please advise. Thank you.

## 2016-09-12 DIAGNOSIS — M79671 Pain in right foot: Secondary | ICD-10-CM | POA: Diagnosis not present

## 2016-09-12 DIAGNOSIS — M797 Fibromyalgia: Secondary | ICD-10-CM | POA: Diagnosis not present

## 2016-09-12 DIAGNOSIS — M79641 Pain in right hand: Secondary | ICD-10-CM | POA: Diagnosis not present

## 2016-09-12 DIAGNOSIS — M542 Cervicalgia: Secondary | ICD-10-CM | POA: Diagnosis not present

## 2016-09-12 DIAGNOSIS — M545 Low back pain: Secondary | ICD-10-CM | POA: Diagnosis not present

## 2016-09-12 DIAGNOSIS — M79672 Pain in left foot: Secondary | ICD-10-CM | POA: Diagnosis not present

## 2016-09-24 NOTE — Progress Notes (Signed)
61 y.o. G2P2 MarriedCaucasianF here for annual exam.  Doing well.  No vaginal bleeding since lowering dosage of HRT.  Has done well on this dosage.    No LMP recorded. Patient is postmenopausal.          Sexually active: Yes.    The current method of family planning is post menopausal status.    Exercising: Yes.    walking, yoga Smoker:  Former smoker  Health Maintenance: Pap:  05/05/15 negative, neg HR HPV 2015 History of abnormal Pap:  no MMG:  06/05/16 BIRADS 1 negative  Colonoscopy:  06/06/12 polyp- repeat 5 years  BMD: 2014    TDaP:  05/05/09  Pneumonia vaccine(s):  never Zostavax:   never Hep C testing: draw at end of appointment Screening Labs: PCP, Hb today: PCP, Urine today: PCP   reports that she has quit smoking. Her smoking use included Cigarettes. She has a 10.00 pack-year smoking history. She has never used smokeless tobacco. She reports that she drinks alcohol. She reports that she does not use drugs.  Past Medical History:  Diagnosis Date  . Cervical disc disease   . 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    . Fibromyalgia    sees Dr. Estanislado Pandy   . Heart murmur   . 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 Hyperlipidemia   . Migraines   . MVP (mitral valve prolapse)    no daily medication  . Peptic ulcer    no problems    Past Surgical History:  Procedure Laterality Date  . CERVICAL LAMINECTOMY    . CESAREAN SECTION  1993  . CHOLECYSTECTOMY  06/2002  . COLONOSCOPY  06-06-12   per Dr. Olevia Perches, hyperplastic polyps, repeat in 10 yrs   . HYSTEROSCOPY     years ago with Dr Radene Knee-? why was done    Current Outpatient Prescriptions  Medication Sig Dispense Refill  . aspirin 81 MG tablet Take 81 mg by mouth daily.      . butalbital-acetaminophen-caffeine (FIORICET, ESGIC) 50-325-40 MG tablet TAKE 1 TABLET BY MOUTH EVERY 6 HOURS AS NEEDED. 120 tablet 5  . cetirizine (ZYRTEC) 10 MG tablet Take 10 mg by mouth as needed.      . chlorzoxazone (PARAFON) 500 MG tablet TAKE 1 TABLET BY MOUTH FOUR TIMES DAILY AS NEEDED FOR MUSCLE SPASMS 120 tablet 5  . cyclobenzaprine (FLEXERIL) 10 MG tablet Take 1 tablet (10 mg total) by mouth at bedtime. for muscle spams 30 tablet 5  . DULoxetine (CYMBALTA) 30 MG capsule Take 3 capsules every morning 90 capsule 5  . HYDROcodone-acetaminophen (NORCO) 10-325 MG tablet Take 1 tablet by mouth every 6 (six) hours as needed for severe pain. 120 tablet 0  . LOVAZA 1 G capsule TAKE 1 CAPSULE TWICE A DAY 180 capsule 3  . PREMPRO 0.45-1.5 MG tablet TAKE 1 TABLET BY MOUTH DAILY 28 tablet 0  . rosuvastatin (CRESTOR) 10 MG tablet Take 1 tablet (10 mg total) by mouth daily. 90 tablet 3  . zolmitriptan (ZOMIG) 5 MG tablet Take 1 tablet (5 mg total) by mouth as needed. 30 tablet 3  . zolpidem (AMBIEN) 10 MG tablet Take 1 tablet (10 mg total) by mouth at bedtime as needed. for sleep 90 tablet 1   No current facility-administered medications for this visit.     Family History  Problem Relation Age of Onset  . Breast cancer Mother     lumpectomy  in late 50/60  . Heart attack Father   . Congestive Heart Failure Father   . Cancer      breast/fhx  . Heart disease      fhx  . Cirrhosis Sister   . Cirrhosis Brother   . Diabetes type II Sister     and ? brother  . Arthritis Son     psoriatic  . Colon cancer Neg Hx   . Esophageal cancer Neg Hx   . Rectal cancer Neg Hx   . Stomach cancer Neg Hx     ROS:  Pertinent items are noted in HPI.  Otherwise, a comprehensive ROS was negative.  Exam:   BP 100/64 (BP Location: Right Arm, Patient Position: Sitting, Cuff Size: Normal)   Pulse 76   Resp 14   Ht 5' 4.75" (1.645 m)   Wt 115 lb 6.4 oz (52.3 kg)   BMI 19.35 kg/m   Weight change: no change   Height: 5' 4.75" (164.5 cm)  Ht Readings from Last 3 Encounters:  09/25/16 5' 4.75" (1.645 m)  09/04/16 5' 4.5" (1.638 m)  07/06/16 5' 4.5" (1.638 m)    General appearance: alert, cooperative and  appears stated age Head: Normocephalic, without obvious abnormality, atraumatic Neck: no adenopathy, supple, symmetrical, trachea midline and thyroid normal to inspection and palpation Lungs: clear to auscultation bilaterally Breasts: normal appearance, no masses or tenderness Heart: regular rate and rhythm Abdomen: soft, non-tender; bowel sounds normal; no masses,  no organomegaly Extremities: extremities normal, atraumatic, no cyanosis or edema Skin: Skin color, texture, turgor normal. No rashes or lesions Lymph nodes: Cervical, supraclavicular, and axillary nodes normal. No abnormal inguinal nodes palpated Neurologic: Grossly normal   Pelvic: External genitalia:  no lesions              Urethra:  normal appearing urethra with no masses, tenderness or lesions              Bartholins and Skenes: normal                 Vagina: normal appearing vagina with normal color and discharge, no lesions              Cervix: no lesions              Pap taken: No. Bimanual Exam:  Uterus:  normal size, contour, position, consistency, mobility, non-tender              Adnexa: normal adnexa and no mass, fullness, tenderness               Rectovaginal: Confirms               Anus:  normal sphincter tone, no lesions  Chaperone was present for exam.  A:  Well Woman with normal exam PMP, on HRT H/O elevated lipids H/O Depakote inducted hepatitis Fibromyalgia  P:   Mammogram guidelines reviewed pap smear ng 2017, neg HR HVP with ASCUS pap 2015, no pap obtained today Will switch HRT to premarin 0.45mg  daily.  #30/13RF and Provera 2.5mg  daily.  #90/4RF. Hep C and Vit D obtained today return annually or prn

## 2016-09-25 ENCOUNTER — Ambulatory Visit (INDEPENDENT_AMBULATORY_CARE_PROVIDER_SITE_OTHER): Payer: 59 | Admitting: Obstetrics & Gynecology

## 2016-09-25 ENCOUNTER — Encounter: Payer: Self-pay | Admitting: Obstetrics & Gynecology

## 2016-09-25 VITALS — BP 100/64 | HR 76 | Resp 14 | Ht 64.75 in | Wt 115.4 lb

## 2016-09-25 DIAGNOSIS — Z205 Contact with and (suspected) exposure to viral hepatitis: Secondary | ICD-10-CM

## 2016-09-25 DIAGNOSIS — Z0000 Encounter for general adult medical examination without abnormal findings: Secondary | ICD-10-CM

## 2016-09-25 DIAGNOSIS — Z01419 Encounter for gynecological examination (general) (routine) without abnormal findings: Secondary | ICD-10-CM | POA: Diagnosis not present

## 2016-09-25 LAB — HEPATITIS C ANTIBODY: HCV Ab: NEGATIVE

## 2016-09-25 MED ORDER — ESTROGENS CONJUGATED 0.45 MG PO TABS: 0.4500 mg | ORAL_TABLET | Freq: Every day | ORAL | 13 refills | Status: DC

## 2016-09-25 MED ORDER — MEDROXYPROGESTERONE ACETATE 2.5 MG PO TABS: 2.5000 mg | ORAL_TABLET | Freq: Every day | ORAL | 4 refills | Status: DC

## 2016-09-25 MED ORDER — CONJ ESTROG-MEDROXYPROGEST ACE 0.45-1.5 MG PO TABS: 1.0000 | ORAL_TABLET | Freq: Every day | ORAL | 0 refills | Status: DC

## 2016-09-26 LAB — VITAMIN D 25 HYDROXY (VIT D DEFICIENCY, FRACTURES): Vit D, 25-Hydroxy: 55 ng/mL (ref 30–100)

## 2016-09-27 ENCOUNTER — Other Ambulatory Visit: Payer: Self-pay | Admitting: Family Medicine

## 2016-09-27 NOTE — Telephone Encounter (Signed)
Call in #120 with 5 rf 

## 2016-09-27 NOTE — Telephone Encounter (Signed)
Can we refill this? 

## 2016-09-28 DIAGNOSIS — M797 Fibromyalgia: Secondary | ICD-10-CM | POA: Diagnosis not present

## 2016-09-28 DIAGNOSIS — M542 Cervicalgia: Secondary | ICD-10-CM | POA: Diagnosis not present

## 2016-09-28 DIAGNOSIS — M545 Low back pain: Secondary | ICD-10-CM | POA: Diagnosis not present

## 2016-10-01 ENCOUNTER — Telehealth: Payer: Self-pay | Admitting: Obstetrics & Gynecology

## 2016-10-01 NOTE — Telephone Encounter (Signed)
Patient states Premarin discount card did not work at the pharmacy.  Pharmacist states it is one of there old cards.

## 2016-10-01 NOTE — Telephone Encounter (Signed)
Spoke with patient. Patient states she was given premarin tablet discount card at last AEX 09/25/16. Patient states pharmacy advised her card was old even though expiration date is 10/03/17. Patient states pharmacy contacted company directly and will honor discount card for $15. Patient states she is going to f/u with [facility] to see if they are going to honor the discount for 12 months. Patient states she thought our office should be updated. Thanked patient for update. Advised patient look into current Premarin tablet discount cards in office. Patient states she plans to return call if pharmacy does not honor 12 month discounted cost.   Routing to provider for final review. Patient is agreeable to disposition. Will close encounter.  Cc: Lamont Snowball

## 2016-10-05 ENCOUNTER — Other Ambulatory Visit: Payer: Self-pay | Admitting: Rheumatology

## 2016-10-05 DIAGNOSIS — M542 Cervicalgia: Secondary | ICD-10-CM | POA: Diagnosis not present

## 2016-10-05 DIAGNOSIS — M797 Fibromyalgia: Secondary | ICD-10-CM | POA: Diagnosis not present

## 2016-10-05 DIAGNOSIS — M545 Low back pain: Secondary | ICD-10-CM | POA: Diagnosis not present

## 2016-10-08 NOTE — Telephone Encounter (Signed)
Last Visit: 07/02/16 Next Visit: 01/03/17  Okay to refill Lovaza?

## 2016-10-11 ENCOUNTER — Ambulatory Visit (INDEPENDENT_AMBULATORY_CARE_PROVIDER_SITE_OTHER): Payer: 59 | Admitting: Family Medicine

## 2016-10-11 ENCOUNTER — Encounter: Payer: Self-pay | Admitting: Family Medicine

## 2016-10-11 VITALS — BP 110/73 | HR 75 | Temp 98.3°F | Ht 64.75 in | Wt 115.0 lb

## 2016-10-11 DIAGNOSIS — J209 Acute bronchitis, unspecified: Secondary | ICD-10-CM | POA: Diagnosis not present

## 2016-10-11 MED ORDER — AZITHROMYCIN 250 MG PO TABS: ORAL_TABLET | ORAL | 0 refills | Status: DC

## 2016-10-11 NOTE — Progress Notes (Signed)
   Subjective:    Patient ID: Emily Ward, female    DOB: [birth year], 61 y.o.   MRN: [id number]  HPI Here for one week of URI symptoms that started with fever, body aches, and a headache. These symptoms have resolved but she has now developed chest congestion and a dry cough. On Mucinex.    Review of Systems  Constitutional: Negative.   HENT: Negative.   Eyes: Negative.   Respiratory: Positive for cough and chest tightness. Negative for shortness of breath and wheezing.        Objective:   Physical Exam  Constitutional: She appears well-developed and well-nourished.  HENT:  Right Ear: External ear normal.  Left Ear: External ear normal.  Nose: Nose normal.  Mouth/Throat: Oropharynx is clear and moist.  Eyes: Conjunctivae are normal.  Neck: No thyromegaly present.  Pulmonary/Chest: Effort normal. She has no wheezes. She has no rales.  Scattered rhonchi  Lymphadenopathy:    She has no cervical adenopathy.          Assessment & Plan:  Bronchitis, treat with a Zpack.  Alysia Penna, MD

## 2016-10-11 NOTE — Progress Notes (Signed)
Pre visit review using our clinic review tool, if applicable. No additional management support is needed unless otherwise documented below in the visit note. 

## 2016-11-02 DIAGNOSIS — M542 Cervicalgia: Secondary | ICD-10-CM | POA: Diagnosis not present

## 2016-11-02 DIAGNOSIS — M545 Low back pain: Secondary | ICD-10-CM | POA: Diagnosis not present

## 2016-11-02 DIAGNOSIS — M797 Fibromyalgia: Secondary | ICD-10-CM | POA: Diagnosis not present

## 2016-11-16 DIAGNOSIS — M797 Fibromyalgia: Secondary | ICD-10-CM | POA: Diagnosis not present

## 2016-11-16 DIAGNOSIS — M542 Cervicalgia: Secondary | ICD-10-CM | POA: Diagnosis not present

## 2016-11-16 DIAGNOSIS — M545 Low back pain: Secondary | ICD-10-CM | POA: Diagnosis not present

## 2016-11-30 DIAGNOSIS — M542 Cervicalgia: Secondary | ICD-10-CM | POA: Diagnosis not present

## 2016-11-30 DIAGNOSIS — M797 Fibromyalgia: Secondary | ICD-10-CM | POA: Diagnosis not present

## 2016-11-30 DIAGNOSIS — M545 Low back pain: Secondary | ICD-10-CM | POA: Diagnosis not present

## 2016-12-03 ENCOUNTER — Other Ambulatory Visit: Payer: Self-pay | Admitting: Rheumatology

## 2016-12-04 NOTE — Telephone Encounter (Signed)
Last Visit: 07/02/16 Next Visit: 01/03/17  Okay to refill Cymbalta?

## 2016-12-06 DIAGNOSIS — M797 Fibromyalgia: Secondary | ICD-10-CM | POA: Diagnosis not present

## 2016-12-06 DIAGNOSIS — M545 Low back pain: Secondary | ICD-10-CM | POA: Diagnosis not present

## 2016-12-06 DIAGNOSIS — M542 Cervicalgia: Secondary | ICD-10-CM | POA: Diagnosis not present

## 2016-12-30 NOTE — Progress Notes (Signed)
Office Visit Note  Patient: Emily Ward             Date of Birth: 08/30/1956           MRN: [id number]             PCP: Alysia Penna, MD Referring: Laurey Morale, MD Visit Date: 01/03/2017 Occupation: @GUAROCC @    Subjective:  Neck pain   History of Present Illness: Emily Ward is a 61 y.o. female with a history of fibromyalgia and DJD cervical.  Patient states she has been attending integrative therapy, which has improved her pain, insomnia, and fatigue.  She states it has caused her to discontinue her Lyrica.  She would like to decrease her cymbalta dose. Her trigger point point some much better now. She has minimal discomfort in the trapezius area.     cymbalta 90 wants to go to 60   Activities of Daily Living:  Patient reports morning stiffness for 5 minutes.   Patient Denies nocturnal pain.  Difficulty dressing/grooming: Denies Difficulty climbing stairs: Denies Difficulty getting out of chair: Denies Difficulty using hands for taps, buttons, cutlery, and/or writing: Denies   Review of Systems  Constitutional: Negative for fatigue, weight gain, weight loss and weakness.  HENT: Positive for mouth dryness. Negative for mouth sores and nose dryness.   Eyes: Negative for pain, redness and dryness.  Respiratory: Negative for cough, shortness of breath and difficulty breathing.   Cardiovascular: Negative for chest pain, palpitations, hypertension and irregular heartbeat.  Gastrointestinal: Negative for constipation, diarrhea and vomiting.    PMFS History:  Patient Active Problem List   Diagnosis Date Noted  . Fibromyalgia 06/30/2016  . Other fatigue 06/30/2016  . Insomnia 06/30/2016  . DJD (degenerative joint disease), cervical 06/30/2016  . 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 IBS (irritable bowel syndrome) 06/30/2016  . Interstitial cystitis 06/30/2016  . Left-sided chest wall pain 11/25/2014  . SHOULDER PAIN,  RIGHT 09/05/2010  . TOBACCO USER 06/27/2010  . GERD 03/24/2009  . SACROILIAC STRAIN 03/24/2009  . CHEST PAIN 01/20/2008  . HEMATURIA UNSPECIFIED 10/14/2007  . FIBROMYALGIA 07/03/2007  . HYPERLIPIDEMIA NEC/NOS 05/16/2007  . HEADACHE 05/09/2007    Past Medical History:  Diagnosis Date  . Cervical disc disease   . 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    . Fibromyalgia    sees Dr. Estanislado Pandy   . Heart murmur   . 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 Hyperlipidemia   . Migraines   . MVP (mitral valve prolapse)    no daily medication  . Peptic ulcer    no problems    Family History  Problem Relation Age of Onset  . Breast cancer Mother     lumpectomy in late 50/60  . Heart attack Father   . Congestive Heart Failure Father   . Cancer      breast/fhx  . Heart disease      fhx  . Cirrhosis Sister   . Cirrhosis Brother   . Diabetes type II Sister     and ? brother  . Arthritis Son     psoriatic  . Colon cancer Neg Hx   . Esophageal cancer Neg Hx   . Rectal cancer Neg Hx   . Stomach cancer Neg Hx    Past Surgical History:  Procedure Laterality Date  . CERVICAL LAMINECTOMY    . CESAREAN  SECTION  1993  . CHOLECYSTECTOMY  06/2002  . COLONOSCOPY  06-06-12   per Dr. Olevia Perches, hyperplastic polyps, repeat in 10 yrs   . HYSTEROSCOPY     years ago with Dr Radene Knee-? why was done   Social History   Social History Narrative  . No narrative on file     Objective: Vital Signs: BP 101/63 (BP Location: Left Arm, Patient Position: Sitting, Cuff Size: Small)   Pulse 78   Resp 14   Ht 5\' 5"  (1.651 m)   Wt 114 lb (51.7 kg)   BMI 18.97 kg/m    Physical Exam  Constitutional: She is oriented to person, place, and time. She appears well-developed and well-nourished.  HENT:  Head: Normocephalic and atraumatic.  Eyes: Conjunctivae and EOM are normal.  Neck: Normal range of motion.  Cardiovascular: Normal rate, regular rhythm,  normal heart sounds and intact distal pulses.   Pulmonary/Chest: Effort normal and breath sounds normal.  Abdominal: Soft. Bowel sounds are normal.  Lymphadenopathy:    She has no cervical adenopathy.  Neurological: She is alert and oriented to person, place, and time.  Skin: Skin is warm and dry. Capillary refill takes less than 2 seconds.  Psychiatric: She has a normal mood and affect. Her behavior is normal.  Nursing note and vitals reviewed.    Musculoskeletal Exam: C-spine good range of motion she bilateral trapezius spasm. Thoracic and lumbar spine good range of motion. Shoulder joints elbow joints wrist joint MCPs PIPs DIPs are good range of motion with no synovitis. Hip joints knee joints ankles MTPs PIPs with good range of motion with no synovitis.  CDAI Exam: No CDAI exam completed.    Investigation: No additional findings.   Imaging: No results found.  Speciality Comments: No specialty comments available.  05/11/2016 CBC normal, CMP normal  Procedures:  No procedures performed Allergies: Divalproex sodium and Nsaids   Assessment / Plan:     Visit Diagnoses: Fibromyalgia - On Cymbalta and Flexeril. She discontinued pregabalin. She's doing much better since she's been going to integrative therapies. We discussed tapering Cymbalta. She was in agreement with that. We'll decrease Cymbalta to 60 mg by mouth daily. If tolerated she can decrease it to 30 mg by mouth daily after a month or so.  Other fatigue: Improved  Primary insomnia - On Ambien when necessary. She states that her insomnia has been better after using alpha stim unit  DJD (degenerative joint disease), cervical: She does have some underlying discomfort in trapezius spasm. Proper posture and muscle stretching was discussed.  Spondylosis of lumbar region without myelopathy or radiculopathy: She is not having much discomfort.  Interstitial cystitis  Irritable bowel syndrome, unspecified type     Orders: No orders of the defined types were placed in this encounter.  Meds ordered this encounter  Medications  . DULoxetine (CYMBALTA) 30 MG capsule    Sig: Take 2 capsules by mouth every morning    Dispense:  180 capsule    Refill:  1    Face-to-face time spent with patient was 30 minutes. 50% of time was spent in counseling and coordination of care.  Follow-Up Instructions: Return in about 6 months (around 07/06/2017) for FMS, DDD.   Bo Merino, MD  Note - This record has been created using Editor, commissioning.  Chart creation errors have been sought, but may not always  have been located. Such creation errors do not reflect on  the standard of medical care.

## 2017-01-03 ENCOUNTER — Ambulatory Visit (INDEPENDENT_AMBULATORY_CARE_PROVIDER_SITE_OTHER): Payer: 59 | Admitting: Rheumatology

## 2017-01-03 ENCOUNTER — Encounter: Payer: Self-pay | Admitting: Rheumatology

## 2017-01-03 VITALS — BP 101/63 | HR 78 | Resp 14 | Ht 65.0 in | Wt 114.0 lb

## 2017-01-03 DIAGNOSIS — F5101 Primary insomnia: Secondary | ICD-10-CM

## 2017-01-03 DIAGNOSIS — M797 Fibromyalgia: Secondary | ICD-10-CM | POA: Diagnosis not present

## 2017-01-03 DIAGNOSIS — K589 Irritable bowel syndrome without diarrhea: Secondary | ICD-10-CM | POA: Diagnosis not present

## 2017-01-03 DIAGNOSIS — R5383 Other fatigue: Secondary | ICD-10-CM | POA: Diagnosis not present

## 2017-01-03 DIAGNOSIS — N3010 Interstitial cystitis (chronic) without hematuria: Secondary | ICD-10-CM | POA: Diagnosis not present

## 2017-01-03 DIAGNOSIS — M47812 Spondylosis without myelopathy or radiculopathy, cervical region: Secondary | ICD-10-CM

## 2017-01-03 DIAGNOSIS — M5030 Other cervical disc degeneration, unspecified cervical region: Secondary | ICD-10-CM | POA: Diagnosis not present

## 2017-01-03 DIAGNOSIS — M47816 Spondylosis without myelopathy or radiculopathy, lumbar region: Secondary | ICD-10-CM | POA: Diagnosis not present

## 2017-01-03 MED ORDER — DULOXETINE HCL 30 MG PO CPEP: ORAL_CAPSULE | ORAL | 1 refills | Status: DC

## 2017-01-04 DIAGNOSIS — M797 Fibromyalgia: Secondary | ICD-10-CM | POA: Diagnosis not present

## 2017-01-04 DIAGNOSIS — M542 Cervicalgia: Secondary | ICD-10-CM | POA: Diagnosis not present

## 2017-01-04 DIAGNOSIS — M545 Low back pain: Secondary | ICD-10-CM | POA: Diagnosis not present

## 2017-01-17 ENCOUNTER — Telehealth: Payer: Self-pay | Admitting: Obstetrics & Gynecology

## 2017-01-17 NOTE — Telephone Encounter (Signed)
Patient has some questions for the nurse about a medication.

## 2017-01-17 NOTE — Telephone Encounter (Signed)
Spoke with patient. Patient reports she noticed some spotting several weeks ago, increase provera from 1/2 tab to whole tab and is still experiencing spotting. Patient reports cramping with spotting and old blood. Patient reports spotting is not enough to wear a pad. Patient asking if it may be time to come off of HRT? Recommended OV for further evaluation, patient scheduled for 01/18/17 at 1:30pm with Dr. Sabra Heck. Patient is agreeable to date and time.  Routing to provider for final review. Patient is agreeable to disposition. Will close encounter.

## 2017-01-18 ENCOUNTER — Encounter: Payer: Self-pay | Admitting: Obstetrics & Gynecology

## 2017-01-18 ENCOUNTER — Other Ambulatory Visit (HOSPITAL_COMMUNITY)
Admission: RE | Admit: 2017-01-18 | Discharge: 2017-01-18 | Disposition: A | Discharge status: Home / Self Care | Payer: 59 | Source: Ambulatory Visit | Attending: Obstetrics & Gynecology | Admitting: Obstetrics & Gynecology

## 2017-01-18 ENCOUNTER — Ambulatory Visit (INDEPENDENT_AMBULATORY_CARE_PROVIDER_SITE_OTHER): Payer: 59 | Admitting: Obstetrics & Gynecology

## 2017-01-18 VITALS — BP 102/62 | HR 72 | Resp 16 | Ht 65.0 in | Wt 113.2 lb

## 2017-01-18 DIAGNOSIS — Z01419 Encounter for gynecological examination (general) (routine) without abnormal findings: Secondary | ICD-10-CM | POA: Diagnosis not present

## 2017-01-18 DIAGNOSIS — N950 Postmenopausal bleeding: Secondary | ICD-10-CM

## 2017-01-18 DIAGNOSIS — Z124 Encounter for screening for malignant neoplasm of cervix: Secondary | ICD-10-CM | POA: Diagnosis not present

## 2017-01-18 MED ORDER — MEDROXYPROGESTERONE ACETATE 5 MG PO TABS: 5.0000 mg | ORAL_TABLET | Freq: Every day | ORAL | 2 refills | Status: DC

## 2017-01-18 NOTE — Progress Notes (Signed)
GYNECOLOGY  VISIT   HPI: 61 y.o. G2P2 Married Caucasian female here for complaint of vaginal bleeding/spotting for the last three to four weeks.  She is only having to wear a panty liner.  She is having some cramping.  She does report she took 1/2 of progesterone dosage to see if she could lower this dosage in the end of February.  She did this for about 8 weeks.  About three weeks ago, she went back up to the 2.5mg  dosage.    GYNECOLOGIC HISTORY: No LMP recorded. Patient is postmenopausal. Contraception: PMP Menopausal hormone therapy: Premarin 0.45 and Provera 2.5mg   Patient Active Problem List   Diagnosis Date Noted  . Fibromyalgia 06/30/2016  . Other fatigue 06/30/2016  . Insomnia 06/30/2016  . DJD (degenerative joint disease), cervical 06/30/2016  . 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 IBS (irritable bowel syndrome) 06/30/2016  . Interstitial cystitis 06/30/2016  . Left-sided chest wall pain 11/25/2014  . SHOULDER PAIN, RIGHT 09/05/2010  . TOBACCO USER 06/27/2010  . GERD 03/24/2009  . SACROILIAC STRAIN 03/24/2009  . CHEST PAIN 01/20/2008  . HEMATURIA UNSPECIFIED 10/14/2007  . FIBROMYALGIA 07/03/2007  . HYPERLIPIDEMIA NEC/NOS 05/16/2007  . HEADACHE 05/09/2007    Past Medical History:  Diagnosis Date  . Cervical disc disease   . 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    . Fibromyalgia    sees Dr. Estanislado Pandy   . Heart murmur   . 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 Hyperlipidemia   . Migraines   . MVP (mitral valve prolapse)    no daily medication  . Peptic ulcer    no problems    Past Surgical History:  Procedure Laterality Date  . CERVICAL LAMINECTOMY    . CESAREAN SECTION  1993  . CHOLECYSTECTOMY  06/2002  . COLONOSCOPY  06-06-12   per Dr. Olevia Perches, hyperplastic polyps, repeat in 10 yrs   . HYSTEROSCOPY     years ago with Dr Radene Knee-? why was done    MEDS:  Reviewed  in EPIC and UTD  ALLERGIES: Divalproex sodium and Nsaids  Family History  Problem Relation Age of Onset  . Breast cancer Mother        lumpectomy in late 50/60  . Heart attack Father   . Congestive Heart Failure Father   . Cancer Unknown        breast/fhx  . Heart disease Unknown        fhx  . Cirrhosis Sister   . Cirrhosis Brother   . Diabetes type II Sister        and ? brother  . Arthritis Son        psoriatic  . Colon cancer Neg Hx   . Esophageal cancer Neg Hx   . Rectal cancer Neg Hx   . Stomach cancer Neg Hx     SH:  Married, smokes 1/2 PPD  Review of Systems  All other systems reviewed and are negative.   PHYSICAL EXAMINATION:    BP 102/62 (BP Location: Right Arm, Patient Position: Sitting, Cuff Size: Small)   Pulse 72   Resp 16   Ht 5\' 5"  (1.651 m)   Wt 113 lb 3.2 oz (51.3 kg)   BMI 18.84 kg/m     General appearance: alert, cooperative and appears stated age Abdomen: soft, non-tender; bowel sounds normal; no masses,  no organomegaly  Pelvic: External genitalia:  no  lesions              Urethra:  normal appearing urethra with no masses, tenderness or lesions              Bartholins and Skenes: normal                 Vagina: normal appearing vagina with normal color and discharge, no lesions              Cervix: no lesions.  Pap obtained.              Bimanual Exam:  Uterus:  normal size, contour, position, consistency, mobility, non-tender              Adnexa: no mass, fullness, tenderness              Anus:  no lesions  Endometrial biopsy recommended.  Discussed with patient.  Verbal and written consent obtained.   Procedure:  Speculum placed.  Cervix visualized and cleansed with betadine prep.  A single toothed tenaculum was applied to the anterior lip of the cervix.  Endometrial pipelle was advanced through the cervix into the endometrial cavity without difficulty.  Pipelle passed to 6.5cm.  Suction applied and pipelle removed with scant tissues  obtained.  Second pass performed.  Tenculum removed.  No bleeding noted.  Patient tolerated procedure well.   Chaperone was present for exam.  Assessment: PMP bleeding  Plan: Increase provera to 5mg  daily to see if can get bleeding to stop Pt does have interest in decreasing her HRT but she is going to wait until biopsy is back and bleeding has stopped Endometrial biopsy pending. Pap smear pending

## 2017-01-21 LAB — CYTOLOGY - PAP: Diagnosis: NEGATIVE

## 2017-01-25 ENCOUNTER — Telehealth: Payer: Self-pay | Admitting: *Deleted

## 2017-01-25 ENCOUNTER — Other Ambulatory Visit: Payer: Self-pay | Admitting: *Deleted

## 2017-01-25 MED ORDER — MEDROXYPROGESTERONE ACETATE 2.5 MG PO TABS: 2.5000 mg | ORAL_TABLET | Freq: Every day | ORAL | 0 refills | Status: DC

## 2017-01-25 MED ORDER — ESTROGENS CONJUGATED 0.3 MG PO TABS: 0.3000 mg | ORAL_TABLET | Freq: Every day | ORAL | 0 refills | Status: DC

## 2017-01-25 NOTE — Telephone Encounter (Signed)
-----   Message from Megan Salon, MD sent at 01/25/2017 12:21 PM EDT ----- Please let pt know pap and endometrial biopsy were both negative for abnormal cells.  If she is still bleeding, I'd like her to come in for a PUS to evaluate for polyps.  If bleeding has stopped, ok to decreased Premarin to 0.3mg  and provera to 2.5mg  daily.  Ok to send in rx's for pt.  Plan recheck 3 months.  Also, AEX needs to be in January if this can be moved.  Thanks.

## 2017-01-25 NOTE — Telephone Encounter (Signed)
Spoke with patient, advised of results and recommendations as seen below per Dr. Sabra Heck. Patient reports bleeding has stopped. Patient states she would like to finish current RX for Provera 5mg  and Premarin 0.45mg  if this is ok with Dr. Sabra Heck then change over, has 3 weeks left? Patient scheduled for 3 month f/u on 05/03/17 at 4pm, AEX rescheduled for 09/27/17 at 9:15am. Advised patient would review with Dr. Sabra Heck and return call with recommendations, patient verbalizes understanding and is agreeable.   Dr.  Sabra Heck, ok to complete current RX for provera and premarin then change?

## 2017-01-25 NOTE — Telephone Encounter (Signed)
Reviewed with Dr. Sabra Heck, ok to finish current prescription of provera and premarin, call office with any bleeding.  Call to patient, left detailed message, ok per current dpr, advised ok to finish current Rx, call office with any bleeding, questions/concerns.  Routing to provider for final review. Patient is agreeable to disposition. Will close encounter.

## 2017-02-11 ENCOUNTER — Other Ambulatory Visit: Payer: Self-pay | Admitting: Obstetrics & Gynecology

## 2017-02-11 DIAGNOSIS — M542 Cervicalgia: Secondary | ICD-10-CM | POA: Diagnosis not present

## 2017-02-11 DIAGNOSIS — M545 Low back pain: Secondary | ICD-10-CM | POA: Diagnosis not present

## 2017-02-11 DIAGNOSIS — M797 Fibromyalgia: Secondary | ICD-10-CM | POA: Diagnosis not present

## 2017-02-11 NOTE — Telephone Encounter (Signed)
Patient is asking for a refill of Premarin at the lower dose she talked about with Dr.Miller at her last appointment. Confirmed pharmacy with patient.

## 2017-02-11 NOTE — Telephone Encounter (Signed)
Medication refill request: Premarin (lowest does, see note below) Last AEX:  09-25-16  Next AEX: 09-27-17  Last MMG (if hormonal medication request): 06-01-16 WNL  Refill authorized: please advise

## 2017-02-13 MED ORDER — ESTROGENS CONJUGATED 0.3 MG PO TABS: 0.3000 mg | ORAL_TABLET | Freq: Every day | ORAL | 2 refills | Status: DC

## 2017-02-18 DIAGNOSIS — M797 Fibromyalgia: Secondary | ICD-10-CM | POA: Diagnosis not present

## 2017-02-18 DIAGNOSIS — M542 Cervicalgia: Secondary | ICD-10-CM | POA: Diagnosis not present

## 2017-02-18 DIAGNOSIS — M545 Low back pain: Secondary | ICD-10-CM | POA: Diagnosis not present

## 2017-03-04 DIAGNOSIS — M542 Cervicalgia: Secondary | ICD-10-CM | POA: Diagnosis not present

## 2017-03-04 DIAGNOSIS — M797 Fibromyalgia: Secondary | ICD-10-CM | POA: Diagnosis not present

## 2017-03-04 DIAGNOSIS — M545 Low back pain: Secondary | ICD-10-CM | POA: Diagnosis not present

## 2017-03-12 ENCOUNTER — Other Ambulatory Visit: Payer: Self-pay | Admitting: Family Medicine

## 2017-03-12 ENCOUNTER — Telehealth: Payer: Self-pay | Admitting: Obstetrics & Gynecology

## 2017-03-12 DIAGNOSIS — N950 Postmenopausal bleeding: Secondary | ICD-10-CM

## 2017-03-12 NOTE — Telephone Encounter (Signed)
I would advise a PUS at this time for additional evaluation of endometrium.

## 2017-03-12 NOTE — Telephone Encounter (Signed)
Patient called and said she has been having "bleeding off and on for about a week." She is post menopausal.  Last seen: 01/18/17

## 2017-03-12 NOTE — Telephone Encounter (Signed)
Spoke with patient. Declines PUS for 03/14/2017. Appointment for PUS scheduled for 03/28/2017 at 12:30 pm with 1 pm consult with Dr.Miller. Patient is agreeable to date and time. Order placed.  Routing to provider for final review. Patient agreeable to disposition. Will close encounter.

## 2017-03-12 NOTE — Telephone Encounter (Signed)
Patient was seen on 5/18/208 for PMB evaluation with EMB and pap. See results below. -Was taking Premarin 0.45 mg and Provera 2.5 mg daily. Then went to Premarin 5 mg daily until EMB returned.   -Taking Premarin 0.3 mg daily and Provera 2.5 mg daily which she has been taking for 3 weeks.    -Bleeding started 2 weeks ago, is bright red, and intermittent. Having to wear a panty liner. Also having intermittent cramping. Denies pain or heavy bleeding.   -Recommended PUS for patient. Patient requests recommendations from [place] regarding medication dosage before scheduling PUS. Asking if she needs to increase Provera?  Notes recorded by Burnice Logan, RN on 01/25/2017 at 4:22 PM EDT See telephone encounter dated 01/25/17. ------  Notes recorded by Megan Salon, MD on 01/25/2017 at 12:21 PM EDT Please let pt know pap and endometrial biopsy were both negative for abnormal cells. If she is still bleeding, I'd like her to come in for a PUS to evaluate for polyps. If bleeding has stopped, ok to decreased Premarin to 0.3mg  and provera to 2.5mg  daily. Ok to send in rx's for pt. Plan recheck 3 months. Also, AEX needs to be in January if this can be moved. Thanks.

## 2017-03-12 NOTE — Telephone Encounter (Signed)
Call in #90 with one rf 

## 2017-03-20 ENCOUNTER — Other Ambulatory Visit: Payer: Self-pay | Admitting: Family Medicine

## 2017-03-20 NOTE — Telephone Encounter (Signed)
Call in #120 with 5 rf 

## 2017-03-28 ENCOUNTER — Ambulatory Visit (INDEPENDENT_AMBULATORY_CARE_PROVIDER_SITE_OTHER): Payer: 59 | Admitting: Obstetrics & Gynecology

## 2017-03-28 ENCOUNTER — Ambulatory Visit (INDEPENDENT_AMBULATORY_CARE_PROVIDER_SITE_OTHER): Payer: 59

## 2017-03-28 ENCOUNTER — Encounter: Payer: Self-pay | Admitting: Obstetrics & Gynecology

## 2017-03-28 VITALS — BP 104/66 | HR 72 | Resp 14 | Ht 65.0 in | Wt 111.0 lb

## 2017-03-28 DIAGNOSIS — N950 Postmenopausal bleeding: Secondary | ICD-10-CM | POA: Diagnosis not present

## 2017-03-28 DIAGNOSIS — Z79890 Hormone replacement therapy (postmenopausal): Secondary | ICD-10-CM | POA: Diagnosis not present

## 2017-03-28 DIAGNOSIS — Z87891 Personal history of nicotine dependence: Secondary | ICD-10-CM | POA: Diagnosis not present

## 2017-03-28 MED ORDER — CONJ ESTROG-MEDROXYPROGEST ACE 0.625-5 MG PO TABS: 1.0000 | ORAL_TABLET | Freq: Every day | ORAL | 3 refills | Status: DC

## 2017-03-28 NOTE — Progress Notes (Signed)
61 y.o. G2P2 Married Caucasian female here for pelvic ultrasound due to persistent PMP bleeding.  Pt is still having spotting.  Reports with the current HRT dosage she is feeling more hot flashes and more fatigue.  She just doesn't feel as good.  She wants to go back to the original dosage--the 0.625mg  premarin dosage.  She is also just completely sick of the spotting that has continued.  It is not heavy and at times is barely there but hasn't stopped.  Denies cramping.  No bowel or bladder changes.  Denies pain.    No LMP recorded. Patient is postmenopausal.  Contraception: PMP  Findings:  UTERUS: 6.0 x 4.0 x 2.4cm EMS:1.63mm ADNEXA: Left ovary: 1.0 x 1.0 x 0.9cm       Right ovary: 1.2 x 0.9 x 0.9cm CUL DE SAC: no free fluid  Discussion:  Findings reviewed.  Pt did have negative endometrial biopsy showing these results: FINAL DIAGNOSIS Diagnosis Endometrium, biopsy - MUCOID MATERIAL WITH SCANT AMOUNTS OF ATROPHIC SURFACE ENDOMETRIAL GLANDULAR EPITHELIUM, SCATTERED AGGREGATES OF BENIGN ATROPHIC SQUAMOUS EPITHELIUM AND INFLAMMATORY CELLS. - NO ENDOMETRIAL HYPERPLASIA, ATYPIA, CERVICAL DYSPLASIA OR MALIGNANCY IDENTIFIED.  As she is still bleeding despite changing HRT dosage, feel hysteroscopy with D&C is important for additional evaluation and hopefully to get the bleeding stopped.  She is not interested in stopping her HRT as she is already more symptomatic just on lower dosage.    Procedure discussed with patient.  Hospital stay, recovery and pain management all discussed.  Risks discussed including but not limited to bleeding, 1% risk of receiving a  transfusion, infection, 3-4% risk of bowel/bladder/ureteral/vascular injury discussed as well as possible need for additional surgery if injury does occur discussed.  DVT/PE and rare risk of death discussed.  My actual complications with prior surgeries discussed.  Vaginal cuff dehiscence discussed.  Hernia formation discussed.  Positioning and  incision locations discussed.  Patient aware if pathology abnormal she may need additional treatment.  All questions answered.    Physical Exam  Constitutional: She is oriented to person, place, and time. She appears well-developed and well-nourished.  Cardiovascular: Normal rate and regular rhythm.   Pulmonary/Chest: Effort normal and breath sounds normal.  Neurological: She is alert and oriented to person, place, and time.  Skin: Skin is warm and dry.  Psychiatric: She has a normal mood and affect.   Assessment:  Persistent PMP bleeding  Plan:  Pan hysteroscopy and D&C.  Will give post-op rx's after procedure is completed.  Will also double the premarin 0.3 and prover 2.5mg  daily until she uses what she has on hand.  Will then transition to Prem/pro 0.625/5 or premarin 0.625 and provera 5mg .  Pt desires Prem/pro rx to be sent to pharmacy to see about cost.  ~30 minutes spent with patient >50% of time was in face to face discussion of above.

## 2017-03-30 ENCOUNTER — Other Ambulatory Visit: Payer: Self-pay | Admitting: Obstetrics & Gynecology

## 2017-03-30 DIAGNOSIS — Z87891 Personal history of nicotine dependence: Secondary | ICD-10-CM | POA: Insufficient documentation

## 2017-03-30 DIAGNOSIS — Z79890 Hormone replacement therapy (postmenopausal): Secondary | ICD-10-CM | POA: Insufficient documentation

## 2017-04-01 ENCOUNTER — Telehealth: Payer: Self-pay | Admitting: Obstetrics & Gynecology

## 2017-04-01 NOTE — Telephone Encounter (Signed)
Call placed to patient to review benefits for a scheduled surgical procedure. Left voicemail requesting a return call.

## 2017-04-02 ENCOUNTER — Encounter (HOSPITAL_BASED_OUTPATIENT_CLINIC_OR_DEPARTMENT_OTHER): Payer: Self-pay | Admitting: *Deleted

## 2017-04-02 DIAGNOSIS — M542 Cervicalgia: Secondary | ICD-10-CM | POA: Diagnosis not present

## 2017-04-02 DIAGNOSIS — M797 Fibromyalgia: Secondary | ICD-10-CM | POA: Diagnosis not present

## 2017-04-02 DIAGNOSIS — M545 Low back pain: Secondary | ICD-10-CM | POA: Diagnosis not present

## 2017-04-02 NOTE — Progress Notes (Signed)
To [facility] at 0600-CBC to be drawn prior- EKG with chart Npo after Mn.May take cymbalta with small amt water in am.

## 2017-04-08 ENCOUNTER — Encounter (HOSPITAL_BASED_OUTPATIENT_CLINIC_OR_DEPARTMENT_OTHER)
Admission: RE | Admit: 2017-04-08 | Discharge: 2017-04-08 | Disposition: A | Discharge status: Home / Self Care | Payer: 59 | Source: Ambulatory Visit | Attending: Obstetrics & Gynecology | Admitting: Obstetrics & Gynecology

## 2017-04-08 DIAGNOSIS — R5383 Other fatigue: Secondary | ICD-10-CM | POA: Diagnosis present

## 2017-04-08 LAB — CBC
HCT: 43.5 % (ref 36.0–46.0)
Hemoglobin: 14.3 g/dL (ref 12.0–15.0)
MCH: 31.2 pg (ref 26.0–34.0)
MCHC: 32.9 g/dL (ref 30.0–36.0)
MCV: 94.8 fL (ref 78.0–100.0)
Platelets: 199 10*3/uL (ref 150–400)
RBC: 4.59 MIL/uL (ref 3.87–5.11)
RDW: 13.4 % (ref 11.5–15.5)
WBC: 6.3 10*3/uL (ref 4.0–10.5)

## 2017-04-11 NOTE — Anesthesia Preprocedure Evaluation (Addendum)
Anesthesia Evaluation  Patient identified by MRN, date of birth, ID band Patient awake    Reviewed: Allergy & Precautions, NPO status , Patient's Chart, lab work & pertinent test results  Airway Mallampati: II  TM Distance: >3 FB Neck ROM: Full    Dental  (+) Dental Advisory Given, Teeth Intact, Caps, Implants,    Pulmonary former smoker,    breath sounds clear to auscultation       Cardiovascular negative cardio ROS   Rhythm:Regular Rate:Normal     Neuro/Psych  Headaches, Anxiety Depression    GI/Hepatic Neg liver ROS, PUD, GERD  ,  Endo/Other  negative endocrine ROS  Renal/GU negative Renal ROS     Musculoskeletal  (+) Arthritis , Fibromyalgia -  Abdominal   Peds  Hematology negative hematology ROS (+)   Anesthesia Other Findings   Reproductive/Obstetrics                          Lab Results  Component Value Date   WBC 6.3 04/08/2017   HGB 14.3 04/08/2017   HCT 43.5 04/08/2017   MCV 94.8 04/08/2017   PLT 199 04/08/2017    Anesthesia Physical Anesthesia Plan  ASA: II  Anesthesia Plan: General   Post-op Pain Management:    Induction: Intravenous  PONV Risk Score and Plan: 4 or greater and Ondansetron, Dexamethasone, Midazolam, Scopolamine patch - Pre-op and Treatment may vary due to age or medical condition  Airway Management Planned: LMA  Additional Equipment:   Intra-op Plan:   Post-operative Plan: Extubation in OR  Informed Consent: I have reviewed the patients History and Physical, chart, labs and discussed the procedure including the risks, benefits and alternatives for the proposed anesthesia with the patient or authorized representative who has indicated his/her understanding and acceptance.   Dental advisory given  Plan Discussed with: CRNA  Anesthesia Plan Comments:        Anesthesia Quick Evaluation

## 2017-04-12 ENCOUNTER — Encounter (HOSPITAL_BASED_OUTPATIENT_CLINIC_OR_DEPARTMENT_OTHER)
Admission: RE | Disposition: A | Discharge status: Home / Self Care | Payer: Self-pay | Source: Ambulatory Visit | Attending: Obstetrics & Gynecology

## 2017-04-12 ENCOUNTER — Ambulatory Visit (HOSPITAL_BASED_OUTPATIENT_CLINIC_OR_DEPARTMENT_OTHER): Payer: 59 | Admitting: Anesthesiology

## 2017-04-12 ENCOUNTER — Ambulatory Visit (HOSPITAL_BASED_OUTPATIENT_CLINIC_OR_DEPARTMENT_OTHER)
Admission: RE | Admit: 2017-04-12 | Discharge: 2017-04-12 | Disposition: A | Discharge status: Home / Self Care | Payer: 59 | Source: Ambulatory Visit | Attending: Obstetrics & Gynecology | Admitting: Obstetrics & Gynecology

## 2017-04-12 ENCOUNTER — Encounter (HOSPITAL_BASED_OUTPATIENT_CLINIC_OR_DEPARTMENT_OTHER): Payer: Self-pay | Admitting: *Deleted

## 2017-04-12 DIAGNOSIS — Z79899 Other long term (current) drug therapy: Secondary | ICD-10-CM | POA: Insufficient documentation

## 2017-04-12 DIAGNOSIS — Z791 Long term (current) use of non-steroidal anti-inflammatories (NSAID): Secondary | ICD-10-CM | POA: Diagnosis not present

## 2017-04-12 DIAGNOSIS — M797 Fibromyalgia: Secondary | ICD-10-CM | POA: Insufficient documentation

## 2017-04-12 DIAGNOSIS — F419 Anxiety disorder, unspecified: Secondary | ICD-10-CM | POA: Insufficient documentation

## 2017-04-12 DIAGNOSIS — E785 Hyperlipidemia, unspecified: Secondary | ICD-10-CM | POA: Diagnosis not present

## 2017-04-12 DIAGNOSIS — F329 Major depressive disorder, single episode, unspecified: Secondary | ICD-10-CM | POA: Insufficient documentation

## 2017-04-12 DIAGNOSIS — Z87891 Personal history of nicotine dependence: Secondary | ICD-10-CM | POA: Insufficient documentation

## 2017-04-12 DIAGNOSIS — Z7982 Long term (current) use of aspirin: Secondary | ICD-10-CM | POA: Insufficient documentation

## 2017-04-12 DIAGNOSIS — Z79890 Hormone replacement therapy (postmenopausal): Secondary | ICD-10-CM

## 2017-04-12 DIAGNOSIS — N950 Postmenopausal bleeding: Secondary | ICD-10-CM | POA: Insufficient documentation

## 2017-04-12 DIAGNOSIS — K589 Irritable bowel syndrome without diarrhea: Secondary | ICD-10-CM | POA: Diagnosis not present

## 2017-04-12 HISTORY — PX: DILATATION & CURETTAGE/HYSTEROSCOPY WITH MYOSURE: SHX6511

## 2017-04-12 SURGERY — DILATATION & CURETTAGE/HYSTEROSCOPY WITH MYOSURE
Anesthesia: General | Site: Uterus

## 2017-04-12 MED ORDER — LIDOCAINE 2% (20 MG/ML) 5 ML SYRINGE
INTRAMUSCULAR | Status: DC | PRN
  Administered 2017-04-12: 60 mg via INTRAVENOUS

## 2017-04-12 MED ORDER — ACETAMINOPHEN 10 MG/ML IV SOLN
1000.0000 mg | Freq: Once | INTRAVENOUS | Status: DC | PRN
  Filled 2017-04-12: qty 100

## 2017-04-12 MED ORDER — KETOROLAC TROMETHAMINE 30 MG/ML IJ SOLN
INTRAMUSCULAR | Status: AC
Start: 2017-04-12 — End: 2017-04-12
  Filled 2017-04-12: qty 1

## 2017-04-12 MED ORDER — HYDROMORPHONE HCL 1 MG/ML IJ SOLN
0.2500 mg | INTRAMUSCULAR | Status: DC | PRN
  Filled 2017-04-12: qty 0.5

## 2017-04-12 MED ORDER — HYDROCODONE-ACETAMINOPHEN 5-325 MG PO TABS: 1.0000 | ORAL_TABLET | Freq: Four times a day (QID) | ORAL | 0 refills | Status: DC | PRN

## 2017-04-12 MED ORDER — KETOROLAC TROMETHAMINE 15 MG/ML IJ SOLN
INTRAMUSCULAR | Status: DC | PRN
  Administered 2017-04-12: 15 mg via INTRAVENOUS

## 2017-04-12 MED ORDER — MIDAZOLAM HCL 2 MG/2ML IJ SOLN
INTRAMUSCULAR | Status: AC
  Filled 2017-04-12: qty 2

## 2017-04-12 MED ORDER — PROPOFOL 10 MG/ML IV BOLUS
INTRAVENOUS | Status: AC
  Filled 2017-04-12: qty 40

## 2017-04-12 MED ORDER — DEXAMETHASONE SODIUM PHOSPHATE 4 MG/ML IJ SOLN
INTRAMUSCULAR | Status: DC | PRN
  Administered 2017-04-12: 10 mg via INTRAVENOUS

## 2017-04-12 MED ORDER — SODIUM CHLORIDE 0.9 % IR SOLN
Status: DC | PRN
  Administered 2017-04-12: 3000 mL

## 2017-04-12 MED ORDER — PROMETHAZINE HCL 25 MG/ML IJ SOLN
6.2500 mg | INTRAMUSCULAR | Status: DC | PRN
  Filled 2017-04-12: qty 1

## 2017-04-12 MED ORDER — ONDANSETRON HCL 4 MG/2ML IJ SOLN
INTRAMUSCULAR | Status: DC | PRN
Start: 2017-04-12 — End: 2017-04-12
  Administered 2017-04-12: 4 mg via INTRAVENOUS

## 2017-04-12 MED ORDER — DEXAMETHASONE SODIUM PHOSPHATE 10 MG/ML IJ SOLN
INTRAMUSCULAR | Status: AC
  Filled 2017-04-12: qty 1

## 2017-04-12 MED ORDER — MIDAZOLAM HCL 5 MG/5ML IJ SOLN
INTRAMUSCULAR | Status: DC | PRN
  Administered 2017-04-12: 1 mg via INTRAVENOUS

## 2017-04-12 MED ORDER — FENTANYL CITRATE (PF) 100 MCG/2ML IJ SOLN
INTRAMUSCULAR | Status: DC | PRN
  Administered 2017-04-12 (×2): 25 ug via INTRAVENOUS
  Administered 2017-04-12: 50 ug via INTRAVENOUS

## 2017-04-12 MED ORDER — PROPOFOL 10 MG/ML IV BOLUS
INTRAVENOUS | Status: DC | PRN
  Administered 2017-04-12: 150 mg via INTRAVENOUS

## 2017-04-12 MED ORDER — LIDOCAINE-EPINEPHRINE 1 %-1:100000 IJ SOLN
INTRAMUSCULAR | Status: DC | PRN
  Administered 2017-04-12: 10 mL

## 2017-04-12 MED ORDER — FENTANYL CITRATE (PF) 100 MCG/2ML IJ SOLN
INTRAMUSCULAR | Status: AC
  Filled 2017-04-12: qty 2

## 2017-04-12 MED ORDER — LACTATED RINGERS IV SOLN
INTRAVENOUS | Status: DC
  Administered 2017-04-12: 06:00:00 via INTRAVENOUS
  Filled 2017-04-12: qty 1000

## 2017-04-12 MED ORDER — ONDANSETRON HCL 4 MG/2ML IJ SOLN
INTRAMUSCULAR | Status: AC
  Filled 2017-04-12: qty 2

## 2017-04-12 SURGICAL SUPPLY — 24 items
BIPOLAR CUTTING LOOP 21FR (ELECTRODE)
CANISTER SUCT 3000ML PPV (MISCELLANEOUS) ×4 IMPLANT
CATH ROBINSON RED A/P 16FR (CATHETERS) ×2 IMPLANT
CLOTH BEACON ORANGE TIMEOUT ST (SAFETY) ×2 IMPLANT
DEVICE MYOSURE LITE (MISCELLANEOUS) IMPLANT
DEVICE MYOSURE REACH (MISCELLANEOUS) IMPLANT
DILATOR CANAL MILEX (MISCELLANEOUS) IMPLANT
FILTER ARTHROSCOPY CONVERTOR (FILTER) ×2 IMPLANT
GLOVE ECLIPSE 6.5 STRL STRAW (GLOVE) ×4 IMPLANT
GLOVE INDICATOR 7.0 STRL GRN (GLOVE) ×2 IMPLANT
GOWN STRL REUS W/ TWL LRG LVL3 (GOWN DISPOSABLE) ×1 IMPLANT
GOWN STRL REUS W/ TWL XL LVL3 (GOWN DISPOSABLE) ×1 IMPLANT
GOWN STRL REUS W/TWL LRG LVL3 (GOWN DISPOSABLE) ×1
GOWN STRL REUS W/TWL XL LVL3 (GOWN DISPOSABLE) ×1
IV NS IRRIG 3000ML ARTHROMATIC (IV SOLUTION) ×4 IMPLANT
KIT RM TURNOVER CYSTO AR (KITS) ×2 IMPLANT
LOOP CUTTING BIPOLAR 21FR (ELECTRODE) IMPLANT
PACK VAGINAL MINOR WOMEN LF (CUSTOM PROCEDURE TRAY) ×2 IMPLANT
PAD OB MATERNITY 4.3X12.25 (PERSONAL CARE ITEMS) ×2 IMPLANT
SEAL ROD LENS SCOPE MYOSURE (ABLATOR) ×2 IMPLANT
TOWEL OR 17X24 6PK STRL BLUE (TOWEL DISPOSABLE) ×4 IMPLANT
TUBING AQUILEX INFLOW (TUBING) ×2 IMPLANT
TUBING AQUILEX OUTFLOW (TUBING) ×4 IMPLANT
WATER STERILE IRR 500ML POUR (IV SOLUTION) ×2 IMPLANT

## 2017-04-12 NOTE — Transfer of Care (Signed)
Immediate Anesthesia Transfer of Care Note  Patient: Emily Ward  Procedure(s) Performed: Procedure(s): DILATATION & CURETTAGE/HYSTEROSCOPY (N/A)  Patient Location: PACU  Anesthesia Type:General  Level of Consciousness: awake, alert , oriented and patient cooperative  Airway & Oxygen Therapy: Patient Spontanous Breathing and Patient connected to nasal cannula oxygen  Post-op Assessment: Report given to RN and Post -op Vital signs reviewed and stable  Post vital signs: Reviewed and stable  Last Vitals:  Vitals:   04/12/17 0558  BP: 110/79  Pulse: 69  Resp: 14  Temp: 37.2 C  SpO2: 96%    Last Pain:  Vitals:   04/12/17 0606  TempSrc:   PainSc: 3       Patients Stated Pain Goal: 4 (63/94/32 0037)  Complications: No apparent anesthesia complications

## 2017-04-12 NOTE — Anesthesia Postprocedure Evaluation (Signed)
Anesthesia Post Note  Patient: Emily Ward  Procedure(s) Performed: Procedure(s) (LRB): DILATATION & CURETTAGE/HYSTEROSCOPY (N/A)     Patient location during evaluation: PACU Anesthesia Type: General Level of consciousness: awake and alert Pain management: pain level controlled Vital Signs Assessment: post-procedure vital signs reviewed and stable Respiratory status: spontaneous breathing, nonlabored ventilation, respiratory function stable and patient connected to nasal cannula oxygen Cardiovascular status: blood pressure returned to baseline and stable Postop Assessment: no signs of nausea or vomiting Anesthetic complications: no    Last Vitals:  Vitals:   04/12/17 0830 04/12/17 0845  BP: 113/74 108/67  Pulse: 77 78  Resp: 18 14  Temp:    SpO2: 92% 97%    Last Pain:  Vitals:   04/12/17 0845  TempSrc:   PainSc: 2                  Tiajuana Amass

## 2017-04-12 NOTE — Discharge Instructions (Addendum)
Post-surgical Instructions, Outpatient Surgery  You may expect to feel dizzy, weak, and drowsy for as long as 24 hours after receiving the medicine that made you sleep (anesthetic). For the first 24 hours after your surgery:   Do not drive a car, ride a bicycle, participate in physical activities, or take public transportation until you are done taking narcotic pain medicines or as directed by Dr. Miller.  Do not drink alcohol or take tranquilizers.  Do not take medicine that has not been prescribed by your physicians.  Do not sign important papers or make important decisions while on narcotic pain medicines.  Have a responsible person with you.   PAIN MANAGEMENT Motrin 800mg.  (This is the same as 4-200mg over the counter tablets of Motrin or ibuprofen.)  You may take this every eight hours or as needed for cramping.   Vicodin 5/325mg.  For more severe pain, take one or two tablets every four to six hours as needed for pain control.  (Remember that narcotic pain medications increase your risk of constipation.  If this becomes a problem, you may take an over the counter stool softener like Colace 100mg up to four times a day.)  DO'S AND DON'T'S Do not take a tub bath for one week.  You may shower on the first day after your surgery Do not do any heavy lifting for one to two weeks.  This increases the chance of bleeding. Do move around as you feel able.  Stairs are fine.  You may begin to exercise again as you feel able.  Do not lift any weights for two weeks. Do not put anything in the vagina for two weeks--no tampons, intercourse, or douching.    REGULAR MEDIATIONS/VITAMINS: You may restart all of your regular medications as prescribed. You may restart all of your vitamins as you normally take them.    PLEASE CALL OR SEEK MEDICAL CARE IF: You have persistent nausea and vomiting.  You have trouble eating or drinking.  You have an oral temperature above 100.5.  You have constipation that is  not helped by adjusting diet or increasing fluid intake. Pain medicines are a common cause of constipation.  You have heavy vaginal bleeding   Post Anesthesia Home Care Instructions  Activity: Get plenty of rest for the remainder of the day. A responsible individual must stay with you for 24 hours following the procedure.  For the next 24 hours, DO NOT: -Drive a car -Operate machinery -Drink alcoholic beverages -Take any medication unless instructed by your physician -Make any legal decisions or sign important papers.  Meals: Start with liquid foods such as gelatin or soup. Progress to regular foods as tolerated. Avoid greasy, spicy, heavy foods. If nausea and/or vomiting occur, drink only clear liquids until the nausea and/or vomiting subsides. Call your physician if vomiting continues.  Special Instructions/Symptoms: Your throat may feel dry or sore from the anesthesia or the breathing tube placed in your throat during surgery. If this causes discomfort, gargle with warm salt water. The discomfort should disappear within 24 hours.  If you had a scopolamine patch placed behind your ear for the management of post- operative nausea and/or vomiting:  1. The medication in the patch is effective for 72 hours, after which it should be removed.  Wrap patch in a tissue and discard in the trash. Wash hands thoroughly with soap and water. 2. You may remove the patch earlier than 72 hours if you experience unpleasant side effects which may   include dry mouth, dizziness or visual disturbances. 3. Avoid touching the patch. Wash your hands with soap and water after contact with the patch.      

## 2017-04-12 NOTE — Anesthesia Procedure Notes (Signed)
Procedure Name: LMA Insertion Date/Time: 04/12/2017 7:29 AM Performed by: Wanita Chamberlain Pre-anesthesia Checklist: Patient identified, Emergency Drugs available, Suction available, Patient being monitored and Timeout performed Patient Re-evaluated:Patient Re-evaluated prior to induction Oxygen Delivery Method: Circle system utilized Preoxygenation: Pre-oxygenation with 100% oxygen Induction Type: IV induction Ventilation: Mask ventilation without difficulty LMA: LMA inserted LMA Size: 3.0 Number of attempts: 1 Placement Confirmation: breath sounds checked- equal and bilateral and positive ETCO2 Tube secured with: Tape Dental Injury: Teeth and Oropharynx as per pre-operative assessment

## 2017-04-12 NOTE — H&P (Signed)
KATHERINA WIMER is an 61 y.o. female G2P2 MWF here for hysteroscopy with D&C due to persistent PMP bleeding on HRT.  She has tried to wean down on her HRT and has been unsuccessful.  She's undergone an office ultrasound and endometrial biopsy without any significant findings.  Due to persistent bleeding, she is here for additional evaluation today.  Procedure, risks and benefits have been discussed.  As she's already undergone outpatient evaluation and there has been no significant finding noted, there really are no alternatives to this but continue to adjust HRT and monitor conservatively.  However, I do not feel this is the best course of action given the persistent bleeding.  Pt is in agreement and is here for procedure today.  Pertinent Gynecological History: Menses: post-menopausal Bleeding: post menopausal bleeding Contraception: BTL DES exposure: denies Blood transfusions: none Sexually transmitted diseases: no past history Previous GYN Procedures: none  Last mammogram: normal Date: 06/01/16 Last pap: normal Date: 01/18/17 OB History: G2, P2   Menstrual History: No LMP recorded. Patient is postmenopausal.    Past Medical History:  Diagnosis Date  . Cervical disc disease 2006  . 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    . Fibromyalgia    sees Dr. Estanislado Pandy   . Heart murmur   . 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 Hyperlipidemia   . Migraines   . MVP (mitral valve prolapse)    no daily medication  . Peptic ulcer    no problems    Past Surgical History:  Procedure Laterality Date  . CERVICAL LAMINECTOMY  2006  . CESAREAN SECTION  1993  . CHOLECYSTECTOMY  06/2002  . COLONOSCOPY  06-06-12   per Dr. Olevia Perches, hyperplastic polyps, repeat in 10 yrs   . HYSTEROSCOPY     years ago with Dr Radene Knee-? 2000  . TUBAL LIGATION      Family History  Problem Relation Age of Onset  . Breast cancer Mother        lumpectomy in late 50/60   . Heart attack Father   . Congestive Heart Failure Father   . Cancer Unknown        breast/fhx  . Heart disease Unknown        fhx  . Diabetes type II Sister        and ? brother  . Arthritis Son        psoriatic  . Colon cancer Neg Hx   . Esophageal cancer Neg Hx   . Rectal cancer Neg Hx   . Stomach cancer Neg Hx     Social History:  reports that she quit smoking about 4 years ago. Her smoking use included Cigarettes. She has a 10.00 pack-year smoking history. She has never used smokeless tobacco. She reports that she drinks about 0.6 oz of alcohol per week . She reports that she does not use drugs.  Allergies:  Allergies  Allergen Reactions  . Depakote [Divalproex Sodium]     Drug inducted hepatitis  . Nsaids     Upset stomach    Prescriptions Prior to Admission  Medication Sig Dispense Refill Last Dose  . butalbital-acetaminophen-caffeine (FIORICET, ESGIC) 50-325-40 MG tablet TAKE 1 TABLET BY MOUTH EVERY 6 HOURS AS NEEDED 120 tablet 5 Past Month at Unknown time  . chlorzoxazone (PARAFON) 500 MG tablet TAKE 1 TABLET BY MOUTH FOUR TIMES DAILY AS NEEDED FOR MUSCLE SPASMS 120 tablet 5 Past Week at  Unknown time  . cyclobenzaprine (FLEXERIL) 10 MG tablet Take 1 tablet (10 mg total) by mouth at bedtime. for muscle spams 30 tablet 5 Past Week at Unknown time  . DULoxetine (CYMBALTA) 30 MG capsule Take 2 capsules by mouth every morning 180 capsule 1 04/12/2017 at 00500  . estrogen, conjugated,-medroxyprogesterone (PREMPRO) 0.625-5 MG tablet Take 1 tablet by mouth daily. 90 tablet 3 04/11/2017 at Unknown time  . estrogens, conjugated, (PREMARIN) 0.3 MG tablet Take 1 tablet (0.3 mg total) by mouth daily. Take 1 tablet by mouth daily. 90 tablet 2 04/11/2017 at Unknown time  . omega-3 acid ethyl esters (LOVAZA) 1 g capsule TAKE ONE CAPSULE BY MOUTH TWICE DAILY 60 capsule 5 04/11/2017 at Unknown time  . zolpidem (AMBIEN) 10 MG tablet TAKE 1 TABLET BY MOUTH EVERY DAY AT BEDTIME 90 tablet 1  04/11/2017 at Unknown time  . aspirin 81 MG tablet Take 81 mg by mouth daily.     04/05/2017  . cetirizine (ZYRTEC) 10 MG tablet Take 10 mg by mouth as needed.    Unknown at Unknown time  . medroxyPROGESTERone (PROVERA) 2.5 MG tablet Take 1 tablet (2.5 mg total) by mouth daily. 90 tablet 0 More than a month at Unknown time  . zolmitriptan (ZOMIG) 5 MG tablet Take 1 tablet (5 mg total) by mouth as needed. 30 tablet 3 More than a month at Unknown time    Review of Systems  All other systems reviewed and are negative.   Blood pressure 110/79, pulse 69, temperature 98.9 F (37.2 C), temperature source Oral, resp. rate 14, weight 110 lb 8 oz (50.1 kg), SpO2 96 %. Physical Exam  Constitutional: She is oriented to person, place, and time. She appears well-developed and well-nourished.  Cardiovascular: Normal rate and regular rhythm.   Respiratory: Effort normal and breath sounds normal.  Neurological: She is alert and oriented to person, place, and time.  Skin: Skin is warm and dry.  Psychiatric: She has a normal mood and affect.    No results found for this or any previous visit (from the past 24 hour(s)).  No results found.  Assessment/Plan: 61 yo G2P2 MWF with recurrent PMP bleeding here for additional evaluation with hysteroscopy, D&C.  All questions answered.  Pt ready to proceed.  Hale Bogus Shakti 04/12/2017, 7:00 AM

## 2017-04-12 NOTE — Op Note (Signed)
04/12/2017  8:03 AM  PATIENT:  Emily Ward  61 y.o. female  PRE-OPERATIVE DIAGNOSIS:  PMB, on HRT  POST-OPERATIVE DIAGNOSIS:  PMB, on HRT  PROCEDURE:  Procedure(s): DILATATION & CURETTAGE/HYSTEROSCOPY  SURGEON:  Naimah Yingst Cass  ASSISTANTS: OR staff   ANESTHESIA:   general  ESTIMATED BLOOD LOSS: 10cc  BLOOD ADMINISTERED:none   FLUIDS: 650cc LR  UOP: 100cc clear  SPECIMEN:  Endometrial curettings  DISPOSITION OF SPECIMEN:  PATHOLOGY  FINDINGS: thin endometrium, arcuate shaped endometrial cavity  DESCRIPTION OF OPERATION: Patient was taken to the operating room.  She is placed in the supine position. SCDs were on her lower extremities and functioning properly. General anesthesia with an LMA was administered without difficulty.   Legs were then placed in the [place] in the low lithotomy position. The legs were lifted to the high lithotomy position and the Betadine prep was used on the inner thighs perineum and vagina x3. Patient was draped in a normal standard fashion. An in and out catheterization with a red rubber Foley catheter was performed. Approximately 100 cc of clear urine was noted. A bivalve speculum was placed the vagina. The anterior lip of the cervix was grasped with single-tooth tenaculum.  A paracervical block of 1% lidocaine mixed one-to-one with epinephrine (1:100,000 units).  10 cc was used total. The cervix is dilated up to #21 [facility] dilators. The endometrial cavity sounded to 7 cm.   A myosure hysteroscope was obtained. NS was used as a hysteroscopic fluid. The hysteroscope was advanced through the endocervical canal into the endometrial cavity. The tubal ostia were noted bilaterally. The endometrium appeared thin.  Photo documentation was obtained.  The hysteroscope was removed. A #1 toothed curette was used to curette the cavity until rough gritty texture noted in all quadrants. The cavity was visualized again with the hysteroscope.  No abnormal  findings were noted.  A second curetting was performed and the procedure was ended.  The fluid deficit was 125 cc. The tenaculum was removed from the anterior lip of the cervix. The speculum was removed from the vagina. The prep was cleansed of the patient's skin. The legs are positioned back in the supine position. Sponge, lap, needle, initially counts were correct x2. Patient was taken to recovery in stable condition.  COUNTS:  YES  PLAN OF CARE: Transfer to PACU

## 2017-04-15 ENCOUNTER — Ambulatory Visit (INDEPENDENT_AMBULATORY_CARE_PROVIDER_SITE_OTHER): Payer: 59 | Admitting: Family Medicine

## 2017-04-15 ENCOUNTER — Encounter (HOSPITAL_BASED_OUTPATIENT_CLINIC_OR_DEPARTMENT_OTHER): Payer: Self-pay | Admitting: Obstetrics & Gynecology

## 2017-04-15 ENCOUNTER — Ambulatory Visit (INDEPENDENT_AMBULATORY_CARE_PROVIDER_SITE_OTHER)
Admission: RE | Admit: 2017-04-15 | Discharge: 2017-04-15 | Disposition: A | Discharge status: Home / Self Care | Payer: 59 | Source: Ambulatory Visit | Attending: Family Medicine | Admitting: Family Medicine

## 2017-04-15 VITALS — BP 120/82 | HR 90 | Temp 98.9°F | Wt 112.0 lb

## 2017-04-15 DIAGNOSIS — R0789 Other chest pain: Secondary | ICD-10-CM | POA: Diagnosis not present

## 2017-04-15 MED ORDER — MELOXICAM 7.5 MG PO TABS: 7.5000 mg | ORAL_TABLET | Freq: Every day | ORAL | 0 refills | Status: DC

## 2017-04-15 NOTE — Progress Notes (Signed)
Subjective:    Patient ID: Emily Ward, female    DOB: 1956-08-22, 61 y.o.   MRN: [id number]  HPI  [name] is a 61 year old female who presents today with left lateral chest wall pain that occurs with movement and started greater than two weeks ago. She reports a trigger of twisting her upper body during intercourse and felt this was MSK strain. She states that the pain has not been continuous but she has noticed this more over the past 2 days. Pain is noted as a 6 with movement and deep breathing but can be as low as a 3. Treatment with ibuprofen 600 mg yesterday that did not provide relief. She has had this type of pain in the same location before and it was found to be a muscle strain. On 04/12/17 she had a DNC due to post menopausal bleeding and she has a scheduled post [facility] follow up on 04/25/17. She denies any increase in pain and notes that this pain occurred prior to procedure. History of fibromyalgia where she is followed by rheumatology where she is treated with cymbalta. She does report intermittent flare ups however has been well controlled per patient with cymbalta. She denies fever, chest pain, palpitations, SOB, congestion, coughing, wheezing, N/V, DOE, worsening with activity, rash, new bed or new bra. PMH: Fibromyalgia, GERD,   Review of Systems  Constitutional: Negative for chills, fatigue and fever.  HENT: Negative for congestion.   Respiratory: Negative for cough, shortness of breath and wheezing.   Cardiovascular: Negative for chest pain, palpitations and leg swelling.  Gastrointestinal: Negative for abdominal pain, constipation, diarrhea, nausea and vomiting.  Musculoskeletal:       Chest wall pain  Skin: Negative for rash.  Psychiatric/Behavioral:       Denies depressed or anxious mood   Past Medical History:  Diagnosis Date  . Cervical disc disease 2006  . 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    . Fibromyalgia      sees Dr. Estanislado Pandy   . Heart murmur   . 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 Hyperlipidemia   . Migraines   . MVP (mitral valve prolapse)    no daily medication  . Peptic ulcer    no problems     Social History   Social History  . Marital status: Married    Spouse name: N/A  . Number of children: N/A  . Years of education: N/A   Occupational History  . Not on file.   Social History Main Topics  . Smoking status: Former Smoker    Packs/day: 0.50    Years: 20.00    Types: Cigarettes    Quit date: 04/02/2013  . Smokeless tobacco: Never Used  . Alcohol use 0.6 oz/week    1 Glasses of wine per week     Comment: occ  . Drug use: No  . Sexual activity: Yes    Partners: Male   Other Topics Concern  . Not on file   Social History Narrative  . No narrative on file    Past Surgical History:  Procedure Laterality Date  . CERVICAL LAMINECTOMY  2006  . CESAREAN SECTION  1993  . CHOLECYSTECTOMY  06/2002  . COLONOSCOPY  06-06-12   per Dr. Olevia Perches, hyperplastic polyps, repeat in 10 yrs   . DILATATION & CURETTAGE/HYSTEROSCOPY WITH MYOSURE N/A 04/12/2017   Procedure: DILATATION & CURETTAGE/HYSTEROSCOPY;  Surgeon: Sabra Heck,  Lemmie Evens, MD;  Location: [facility];  Service: Gynecology;  Laterality: N/A;  . HYSTEROSCOPY     years ago with Dr Radene Knee-? 2000  . TUBAL LIGATION      Family History  Problem Relation Age of Onset  . Breast cancer Mother        lumpectomy in late 50/60  . Heart attack Father   . Congestive Heart Failure Father   . Cancer Unknown        breast/fhx  . Heart disease Unknown        fhx  . Diabetes type II Sister        and ? brother  . Arthritis Son        psoriatic  . Colon cancer Neg Hx   . Esophageal cancer Neg Hx   . Rectal cancer Neg Hx   . Stomach cancer Neg Hx     Allergies  Allergen Reactions  . Depakote [Divalproex Sodium]     Drug inducted hepatitis    Current Outpatient Prescriptions on File Prior to Visit   Medication Sig Dispense Refill  . aspirin 81 MG tablet Take 81 mg by mouth daily.      . butalbital-acetaminophen-caffeine (FIORICET, ESGIC) 50-325-40 MG tablet TAKE 1 TABLET BY MOUTH EVERY 6 HOURS AS NEEDED 120 tablet 5  . cetirizine (ZYRTEC) 10 MG tablet Take 10 mg by mouth as needed.     . chlorzoxazone (PARAFON) 500 MG tablet TAKE 1 TABLET BY MOUTH FOUR TIMES DAILY AS NEEDED FOR MUSCLE SPASMS 120 tablet 5  . cyclobenzaprine (FLEXERIL) 10 MG tablet Take 1 tablet (10 mg total) by mouth at bedtime. for muscle spams 30 tablet 5  . DULoxetine (CYMBALTA) 30 MG capsule Take 2 capsules by mouth every morning 180 capsule 1  . estrogen, conjugated,-medroxyprogesterone (PREMPRO) 0.625-5 MG tablet Take 1 tablet by mouth daily. 90 tablet 3  . HYDROcodone-acetaminophen (NORCO/VICODIN) 5-325 MG tablet Take 1-2 tablets by mouth every 6 (six) hours as needed for moderate pain or severe pain. 12 tablet 0  . omega-3 acid ethyl esters (LOVAZA) 1 g capsule TAKE ONE CAPSULE BY MOUTH TWICE DAILY 60 capsule 5  . zolmitriptan (ZOMIG) 5 MG tablet Take 1 tablet (5 mg total) by mouth as needed. 30 tablet 3  . zolpidem (AMBIEN) 10 MG tablet TAKE 1 TABLET BY MOUTH EVERY DAY AT BEDTIME 90 tablet 1   No current facility-administered medications on file prior to visit.     BP 120/82 (BP Location: Right Arm, Patient Position: Sitting, Cuff Size: Normal)   Pulse 90   Temp 98.9 F (37.2 C) (Oral)   Wt 112 lb (50.8 kg)   SpO2 98%   BMI 18.64 kg/m        Objective:   Physical Exam  Constitutional: She is oriented to person, place, and time.  Thin, optimally nourished female  HENT:  Mouth/Throat: Oropharynx is clear and moist and mucous membranes are normal.  Eyes: Pupils are equal, round, and reactive to light. No scleral icterus.  Neck: Neck supple.  Cardiovascular: Normal rate, regular rhythm and intact distal pulses.   Pulmonary/Chest: Effort normal and breath sounds normal. She has no wheezes. She has no  rales.  Abdominal: Soft. Bowel sounds are normal. She exhibits no distension. There is no tenderness.  Musculoskeletal: She exhibits no edema.       Arms: Lymphadenopathy:    She has no cervical adenopathy.  Neurological: She is alert and oriented to person, place, and  time. Coordination normal.  Skin: Skin is warm and dry. No rash noted.  Psychiatric: She has a normal mood and affect. Her behavior is normal. Judgment and thought content normal.       Assessment & Plan:  1. Left-sided chest wall pain Likely muscular vs small rib fracture; will obtain X-ray today;  EKG tracing is personally reviewed with supervising physician.  EKG notes NSR.  No acute changes.  EKG Interpretation Date/Time:04/15/17; 14:20 Ventricular Rate: 79 PR Interval:152 QRS Duration:88 QT Interval:364 Text Interpretation: EKG compared to reading on 05/15/16 and is unchanged.  - D-dimer, Quantitative - DG Chest 2 View; Future - EKG 12-Lead - meloxicam (MOBIC) 7.5 MG tablet; Take 1 tablet (7.5 mg total) by mouth daily.  Dispense: 30 tablet; Refill: 0  Short term trial of meloxicam for inflammation and advised patient to take medication with food. She has cyclobenzaprine and can use this if needed. Advised gentle stretching and she can also try heat and tiger balm for pain if needed. Advised follow up with PCP in 2-3 weeks or sooner if needed.   Delano Metz, FNP-C

## 2017-04-15 NOTE — Patient Instructions (Signed)
Please go to [facility] - located [address] across [address] from [place] - in the basement - Hours: 8:30-5:30 PM M-F. Do not need appointment.    Take meloxicam with food as directed and follow up with PCP in 2 to 3 weeks or sooner if needed.  You can also try gentle stretching and can use heat and tiger balm for pain. Also, as you have a muscle relaxer already, you can try this to ease discomfort.     Chest Wall Pain Chest wall pain is pain in or around the bones and muscles of your chest. Sometimes, an injury causes this pain. Sometimes, the cause may not be known. This pain may take several weeks or longer to get better. Follow these instructions at home: Pay attention to any changes in your symptoms. Take these actions to help with your pain:  Rest as told by your health care provider.  Avoid activities that cause pain. These include any activities that use your chest muscles or your abdominal and side muscles to lift heavy items.  If directed, apply ice to the painful area: ? Put ice in a plastic bag. ? Place a towel between your skin and the bag. ? Leave the ice on for 20 minutes, 2-3 times per day.  Take over-the-counter and prescription medicines only as told by your health care provider.  Do not use tobacco products, including cigarettes, chewing tobacco, and e-cigarettes. If you need help quitting, ask your health care provider.  Keep all follow-up visits as told by your health care provider. This is important.  Contact a health care provider if:  You have a fever.  Your chest pain becomes worse.  You have new symptoms. Get help right away if:  You have nausea or vomiting.  You feel sweaty or light-headed.  You have a cough with phlegm (sputum) or you cough up blood.  You develop shortness of breath. This information is not intended to replace advice given to you by your health care provider. Make sure you discuss any questions you have with  your health care provider. Document Released: 08/20/2005 Document Revised: 12/29/2015 Document Reviewed: 11/15/2014 Elsevier Interactive Patient Education  2017 [employer].

## 2017-04-16 LAB — D-DIMER, QUANTITATIVE: D-Dimer, Quant: 0.45 mcg/mL FEU (ref <0.50)

## 2017-04-17 DIAGNOSIS — M545 Low back pain: Secondary | ICD-10-CM | POA: Diagnosis not present

## 2017-04-17 DIAGNOSIS — M542 Cervicalgia: Secondary | ICD-10-CM | POA: Diagnosis not present

## 2017-04-17 DIAGNOSIS — M797 Fibromyalgia: Secondary | ICD-10-CM | POA: Diagnosis not present

## 2017-04-18 ENCOUNTER — Telehealth: Payer: Self-pay | Admitting: Rheumatology

## 2017-04-18 NOTE — Telephone Encounter (Signed)
Thank you, I have called her to advise.

## 2017-04-18 NOTE — Telephone Encounter (Signed)
Patient states she did see her PCP had CXR EKG was normal has been given, Meloxicam, and has Flexeril  Started PT, wants to know if you have any other advise, I told her it sounds like she is doing everything she needs to do, but will double check with you   (She was well when she was here in May, you tapered her Cymbalta to 60 mg per day) previously she was on 90 mg per day

## 2017-04-18 NOTE — Telephone Encounter (Signed)
No other suggestions

## 2017-04-18 NOTE — Telephone Encounter (Signed)
Patient having a lot of pain with back, mid back. Hurts to take a deep breathe. Patient did not know if it is due to [facility] or something else. Patient has not had an injury that she knows of. Pain has been going on for about two weeks. Please call to advise. Patient has rov 07/04/17. Pain is waking her up at night.

## 2017-04-19 ENCOUNTER — Telehealth: Payer: Self-pay | Admitting: Rheumatology

## 2017-04-19 DIAGNOSIS — M797 Fibromyalgia: Secondary | ICD-10-CM | POA: Diagnosis not present

## 2017-04-19 DIAGNOSIS — M545 Low back pain: Secondary | ICD-10-CM | POA: Diagnosis not present

## 2017-04-19 DIAGNOSIS — M542 Cervicalgia: Secondary | ICD-10-CM | POA: Diagnosis not present

## 2017-04-19 NOTE — Telephone Encounter (Signed)
Left mssg for her to call back

## 2017-04-19 NOTE — Telephone Encounter (Signed)
Patient states she currently takes 60 mg of Cymbalta and would like to know if she can increase back up to 90 mg for a while. Please advise.  Patient uses [facility] on [address].

## 2017-04-19 NOTE — Telephone Encounter (Signed)
Patient returned Amy's call. Please call patient back.

## 2017-04-19 NOTE — Telephone Encounter (Signed)
Called patient to advise  °

## 2017-04-19 NOTE — Telephone Encounter (Signed)
Cymbalta 60 mg is the recommended dose for the fibromyalgia syndrome. If she needs a higher dose for depression then she should get it from her PCP or psychiatrist.

## 2017-04-24 NOTE — Telephone Encounter (Signed)
Patient states has been advised she unable to go up on Cymbalta as 60 mg is the recommended dose for treatment of Fibromyalgia syndrome. Patient states she is feeling better now that she has had a few PT sessions and also been taking anti-inflammatories.

## 2017-04-24 NOTE — Progress Notes (Deleted)
Post Operative Visit  Procedure:DILATATION & CURETTAGE/HYSTEROSCOPY  Days Post-op:  13 days   Subjective: ***  Objective: There were no vitals taken for this visit.  EXAM General: {Exam; general:16600} Resp: {Exam; lung:16931} Cardio: {Exam; heart:5510} GI: {Exam, RF:1638466} Extremities: {Exam; extremity:5109} Vaginal Bleeding: {exam; vaginal bleeding:3041122}  Assessment: s/p ***  Plan: Recheck {NUMBER 1-10:22536} weeks ***

## 2017-04-25 ENCOUNTER — Telehealth: Payer: Self-pay | Admitting: Obstetrics & Gynecology

## 2017-04-25 ENCOUNTER — Ambulatory Visit: Payer: 59 | Admitting: Obstetrics & Gynecology

## 2017-04-25 NOTE — Telephone Encounter (Signed)
Left message on voicemail to call and reschedule appointment from today.

## 2017-05-03 ENCOUNTER — Other Ambulatory Visit: Payer: Self-pay | Admitting: Family Medicine

## 2017-05-03 ENCOUNTER — Ambulatory Visit: Payer: Self-pay | Admitting: Obstetrics & Gynecology

## 2017-05-03 DIAGNOSIS — Z1231 Encounter for screening mammogram for malignant neoplasm of breast: Secondary | ICD-10-CM

## 2017-05-06 ENCOUNTER — Other Ambulatory Visit: Payer: Self-pay | Admitting: Family Medicine

## 2017-05-06 DIAGNOSIS — R0789 Other chest pain: Secondary | ICD-10-CM

## 2017-05-07 ENCOUNTER — Ambulatory Visit (INDEPENDENT_AMBULATORY_CARE_PROVIDER_SITE_OTHER): Payer: 59 | Admitting: Obstetrics & Gynecology

## 2017-05-07 ENCOUNTER — Encounter: Payer: Self-pay | Admitting: Obstetrics & Gynecology

## 2017-05-07 VITALS — BP 110/60 | HR 84 | Resp 16 | Ht 65.0 in | Wt 112.0 lb

## 2017-05-07 DIAGNOSIS — N950 Postmenopausal bleeding: Secondary | ICD-10-CM

## 2017-05-07 DIAGNOSIS — R232 Flushing: Secondary | ICD-10-CM

## 2017-05-07 NOTE — Telephone Encounter (Signed)
Dr Sarajane Jews patient,

## 2017-05-07 NOTE — Progress Notes (Signed)
Post Operative Visit  Procedure: DILATATION & CURETTAGE/HYSTEROSCOPY WITH MYOSURE Days Post-op: 25  Subjective: Doing well.  Had spotting for a few days post op but this stopped after about 8 days.  Never took any pain medication.  Had no bowel or urinary issues post operatively.  Having some hot flashes.  Doesn't seem that the change is estrogen has really helped with the hot flashes.    Objective: BP 110/60 (BP Location: Right Arm, Patient Position: Sitting, Cuff Size: Normal)   Pulse 84   Resp 16   Ht 5\' 5"  (1.651 m)   Wt 112 lb (50.8 kg)   BMI 18.64 kg/m   EXAM General: alert and cooperative GI: soft, non-tender; bowel sounds normal; no masses,  no organomegaly Extremities: extremities normal, atraumatic, no cyanosis or edema Vaginal Bleeding: none  Gyn:  NAEFG, vaginal without lesions, cervix close, no CMT, uterus normal and non tender, no masses  Assessment: s/p hysteroscopy with D&C due to PMP bleeding Hot flashes On HRT  Plan: Check TSH today.  Does not want to change any medications at this point.  May need to consider alterative options for hot flashes as she and I agree not to make any changes with her HRT.  Options discussed.  She will consider and will await TSH results.  ~15 minutes spent with patient >50% of time was in face to face discussion of above in addition to post op exam.

## 2017-05-08 LAB — TSH: TSH: 2.12 u[IU]/mL (ref 0.450–4.500)

## 2017-05-10 ENCOUNTER — Telehealth: Payer: Self-pay | Admitting: Obstetrics & Gynecology

## 2017-05-10 NOTE — Telephone Encounter (Signed)
Returned patient's call regarding TSH results. Advised patient Dr.Miller has not reviewed yet but it looked okay. Once Dr.Miller reviews she'll probably send her a MyChart message with further recommendations or we will call her. Routed to [place]

## 2017-05-10 NOTE — Telephone Encounter (Signed)
Patient called to see if her recent lab results are back from 05/07/17.

## 2017-05-23 ENCOUNTER — Encounter: Payer: Self-pay | Admitting: Family Medicine

## 2017-06-03 ENCOUNTER — Ambulatory Visit
Admission: RE | Admit: 2017-06-03 | Discharge: 2017-06-03 | Disposition: A | Discharge status: Home / Self Care | Payer: 59 | Source: Ambulatory Visit | Attending: Family Medicine | Admitting: Family Medicine

## 2017-06-03 DIAGNOSIS — Z1231 Encounter for screening mammogram for malignant neoplasm of breast: Secondary | ICD-10-CM | POA: Diagnosis not present

## 2017-06-11 NOTE — Telephone Encounter (Signed)
Hysteroscopy completed on 04/12/17. Ok to close encounter

## 2017-06-18 ENCOUNTER — Other Ambulatory Visit: Payer: Self-pay | Admitting: Rheumatology

## 2017-06-19 NOTE — Telephone Encounter (Signed)
Last visit: 01/03/17 Next visit: 07/04/17  Ok to refill per Dr. Estanislado Pandy.

## 2017-06-23 NOTE — Progress Notes (Signed)
Office Visit Note  Patient: Emily Ward             Date of Birth: 14-Nov-1955           MRN: [id number]             PCP: Laurey Morale, MD Referring: Laurey Morale, MD Visit Date: 07/04/2017 Occupation: @GUAROCC @    Subjective:  Neck pain and lower back pain.   History of Present Illness: Emily Ward is a 61 y.o. female with history of fibromyalgia osteoarthritis and disc disease. She states she was doing much better after going to physical therapy. Now the pain has recurred. She's been having discomfort in her neck and trapezius area. She's been also experiencing some numbness in her left fifth and fourth finger. The lower back pain is tolerable. Her insomnia is better. She states she has some increased pain from fibromyalgia.  Activities of Daily Living:  Patient reports morning stiffness for 15  minutes.   Patient Denies nocturnal pain.  Difficulty dressing/grooming: Reports Difficulty climbing stairs: Denies Difficulty getting out of chair: Denies Difficulty using hands for taps, buttons, cutlery, and/or writing: Reports   Review of Systems  Constitutional: Negative for fatigue.  HENT: Positive for mouth dryness.   Eyes: Negative for dryness.  Respiratory: Negative for cough, shortness of breath and difficulty breathing.   Cardiovascular: Negative.  Negative for palpitations, hypertension and irregular heartbeat.  Gastrointestinal: Negative.  Negative for blood in stool, constipation and diarrhea.  Endocrine: Negative.   Genitourinary: Negative.  Negative for nocturia.  Musculoskeletal: Positive for arthralgias, joint pain, myalgias, muscle weakness, morning stiffness and myalgias. Negative for joint swelling.  Skin: Negative.  Negative for color change, rash, hair loss, ulcers and sensitivity to sunlight.  Neurological: Negative for numbness and headaches.  Hematological: Negative.  Negative for swollen glands.  Psychiatric/Behavioral: Negative.  Negative  for depressed mood and sleep disturbance. The patient is not nervous/anxious.     PMFS History:  Patient Active Problem List   Diagnosis Date Noted  . Hormone replacement therapy (HRT) 03/30/2017  . Former smoker 03/30/2017  . Other fatigue 06/30/2016  . Insomnia 06/30/2016  . DJD (degenerative joint disease), cervical 06/30/2016  . 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 IBS (irritable bowel syndrome) 06/30/2016  . Interstitial cystitis 06/30/2016  . Left-sided chest wall pain 11/25/2014  . SHOULDER PAIN, RIGHT 09/05/2010  . GERD 03/24/2009  . SACROILIAC STRAIN 03/24/2009  . CHEST PAIN 01/20/2008  . HEMATURIA UNSPECIFIED 10/14/2007  . FIBROMYALGIA 07/03/2007  . HYPERLIPIDEMIA NEC/NOS 05/16/2007  . HEADACHE 05/09/2007    Past Medical History:  Diagnosis Date  . Cervical disc disease 2006  . 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    . Fibromyalgia    sees Dr. Estanislado Pandy   . Heart murmur   . 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 Hyperlipidemia   . Migraines   . MVP (mitral valve prolapse)    no daily medication  . Peptic ulcer    no problems    Family History  Problem Relation Age of Onset  . Breast cancer Mother        lumpectomy in late 50/60  . Heart attack Father   . Congestive Heart Failure Father   . Cancer Unknown        breast/fhx  . Heart disease Unknown        fhx  .  Diabetes type II Sister        and ? brother  . Arthritis Son        psoriatic  . Colon cancer Neg Hx   . Esophageal cancer Neg Hx   . Rectal cancer Neg Hx   . Stomach cancer Neg Hx    Past Surgical History:  Procedure Laterality Date  . CERVICAL LAMINECTOMY  2006  . CESAREAN SECTION  1993  . CHOLECYSTECTOMY  06/2002  . COLONOSCOPY  06-06-12   per Dr. Olevia Perches, hyperplastic polyps, repeat in 10 yrs   . DILATATION & CURETTAGE/HYSTEROSCOPY WITH MYOSURE N/A 04/12/2017   Procedure: DILATATION &  CURETTAGE/HYSTEROSCOPY;  Surgeon: Megan Salon, MD;  Location: [facility];  Service: Gynecology;  Laterality: N/A;  . HYSTEROSCOPY     years ago with Dr Radene Knee-? 2000  . TUBAL LIGATION     Social History   Social History Narrative  . No narrative on file     Objective: Vital Signs: BP 117/71   Pulse 77   Resp 14   Ht 5\' 5"  (1.651 m)   Wt 109 lb (49.4 kg)   BMI 18.14 kg/m    Physical Exam  Constitutional: She is oriented to person, place, and time. She appears well-developed and well-nourished.  HENT:  Head: Normocephalic and atraumatic.  Eyes: Conjunctivae and EOM are normal.  Neck: Normal range of motion.  Cardiovascular: Normal rate, regular rhythm, normal heart sounds and intact distal pulses.   Pulmonary/Chest: Effort normal and breath sounds normal.  Abdominal: Soft. Bowel sounds are normal.  Lymphadenopathy:    She has no cervical adenopathy.  Neurological: She is alert and oriented to person, place, and time.  Skin: Skin is warm and dry. Capillary refill takes less than 2 seconds.  Psychiatric: She has a normal mood and affect. Her behavior is normal.  Nursing note and vitals reviewed.    Musculoskeletal Exam: C-spine and thoracic lumbar spine good range of motion. She has bilateral trapezius spasm and discomfort. She also has some lower back pain with range of motion. Shoulder joints elbow joints wrist joint MCPs PIPs DIPs with good range of motion with no synovitis. Hip joints knee joints ankles MTPs PIPs with good range of motion with no synovitis.  CDAI Exam: No CDAI exam completed.    Investigation: No additional findings. CBC Latest Ref Rng & Units 04/08/2017 05/11/2016 04/21/2015  WBC 4.0 - 10.5 K/uL 6.3 5.4 5.7  Hemoglobin 12.0 - 15.0 g/dL 14.3 14.9 14.4  Hematocrit 36.0 - 46.0 % 43.5 44.0 43.8  Platelets 150 - 400 K/uL 199 212.0 188.0   CMP Latest Ref Rng & Units 05/11/2016 04/21/2015 04/09/2014  Glucose 70 - 99 mg/dL 102(H) 98 91  BUN 6 -  23 mg/dL 12 20 17   Creatinine 0.40 - 1.20 mg/dL 0.64 0.67 0.6  Sodium 135 - 145 mEq/L 138 142 140  Potassium 3.5 - 5.1 mEq/L 3.8 5.0 5.1  Chloride 96 - 112 mEq/L 105 109 105  CO2 19 - 32 mEq/L 29 27 30   Calcium 8.4 - 10.5 mg/dL 9.1 9.6 9.6  Total Protein 6.0 - 8.3 g/dL 6.6 6.7 6.7  Total Bilirubin 0.2 - 1.2 mg/dL 0.3 0.2 0.4  Alkaline Phos 39 - 117 U/L 71 62 90  AST 0 - 37 U/L 20 16 15   ALT 0 - 35 U/L 11 7 8     Imaging: No results found.  Speciality Comments: No specialty comments available.    Procedures:  No  procedures performed Allergies: Depakote [divalproex sodium]   Assessment / Plan:     Visit Diagnoses: Fibromyalgia - On Cymbalta and Flexeril. She takes Flexeril on when necessary basis. She's been having and greasy spasm in her neck and lower back since she discontinued physical therapy. She believes that integrative therapies helped her significantly. She wants another referral to physical therapy.  - Plan: Ambulatory referral to Physical Therapy  Other fatigue: She has mild fatigue.  Primary insomnia - On Ambien when necessary. She was advised not to take Ambien with Flexeril.  DDD (degenerative disc disease), cervical -she's been having increased trapezius pain. Plan: Ambulatory referral to Physical Therapy  DDD (degenerative disc disease), lumbar - she continues to have some lower back pain. Plan: Ambulatory referral to Physical Therapy  Interstitial cystitis  Irritable bowel syndrome, unspecified type  History of gastroesophageal reflux (GERD)    Orders: Orders Placed This Encounter  Procedures  . Ambulatory referral to Physical Therapy   No orders of the defined types were placed in this encounter.   Face-to-face time spent with patient was 30 minutes. Greater than 50% of time was spent in counseling and coordination of care.  Follow-Up Instructions: Return in about 6 months (around 01/01/2018) for FMS DDD.   Bo Merino, MD  Note - This  record has been created using Editor, commissioning.  Chart creation errors have been sought, but may not always  have been located. Such creation errors do not reflect on  the standard of medical care.

## 2017-07-03 ENCOUNTER — Telehealth: Payer: Self-pay | Admitting: Obstetrics & Gynecology

## 2017-07-03 NOTE — Telephone Encounter (Signed)
Spoke with patient. Advised of message below. Offered appointment tomorrow and Friday. Patient declines due to other appointments. Appointment scheduled for 07/08/2017 at 3 pm with Dr.Miller. Patient is agreeable to date and time. Aware she will need to contact the office if bleeding increases or develops any new symptoms.  Cc: Dr.Miller  Routing to covering provider for final review. Patient agreeable to disposition. Will close encounter.

## 2017-07-03 NOTE — Telephone Encounter (Signed)
Please make an appointment to see Dr. Sabra Heck.   Cc - Dr. Sabra Heck

## 2017-07-03 NOTE — Telephone Encounter (Signed)
Spoke with patient. Patient is taking Prempro 0.625 mg daily, has not missed a dose or taken a dose late. Started a new pack last week. On 07/01/2017 she began having cramping and spotting. Is now wearing a panty liner that she changes 2-3 times daily. Bleeding went from bright red to dark red. Denies any heavy bleeding or pain. Patient had a D&C on 04/12/2017 with Dr.Miller with negative path. Advised will review with MD and return call. Patient is agreeable.  Cc: Dr.Miller  Routing to Dr.Silva as Dr.Miller is off today.

## 2017-07-03 NOTE — Telephone Encounter (Signed)
Patient having break through bleeding that started Monday.

## 2017-07-04 ENCOUNTER — Ambulatory Visit (INDEPENDENT_AMBULATORY_CARE_PROVIDER_SITE_OTHER): Payer: 59 | Admitting: Rheumatology

## 2017-07-04 ENCOUNTER — Encounter: Payer: Self-pay | Admitting: Rheumatology

## 2017-07-04 VITALS — BP 117/71 | HR 77 | Resp 14 | Ht 65.0 in | Wt 109.0 lb

## 2017-07-04 DIAGNOSIS — M797 Fibromyalgia: Secondary | ICD-10-CM | POA: Diagnosis not present

## 2017-07-04 DIAGNOSIS — F5101 Primary insomnia: Secondary | ICD-10-CM

## 2017-07-04 DIAGNOSIS — K589 Irritable bowel syndrome without diarrhea: Secondary | ICD-10-CM

## 2017-07-04 DIAGNOSIS — M5030 Other cervical disc degeneration, unspecified cervical region: Secondary | ICD-10-CM | POA: Diagnosis not present

## 2017-07-04 DIAGNOSIS — R5383 Other fatigue: Secondary | ICD-10-CM

## 2017-07-04 DIAGNOSIS — N3010 Interstitial cystitis (chronic) without hematuria: Secondary | ICD-10-CM | POA: Diagnosis not present

## 2017-07-04 DIAGNOSIS — Z8719 Personal history of other diseases of the digestive system: Secondary | ICD-10-CM

## 2017-07-04 DIAGNOSIS — M5136 Other intervertebral disc degeneration, lumbar region: Secondary | ICD-10-CM | POA: Diagnosis not present

## 2017-07-05 ENCOUNTER — Encounter: Payer: 59 | Admitting: Family Medicine

## 2017-07-08 ENCOUNTER — Ambulatory Visit (INDEPENDENT_AMBULATORY_CARE_PROVIDER_SITE_OTHER): Payer: 59 | Admitting: Obstetrics & Gynecology

## 2017-07-08 ENCOUNTER — Encounter: Payer: Self-pay | Admitting: Obstetrics & Gynecology

## 2017-07-08 VITALS — BP 100/60 | HR 72 | Resp 16 | Wt 107.0 lb

## 2017-07-08 DIAGNOSIS — N950 Postmenopausal bleeding: Secondary | ICD-10-CM | POA: Diagnosis not present

## 2017-07-08 MED ORDER — ESTRADIOL-NORETHINDRONE ACET 1-0.5 MG PO TABS: 1.0000 | ORAL_TABLET | Freq: Every day | ORAL | 3 refills | Status: DC

## 2017-07-08 NOTE — Progress Notes (Signed)
GYNECOLOGY  VISIT  CC:   Vaginal bleeding  HPI: 61 y.o. G2P2 Married Caucasian female here for complaint of postmenopausal bleeding.  Pt has undergone an ultasound, endometrial biopsy, pap smear, hysteroscopy, and change in HRT (estrogen dosage was lowered but not tolerated) to evaluate the recurrent spotting and to try and get her bleeding to stop.  She is sure she hasn't missed any dosage.  She is completely sure about this.  She is aware she should not have bleeding or spotting.  As well, she has tried to decrease the estrogen dosage and has been unsuccessful with this.  She and I have discussed possible hysterectomy if the bleeding continues but she is not ready for that at this time.    D/w pt today we could change her progesterone or consider changing her HRT altogether to a different combination with different progesterone to see if this would help.  She likes this idea better.  Options discussed.  Decided to change to [place].  Rx will be sent to pharmacy.  GYNECOLOGIC HISTORY: No LMP recorded. Patient is postmenopausal. Contraception: PMP Menopausal hormone therapy: Prempro 0625/5  Patient Active Problem List   Diagnosis Date Noted  . Hormone replacement therapy (HRT) 03/30/2017  . Former smoker 03/30/2017  . Other fatigue 06/30/2016  . Insomnia 06/30/2016  . DJD (degenerative joint disease), cervical 06/30/2016  . 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 IBS (irritable bowel syndrome) 06/30/2016  . Interstitial cystitis 06/30/2016  . Left-sided chest wall pain 11/25/2014  . SHOULDER PAIN, RIGHT 09/05/2010  . GERD 03/24/2009  . SACROILIAC STRAIN 03/24/2009  . CHEST PAIN 01/20/2008  . HEMATURIA UNSPECIFIED 10/14/2007  . FIBROMYALGIA 07/03/2007  . HYPERLIPIDEMIA NEC/NOS 05/16/2007  . HEADACHE 05/09/2007    Past Medical History:  Diagnosis Date  . Cervical disc disease 2006  . Fetal twin to twin transfusion 1993   with second  pregnancy.  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    . Fibromyalgia    sees Dr. Estanislado Pandy   . Heart murmur   . 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 Hyperlipidemia   . Migraines   . MVP (mitral valve prolapse)    no daily medication  . Peptic ulcer    no problems    Past Surgical History:  Procedure Laterality Date  . CERVICAL LAMINECTOMY  2006  . CESAREAN SECTION  1993  . CHOLECYSTECTOMY  06/2002  . COLONOSCOPY  06-06-12   per Dr. Olevia Perches, hyperplastic polyps, repeat in 10 yrs   . HYSTEROSCOPY     years ago with Dr Radene Knee-? 2000  . TUBAL LIGATION      MEDS:   Current Outpatient Medications on File Prior to Visit  Medication Sig Dispense Refill  . aspirin 81 MG tablet Take 81 mg by mouth daily.      . butalbital-acetaminophen-caffeine (FIORICET, ESGIC) 50-325-40 MG tablet TAKE 1 TABLET BY MOUTH EVERY 6 HOURS AS NEEDED 120 tablet 5  . cetirizine (ZYRTEC) 10 MG tablet Take 10 mg by mouth as needed.     . chlorzoxazone (PARAFON) 500 MG tablet TAKE 1 TABLET BY MOUTH FOUR TIMES DAILY AS NEEDED FOR MUSCLE SPASMS 120 tablet 5  . cyclobenzaprine (FLEXERIL) 10 MG tablet Take 1 tablet (10 mg total) by mouth at bedtime. for muscle spams 30 tablet 5  . DULoxetine (CYMBALTA) 30 MG capsule TAKE 2 CAPSULES BY MOUTH EVERY MORNING 180 capsule 0  . estrogen, conjugated,-medroxyprogesterone (PREMPRO) 0.625-5 MG tablet Take 1 tablet by mouth  daily. 90 tablet 3  . meloxicam (MOBIC) 7.5 MG tablet TAKE 1 TABLET(7.5 MG) BY MOUTH DAILY 30 tablet 1  . omega-3 acid ethyl esters (LOVAZA) 1 g capsule TAKE ONE CAPSULE BY MOUTH TWICE DAILY 60 capsule 5  . zolmitriptan (ZOMIG) 5 MG tablet Take 1 tablet (5 mg total) by mouth as needed. 30 tablet 3  . zolpidem (AMBIEN) 10 MG tablet TAKE 1 TABLET BY MOUTH EVERY DAY AT BEDTIME 90 tablet 1   No current facility-administered medications on file prior to visit.     ALLERGIES: Depakote [divalproex sodium]  Family History  Problem Relation Age of Onset  . Breast cancer  Mother        lumpectomy in late 50/60  . Heart attack Father   . Congestive Heart Failure Father   . Cancer Unknown        breast/fhx  . Heart disease Unknown        fhx  . Diabetes type II Sister        and ? brother  . Arthritis Son        psoriatic  . Colon cancer Neg Hx   . Esophageal cancer Neg Hx   . Rectal cancer Neg Hx   . Stomach cancer Neg Hx     SH:  Married, non smoker  Review of Systems  Gastrointestinal:       Vaginal spotting  All other systems reviewed and are negative.   PHYSICAL EXAMINATION:    BP 100/60 (BP Location: Right Arm, Patient Position: Sitting, Cuff Size: Normal)   Pulse 72   Resp 16   Wt 107 lb (48.5 kg)   BMI 17.81 kg/m     General appearance: alert, cooperative and appears stated age No other physical exam performed today  Assessment: PMP bleeding On HRT  Plan: Will switch to Activella 1.0/0.1mg  daily.  Rx to pharmacy. Pt has AEX scheduled for January and she will return then for follow-up.  She knows to call if has bleeding between now and then.   ~15 minutes spent with patient >50% of time was in face to face discussion of above.

## 2017-07-12 ENCOUNTER — Ambulatory Visit (INDEPENDENT_AMBULATORY_CARE_PROVIDER_SITE_OTHER): Payer: 59 | Admitting: Family Medicine

## 2017-07-12 ENCOUNTER — Encounter: Payer: Self-pay | Admitting: Family Medicine

## 2017-07-12 VITALS — BP 100/60 | Temp 98.5°F | Ht 65.0 in | Wt 106.0 lb

## 2017-07-12 DIAGNOSIS — Z0000 Encounter for general adult medical examination without abnormal findings: Secondary | ICD-10-CM | POA: Diagnosis not present

## 2017-07-12 DIAGNOSIS — Z23 Encounter for immunization: Secondary | ICD-10-CM

## 2017-07-12 DIAGNOSIS — R002 Palpitations: Secondary | ICD-10-CM

## 2017-07-12 LAB — BASIC METABOLIC PANEL
BUN: 13 mg/dL (ref 6–23)
CO2: 28 mEq/L (ref 19–32)
Calcium: 9.5 mg/dL (ref 8.4–10.5)
Chloride: 104 mEq/L (ref 96–112)
Creatinine, Ser: 0.58 mg/dL (ref 0.40–1.20)
GFR: 113.14 mL/min (ref >60.00)
Glucose, Bld: 94 mg/dL (ref 70–99)
Potassium: 4.1 mEq/L (ref 3.5–5.1)
Sodium: 139 mEq/L (ref 135–145)

## 2017-07-12 LAB — POC URINALSYSI DIPSTICK (AUTOMATED)
Bilirubin, UA: NEGATIVE
Blood, UA: NEGATIVE
Glucose, UA: NEGATIVE
Ketones, UA: NEGATIVE
Leukocytes, UA: NEGATIVE
Nitrite, UA: NEGATIVE
Protein, UA: NEGATIVE
Spec Grav, UA: >=1.03 — AB (ref 1.010–1.025)
Urobilinogen, UA: 0.2 E.U./dL
pH, UA: 5.5 (ref 5.0–8.0)

## 2017-07-12 LAB — CBC WITH DIFFERENTIAL/PLATELET
Basophils Absolute: 0 10*3/uL (ref 0.0–0.1)
Basophils Relative: 0.8 % (ref 0.0–3.0)
Eosinophils Absolute: 0.2 10*3/uL (ref 0.0–0.7)
Eosinophils Relative: 5.3 % — ABNORMAL HIGH (ref 0.0–5.0)
HCT: 42.5 % (ref 36.0–46.0)
Hemoglobin: 14 g/dL (ref 12.0–15.0)
Lymphocytes Relative: 40.6 % (ref 12.0–46.0)
Lymphs Abs: 1.5 10*3/uL (ref 0.7–4.0)
MCHC: 33.1 g/dL (ref 30.0–36.0)
MCV: 95.2 fl (ref 78.0–100.0)
Monocytes Absolute: 0.2 10*3/uL (ref 0.1–1.0)
Monocytes Relative: 6.2 % (ref 3.0–12.0)
Neutro Abs: 1.8 10*3/uL (ref 1.4–7.7)
Neutrophils Relative %: 47.1 % (ref 43.0–77.0)
Platelets: 176 10*3/uL (ref 150.0–400.0)
RBC: 4.46 Mil/uL (ref 3.87–5.11)
RDW: 13.4 % (ref 11.5–15.5)
WBC: 3.8 10*3/uL — ABNORMAL LOW (ref 4.0–10.5)

## 2017-07-12 LAB — HEPATIC FUNCTION PANEL
ALT: 8 U/L (ref 0–35)
AST: 18 U/L (ref 0–37)
Albumin: 4.1 g/dL (ref 3.5–5.2)
Alkaline Phosphatase: 73 U/L (ref 39–117)
Bilirubin, Direct: 0.1 mg/dL (ref 0.0–0.3)
Total Bilirubin: 0.4 mg/dL (ref 0.2–1.2)
Total Protein: 6.5 g/dL (ref 6.0–8.3)

## 2017-07-12 LAB — TSH: TSH: 1.67 u[IU]/mL (ref 0.35–4.50)

## 2017-07-12 LAB — LIPID PANEL
Cholesterol: 192 mg/dL (ref 0–200)
HDL: 65.7 mg/dL (ref >39.00)
LDL Cholesterol: 112 mg/dL — ABNORMAL HIGH (ref 0–99)
NonHDL: 126.72
Total CHOL/HDL Ratio: 3
Triglycerides: 76 mg/dL (ref 0.0–149.0)
VLDL: 15.2 mg/dL (ref 0.0–40.0)

## 2017-07-12 NOTE — Patient Instructions (Signed)
WE NOW OFFER   [facility] FAST TRACK!!!  SAME DAY Appointments for ACUTE CARE  Such as: Sprains, Injuries, cuts, abrasions, rashes, muscle pain, joint pain, back pain Colds, flu, sore throats, headache, allergies, cough, fever  Ear pain, sinus and eye infections Abdominal pain, nausea, vomiting, diarrhea, upset stomach Animal/insect bites  3 Easy Ways to Schedule: Walk-In Scheduling Call in scheduling Mychart Sign-up: [email or web address]         

## 2017-07-12 NOTE — Progress Notes (Signed)
   Subjective:    Patient ID: Emily Ward, female    DOB: Jan 02, 1956, 61 y.o.   MRN: [id number]  HPI Here for a well exam. Her only issue to discuss is an intermittent sensation in her chest that she describes as a skipping or flopping. It lasts a few seconds to a few minutes. No chest pain. Sometimes she feels SOB with it and sometimes not. It is not related to exertion. She had a normal CXR in August.    Review of Systems  Constitutional: Negative.   HENT: Negative.   Eyes: Negative.   Respiratory: Negative.   Cardiovascular: Positive for palpitations. Negative for chest pain and leg swelling.  Gastrointestinal: Negative.   Genitourinary: Negative for decreased urine volume, difficulty urinating, dyspareunia, dysuria, enuresis, flank pain, frequency, hematuria, pelvic pain and urgency.  Musculoskeletal: Negative.   Skin: Negative.   Neurological: Negative.   Psychiatric/Behavioral: Negative.        Objective:   Physical Exam  Constitutional: She is oriented to person, place, and time. She appears well-developed and well-nourished. No distress.  HENT:  Head: Normocephalic and atraumatic.  Right Ear: External ear normal.  Left Ear: External ear normal.  Nose: Nose normal.  Mouth/Throat: Oropharynx is clear and moist. No oropharyngeal exudate.  Eyes: Conjunctivae and EOM are normal. Pupils are equal, round, and reactive to light. No scleral icterus.  Neck: Normal range of motion. Neck supple. No JVD present. No thyromegaly present.  Cardiovascular: Normal rate, regular rhythm, normal heart sounds and intact distal pulses. Exam reveals no gallop and no friction rub.  No murmur heard. Pulmonary/Chest: Effort normal and breath sounds normal. No respiratory distress. She has no wheezes. She has no rales. She exhibits no tenderness.  Abdominal: Soft. Bowel sounds are normal. She exhibits no distension and no mass. There is no tenderness. There is no rebound and no guarding.    Musculoskeletal: Normal range of motion. She exhibits no edema or tenderness.  Lymphadenopathy:    She has no cervical adenopathy.  Neurological: She is alert and oriented to person, place, and time. She has normal reflexes. No cranial nerve deficit. She exhibits normal muscle tone. Coordination normal.  Skin: Skin is warm and dry. No rash noted. No erythema.  Psychiatric: She has a normal mood and affect. Her behavior is normal. Judgment and thought content normal.          Assessment & Plan:  Well exam. We discussed diet and exercise. Get fasting labs. Refer to Cardiology for the palpitations at her request.  Alysia Penna, MD

## 2017-07-15 DIAGNOSIS — M542 Cervicalgia: Secondary | ICD-10-CM | POA: Diagnosis not present

## 2017-07-15 DIAGNOSIS — M545 Low back pain: Secondary | ICD-10-CM | POA: Diagnosis not present

## 2017-07-15 DIAGNOSIS — M797 Fibromyalgia: Secondary | ICD-10-CM | POA: Diagnosis not present

## 2017-07-23 DIAGNOSIS — M797 Fibromyalgia: Secondary | ICD-10-CM | POA: Diagnosis not present

## 2017-07-23 DIAGNOSIS — M545 Low back pain: Secondary | ICD-10-CM | POA: Diagnosis not present

## 2017-07-23 DIAGNOSIS — M542 Cervicalgia: Secondary | ICD-10-CM | POA: Diagnosis not present

## 2017-07-29 DIAGNOSIS — M542 Cervicalgia: Secondary | ICD-10-CM | POA: Diagnosis not present

## 2017-07-29 DIAGNOSIS — M797 Fibromyalgia: Secondary | ICD-10-CM | POA: Diagnosis not present

## 2017-07-29 DIAGNOSIS — M545 Low back pain: Secondary | ICD-10-CM | POA: Diagnosis not present

## 2017-07-30 ENCOUNTER — Encounter: Payer: Self-pay | Admitting: Cardiovascular Disease

## 2017-07-30 ENCOUNTER — Ambulatory Visit (INDEPENDENT_AMBULATORY_CARE_PROVIDER_SITE_OTHER): Payer: 59 | Admitting: Cardiovascular Disease

## 2017-07-30 DIAGNOSIS — R002 Palpitations: Secondary | ICD-10-CM

## 2017-07-30 DIAGNOSIS — R0602 Shortness of breath: Secondary | ICD-10-CM | POA: Insufficient documentation

## 2017-07-30 DIAGNOSIS — R0789 Other chest pain: Secondary | ICD-10-CM | POA: Diagnosis not present

## 2017-07-30 NOTE — Addendum Note (Signed)
Addended by: Therisa Doyne on: 07/30/2017 10:13 AM   Modules accepted: Orders

## 2017-07-30 NOTE — Progress Notes (Signed)
07/30/2017 Emily Ward   15-Oct-1955  [id number]  Primary Physician Laurey Morale, MD Primary Cardiologist: Lorretta Harp MD Emily Ward, [place], [place]  HPI:  Emily Ward is a 61 y.o. female married, mother of one son who was referred by Dr. Delma Freeze for cardiovascular evaluation because of atypical chest pain, palpitations and shortness of breath. 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 Risk factors include 50-pack-years of tobacco abuse having quit in 2015 as well as family history with a father who had a myocardial infarction in his 58s. She has never had a heart attack or stroke. She does not drink caffeine or caffeinated beverages. She is properly Based diet. She does have fibromyalgia. She is complaining of palpitations daily for quite a while as well as shortness of breath for a year that has been coming progressively worse. She also has some left precordial atypical chest pain.   Current Meds  Medication Sig  . aspirin 81 MG tablet Take 81 mg by mouth daily.    . butalbital-acetaminophen-caffeine (FIORICET, ESGIC) 50-325-40 MG tablet TAKE 1 TABLET BY MOUTH EVERY 6 HOURS AS NEEDED  . cetirizine (ZYRTEC) 10 MG tablet Take 10 mg by mouth as needed.   . chlorzoxazone (PARAFON) 500 MG tablet TAKE 1 TABLET BY MOUTH FOUR TIMES DAILY AS NEEDED FOR MUSCLE SPASMS  . cyclobenzaprine (FLEXERIL) 10 MG tablet Take 1 tablet (10 mg total) by mouth at bedtime. for muscle spams  . DULoxetine (CYMBALTA) 30 MG capsule TAKE 2 CAPSULES BY MOUTH EVERY MORNING  . estradiol-norethindrone (ACTIVELLA) 1-0.5 MG tablet Take 1 tablet daily by mouth.  . meloxicam (MOBIC) 7.5 MG tablet TAKE 1 TABLET(7.5 MG) BY MOUTH DAILY  . omega-3 acid ethyl esters (LOVAZA) 1 g capsule TAKE ONE CAPSULE BY MOUTH TWICE DAILY  . zolmitriptan (ZOMIG) 5 MG tablet Take 1 tablet (5 mg total) by mouth as needed.  . zolpidem (AMBIEN) 10 MG tablet TAKE 1 TABLET BY MOUTH EVERY DAY AT BEDTIME     Allergies    Allergen Reactions  . Depakote [Divalproex Sodium]     Drug inducted hepatitis    Social History   Socioeconomic History  . Marital status: Married    Spouse name: Not on file  . Number of children: Not on file  . Years of education: Not on file  . Highest education level: Not on file  Social Needs  . Financial resource strain: Not on file  . Food insecurity - worry: Not on file  . Food insecurity - inability: Not on file  . Transportation needs - medical: Not on file  . Transportation needs - non-medical: Not on file  Occupational History  . Not on file  Tobacco Use  . Smoking status: Former Smoker    Packs/day: 0.50    Years: 20.00    Pack years: 10.00    Types: Cigarettes    Last attempt to quit: 04/02/2013    Years since quitting: 4.3  . Smokeless tobacco: Never Used  Substance and Sexual Activity  . Alcohol use: Yes    Alcohol/week: 0.6 oz    Types: 1 Glasses of wine per week    Comment: occ  . Drug use: No  . Sexual activity: Yes    Partners: Male  Other Topics Concern  . Not on file  Social History Narrative  . Not on file     Review of Systems: General: negative for chills, fever, night sweats or  weight changes.  Cardiovascular: negative for chest pain, dyspnea on exertion, edema, orthopnea, palpitations, paroxysmal nocturnal dyspnea or shortness of breath Dermatological: negative for rash Respiratory: negative for cough or wheezing Urologic: negative for hematuria Abdominal: negative for nausea, vomiting, diarrhea, bright red blood per rectum, melena, or hematemesis Neurologic: negative for visual changes, syncope, or dizziness All other systems reviewed and are otherwise negative except as noted above.    Blood pressure 112/68, pulse 80, height 5\' 5"  (1.651 m), weight 110 lb (49.9 kg).  General appearance: alert and no distress Neck: no adenopathy, no carotid bruit, no JVD, supple, symmetrical, trachea midline and thyroid not enlarged, symmetric,  no tenderness/mass/nodules Lungs: clear to auscultation bilaterally Heart: regular rate and rhythm, S1, S2 normal, no murmur, click, rub or gallop Extremities: extremities normal, atraumatic, no cyanosis or edema Pulses: 2+ and symmetric Skin: Skin color, texture, turgor normal. No rashes or lesions Neurologic: Alert and oriented X 3, normal strength and tone. Normal symmetric reflexes. Normal coordination and gait  EKG sinus rhythm at 80 with an incomplete right bundle-branch block and nonspecific ST and T-wave changes. I personally reviewed this EKG.  ASSESSMENT AND PLAN:   Shortness of breath [name] was referred by Dr. Sarajane Jews for evaluation of progressive shortness of breath. She does have a 50-pack-year history of tobacco abuse having quit 3 years ago.I'm going to obtain a 2-D echocardiogram to further evaluate  Palpitations . Palpitations the last several months occurring on a daily basis lasting seconds at a time. TSH is normal. I'm going to the obtain a 30 day event monitor to further evaluate.  Atypical chest pain History of atypical chest pain which sounds more musculoskeletal however in the setting of shortness of breath, and family history of heart disease as well as a long history of tobacco abuse I am going to get a routine GXT to further evaluate. She does have nonspecific ST and T-wave changes on her baseline EKG however.      Lorretta Harp MD FACP,FACC,FAHA, [facility] 07/30/2017 10:02 AM

## 2017-07-30 NOTE — Assessment & Plan Note (Signed)
Emily Ward was referred by Dr. Sarajane Jews for evaluation of progressive shortness of breath. She does have a 50-pack-year history of tobacco abuse having quit 3 years ago.I'm going to obtain a 2-D echocardiogram to further evaluate

## 2017-07-30 NOTE — Assessment & Plan Note (Signed)
.   Palpitations the last several months occurring on a daily basis lasting seconds at a time. TSH is normal. I'm going to the obtain a 30 day event monitor to further evaluate.

## 2017-07-30 NOTE — Assessment & Plan Note (Signed)
History of atypical chest pain which sounds more musculoskeletal however in the setting of shortness of breath, and family history of heart disease as well as a long history of tobacco abuse I am going to get a routine GXT to further evaluate. She does have nonspecific ST and T-wave changes on her baseline EKG however.

## 2017-07-30 NOTE — Patient Instructions (Signed)
Medication Instructions: Your physician recommends that you continue on your current medications as directed. Please refer to the Current Medication list given to you today.  Testing/Procedures: Your physician has requested that you have an echocardiogram. Echocardiography is a painless test that uses sound waves to create images of your heart. It provides your doctor with information about the size and shape of your heart and how well your heart's chambers and valves are working. This procedure takes approximately one hour. There are no restrictions for this procedure.  Your physician has requested that you have an exercise tolerance test. For further information please visit [email or web address]. Please also follow instruction sheet, as given.  Your physician has recommended that you wear a 30 day event monitor. Event monitors are medical devices that record the heart's electrical activity. Doctors most often [place] these monitors to diagnose arrhythmias. Arrhythmias are problems with the speed or rhythm of the heartbeat. The monitor is a small, portable device. You can wear one while you do your normal daily activities. This is usually used to diagnose what is causing palpitations/syncope (passing out).  Follow-Up: Your physician recommends that you schedule a follow-up appointment in: after testing with Dr. Gwenlyn Found.

## 2017-08-01 ENCOUNTER — Telehealth (HOSPITAL_COMMUNITY): Payer: Self-pay

## 2017-08-01 NOTE — Telephone Encounter (Signed)
Encounter complete. 

## 2017-08-02 ENCOUNTER — Other Ambulatory Visit: Payer: Self-pay | Admitting: Cardiovascular Disease

## 2017-08-02 ENCOUNTER — Ambulatory Visit (HOSPITAL_COMMUNITY)
Admission: RE | Admit: 2017-08-02 | Discharge: 2017-08-02 | Disposition: A | Discharge status: Home / Self Care | Payer: 59 | Source: Ambulatory Visit | Attending: Cardiology | Admitting: Cardiology

## 2017-08-02 DIAGNOSIS — R0789 Other chest pain: Secondary | ICD-10-CM

## 2017-08-02 DIAGNOSIS — R0602 Shortness of breath: Secondary | ICD-10-CM

## 2017-08-02 DIAGNOSIS — R002 Palpitations: Secondary | ICD-10-CM | POA: Diagnosis present

## 2017-08-02 LAB — EXERCISE TOLERANCE TEST
Estimated workload: 6.5 METS
Exercise duration (min): 4 min
Exercise duration (sec): 38 s
MPHR: 162 {beats}/min
Peak HR: 134 {beats}/min
Percent HR: 82 %
RPE: 18
Rest HR: 78 {beats}/min

## 2017-08-05 ENCOUNTER — Ambulatory Visit (INDEPENDENT_AMBULATORY_CARE_PROVIDER_SITE_OTHER): Payer: 59 | Admitting: Family Medicine

## 2017-08-05 ENCOUNTER — Encounter: Payer: Self-pay | Admitting: Family Medicine

## 2017-08-05 VITALS — BP 104/80 | HR 71 | Temp 98.4°F | Wt 107.4 lb

## 2017-08-05 DIAGNOSIS — B029 Zoster without complications: Secondary | ICD-10-CM | POA: Diagnosis not present

## 2017-08-05 MED ORDER — METHYLPREDNISOLONE 4 MG PO TBPK: ORAL_TABLET | ORAL | 0 refills | Status: DC

## 2017-08-05 MED ORDER — VALACYCLOVIR HCL 1 G PO TABS
1000.0000 mg | ORAL_TABLET | Freq: Three times a day (TID) | ORAL | 0 refills | Status: DC
Start: 2017-08-05 — End: 2017-09-11

## 2017-08-05 NOTE — Progress Notes (Signed)
   Subjective:    Patient ID: Emily Ward, female    DOB: 08/05/1956, 61 y.o.   MRN: [id number]  HPI Here for 2 days of an itchy rash over the right neck and the right flank. No pain. She has felt tired and achy in general, but no fever.    Review of Systems  Constitutional: Negative.   Respiratory: Negative.   Cardiovascular: Negative.   Skin: Positive for rash.       Objective:   Physical Exam  Constitutional: She appears well-developed and well-nourished.  Cardiovascular: Normal rate, regular rhythm, normal heart sounds and intact distal pulses.  Pulmonary/Chest: Effort normal and breath sounds normal. No respiratory distress. She has no wheezes. She has no rales.  Skin:  Scattered red vesicles on the right lateral neck and upper right flank          Assessment & Plan:  Shingles, treat with Valtrex and a Medrol dose pack.  Alysia Penna, MD

## 2017-08-07 ENCOUNTER — Telehealth: Payer: Self-pay | Admitting: Family Medicine

## 2017-08-07 ENCOUNTER — Telehealth: Payer: Self-pay

## 2017-08-07 MED ORDER — ACYCLOVIR 800 MG PO TABS: 800.0000 mg | ORAL_TABLET | Freq: Three times a day (TID) | ORAL | 0 refills | Status: DC

## 2017-08-07 NOTE — Telephone Encounter (Signed)
Copied from [place] #[id number]. Topic: Quick Communication - See Telephone Encounter >> Aug 07, 2017  2:27 PM Cleaster Corin, NT wrote: CRM for notification. See Telephone encounter for:   08/07/17. Pt. Calling to ask what dosage of med. She needs to take. Pt. Seen dr. Sarajane Jews on Monday 12/3 and today the pharmacy notified pt. And saif the the same med. Was ready but a different dosage pt. Needs to know what to take. She has already started taking Acyclovir 1g (3x day) rx.for Acyclovir 800mg  was sent to pharmacy today and filled. Please give pt. A call on what to do. Pt. Can be reached at [place], [place] [address] Phone: [phone] Fax: [phone] Not a 24 hour pharmacy; exact hours not known

## 2017-08-07 NOTE — Telephone Encounter (Signed)
Fax came in from pt's pharmacy:  Valacyclovir 1 GM tablets   This durg is NOT covered by pt's plan. The preferred alternative is ACYCLOVIRTABMG. Please call/fax the pharmacy to change medication along with strength and directions, quantity, and refills.

## 2017-08-07 NOTE — Telephone Encounter (Signed)
Change the order to Acyclovir 800 mg tid for 10 days

## 2017-08-08 NOTE — Telephone Encounter (Signed)
Called pt and left a VM to call back.  

## 2017-08-08 NOTE — Telephone Encounter (Signed)
Spoke to pt advised her that her pharmacy reached out to [place] stating that her insurance does not cover the Valacyclovir so we sent in the acyclovir. Pt stated that this is not true her insurance did cover for the valacyclovir. Pt advised that it is OK to continue the valacyclovir and she can tell her pharmacy to deleted the acyclovir. Acyclovir deleted from pt's chart.

## 2017-08-12 ENCOUNTER — Other Ambulatory Visit (HOSPITAL_COMMUNITY): Payer: 59

## 2017-08-13 ENCOUNTER — Other Ambulatory Visit (HOSPITAL_COMMUNITY): Payer: 59

## 2017-08-16 ENCOUNTER — Ambulatory Visit: Payer: 59 | Admitting: Cardiovascular Disease

## 2017-08-16 DIAGNOSIS — M545 Low back pain: Secondary | ICD-10-CM | POA: Diagnosis not present

## 2017-08-16 DIAGNOSIS — M797 Fibromyalgia: Secondary | ICD-10-CM | POA: Diagnosis not present

## 2017-08-16 DIAGNOSIS — M542 Cervicalgia: Secondary | ICD-10-CM | POA: Diagnosis not present

## 2017-08-18 ENCOUNTER — Other Ambulatory Visit: Payer: Self-pay | Admitting: Family Medicine

## 2017-08-18 DIAGNOSIS — R0789 Other chest pain: Secondary | ICD-10-CM

## 2017-08-19 DIAGNOSIS — M797 Fibromyalgia: Secondary | ICD-10-CM | POA: Diagnosis not present

## 2017-08-19 DIAGNOSIS — M545 Low back pain: Secondary | ICD-10-CM | POA: Diagnosis not present

## 2017-08-19 DIAGNOSIS — M542 Cervicalgia: Secondary | ICD-10-CM | POA: Diagnosis not present

## 2017-08-19 NOTE — Telephone Encounter (Signed)
Sent to PCP for approval.  

## 2017-08-21 ENCOUNTER — Other Ambulatory Visit: Payer: Self-pay

## 2017-08-21 ENCOUNTER — Ambulatory Visit (INDEPENDENT_AMBULATORY_CARE_PROVIDER_SITE_OTHER): Payer: 59

## 2017-08-21 ENCOUNTER — Ambulatory Visit (HOSPITAL_COMMUNITY): Payer: 59 | Attending: Internal Medicine

## 2017-08-21 DIAGNOSIS — R0789 Other chest pain: Secondary | ICD-10-CM | POA: Diagnosis not present

## 2017-08-21 DIAGNOSIS — R0602 Shortness of breath: Secondary | ICD-10-CM

## 2017-08-21 DIAGNOSIS — R002 Palpitations: Secondary | ICD-10-CM | POA: Insufficient documentation

## 2017-08-28 ENCOUNTER — Telehealth (HOSPITAL_COMMUNITY): Payer: Self-pay

## 2017-08-28 NOTE — Telephone Encounter (Signed)
Encounter complete. 

## 2017-08-29 ENCOUNTER — Ambulatory Visit (HOSPITAL_COMMUNITY)
Admission: RE | Admit: 2017-08-29 | Discharge: 2017-08-29 | Disposition: A | Discharge status: Home / Self Care | Payer: 59 | Source: Ambulatory Visit | Attending: Cardiology | Admitting: Cardiology

## 2017-08-29 DIAGNOSIS — I341 Nonrheumatic mitral (valve) prolapse: Secondary | ICD-10-CM | POA: Insufficient documentation

## 2017-08-29 DIAGNOSIS — R9431 Abnormal electrocardiogram [ECG] [EKG]: Secondary | ICD-10-CM | POA: Diagnosis not present

## 2017-08-29 DIAGNOSIS — R0602 Shortness of breath: Secondary | ICD-10-CM | POA: Insufficient documentation

## 2017-08-29 DIAGNOSIS — Z8249 Family history of ischemic heart disease and other diseases of the circulatory system: Secondary | ICD-10-CM | POA: Insufficient documentation

## 2017-08-29 DIAGNOSIS — R002 Palpitations: Secondary | ICD-10-CM | POA: Diagnosis not present

## 2017-08-29 DIAGNOSIS — Z87891 Personal history of nicotine dependence: Secondary | ICD-10-CM | POA: Insufficient documentation

## 2017-08-29 DIAGNOSIS — R0789 Other chest pain: Secondary | ICD-10-CM

## 2017-08-29 LAB — MYOCARDIAL PERFUSION IMAGING
LV dias vol: 74 mL (ref 46–106)
LV sys vol: 28 mL
Peak HR: 101 {beats}/min
Rest HR: 65 {beats}/min
SDS: 0
SRS: 0
SSS: 0
TID: 1.07

## 2017-08-29 MED ORDER — TECHNETIUM TC 99M TETROFOSMIN IV KIT
10.4000 | PACK | Freq: Once | INTRAVENOUS | Status: AC | PRN
  Administered 2017-08-29: 10.4 via INTRAVENOUS
  Filled 2017-08-29: qty 11

## 2017-08-29 MED ORDER — REGADENOSON 0.4 MG/5ML IV SOLN
0.4000 mg | Freq: Once | INTRAVENOUS | Status: AC
  Administered 2017-08-29: 0.4 mg via INTRAVENOUS

## 2017-08-29 MED ORDER — TECHNETIUM TC 99M TETROFOSMIN IV KIT
31.3000 | PACK | Freq: Once | INTRAVENOUS | Status: AC | PRN
  Administered 2017-08-29: 31.3 via INTRAVENOUS
  Filled 2017-08-29: qty 32

## 2017-09-06 ENCOUNTER — Other Ambulatory Visit: Payer: Self-pay | Admitting: Rheumatology

## 2017-09-06 DIAGNOSIS — M797 Fibromyalgia: Secondary | ICD-10-CM | POA: Diagnosis not present

## 2017-09-06 DIAGNOSIS — M542 Cervicalgia: Secondary | ICD-10-CM | POA: Diagnosis not present

## 2017-09-06 DIAGNOSIS — M545 Low back pain: Secondary | ICD-10-CM | POA: Diagnosis not present

## 2017-09-06 NOTE — Telephone Encounter (Signed)
Last visit: 07/04/17 Next visit: 01/03/18  Okay to refill per Dr. Estanislado Pandy

## 2017-09-10 ENCOUNTER — Other Ambulatory Visit: Payer: Self-pay | Admitting: Family Medicine

## 2017-09-10 NOTE — Telephone Encounter (Signed)
Call in #120 with 5 rf 

## 2017-09-10 NOTE — Telephone Encounter (Signed)
Last OV 08/05/2017. Rx was last refilled 03/22/2017 disp 120 with 5 refills. Sent to PCP for approval.

## 2017-09-11 ENCOUNTER — Encounter: Payer: Self-pay | Admitting: Cardiovascular Disease

## 2017-09-11 ENCOUNTER — Ambulatory Visit (INDEPENDENT_AMBULATORY_CARE_PROVIDER_SITE_OTHER): Payer: 59 | Admitting: Cardiovascular Disease

## 2017-09-11 DIAGNOSIS — R0602 Shortness of breath: Secondary | ICD-10-CM | POA: Diagnosis not present

## 2017-09-11 DIAGNOSIS — R002 Palpitations: Secondary | ICD-10-CM | POA: Diagnosis not present

## 2017-09-11 DIAGNOSIS — R0789 Other chest pain: Secondary | ICD-10-CM | POA: Diagnosis not present

## 2017-09-11 NOTE — Assessment & Plan Note (Signed)
History of shortness of breath probably COPD with recent normal 2-D echocardiogram.

## 2017-09-11 NOTE — Assessment & Plan Note (Signed)
History palpitations currently wearing a monitor and has only shown occasional PACs.

## 2017-09-11 NOTE — Progress Notes (Signed)
Ms. Hise returns today for follow-up of her outpatient diagnostic tests. Her Myoview was completely normal as was a 2-D echocardiogram. Monitor only showed PACs. I suspect that her chest pain is radicular/musculoskeletal and her shortness of breath related to COPD. No further workup is necessary at this time and I will see her back when necessary.  Lorretta Harp, M.D., [place], [facility], [place] [address] [place], [place]  [id number]  [phone] 09/11/2017 3:55 PM

## 2017-09-11 NOTE — Patient Instructions (Signed)
Medication Instructions: Your physician recommends that you continue on your current medications as directed. Please refer to the Current Medication list given to you today.   Follow-Up: Your physician recommends that you schedule a follow-up appointment as needed with Dr. Berry.    

## 2017-09-11 NOTE — Assessment & Plan Note (Signed)
Atypical chest pain with recent negative Myoview stress test. The pain sounds more radicular/musculoskeletal.

## 2017-09-13 DIAGNOSIS — M545 Low back pain: Secondary | ICD-10-CM | POA: Diagnosis not present

## 2017-09-13 DIAGNOSIS — M797 Fibromyalgia: Secondary | ICD-10-CM | POA: Diagnosis not present

## 2017-09-13 DIAGNOSIS — M542 Cervicalgia: Secondary | ICD-10-CM | POA: Diagnosis not present

## 2017-09-20 DIAGNOSIS — M542 Cervicalgia: Secondary | ICD-10-CM | POA: Diagnosis not present

## 2017-09-20 DIAGNOSIS — M797 Fibromyalgia: Secondary | ICD-10-CM | POA: Diagnosis not present

## 2017-09-20 DIAGNOSIS — M545 Low back pain: Secondary | ICD-10-CM | POA: Diagnosis not present

## 2017-09-27 ENCOUNTER — Other Ambulatory Visit: Payer: Self-pay

## 2017-09-27 ENCOUNTER — Encounter: Payer: Self-pay | Admitting: Obstetrics & Gynecology

## 2017-09-27 ENCOUNTER — Ambulatory Visit (INDEPENDENT_AMBULATORY_CARE_PROVIDER_SITE_OTHER): Payer: 59 | Admitting: Obstetrics & Gynecology

## 2017-09-27 VITALS — BP 100/60 | HR 88 | Resp 18 | Ht 65.0 in | Wt 107.0 lb

## 2017-09-27 DIAGNOSIS — M8580 Other specified disorders of bone density and structure, unspecified site: Secondary | ICD-10-CM | POA: Diagnosis not present

## 2017-09-27 DIAGNOSIS — Z01419 Encounter for gynecological examination (general) (routine) without abnormal findings: Secondary | ICD-10-CM

## 2017-09-27 MED ORDER — ESTRADIOL-NORETHINDRONE ACET 1-0.5 MG PO TABS: 1.0000 | ORAL_TABLET | Freq: Every day | ORAL | 4 refills | Status: DC

## 2017-09-27 MED ORDER — MEDROXYPROGESTERONE ACETATE 5 MG PO TABS: 5.0000 mg | ORAL_TABLET | Freq: Every day | ORAL | 4 refills | Status: DC

## 2017-09-27 NOTE — Patient Instructions (Signed)
Please schedule a bone density with your mammogram which will be due in late October, 2019.

## 2017-09-27 NOTE — Progress Notes (Signed)
62 y.o. G2P2 MarriedCaucasianF here for annual exam.  Reports having some SOB.  D/w Dr. Sarajane Jews last year.  Had CXR which was negative.  Had some atypical chest pain this year.  Saw cardiologist.  Had myocardial perfusion test.  Had shingles in December.    Did have some spotting again last month.  Has daily discharge.  Patient's last menstrual period was 09/03/2000 (approximate).          Sexually active: No.  The current method of family planning is post menopausal status.  Exercising: Yes.    walking, yoga Smoker:  no  Health Maintenance: Pap:  01/18/17 Neg.   05/05/15 Neg History of abnormal Pap:  yes MMG:  06/04/17 BIRADS1:Neg  Colonoscopy:  06/06/12 f/u 10 years  BMD:   2014 Osteopenia  TDaP:  2010 Pneumonia vaccine(s):  n/a Shingrix:   No Hep C testing: 09/25/16 Neg Screening Labs: PCP   reports that she quit smoking about 4 years ago. Her smoking use included cigarettes. She has a 10.00 pack-year smoking history. she has never used smokeless tobacco. She reports that she drinks about 0.6 oz of alcohol per week. She reports that she does not use drugs.  Past Medical History:  Diagnosis Date  . Cervical disc disease 2006  . 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    . Fibromyalgia    sees Dr. Estanislado Pandy   . Heart murmur   . 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 Hyperlipidemia   . Migraines   . MVP (mitral valve prolapse)    no daily medication  . Peptic ulcer    no problems    Past Surgical History:  Procedure Laterality Date  . CERVICAL LAMINECTOMY  2006  . CESAREAN SECTION  1993  . CHOLECYSTECTOMY  06/2002  . COLONOSCOPY  06-06-12   per Dr. Olevia Perches, hyperplastic polyps, repeat in 10 yrs   . DILATATION & CURETTAGE/HYSTEROSCOPY WITH MYOSURE N/A 04/12/2017   Procedure: DILATATION & CURETTAGE/HYSTEROSCOPY;  Surgeon: Megan Salon, MD;  Location: [facility];  Service: Gynecology;  Laterality: N/A;  .  HYSTEROSCOPY     years ago with Dr Radene Knee-? 2000  . TUBAL LIGATION      Current Outpatient Medications  Medication Sig Dispense Refill  . aspirin 81 MG tablet Take 81 mg by mouth daily.      . butalbital-acetaminophen-caffeine (FIORICET, ESGIC) 50-325-40 MG tablet TAKE 1 TABLET BY MOUTH EVERY 6 HOURS AS NEEDED 120 tablet 5  . cetirizine (ZYRTEC) 10 MG tablet Take 10 mg by mouth as needed.     . chlorzoxazone (PARAFON) 500 MG tablet TAKE 1 TABLET BY MOUTH FOUR TIMES DAILY AS NEEDED FOR MUSCLE SPASMS 120 tablet 5  . cyclobenzaprine (FLEXERIL) 10 MG tablet Take 1 tablet (10 mg total) by mouth at bedtime. for muscle spams 30 tablet 5  . DULoxetine (CYMBALTA) 30 MG capsule TAKE 2 CAPSULES BY MOUTH EVERY MORNING 180 capsule 0  . estradiol-norethindrone (LOPREEZA) 1-0.5 MG tablet Take 1 tablet by mouth daily. 90 tablet 4  . meloxicam (MOBIC) 7.5 MG tablet TAKE 1 TABLET(7.5 MG) BY MOUTH DAILY 90 tablet 3  . zolmitriptan (ZOMIG) 5 MG tablet Take 1 tablet (5 mg total) by mouth as needed. 30 tablet 3  . zolpidem (AMBIEN) 10 MG tablet TAKE 1 TABLET BY MOUTH EVERY DAY AT BEDTIME 90 tablet 1  . medroxyPROGESTERone (PROVERA) 5 MG tablet Take 1 tablet (5 mg total)  by mouth daily. 90 tablet 4   No current facility-administered medications for this visit.     Family History  Problem Relation Age of Onset  . Breast cancer Mother        lumpectomy in late 50/60  . Heart attack Father   . Congestive Heart Failure Father   . Cancer Unknown        breast/fhx  . Heart disease Unknown        fhx  . Diabetes type II Sister        and ? brother  . Arthritis Son        psoriatic  . Colon cancer Neg Hx   . Esophageal cancer Neg Hx   . Rectal cancer Neg Hx   . Stomach cancer Neg Hx     ROS:  Pertinent items are noted in HPI.  Otherwise, a comprehensive ROS was negative.  Exam:   BP 100/60 (BP Location: Right Arm, Patient Position: Sitting, Cuff Size: Normal)   Pulse 88   Resp 18   Ht 5\' 5"  (1.651  m)   Wt 107 lb (48.5 kg)   LMP 09/03/2000 (Approximate)   BMI 17.81 kg/m   Weight change: -8# Height: 5\' 5"  (165.1 cm)  Ht Readings from Last 3 Encounters:  09/27/17 5\' 5"  (1.651 m)  09/11/17 5\' 5"  (1.651 m)  08/29/17 5\' 5"  (1.651 m)    General appearance: alert, cooperative and appears stated age Head: Normocephalic, without obvious abnormality, atraumatic Neck: no adenopathy, supple, symmetrical, trachea midline and thyroid normal to inspection and palpation Lungs: clear to auscultation bilaterally Breasts: normal appearance, no masses or tenderness Heart: regular rate and rhythm Abdomen: soft, non-tender; bowel sounds normal; no masses,  no organomegaly Extremities: extremities normal, atraumatic, no cyanosis or edema Skin: Skin color, texture, turgor normal. No rashes or lesions Lymph nodes: Cervical, supraclavicular, and axillary nodes normal. No abnormal inguinal nodes palpated Neurologic: Grossly normal   Pelvic: External genitalia:  no lesions              Urethra:  normal appearing urethra with no masses, tenderness or lesions              Bartholins and Skenes: normal                 Vagina: normal appearing vagina with normal color and discharge, no lesions              Cervix: no lesions              Pap taken: no Bimanual Exam:  Uterus:  normal size, contour, position, consistency, mobility, non-tender              Adnexa: normal adnexa and no mass, fullness, tenderness               Rectovaginal: Confirms               Anus:  normal sphincter tone, no lesions  Chaperone was present for exam.  A:  Well Woman with normal exam PMP, no HRT History of Depakote induced hepatitis H/O fibromyalgia Interstitial cystitis  P:   Mammogram guidelines reviewed.  Doing yearly 3D MMG pap smear 5/18 negative, neg HR HPV 9/15 Lab work done with Dr. Kelly Splinter HRT (1.0/0.5mg  daily).  Rx to pharmacy for 90 day supply.  Pt will give update in 2 months.  Consider adding  provera if spotting continues.  Will add provera 5mg  daily.  #90/4RF Return annually  or prn

## 2017-09-28 ENCOUNTER — Other Ambulatory Visit: Payer: Self-pay | Admitting: Family Medicine

## 2017-10-04 ENCOUNTER — Encounter: Payer: Self-pay | Admitting: Family Medicine

## 2017-10-04 ENCOUNTER — Ambulatory Visit (INDEPENDENT_AMBULATORY_CARE_PROVIDER_SITE_OTHER): Payer: 59 | Admitting: Family Medicine

## 2017-10-04 VITALS — BP 110/64 | HR 72 | Temp 98.2°F | Wt 107.8 lb

## 2017-10-04 DIAGNOSIS — J439 Emphysema, unspecified: Secondary | ICD-10-CM | POA: Diagnosis not present

## 2017-10-04 MED ORDER — ALBUTEROL SULFATE HFA 108 (90 BASE) MCG/ACT IN AERS
2.0000 | INHALATION_SPRAY | RESPIRATORY_TRACT | 5 refills | Status: DC | PRN
Start: 2017-10-04 — End: 2018-09-08

## 2017-10-04 NOTE — Progress Notes (Signed)
   Subjective:    Patient ID: Emily Ward, female    DOB: 14-Dec-1955, 62 y.o.   MRN: [id number]  HPI Here tofollow up on SOB on exertion. She recently had a full cardiac workup and everything came out fine, including monitors and an ECHO. It was felt she has some early COPD, and I agree. She is a former smoker, and she has a family hx of emphysema. She is comfortable at rest but with exertion, such as walking up steps, she becomes SOB. No chest pain. Her last CXR in August 2018 was clear.    Review of Systems  Constitutional: Negative.   Respiratory: Positive for shortness of breath. Negative for cough and wheezing.   Cardiovascular: Negative.        Objective:   Physical Exam  Constitutional: She appears well-developed and well-nourished.  Neck: No thyromegaly present.  Cardiovascular: Normal rate, regular rhythm, normal heart sounds and intact distal pulses.  Pulmonary/Chest: Effort normal and breath sounds normal. No respiratory distress. She has no wheezes. She has no rales.  Lymphadenopathy:    She has no cervical adenopathy.          Assessment & Plan:  Early COPD. Try an albuterol inhaler prn. She plans to exercise regularly as well.  Alysia Penna, MD

## 2017-10-09 ENCOUNTER — Other Ambulatory Visit: Payer: Self-pay | Admitting: Family Medicine

## 2017-10-10 NOTE — Telephone Encounter (Signed)
Last OV 10/04/2017  Last refilled 03/13/2017 disp 90 with 1 refill   Sent to PCP for approval

## 2017-10-11 NOTE — Telephone Encounter (Signed)
Rx has been called into pharmacy  

## 2017-10-11 NOTE — Telephone Encounter (Signed)
Refill for 6 months. 

## 2017-10-18 DIAGNOSIS — M797 Fibromyalgia: Secondary | ICD-10-CM | POA: Diagnosis not present

## 2017-10-18 DIAGNOSIS — M545 Low back pain: Secondary | ICD-10-CM | POA: Diagnosis not present

## 2017-10-18 DIAGNOSIS — M542 Cervicalgia: Secondary | ICD-10-CM | POA: Diagnosis not present

## 2017-10-25 ENCOUNTER — Telehealth: Payer: Self-pay | Admitting: Obstetrics & Gynecology

## 2017-10-25 NOTE — Telephone Encounter (Signed)
Routing to [place] for review and advise of alternative recommendations. Potentially Combipatch or estrogen patch with oral progesterone?

## 2017-10-25 NOTE — Telephone Encounter (Signed)
Patient called and said her pharmacy said Maury Dus is not in stock until November 30, 2017. She does not have enough left to last until then. She'd tried several pharmacies to no avail and is now requesting advice on how best to proceed.

## 2017-10-25 NOTE — Telephone Encounter (Signed)
She could take 1mg  estradiol and 1/2 tab of norethindrone 5mg  tablet daily until the Trevose is back in stock.  Ok to call into pharmacy if she is ok with this.  Can you see if she's still having spotting?

## 2017-10-28 NOTE — Telephone Encounter (Signed)
Called patient and left message on voicemail to return my call. 

## 2017-10-28 NOTE — Telephone Encounter (Signed)
Spoke with patient and notified her of Dr.Miller's recommendations below. She states we could send in Rx for Estradiol 1mg  and Norethindrone 5mg  but she has been taking 1 Lopreza 1/0.5mg  and 1 Provera 5mg  and that seems to have slowed down the bleeding. Advised will discuss with Dr.Miller exactly how she wants patient to take Provera and Norethindrone and call her back. Patient states the regimen she is on now is really working well this week! Will call her back. Routed to [facility] is [facility].(pls route back to triage)

## 2017-10-28 NOTE — Telephone Encounter (Signed)
Just have her take estradiol 1mg  and provera 5mg  daily.  #30 each to pharmacy.  It wasn't clear from the note--is she still spotting?  Thanks.

## 2017-10-30 MED ORDER — MEDROXYPROGESTERONE ACETATE 5 MG PO TABS: 5.0000 mg | ORAL_TABLET | Freq: Every day | ORAL | 0 refills | Status: DC

## 2017-10-30 MED ORDER — ESTRADIOL 1 MG PO TABS: 1.0000 mg | ORAL_TABLET | Freq: Every day | ORAL | 0 refills | Status: DC

## 2017-10-30 NOTE — Telephone Encounter (Signed)
Spoke with patient and she states no bleeding x1 week. Advised will send Rx for Estradiol 1mg  and Provera 5mg  #30 each to pharmacy per Dr.Miller's note. Routed to [facility]

## 2017-11-01 DIAGNOSIS — M545 Low back pain: Secondary | ICD-10-CM | POA: Diagnosis not present

## 2017-11-01 DIAGNOSIS — M797 Fibromyalgia: Secondary | ICD-10-CM | POA: Diagnosis not present

## 2017-11-01 DIAGNOSIS — M542 Cervicalgia: Secondary | ICD-10-CM | POA: Diagnosis not present

## 2017-11-10 ENCOUNTER — Other Ambulatory Visit: Payer: Self-pay | Admitting: Family Medicine

## 2017-11-11 NOTE — Telephone Encounter (Signed)
Last OV   Last refilled 07/02/2016 disp 30 with 5 refills   Sent to PCP for approval

## 2017-11-22 DIAGNOSIS — M542 Cervicalgia: Secondary | ICD-10-CM | POA: Diagnosis not present

## 2017-11-22 DIAGNOSIS — M545 Low back pain: Secondary | ICD-10-CM | POA: Diagnosis not present

## 2017-11-22 DIAGNOSIS — M797 Fibromyalgia: Secondary | ICD-10-CM | POA: Diagnosis not present

## 2017-11-24 ENCOUNTER — Other Ambulatory Visit: Payer: Self-pay | Admitting: Obstetrics & Gynecology

## 2017-11-24 ENCOUNTER — Other Ambulatory Visit: Payer: Self-pay | Admitting: Rheumatology

## 2017-11-25 NOTE — Telephone Encounter (Signed)
Last visit: 07/04/17 Next visit: 01/03/18  Okay to refill per Dr. Estanislado Pandy

## 2017-11-25 NOTE — Telephone Encounter (Signed)
Medication refill request: Estradiol  Last AEX:  09-27-17  Next AEX: 12-05-18  Last MMG (if hormonal medication request): 06-04-17 WNL  Refill authorized: please advise

## 2017-12-06 DIAGNOSIS — M542 Cervicalgia: Secondary | ICD-10-CM | POA: Diagnosis not present

## 2017-12-06 DIAGNOSIS — M545 Low back pain: Secondary | ICD-10-CM | POA: Diagnosis not present

## 2017-12-06 DIAGNOSIS — M797 Fibromyalgia: Secondary | ICD-10-CM | POA: Diagnosis not present

## 2017-12-13 ENCOUNTER — Ambulatory Visit
Admission: RE | Admit: 2017-12-13 | Discharge: 2017-12-13 | Disposition: A | Discharge status: Home / Self Care | Payer: 59 | Source: Ambulatory Visit | Attending: Obstetrics & Gynecology | Admitting: Obstetrics & Gynecology

## 2017-12-13 DIAGNOSIS — Z780 Asymptomatic menopausal state: Secondary | ICD-10-CM | POA: Diagnosis not present

## 2017-12-13 DIAGNOSIS — M810 Age-related osteoporosis without current pathological fracture: Secondary | ICD-10-CM | POA: Diagnosis not present

## 2017-12-13 DIAGNOSIS — M8580 Other specified disorders of bone density and structure, unspecified site: Secondary | ICD-10-CM

## 2017-12-16 ENCOUNTER — Telehealth: Payer: Self-pay | Admitting: *Deleted

## 2017-12-16 DIAGNOSIS — M810 Age-related osteoporosis without current pathological fracture: Secondary | ICD-10-CM

## 2017-12-16 NOTE — Telephone Encounter (Signed)
Notes recorded by Burnice Logan, RN on 12/16/2017 at 11:09 AM EDT Left message to call Sharee Pimple at [phone].

## 2017-12-16 NOTE — Telephone Encounter (Signed)
Spoke with patient, advised of results and recommendations as seen below per Dr. Sabra Heck. Patient accepts referral to Dr. Chalmers Cater. Advised patient our office referral coordinator will f/u with scheduling. Patient verbalizes understanding and is agreeable.   Order placed for ambulatory referral to endocrinology, Dr. Chalmers Cater.   Routing to [employer] . Will close encounter.   Cc: Dr. Sabra Heck

## 2017-12-16 NOTE — Telephone Encounter (Signed)
Return call to Jill. °

## 2017-12-16 NOTE — Telephone Encounter (Signed)
-----   Message from Megan Salon, MD sent at 12/15/2017  6:32 PM EDT ----- Please let pt know her bone density showed osteoporosis.  This is significant for her age.  I would like her to see Dr. Chalmers Cater, endocrinology, as this is more severe than I would expect given her age.  Ok to place referral.

## 2017-12-21 ENCOUNTER — Other Ambulatory Visit: Payer: Self-pay | Admitting: Family Medicine

## 2017-12-23 NOTE — Progress Notes (Signed)
Office Visit Note  Patient: Emily Ward             Date of Birth: 1956/06/07           MRN: [id number]             PCP: Laurey Morale, MD Referring: Laurey Morale, MD Visit Date: 01/03/2018 Occupation: @GUAROCC @    Subjective:  Neck pain.   History of Present Illness: Emily Ward is a 62 y.o. female with history of fibromyalgia, disc disease.  She states for the last 2 weeks she has been having discomfort in her C-spine. The pain neck pain is started 2 weeks ago with severe discomfort and stiffness which is eased off over time.  She still have some discomfort in her C-spine which radiates into her left shoulder.  She states she had recent bone density which showed osteoporosis and she is an appointment coming up with an endocrinologist.  Activities of Daily Living:  Patient reports morning stiffness for 0 minute.   Patient Denies nocturnal pain.  Difficulty dressing/grooming: Denies Difficulty climbing stairs: Denies Difficulty getting out of chair: Denies Difficulty using hands for taps, buttons, cutlery, and/or writing: Denies   Review of Systems  Constitutional: Negative for fatigue, night sweats, weight gain and weight loss.  HENT: Positive for mouth dryness. Negative for mouth sores, trouble swallowing, trouble swallowing and nose dryness.   Eyes: Negative for pain, redness, visual disturbance and dryness.  Respiratory: Positive for shortness of breath. Negative for cough and difficulty breathing.        Former smoker, cardiology work up negative  Cardiovascular: Negative for chest pain, palpitations, hypertension, irregular heartbeat and swelling in legs/feet.  Gastrointestinal: Negative for blood in stool, constipation and diarrhea.  Endocrine: Negative for increased urination.  Genitourinary: Negative for vaginal dryness.  Musculoskeletal: Positive for arthralgias and joint pain. Negative for joint swelling, myalgias, muscle weakness, morning stiffness,  muscle tenderness and myalgias.  Skin: Negative for color change, rash, hair loss, skin tightness, ulcers and sensitivity to sunlight.  Allergic/Immunologic: Negative for susceptible to infections.  Neurological: Negative for dizziness, memory loss, night sweats and weakness.  Hematological: Negative for swollen glands.  Psychiatric/Behavioral: Positive for sleep disturbance. Negative for depressed mood. The patient is not nervous/anxious.     PMFS History:  Patient Active Problem List   Diagnosis Date Noted  . 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 Palpitations 07/30/2017  . Shortness of breath 07/30/2017  . Hormone replacement therapy (HRT) 03/30/2017  . Former smoker 03/30/2017  . Other fatigue 06/30/2016  . Insomnia 06/30/2016  . DJD (degenerative joint disease), cervical 06/30/2016  . 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 IBS (irritable bowel syndrome) 06/30/2016  . Interstitial cystitis 06/30/2016  . SHOULDER PAIN, RIGHT 09/05/2010  . GERD 03/24/2009  . SACROILIAC STRAIN 03/24/2009  . Atypical chest pain 01/20/2008  . HEMATURIA UNSPECIFIED 10/14/2007  . FIBROMYALGIA 07/03/2007  . HYPERLIPIDEMIA NEC/NOS 05/16/2007  . HEADACHE 05/09/2007    Past Medical History:  Diagnosis Date  . Cervical disc disease 2006  . 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    . Fibromyalgia    sees Dr. Estanislado Pandy   . Heart murmur   . 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 Hyperlipidemia   . Migraines   . MVP (mitral valve prolapse)    no daily medication  . Peptic ulcer    no  problems    Family History  Problem Relation Age of Onset  . Breast cancer Mother        lumpectomy in late 50/60  . Heart attack Father   . Congestive Heart Failure Father   . Cancer Unknown        breast/fhx  . Heart disease Unknown        fhx  . Diabetes type II Sister        and ? brother  . Arthritis Son         psoriatic  . Colon cancer Neg Hx   . Esophageal cancer Neg Hx   . Rectal cancer Neg Hx   . Stomach cancer Neg Hx    Past Surgical History:  Procedure Laterality Date  . CERVICAL LAMINECTOMY  2006  . CESAREAN SECTION  1993  . CHOLECYSTECTOMY  06/2002  . COLONOSCOPY  06-06-12   per Dr. Olevia Perches, hyperplastic polyps, repeat in 10 yrs   . DILATATION & CURETTAGE/HYSTEROSCOPY WITH MYOSURE N/A 04/12/2017   Procedure: DILATATION & CURETTAGE/HYSTEROSCOPY;  Surgeon: Megan Salon, MD;  Location: [facility];  Service: Gynecology;  Laterality: N/A;  . HYSTEROSCOPY     years ago with Dr Radene Knee-? 2000  . TUBAL LIGATION     Social History   Social History Narrative  . Not on file     Objective: Vital Signs: BP 109/70 (BP Location: Left Arm, Patient Position: Sitting, Cuff Size: Normal)   Pulse 76   Resp 16   Ht 5\' 5"  (1.651 m)   Wt 108 lb (49 kg)   LMP 09/03/2000 (Approximate)   BMI 17.97 kg/m    Physical Exam  Constitutional: She is oriented to person, place, and time. She appears well-developed and well-nourished.  HENT:  Head: Normocephalic and atraumatic.  Eyes: Conjunctivae and EOM are normal.  Neck: Normal range of motion.  Cardiovascular: Normal rate, regular rhythm, normal heart sounds and intact distal pulses.  Pulmonary/Chest: Effort normal and breath sounds normal.  Abdominal: Soft. Bowel sounds are normal.  Lymphadenopathy:    She has no cervical adenopathy.  Neurological: She is alert and oriented to person, place, and time.  Skin: Skin is warm and dry. Capillary refill takes less than 2 seconds.  Psychiatric: She has a normal mood and affect. Her behavior is normal.  Nursing note and vitals reviewed.    Musculoskeletal Exam: C-spine limited range of motion with discomfort.  She had discomfort with left lateral rotation.  Thoracic and lumbar spine with good range of motion with no discomfort.  Shoulder joints, elbow joints, wrist joints, MCPs PIPs and  DIPs were in good range of motion with no synovitis.  Hip joints knee joints ankles MTPs PIPs been good range of motion with no synovitis.  She has some generalized hyperalgesia.  CDAI Exam: No CDAI exam completed.    Investigation: No additional findings.  December 13, 2017 T score -3.9 Imaging: Dg Bone Density  Result Date: 12/13/2017 EXAM: DUAL X-RAY ABSORPTIOMETRY (DXA) FOR BONE MINERAL DENSITY IMPRESSION: Referring Physician:  Lemmie Evens MILLER PATIENT: Name: Emily Ward, Emily Ward Patient ID:  [id number] Birth Date: 31-Dec-1955 Height:     64.5 in. Sex: Female Measured: 12/13/2017 Weight: 107.8 lbs. Indications: Caucasian, cymbalta, Estrogen Deficient, Height Loss (781.91), Low Body Weight (783.22), Postmenopausal Fractures: None Treatments: Estrace tablet, Vitamin D (E933.5) ASSESSMENT: The BMD measured at Femur Total Left is 0.516 g/cm2 with a T-score of -3.9. This patient is considered OSTEOPOROTIC according to [facility] (  WHO) criteria. Site Region Measured Date Measured Age YA T-score BMD Significant CHANGE DualFemur Total Left 12/13/2017    59.3         -3.9    0.516 g/cm2 AP Spine  L1-L4      14/13/2017    59.3         -3.0    0.821 g/cm2 World Health Organization [facility]) criteria for post-menopausal, Caucasian Women: Normal       T-score at or above -1 SD Osteopenia   T-score between -1 and -2.5 SD Osteoporosis T-score at or below -2.5 SD RECOMMENDATION: [place] recommends that FDA-approved medical therapies be considered in postmenopausal women and men age 65 or older with a: 1. Hip or vertebral (clinical or morphometric) fracture. 2. T-score of less than or equal to -2.5 at the spine or hip. 3. Ten-year fracture probability by FRAX of 3% or greater for hip fracture or 20% or greater for major osteoporotic fracture. All treatment decisions require clinical judgment and consideration of individual patient factors, including patient preferences, co-morbidities,  previous drug use, risk factors not captured in the FRAX model (e.g. falls, vitamin D deficiency, increased bone turnover, interval significant decline in bone density) and possible under- or over-estimation of fracture risk by FRAX. All patients should ensure an adequate intake of dietary calcium (1200 mg/d) and vitamin D (800 IU daily) unless contraindicated. FOLLOW-UP: People with diagnosed cases of osteoporosis or at high risk for fracture should have regular bone mineral density tests. For patients eligible for Medicare, routine testing is allowed once every 2 years. The testing frequency can be increased to one year for patients who have rapidly progressing disease, those who are receiving or discontinuing medical therapy to restore bone mass, or have additional risk factors. I have reviewed this report and agree with the above findings. Mark A. Thornton Papas, M.D. [facility] Radiology Electronically Signed   By: Lavonia Dana M.D.   On: 12/13/2017 16:02   Xr Cervical Spine 2 Or 3 Views  Result Date: 01/03/2018 Multilevel spondylosis.  She has fusion of C5-6.  Spondylolisthesis was noted between C7 and T1.  Facet joint arthropathy was noted. Impression: Multilevel spondylosis with facet joint arthropathy.   Speciality Comments: No specialty comments available.    Procedures:  No procedures performed Allergies: Depakote [divalproex sodium]   Assessment / Plan:     Visit Diagnoses: Fibromyalgia -she is been having increased pain recently.  She is on Cymbalta and Flexeril. She takes Flexeril on when necessary basis.  Primary insomnia - On Ambien when necessary.  Neck pain -she has been experiencing increased neck pain especially with left lateral rotation.  I will obtain x-rays today.  I have also referred her to physical therapy.  If she has ongoing discomfort she will notify us.  Plan: XR Cervical Spine 2 or 3 views  DDD (degenerative disc disease), cervical  DDD (degenerative disc disease),  lumbar  Other fatigue-related to insomnia.  Medication management - Plan: CBC with Differential/Platelet, COMPLETE METABOLIC PANEL WITH GFR  Age-related osteoporosis without current pathological fracture -she has been diagnosed with osteoporosis recently.  Her T score was -3.9.  Plan: TSH, Parathyroid hormone, intact (no Ca), VITAMIN D 25 Hydroxy (Vit-D Deficiency, Fractures), Serum protein electrophoresis with reflex.  Different treatment options and their side effects were discussed at length.  She wants to try Actonel.  My plan is to start her on Actonel 150 mg p.o. monthly after the lab results are available.  Use of calcium, vitamin  D and resistive exercises were discussed.  History of gastroesophageal reflux (GERD)  History of IBS  Interstitial cystitis    Orders: Orders Placed This Encounter  Procedures  . XR Cervical Spine 2 or 3 views  . CBC with Differential/Platelet  . COMPLETE METABOLIC PANEL WITH GFR  . TSH  . Parathyroid hormone, intact (no Ca)  . VITAMIN D 25 Hydroxy (Vit-D Deficiency, Fractures)  . Serum protein electrophoresis with reflex   No orders of the defined types were placed in this encounter.   Face-to-face time spent with patient was 30 minutes.  Greater than 50% of time was spent in counseling and coordination of care.  Follow-Up Instructions: Return in about 3 months (around 04/05/2018) for FMS .   Bo Merino, MD  Note - This record has been created using Editor, commissioning.  Chart creation errors have been sought, but may not always  have been located. Such creation errors do not reflect on  the standard of medical care.

## 2017-12-24 DIAGNOSIS — M545 Low back pain: Secondary | ICD-10-CM | POA: Diagnosis not present

## 2017-12-24 DIAGNOSIS — M797 Fibromyalgia: Secondary | ICD-10-CM | POA: Diagnosis not present

## 2017-12-24 DIAGNOSIS — M542 Cervicalgia: Secondary | ICD-10-CM | POA: Diagnosis not present

## 2017-12-24 NOTE — Telephone Encounter (Signed)
Last OV 10/04/2017   Last refilled 05/15/2016 disp 30 with 3 refills   Sent to PCP for approval

## 2017-12-27 ENCOUNTER — Ambulatory Visit: Payer: 59 | Admitting: Obstetrics & Gynecology

## 2018-01-03 ENCOUNTER — Ambulatory Visit (INDEPENDENT_AMBULATORY_CARE_PROVIDER_SITE_OTHER): Payer: Self-pay

## 2018-01-03 ENCOUNTER — Encounter: Payer: Self-pay | Admitting: Rheumatology

## 2018-01-03 ENCOUNTER — Ambulatory Visit (INDEPENDENT_AMBULATORY_CARE_PROVIDER_SITE_OTHER): Payer: 59 | Admitting: Rheumatology

## 2018-01-03 VITALS — BP 109/70 | HR 76 | Resp 16 | Ht 65.0 in | Wt 108.0 lb

## 2018-01-03 DIAGNOSIS — F5101 Primary insomnia: Secondary | ICD-10-CM | POA: Diagnosis not present

## 2018-01-03 DIAGNOSIS — N3010 Interstitial cystitis (chronic) without hematuria: Secondary | ICD-10-CM

## 2018-01-03 DIAGNOSIS — M542 Cervicalgia: Secondary | ICD-10-CM

## 2018-01-03 DIAGNOSIS — M810 Age-related osteoporosis without current pathological fracture: Secondary | ICD-10-CM | POA: Diagnosis not present

## 2018-01-03 DIAGNOSIS — M797 Fibromyalgia: Secondary | ICD-10-CM | POA: Diagnosis not present

## 2018-01-03 DIAGNOSIS — R5383 Other fatigue: Secondary | ICD-10-CM | POA: Diagnosis not present

## 2018-01-03 DIAGNOSIS — M5136 Other intervertebral disc degeneration, lumbar region: Secondary | ICD-10-CM | POA: Diagnosis not present

## 2018-01-03 DIAGNOSIS — Z8719 Personal history of other diseases of the digestive system: Secondary | ICD-10-CM

## 2018-01-03 DIAGNOSIS — M5030 Other cervical disc degeneration, unspecified cervical region: Secondary | ICD-10-CM | POA: Diagnosis not present

## 2018-01-03 DIAGNOSIS — Z79899 Other long term (current) drug therapy: Secondary | ICD-10-CM | POA: Diagnosis not present

## 2018-01-03 NOTE — Patient Instructions (Signed)
Risedronate tablets What is this medicine? RISEDRONATE (ris ED roe nate) reduces calcium loss from bones. It helps make healthy bone and to slow bone loss in patients with Paget's disease and osteoporosis. It may be used in others at risk for bone loss. This medicine may be used for other purposes; ask your health care provider or pharmacist if you have questions. COMMON BRAND NAME(S): Actonel What should I tell my health care provider before I take this medicine? They need to know if you have any of these conditions: -dental disease -esophagus, stomach, or intestine problems, like acid reflux or GERD -kidney disease -low blood calcium -problems sitting or standing for 30 minutes -trouble swallowing -an unusual or allergic reaction to risedronate, other medicines, foods, dyes, or preservatives -pregnant or trying to get pregnant -breast-feeding How should I use this medicine? You must take this medication exactly as directed or you will lower the amount of medicine you absorb into your body or you may cause your self harm. Take this medicine by mouth first thing in the morning, after you are up for the day. Do not eat or drink anything before you take this medicine. Swallow the tablets with a full glass (6 to 8 fluid ounces) of plain water. Do not take the tablets with any other drink. Do not chew or crush the tablet. After taking this medicine, do not eat breakfast, drink, or take any other medicines or vitamins for at least 30 minutes. Stand or sit up for at least 30 minutes after you take this medicine; do not lie down. Do not take your medicine more often than directed. Talk to your pediatrician regarding the use of this medicine in children. Special care may be needed. Overdosage: If you think you have taken too much of this medicine contact a poison control center or emergency room at once. NOTE: This medicine is only for you. Do not share this medicine with others. What if I miss a  dose? If you miss a dose, do not take it later in the day. Take your normal dose the next morning. Do not take double or extra doses. What may interact with this medicine? -antacids like aluminum hydroxide or magnesium hydroxide -aspirin -calcium supplements -iron supplements -NSAIDs, medicines for pain and inflammation, like ibuprofen or naproxen -thyroid hormones -vitamins with minerals This list may not describe all possible interactions. Give your health care provider a list of all the medicines, herbs, non-prescription drugs, or dietary supplements you use. Also tell them if you smoke, drink alcohol, or use illegal drugs. Some items may interact with your medicine. What should I watch for while using this medicine? Visit your doctor or health care professional for regular check ups. It may be some time before you see the benefit from this medicine. Your doctor or health care professional may order blood tests and other tests to see how you are doing. You should make sure you get enough calcium and vitamin D while you are taking this medicine, unless your doctor tells you not to. Discuss the foods you eat and the vitamins you take with your health care professional. Some people who take this medicine have severe bone, joint, and/or muscle pain. This medicine may also increase your risk for a broken thigh bone. Tell your doctor right away if you have pain in your upper leg or groin. Tell your doctor if you have any pain that does not go away or that gets worse. What side effects may I notice from receiving  this medicine? Side effects that you should report to your doctor or health care professional as soon as possible: -allergic reactions such as skin rash or itching, hives, swelling of the face, lips, throat, or tongue -black or tarry stools -changes in vision -heartburn or stomach pain -jaw pain, especially after dental work -pain or difficulty when swallowing -redness, blistering,  peeling, or loosening of the skin, including inside the mouth Side effects that usually do not require medical attention (report to your doctor or health care professional if they continue or are bothersome): -bone, muscle, or joint pain -changes in taste -diarrhea or constipation -eye pain or itching -headache -nausea or vomiting -stomach gas or fullness This list may not describe all possible side effects. Call your doctor for medical advice about side effects. You may report side effects to FDA at 1-800-FDA-1088. Where should I keep my medicine? Keep out of the reach of children. Store at room temperature between 20 and 25 degrees C (68 and 77 degrees F). Throw away any unused medicine after the expiration date. NOTE: This sheet is a summary. It may not cover all possible information. If you have questions about this medicine, talk to your doctor, pharmacist, or health care provider.  2018 Elsevier/Gold Standard (2015-09-22 09:18:09)  

## 2018-01-08 LAB — PROTEIN ELECTROPHORESIS, SERUM, WITH REFLEX
Albumin ELP: 4.4 g/dL (ref 3.8–4.8)
Alpha 1: 0.2 g/dL (ref 0.2–0.3)
Alpha 2: 0.6 g/dL (ref 0.5–0.9)
Beta 2: 0.2 g/dL (ref 0.2–0.5)
Beta Globulin: 0.4 g/dL (ref 0.4–0.6)
Gamma Globulin: 0.8 g/dL (ref 0.8–1.7)
Total Protein: 6.7 g/dL (ref 6.1–8.1)

## 2018-01-08 LAB — COMPLETE METABOLIC PANEL WITH GFR
AG Ratio: 2.1 (calc) (ref 1.0–2.5)
ALT: 10 U/L (ref 6–29)
AST: 22 U/L (ref 10–35)
Albumin: 4.5 g/dL (ref 3.6–5.1)
Alkaline phosphatase (APISO): 86 U/L (ref 33–130)
BUN: 16 mg/dL (ref 7–25)
CO2: 28 mmol/L (ref 20–32)
Calcium: 9.3 mg/dL (ref 8.6–10.4)
Chloride: 107 mmol/L (ref 98–110)
Creat: 0.53 mg/dL (ref 0.50–1.05)
GFR, Est African American: 120 mL/min/{1.73_m2} (ref >=60)
GFR, Est Non African American: 104 mL/min/{1.73_m2} (ref >=60)
Globulin: 2.1 g/dL (calc) (ref 1.9–3.7)
Glucose, Bld: 88 mg/dL (ref 65–99)
Potassium: 4.3 mmol/L (ref 3.5–5.3)
Sodium: 141 mmol/L (ref 135–146)
Total Bilirubin: 0.3 mg/dL (ref 0.2–1.2)
Total Protein: 6.6 g/dL (ref 6.1–8.1)

## 2018-01-08 LAB — TSH: TSH: 1.91 mIU/L (ref 0.40–4.50)

## 2018-01-08 LAB — CBC WITH DIFFERENTIAL/PLATELET
Basophils Absolute: 39 cells/uL (ref 0–200)
Basophils Relative: 0.7 %
Eosinophils Absolute: 218 cells/uL (ref 15–500)
Eosinophils Relative: 3.9 %
HCT: 40.2 % (ref 35.0–45.0)
Hemoglobin: 13.6 g/dL (ref 11.7–15.5)
Lymphs Abs: 1618 cells/uL (ref 850–3900)
MCH: 31.6 pg (ref 27.0–33.0)
MCHC: 33.8 g/dL (ref 32.0–36.0)
MCV: 93.3 fL (ref 80.0–100.0)
MPV: 11.3 fL (ref 7.5–12.5)
Monocytes Relative: 5.9 %
Neutro Abs: 3394 cells/uL (ref 1500–7800)
Neutrophils Relative %: 60.6 %
Platelets: 179 10*3/uL (ref 140–400)
RBC: 4.31 10*6/uL (ref 3.80–5.10)
RDW: 12 % (ref 11.0–15.0)
Total Lymphocyte: 28.9 %
WBC mixed population: 330 cells/uL (ref 200–950)
WBC: 5.6 10*3/uL (ref 3.8–10.8)

## 2018-01-08 LAB — PARATHYROID HORMONE, INTACT (NO CA): PTH: 120 pg/mL — ABNORMAL HIGH (ref 14–64)

## 2018-01-08 LAB — VITAMIN D 25 HYDROXY (VIT D DEFICIENCY, FRACTURES): Vit D, 25-Hydroxy: 37 ng/mL (ref 30–100)

## 2018-01-08 LAB — IFE INTERPRETATION

## 2018-01-08 NOTE — Progress Notes (Signed)
Please refer her to [facility] endocrinology for evaluation of elevated PTH. Refer  patient to hematology for abnormal IFE.

## 2018-01-09 ENCOUNTER — Telehealth: Payer: Self-pay | Admitting: Rheumatology

## 2018-01-09 ENCOUNTER — Telehealth: Payer: Self-pay | Admitting: *Deleted

## 2018-01-09 DIAGNOSIS — R899 Unspecified abnormal finding in specimens from other organs, systems and tissues: Secondary | ICD-10-CM

## 2018-01-09 NOTE — Telephone Encounter (Signed)
Patient returning your call. Please call back when available.

## 2018-01-09 NOTE — Telephone Encounter (Signed)
-----   Message from Bo Merino, MD sent at 01/08/2018 12:39 PM EDT ----- Please refer her to [facility] endocrinology for evaluation of elevated PTH. Refer  patient to hematology for abnormal IFE.

## 2018-01-09 NOTE — Telephone Encounter (Signed)
Patient advised of lab results. Patient already has a referral from her gyn to endocrinology and she already has an appointment with Dr. Chalmers Cater. Patient advised about her referral to hematology

## 2018-01-10 ENCOUNTER — Telehealth: Payer: Self-pay | Admitting: Internal Medicine

## 2018-01-10 ENCOUNTER — Encounter: Payer: Self-pay | Admitting: Internal Medicine

## 2018-01-10 DIAGNOSIS — M545 Low back pain: Secondary | ICD-10-CM | POA: Diagnosis not present

## 2018-01-10 DIAGNOSIS — M797 Fibromyalgia: Secondary | ICD-10-CM | POA: Diagnosis not present

## 2018-01-10 DIAGNOSIS — M542 Cervicalgia: Secondary | ICD-10-CM | POA: Diagnosis not present

## 2018-01-10 NOTE — Progress Notes (Signed)
Okay to call in Actonel 150 mg p.o. monthly

## 2018-01-10 NOTE — Telephone Encounter (Signed)
Received a referral from Dr. Estanislado Pandy for abnormal IFE. Spoke to the pt and scheduled her to see Dr. Julien Nordmann on 5/21 at 1130am. Pt aware to arrive 30 minutes early. Letter mailed.

## 2018-01-14 ENCOUNTER — Telehealth: Payer: Self-pay | Admitting: *Deleted

## 2018-01-14 MED ORDER — RISEDRONATE SODIUM 150 MG PO TABS: 150.0000 mg | ORAL_TABLET | ORAL | 2 refills | Status: DC

## 2018-01-14 NOTE — Telephone Encounter (Signed)
-----   Message from Bo Merino, MD sent at 01/10/2018 12:39 PM EDT ----- Faythe Ghee to call in Actonel 150 mg p.o. monthly

## 2018-01-15 DIAGNOSIS — E213 Hyperparathyroidism, unspecified: Secondary | ICD-10-CM | POA: Diagnosis not present

## 2018-01-15 DIAGNOSIS — M810 Age-related osteoporosis without current pathological fracture: Secondary | ICD-10-CM | POA: Diagnosis not present

## 2018-01-16 ENCOUNTER — Telehealth (INDEPENDENT_AMBULATORY_CARE_PROVIDER_SITE_OTHER): Payer: Self-pay | Admitting: Rheumatology

## 2018-01-16 NOTE — Telephone Encounter (Signed)
Debbie @ Dr. Christeen Douglas office called requesting most recent labs. I faxed [phone]. Callback [phone]

## 2018-01-21 ENCOUNTER — Inpatient Hospital Stay: Payer: 59 | Attending: Internal Medicine | Admitting: Internal Medicine

## 2018-01-21 ENCOUNTER — Inpatient Hospital Stay: Payer: 59

## 2018-01-21 ENCOUNTER — Telehealth: Payer: Self-pay

## 2018-01-21 ENCOUNTER — Encounter: Payer: Self-pay | Admitting: Internal Medicine

## 2018-01-21 VITALS — BP 103/74 | HR 72 | Temp 98.9°F | Resp 17 | Ht 65.0 in | Wt 105.4 lb

## 2018-01-21 DIAGNOSIS — Z87891 Personal history of nicotine dependence: Secondary | ICD-10-CM

## 2018-01-21 DIAGNOSIS — M797 Fibromyalgia: Secondary | ICD-10-CM | POA: Insufficient documentation

## 2018-01-21 DIAGNOSIS — M810 Age-related osteoporosis without current pathological fracture: Secondary | ICD-10-CM

## 2018-01-21 DIAGNOSIS — Z803 Family history of malignant neoplasm of breast: Secondary | ICD-10-CM | POA: Diagnosis not present

## 2018-01-21 DIAGNOSIS — M791 Myalgia, unspecified site: Secondary | ICD-10-CM | POA: Diagnosis not present

## 2018-01-21 DIAGNOSIS — D472 Monoclonal gammopathy: Secondary | ICD-10-CM

## 2018-01-21 DIAGNOSIS — R771 Abnormality of globulin: Secondary | ICD-10-CM | POA: Diagnosis present

## 2018-01-21 DIAGNOSIS — K589 Irritable bowel syndrome without diarrhea: Secondary | ICD-10-CM

## 2018-01-21 LAB — CMP (CANCER CENTER ONLY)
ALT: 10 U/L (ref 0–55)
AST: 21 U/L (ref 5–34)
Albumin: 4.3 g/dL (ref 3.5–5.0)
Alkaline Phosphatase: 92 U/L (ref 40–150)
Anion gap: 6 (ref 3–11)
BUN: 14 mg/dL (ref 7–26)
CO2: 27 mmol/L (ref 22–29)
Calcium: 9.5 mg/dL (ref 8.4–10.4)
Chloride: 106 mmol/L (ref 98–109)
Creatinine: 0.64 mg/dL (ref 0.60–1.10)
GFR, Est AFR Am: >60 mL/min (ref >60)
GFR, Estimated: >60 mL/min (ref >60)
Glucose, Bld: 90 mg/dL (ref 70–140)
Potassium: 4 mmol/L (ref 3.5–5.1)
Sodium: 139 mmol/L (ref 136–145)
Total Bilirubin: 0.3 mg/dL (ref 0.2–1.2)
Total Protein: 7.1 g/dL (ref 6.4–8.3)

## 2018-01-21 LAB — CBC WITH DIFFERENTIAL (CANCER CENTER ONLY)
Basophils Absolute: 0 10*3/uL (ref 0.0–0.1)
Basophils Relative: 1 %
Eosinophils Absolute: 0.2 10*3/uL (ref 0.0–0.5)
Eosinophils Relative: 5 %
HCT: 41 % (ref 34.8–46.6)
Hemoglobin: 13.6 g/dL (ref 11.6–15.9)
Lymphocytes Relative: 33 %
Lymphs Abs: 1.4 10*3/uL (ref 0.9–3.3)
MCH: 31.8 pg (ref 25.1–34.0)
MCHC: 33.1 g/dL (ref 31.5–36.0)
MCV: 95.8 fL (ref 79.5–101.0)
Monocytes Absolute: 0.3 10*3/uL (ref 0.1–0.9)
Monocytes Relative: 7 %
Neutro Abs: 2.3 10*3/uL (ref 1.5–6.5)
Neutrophils Relative %: 54 %
Platelet Count: 172 10*3/uL (ref 145–400)
RBC: 4.28 MIL/uL (ref 3.70–5.45)
RDW: 13.2 % (ref 11.2–14.5)
WBC Count: 4.2 10*3/uL (ref 3.9–10.3)

## 2018-01-21 LAB — LACTATE DEHYDROGENASE: LDH: 142 U/L (ref 125–245)

## 2018-01-21 NOTE — Progress Notes (Signed)
[place] [phone]   [phone]  CONSULT NOTE  REFERRING PHYSICIAN: Dr. 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 female with past medical history significant for fibromyalgia, cervical disc disease, mitral valve prolapse, drug-induced hepatitis, dyslipidemia, peptic ulcer disease, and interstitial cystitis.  The patient was seen by Dr. Estanislado Pandy for routine evaluation of her fibromyalgia and osteoporosis.  During her evaluation she had serum protein electrophoresis performed on 01/03/2018 and it showed a poorly defined band of restricted protein mobility detected in the gammaglobulins suspicious for monoclonal protein.  The patient was referred to me today for further evaluation and recommendation regarding her condition. When seen today she is feeling fine with no specific complaints except for aching pain from the fibromyalgia.  She also has upset stomach recently.  She denied having any weight loss or night sweats.  She has no nausea, vomiting, diarrhea or constipation.  She denied having any fever or chills.  She has no chest pain, shortness of breath, cough or hemoptysis. Family history significant for mother with breast cancer and father had congestive heart failure. The patient is married and has 1 son.  She works as a Government social research officer.  She has a history for smoking for around 30 years but quit 4 years ago.  She drinks wine occasionally and no history of drug abuse.  HPI  Past Medical History:  Diagnosis Date  . Cervical disc disease 2006  . 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    . Fibromyalgia    sees Dr. Estanislado Pandy   . Heart murmur   . 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 Hyperlipidemia   . Migraines   . MVP (mitral valve prolapse)    no daily medication  . Peptic ulcer    no problems    Past  Surgical History:  Procedure Laterality Date  . CERVICAL LAMINECTOMY  2006  . CESAREAN SECTION  1993  . CHOLECYSTECTOMY  06/2002  . COLONOSCOPY  06-06-12   per Dr. Olevia Perches, hyperplastic polyps, repeat in 10 yrs   . DILATATION & CURETTAGE/HYSTEROSCOPY WITH MYOSURE N/A 04/12/2017   Procedure: DILATATION & CURETTAGE/HYSTEROSCOPY;  Surgeon: Megan Salon, MD;  Location: [facility];  Service: Gynecology;  Laterality: N/A;  . HYSTEROSCOPY     years ago with Dr Radene Knee-? 2000  . TUBAL LIGATION      Family History  Problem Relation Age of Onset  . Breast cancer Mother        lumpectomy in late 50/60  . Heart attack Father   . Congestive Heart Failure Father   . Cancer Unknown        breast/fhx  . Heart disease Unknown        fhx  . Diabetes type II Sister        and ? brother  . Arthritis Son        psoriatic  . Colon cancer Neg Hx   . Esophageal cancer Neg Hx   . Rectal cancer Neg Hx   . Stomach cancer Neg Hx     Social History Social History   Tobacco Use  . Smoking status: Former Smoker    Packs/day: 0.50    Years: 20.00    Pack years: 10.00    Types: Cigarettes    Last attempt to quit: 04/02/2013  Years since quitting: 4.8  . Smokeless tobacco: Never Used  Substance Use Topics  . Alcohol use: Yes    Alcohol/week: 0.6 oz    Types: 1 Glasses of wine per week    Comment: occ  . Drug use: Never    Allergies  Allergen Reactions  . Depakote [Divalproex Sodium]     Drug inducted hepatitis    Current Outpatient Medications  Medication Sig Dispense Refill  . aspirin 81 MG tablet Take 81 mg by mouth daily.      . butalbital-acetaminophen-caffeine (FIORICET, ESGIC) 50-325-40 MG tablet TAKE 1 TABLET BY MOUTH EVERY 6 HOURS AS NEEDED 120 tablet 5  . cetirizine (ZYRTEC) 10 MG tablet Take 10 mg by mouth as needed.     . chlorzoxazone (PARAFON) 500 MG tablet TAKE 1 TABLET BY MOUTH FOUR TIMES DAILY AS NEEDED FOR MUSCLE SPASMS 120 tablet 0  . cholecalciferol  (VITAMIN D) 1000 units tablet Take 1,000 Units by mouth daily.    . Cyanocobalamin (VITAMIN B-12 PO) Take by mouth daily.    . DULoxetine (CYMBALTA) 30 MG capsule TAKE 2 CAPSULES BY MOUTH EVERY MORNING 180 capsule 0  . estradiol (ESTRACE) 1 MG tablet TAKE 1 TABLET(1 MG) BY MOUTH DAILY 30 tablet 12  . medroxyPROGESTERone (PROVERA) 5 MG tablet Take 1 tablet (5 mg total) by mouth daily. 30 tablet 0  . zolpidem (AMBIEN) 10 MG tablet TAKE 1 TABLET BY MOUTH EVERY DAY AT BEDTIME 90 tablet 1  . albuterol (PROVENTIL HFA;VENTOLIN HFA) 108 (90 Base) MCG/ACT inhaler Inhale 2 puffs into the lungs every 4 (four) hours as needed for wheezing or shortness of breath. (Patient not taking: Reported on 01/21/2018) 1 Inhaler 5  . cyclobenzaprine (FLEXERIL) 10 MG tablet Take 1 tablet (10 mg total) by mouth at bedtime. for muscle spams (Patient not taking: Reported on 01/03/2018) 30 tablet 5  . meloxicam (MOBIC) 7.5 MG tablet TAKE 1 TABLET(7.5 MG) BY MOUTH DAILY (Patient not taking: Reported on 01/21/2018) 90 tablet 3  . zolmitriptan (ZOMIG) 5 MG tablet TAKE 1 TABLET BY MOUTH AS NEEDED (Patient not taking: Reported on 01/21/2018) 30 tablet 5   No current facility-administered medications for this visit.     Review of Systems  Constitutional: positive for fatigue Eyes: negative Ears, nose, mouth, throat, and face: negative Respiratory: negative Cardiovascular: negative Gastrointestinal: negative Genitourinary:negative Integument/breast: negative Hematologic/lymphatic: negative Musculoskeletal:positive for arthralgias Neurological: negative Behavioral/Psych: negative Endocrine: negative Allergic/Immunologic: negative  Physical Exam  CHE:NIDPO, healthy, no distress, well nourished and well developed SKIN: skin color, texture, turgor are normal, no rashes or significant lesions HEAD: Normocephalic, No masses, lesions, tenderness or abnormalities EYES: normal, PERRLA, Conjunctiva are pink and non-injected EARS:  External ears normal, Canals clear OROPHARYNX:no exudate, no erythema and lips, buccal mucosa, and tongue normal  NECK: supple, no adenopathy, no JVD LYMPH:  no palpable lymphadenopathy, no hepatosplenomegaly BREAST:not examined LUNGS: clear to auscultation , and palpation HEART: regular rate & rhythm, no murmurs and no gallops ABDOMEN:abdomen soft, non-tender, normal bowel sounds and no masses or organomegaly BACK: Back symmetric, no curvature., No CVA tenderness EXTREMITIES:no joint deformities, effusion, or inflammation, no edema  NEURO: alert & oriented x 3 with fluent speech, no focal motor/sensory deficits  PERFORMANCE STATUS: ECOG 0  LABORATORY DATA: Lab Results  Component Value Date   WBC 5.6 01/03/2018   HGB 13.6 01/03/2018   HCT 40.2 01/03/2018   MCV 93.3 01/03/2018   PLT 179 01/03/2018      Chemistry  Component Value Date/Time   NA 141 01/03/2018 1227   K 4.3 01/03/2018 1227   CL 107 01/03/2018 1227   CO2 28 01/03/2018 1227   BUN 16 01/03/2018 1227   CREATININE 0.53 01/03/2018 1227      Component Value Date/Time   CALCIUM 9.3 01/03/2018 1227   ALKPHOS 73 07/12/2017 1146   AST 22 01/03/2018 1227   ALT 10 01/03/2018 1227   BILITOT 0.3 01/03/2018 1227       RADIOGRAPHIC STUDIES: Xr Cervical Spine 2 Or 3 Views  Result Date: 01/03/2018 Multilevel spondylosis.  She has fusion of C5-6.  Spondylolisthesis was noted between C7 and T1.  Facet joint arthropathy was noted. Impression: Multilevel spondylosis with facet joint arthropathy.   ASSESSMENT: This is a very pleasant 62 years old white female with history of fibromyalgia as well as osteoporosis who was found on recent blood work to have suspicious monoclonal protein.   PLAN: I had a lengthy discussion with the patient today about her condition.  This could be secondary to her rheumatological disorder but I will order few studies to rule out the possibility of underlying monoclonal gammopathy or multiple  myeloma. I will order myeloma panel including CBC, comprehensive metabolic panel, LDH, quantitative immunoglobulin, serum light chain and beta-2 microglobulin in addition to 24-hour urine protein electrophoresis with immunofixation. I will arrange for the patient to come back for follow-up visit in 2 weeks for reevaluation and discussion of her lab results and further recommendation regarding her condition. If there is any concerning findings on the pending blood work, I may consider The patient for bone marrow biopsy and aspirate for further evaluation of her condition otherwise she will be discharged and continue her routine follow-up visit by her primary care physician and rheumatologist. The patient agreed to the current plan. She was advised to call immediately if she has any concerning symptoms in the interval. The patient voices understanding of current disease status and treatment options and is in agreement with the current care plan.  All questions were answered. The patient knows to call the clinic with any problems, questions or concerns. We can certainly see the patient much sooner if necessary.  Thank you so much for allowing me to participate in the care of Emily Ward. I will continue to follow up the patient with you and assist in her care.  Disclaimer: This note was dictated with voice recognition software. Similar sounding words can inadvertently be transcribed and may not be corrected upon review.   Eilleen Kempf Jan 21, 2018, 12:09 PM

## 2018-01-21 NOTE — Telephone Encounter (Signed)
Printed avs and calender of upcoming appointment.  Per 5/21 los 

## 2018-01-21 NOTE — Telephone Encounter (Signed)
Error opening 5/21

## 2018-01-22 LAB — KAPPA/LAMBDA LIGHT CHAINS
Kappa free light chain: 18.7 mg/L (ref 3.3–19.4)
Kappa, lambda light chain ratio: 0.87 (ref 0.26–1.65)
Lambda free light chains: 21.5 mg/L (ref 5.7–26.3)

## 2018-01-22 LAB — BETA 2 MICROGLOBULIN, SERUM: Beta-2 Microglobulin: 1.6 mg/L (ref 0.6–2.4)

## 2018-01-22 LAB — IGG, IGA, IGM
IgA: 139 mg/dL (ref 87–352)
IgG (Immunoglobin G), Serum: 748 mg/dL (ref 700–1600)
IgM (Immunoglobulin M), Srm: 339 mg/dL — ABNORMAL HIGH (ref 26–217)

## 2018-01-23 ENCOUNTER — Telehealth: Payer: Self-pay | Admitting: Oncology

## 2018-01-23 NOTE — Telephone Encounter (Signed)
R/s appt per 5/23 sch message - left message with appt date and time.

## 2018-01-24 DIAGNOSIS — M810 Age-related osteoporosis without current pathological fracture: Secondary | ICD-10-CM | POA: Diagnosis not present

## 2018-01-28 DIAGNOSIS — R771 Abnormality of globulin: Secondary | ICD-10-CM | POA: Diagnosis not present

## 2018-01-30 LAB — UPEP/UIFE/LIGHT CHAINS/TP, 24-HR UR
% BETA, Urine: 0 %
ALPHA 1 URINE: 0 %
Albumin, U: 100 %
Alpha 2, Urine: 0 %
Free Kappa Lt Chains,Ur: 7.41 mg/L (ref 1.35–24.19)
Free Kappa/Lambda Ratio: 5.79 (ref 2.04–10.37)
Free Lambda Lt Chains,Ur: 1.28 mg/L (ref 0.24–6.66)
GAMMA GLOBULIN URINE: 0 %
Total Protein, Urine-Ur/day: 82 mg/24 hr (ref 30–150)
Total Protein, Urine: 6.8 mg/dL
Total Volume: 1200

## 2018-02-03 ENCOUNTER — Telehealth: Payer: Self-pay | Admitting: Medical Oncology

## 2018-02-03 NOTE — Telephone Encounter (Signed)
CBC was normal less than 2 weeks ago.  Follow-up as previously scheduled on 6/13 unless she has significant bleeding.

## 2018-02-03 NOTE — Telephone Encounter (Signed)
States she thinks she has petechiae on ankle and today she sees a cluster on her left calf, one on eyelid and nose , lower back.  appt 6/13.

## 2018-02-04 ENCOUNTER — Ambulatory Visit: Payer: 59 | Admitting: Oncology

## 2018-02-04 NOTE — Telephone Encounter (Signed)
Called pt. Unable to reach lmovm

## 2018-02-06 DIAGNOSIS — E213 Hyperparathyroidism, unspecified: Secondary | ICD-10-CM | POA: Diagnosis not present

## 2018-02-06 DIAGNOSIS — M810 Age-related osteoporosis without current pathological fracture: Secondary | ICD-10-CM | POA: Diagnosis not present

## 2018-02-06 DIAGNOSIS — R21 Rash and other nonspecific skin eruption: Secondary | ICD-10-CM | POA: Diagnosis not present

## 2018-02-06 NOTE — Telephone Encounter (Signed)
Pt notified- Dr Mellody Dance  referred her pt to dermatology.

## 2018-02-10 ENCOUNTER — Other Ambulatory Visit: Payer: Self-pay | Admitting: Family Medicine

## 2018-02-11 NOTE — Telephone Encounter (Signed)
Last OV 10/04/2017   Last refilled 09/30/2017 disp 120 with no refills   Sent to PCP for approval

## 2018-02-13 ENCOUNTER — Inpatient Hospital Stay: Payer: 59 | Attending: Internal Medicine | Admitting: Oncology

## 2018-02-13 ENCOUNTER — Other Ambulatory Visit: Payer: Self-pay

## 2018-02-13 ENCOUNTER — Encounter: Payer: Self-pay | Admitting: Oncology

## 2018-02-13 VITALS — BP 101/74 | HR 78 | Temp 98.6°F | Resp 17 | Ht 65.0 in | Wt 105.7 lb

## 2018-02-13 DIAGNOSIS — M810 Age-related osteoporosis without current pathological fracture: Secondary | ICD-10-CM

## 2018-02-13 DIAGNOSIS — D472 Monoclonal gammopathy: Secondary | ICD-10-CM

## 2018-02-13 DIAGNOSIS — R768 Other specified abnormal immunological findings in serum: Secondary | ICD-10-CM

## 2018-02-13 DIAGNOSIS — M797 Fibromyalgia: Secondary | ICD-10-CM | POA: Diagnosis not present

## 2018-02-13 DIAGNOSIS — R21 Rash and other nonspecific skin eruption: Secondary | ICD-10-CM | POA: Diagnosis not present

## 2018-02-13 NOTE — Progress Notes (Signed)
[facility] OFFICE PROGRESS NOTE  Laurey Morale, MD [place] [address]  DIAGNOSIS: History of fibromyalgia as well as osteoporosis who was found on recent blood work to have suspicious monoclonal protein.  PRIOR THERAPY: None  CURRENT THERAPY: None  INTERVAL HISTORY: Emily Ward 62 y.o. female returns for feeling fine today and has no specific complaints except for ongoing arthralgias related to fibromyalgia.  She reports that she developed a rash to her bilateral lower extremities and back.  Is been going on for approximately 2 weeks.  She is scheduled to see a dermatologist in the near future.  She denies fevers and chills.  Denies chest pain, shortness of breath, cough, hemoptysis.  Denies nausea, vomiting, constipation, diarrhea.  Denies recent weight loss or night sweats.  The patient is here for evaluation and discussion of her recent lab work.  MEDICAL HISTORY: Past Medical History:  Diagnosis Date  . Cervical disc disease 2006  . 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    . Fibromyalgia    sees Dr. Estanislado Pandy   . Heart murmur   . 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 Hyperlipidemia   . Migraines   . MVP (mitral valve prolapse)    no daily medication  . Peptic ulcer    no problems    ALLERGIES:  is allergic to depakote [divalproex sodium].  MEDICATIONS:  Current Outpatient Medications  Medication Sig Dispense Refill  . aspirin 81 MG tablet Take 81 mg by mouth daily.      . butalbital-acetaminophen-caffeine (FIORICET, ESGIC) 50-325-40 MG tablet TAKE 1 TABLET BY MOUTH EVERY 6 HOURS AS NEEDED 120 tablet 5  . calcium carbonate (TUMS - DOSED IN MG ELEMENTAL CALCIUM) 500 MG chewable tablet Chew 1 tablet by mouth daily.    . chlorzoxazone (PARAFON) 500 MG tablet TAKE 1 TABLET BY MOUTH FOUR TIMES DAILY AS NEEDED FOR MUSCLE SPASMS 120 tablet 3  . cholecalciferol (VITAMIN D) 1000 units  tablet Take 1,000 Units by mouth daily.    . Cyanocobalamin (VITAMIN B-12 PO) Take by mouth daily.    . cyclobenzaprine (FLEXERIL) 10 MG tablet Take 1 tablet (10 mg total) by mouth at bedtime. for muscle spams 30 tablet 5  . DULoxetine (CYMBALTA) 30 MG capsule TAKE 2 CAPSULES BY MOUTH EVERY MORNING 180 capsule 0  . estradiol (ESTRACE) 1 MG tablet TAKE 1 TABLET(1 MG) BY MOUTH DAILY 30 tablet 12  . medroxyPROGESTERone (PROVERA) 5 MG tablet Take 1 tablet (5 mg total) by mouth daily. 30 tablet 0  . zolmitriptan (ZOMIG) 5 MG tablet TAKE 1 TABLET BY MOUTH AS NEEDED 30 tablet 5  . zolpidem (AMBIEN) 10 MG tablet TAKE 1 TABLET BY MOUTH EVERY DAY AT BEDTIME 90 tablet 1  . albuterol (PROVENTIL HFA;VENTOLIN HFA) 108 (90 Base) MCG/ACT inhaler Inhale 2 puffs into the lungs every 4 (four) hours as needed for wheezing or shortness of breath. (Patient not taking: Reported on 01/21/2018) 1 Inhaler 5  . cetirizine (ZYRTEC) 10 MG tablet Take 10 mg by mouth as needed.     . meloxicam (MOBIC) 7.5 MG tablet TAKE 1 TABLET(7.5 MG) BY MOUTH DAILY (Patient not taking: Reported on 01/21/2018) 90 tablet 3   No current facility-administered medications for this visit.     SURGICAL HISTORY:  Past Surgical History:  Procedure Laterality Date  . CERVICAL LAMINECTOMY  2006  . CESAREAN SECTION  1993  .  CHOLECYSTECTOMY  06/2002  . COLONOSCOPY  06-06-12   per Dr. Olevia Perches, hyperplastic polyps, repeat in 10 yrs   . DILATATION & CURETTAGE/HYSTEROSCOPY WITH MYOSURE N/A 04/12/2017   Procedure: DILATATION & CURETTAGE/HYSTEROSCOPY;  Surgeon: Megan Salon, MD;  Location: [facility];  Service: Gynecology;  Laterality: N/A;  . HYSTEROSCOPY     years ago with Dr Radene Knee-? 2000  . TUBAL LIGATION      REVIEW OF SYSTEMS:   Review of Systems  Constitutional: Negative for appetite change, chills, fatigue, fever and unexpected weight change.  HENT:   Negative for mouth sores, nosebleeds, sore throat and trouble swallowing.    Eyes: Negative for eye problems and icterus.  Respiratory: Negative for cough, hemoptysis, shortness of breath and wheezing.   Cardiovascular: Negative for chest pain and leg swelling.  Gastrointestinal: Negative for abdominal pain, constipation, diarrhea, nausea and vomiting.  Genitourinary: Negative for bladder incontinence, difficulty urinating, dysuria, frequency and hematuria.   Musculoskeletal: Positive for generalized arthralgias related to fibromyalgia.  Skin: Positive for rash to her bilateral lower extremities and back.  Neurological: Negative for dizziness, extremity weakness, gait problem, headaches, light-headedness and seizures.  Hematological: Negative for adenopathy.   Psychiatric/Behavioral: Negative for confusion, depression and sleep disturbance. The patient is not nervous/anxious.     PHYSICAL EXAMINATION:  Blood pressure 101/74, pulse 78, temperature 98.6 F (37 C), temperature source Oral, resp. rate 17, height '5\' 5"'  (1.651 m), weight 105 lb 11.2 oz (47.9 kg), last menstrual period 09/03/2000, SpO2 99 %.  ECOG PERFORMANCE STATUS: 0 - Asymptomatic  Physical Exam  Constitutional: Oriented to person, place, and time and well-developed, well-nourished, and in no distress. No distress.  HENT:  Head: Normocephalic and atraumatic.  Mouth/Throat: Oropharynx is clear and moist. No oropharyngeal exudate.  Eyes: Conjunctivae are normal. Right eye exhibits no discharge. Left eye exhibits no discharge. No scleral icterus.  Neck: Normal range of motion. Neck supple.  Cardiovascular: Normal rate, regular rhythm, normal heart sounds and intact distal pulses.   Pulmonary/Chest: Effort normal and breath sounds normal. No respiratory distress. No wheezes. No rales.  Abdominal: Soft. Bowel sounds are normal. Exhibits no distension and no mass. There is no tenderness.  Musculoskeletal: Normal range of motion. Exhibits no edema.  Lymphadenopathy:    No cervical adenopathy.   Neurological: Alert and oriented to person, place, and time. Exhibits normal muscle tone. Gait normal. Coordination normal.  Skin: Skin is warm and dry.  Faint rash noted to her bilateral ankles and back.  Psychiatric: Mood, memory and judgment normal.  Vitals reviewed.  LABORATORY DATA: Lab Results  Component Value Date   WBC 4.2 01/21/2018   HGB 13.6 01/21/2018   HCT 41.0 01/21/2018   MCV 95.8 01/21/2018   PLT 172 01/21/2018      Chemistry      Component Value Date/Time   NA 139 01/21/2018 1308   K 4.0 01/21/2018 1308   CL 106 01/21/2018 1308   CO2 27 01/21/2018 1308   BUN 14 01/21/2018 1308   CREATININE 0.64 01/21/2018 1308   CREATININE 0.53 01/03/2018 1227      Component Value Date/Time   CALCIUM 9.5 01/21/2018 1308   ALKPHOS 92 01/21/2018 1308   AST 21 01/21/2018 1308   ALT 10 01/21/2018 1308   BILITOT 0.3 01/21/2018 1308     Kappa free light chain 18.7, lambda free light chain 21.5, kappa/lambda light chain ratio 0.87, urine M spike not observed, beta-2 microglobulin 1.6, IgG 748, IgA 139, IgM  339, LDH 142  RADIOGRAPHIC STUDIES:  No results found.   ASSESSMENT/PLAN: This is a very pleasant 62 year old white female with history of fibromyalgia as well as osteoporosis who was found on recent blood work to have suspicious monoclonal protein.  She had additional lab work performed recently including a CBC, comprehensive metabolic panel, LDH, quantitative immunoglobulin, serum light chain and beta-2 microglobulin in addition to 24-hour urine protein electrophoresis with immunofixation.  Patient is here to discuss the results.  The patient was seen with Dr. Julien Nordmann.  Lab results were reviewed with the patient and are normal with the exception of mildly elevated IgM which is nonspecific.  We discussed that her previous poorly defined band of restricted protein on SPEP may be related to her rheumatological condition.  We do not see any evidence of multiple myeloma or an  underlying monoclonal gammopathy. The patient will follow-up in our office on as-needed basis. She will continue to follow-up with her primary care provider, rheumatologist, and endocrinologist as already scheduled.  She was advised to call immediately if she has any concerning symptoms in the interval. The patient voices understanding of current disease status and treatment options and is in agreement with the current care plan.  All questions were answered. The patient knows to call the clinic with any problems, questions or concerns. We can certainly see the patient much sooner if necessary.  No orders of the defined types were placed in this encounter.  Mikey Bussing, DNP, AGPCNP-BC, AOCNP 02/13/18  ADDENDUM: Hematology/Oncology Attending: I had a face-to-face encounter with the patient.  I recommended her care plan.  This is a very pleasant 62 years old white female with history of fibromyalgia as well as osteoporosis.  She was referred to me for evaluation of suspicious monoclonal gammopathy.  I order several studies on the patient including repeat myeloma panel as well as 24-hour urine protein electrophoresis with immune fixation.  Her lab results showed no concerning findings for monoclonal gammopathy or any other abnormalities except for a slightly elevated IgM which is nonspecific.  I discussed the lab results with the patient today.  I recommended for her to continue her routine follow-up visit with her primary care physician or rheumatologist.  I do not see a need for the patient to come to the cancer center at regular basis unless there is any other concerning findings. The patient is in agreement with the current plan. She was advised to call if she has any other concerning symptoms.  Disclaimer: This note was dictated with voice recognition software. Similar sounding words can inadvertently be transcribed and may be missed upon review. Eilleen Kempf, MD 02/15/18

## 2018-02-21 ENCOUNTER — Other Ambulatory Visit: Payer: Self-pay | Admitting: Rheumatology

## 2018-02-21 NOTE — Telephone Encounter (Signed)
Last Visit: 01/03/18 Next visit: 04/07/18  Okay to refill per Dr. Estanislado Pandy

## 2018-02-28 ENCOUNTER — Encounter: Payer: Self-pay | Admitting: Family Medicine

## 2018-02-28 ENCOUNTER — Ambulatory Visit (INDEPENDENT_AMBULATORY_CARE_PROVIDER_SITE_OTHER): Payer: 59 | Admitting: Family Medicine

## 2018-02-28 VITALS — BP 98/78 | HR 68 | Temp 98.4°F | Wt 103.0 lb

## 2018-02-28 DIAGNOSIS — H10211 Acute toxic conjunctivitis, right eye: Secondary | ICD-10-CM

## 2018-02-28 DIAGNOSIS — R21 Rash and other nonspecific skin eruption: Secondary | ICD-10-CM | POA: Diagnosis not present

## 2018-02-28 MED ORDER — POLYMYXIN B-TRIMETHOPRIM 10000-0.1 UNIT/ML-% OP SOLN: 1.0000 [drp] | OPHTHALMIC | 0 refills | Status: DC

## 2018-02-28 NOTE — Patient Instructions (Signed)
Chemical Conjunctivitis, Adult Chemical conjunctivitis, or chemical pink eye, is inflammation of a part of the eye called the conjunctiva. The conjunctiva is the clear covering of the white of your eye and your inner eyelid. The condition makes your eye red, pink, and itchy. This condition can occur in one or both of your eyes. It cannot not spread from person to person (is not contagious). What are the causes? This condition is caused by a chemical or irritant, such as:  Smoke.  Chlorine.  Soap.  Fumes.  Air pollution.  You can get this condition if a chemical or irritant gets in your eye. What increases the risk? This condition is more likely to develop in:  People who live in places with high levels of air pollution.  People who work in environments with chemicals in the air.  People who use swimming pools often.  People who wear contact lenses.  What are the signs or symptoms? Symptoms of this condition include:  Eye redness.  Tearing of the eyes.  Itchy eyes.  Burning feeling in the eyes.  Clear drainage from the eyes.  Swollen eyelids.  Sensitivity to light.  How is this diagnosed? This condition is diagnosed based on your symptoms, your medical history, and a physical exam. During the exam, your health care provider may use a medical instrument that uses magnified light (slit lamp) to examine your eyes. If you have drainage in your eyes, it may be tested to rule out other causes. How is this treated? Treatment for this condition involves carefully rinsing (flushing) the chemical out of your eye. Treatment may also include:  Artificial tears to help wash out any remaining chemical.  Steroid medicine to ease inflammation.  Antibiotic medicine to prevent infection.  Cold compresses to ease discomfort and inflammation.  Over-the-counter pain medicines to ease discomfort.  Follow these instructions at home:  Take or apply over-the-counter and  prescription medicines only as told by your health care provider.  If you were prescribed an antibiotic medicine, take or apply it as told by your health care provider. Do not stop using the antibiotic even if you start to feel better.  Until the inflammation is gone or as long as directed by your health care provider: ? Do not wear contact lenses. Wear glasses instead. ? Do not wear eye makeup. ? Do not touch or rub your eyes.  Apply a cool, clean compress to your eye for 10-20 minutes, 3-4 times a day for comfort.  Avoid being around the chemical or environment that caused the irritation. Wear eye protection as necessary.  Wash your hands with soap and water before you apply eye drops or cool compresses to your eyes. If soap and water are not available, use hand sanitizer.  Keep all follow-up visits as told by your health care provider. This is important. Contact a health care provider if:  Your symptoms do not improve or they get worse.  You have new symptoms.  Your pain gets worse.  You have pus draining from your eye. Get help right away if:  Your vision suddenly gets worse. Summary  Chemical conjunctivitis, or chemical pink eye, is inflammation of a part of the eye called the conjunctiva.  This condition is caused by a chemical or irritant.  This condition is diagnosed based on your symptoms, your medical history, and a physical exam.  Treatment for this condition involves carefully rinsing (flushing) the chemical out of your eye.  Until the inflammation is gone, do  not wear contact lenses or eye makeup. This information is not intended to replace advice given to you by your health care provider. Make sure you discuss any questions you have with your health care provider. Document Released: 05/30/2005 Document Revised: 10/26/2016 Document Reviewed: 10/26/2016 Elsevier Interactive Patient Education  [employer].

## 2018-02-28 NOTE — Progress Notes (Signed)
Subjective:    Patient ID: Emily Ward, female    DOB: 09-22-1955, 62 y.o.   MRN: [id number]  No chief complaint on file.   HPI Patient was seen today for acute concern.  Acute concern.  Pt endorses accidentally spraying mosquito spray in her right eye 2 days ago.  Pt rinsed her eye and has been using saline eyedrops.  Pt endorses yellow drainage and sore lower lid ever since.  Pt does endorse improvement in the lower lid edema but wanted to be checked.  Pt denies changes in vision, eye pain, headache.  Pt also mentions she will be seeing Dermatology for petechiae of undetermined cause.  She also notes a rash on her back that seems to be improving.  Pt states it started on b/l hips, then moved up her R back.  The rash is at time pruritic.  Pt tried cortisone cream. She cannot recall any changes to lotions, soaps, detergents.  Past Medical History:  Diagnosis Date  . Cervical disc disease 2006  . 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    . Fibromyalgia    sees Dr. Estanislado Pandy   . Heart murmur   . 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 Hyperlipidemia   . Migraines   . MVP (mitral valve prolapse)    no daily medication  . Peptic ulcer    no problems    Allergies  Allergen Reactions  . Depakote [Divalproex Sodium]     Drug inducted hepatitis    ROS General: Denies fever, chills, night sweats, changes in weight, changes in appetite HEENT: Denies headaches, ear pain, changes in vision, rhinorrhea, sore throat  +R eye drainage, lower lid edema and soreness CV: Denies CP, palpitations, SOB, orthopnea Pulm: Denies SOB, cough, wheezing GI: Denies abdominal pain, nausea, vomiting, diarrhea, constipation GU: Denies dysuria, hematuria, frequency, vaginal discharge Msk: Denies muscle cramps, joint pains Neuro: Denies weakness, numbness, tingling Skin: Denies bruising  +rash on back, peticiae of LEs Psych: Denies depression, anxiety,  hallucinations     Objective:    Blood pressure 98/78, pulse 68, temperature 98.4 F (36.9 C), temperature source Oral, weight 103 lb (46.7 kg), last menstrual period 09/03/2000, SpO2 94 %.   Gen. Pleasant, well-nourished, in no distress, normal affect   HEENT: [place]/AT, face symmetric, R lower lid with mild edema, no erythema, conjunctiva mildly injected on R, clear on L, no scleral icterus, PERRLA, EOMI, nares patent without drainage, pharynx without erythema or exudate. Lungs: no accessory muscle use Cardiovascular: RRR Neuro:  A&Ox3, CN II-XII intact, normal gait Skin:  Warm, dry, intact.  Slightly erythematous, dry appearing papules from R hip up to R shoulder.  Scattered petechiae bilateral lower extremities   Wt Readings from Last 3 Encounters:  02/28/18 103 lb (46.7 kg)  02/13/18 105 lb 11.2 oz (47.9 kg)  01/21/18 105 lb 6.4 oz (47.8 kg)    Lab Results  Component Value Date   WBC 4.2 01/21/2018   HGB 13.6 01/21/2018   HCT 41.0 01/21/2018   PLT 172 01/21/2018   GLUCOSE 90 01/21/2018   CHOL 192 07/12/2017   TRIG 76.0 07/12/2017   HDL 65.70 07/12/2017   LDLDIRECT 153.5 04/03/2012   LDLCALC 112 (H) 07/12/2017   ALT 10 01/21/2018   AST 21 01/21/2018   NA 139 01/21/2018   K 4.0 01/21/2018   CL 106 01/21/2018   CREATININE 0.64 01/21/2018   BUN 14 01/21/2018  CO2 27 01/21/2018   TSH 1.91 01/03/2018    Assessment/Plan:  Chemical conjunctivitis of right eye  -ok to continue using saline eye gtts -discussed using polytrim if drainage continues -avoid rubbing eye -ok to use cool compress to decrease lower lid edema -given handout. - Plan: trimethoprim-polymyxin b (POLYTRIM) ophthalmic solution  Rash of back -Continue cortisone cream as needed -Avoid scratching -Follow-up with dermatologist  Follow-up PRN  Grier Mitts, MD

## 2018-03-12 ENCOUNTER — Other Ambulatory Visit: Payer: Self-pay | Admitting: Family Medicine

## 2018-03-12 NOTE — Telephone Encounter (Signed)
Last OV 02/28/2018   Last refilled 09/10/2017 disp 120 with 5 refills   Sent to PCP for approval

## 2018-03-12 NOTE — Telephone Encounter (Signed)
Call in #120 with 5 rf 

## 2018-03-19 DIAGNOSIS — L270 Generalized skin eruption due to drugs and medicaments taken internally: Secondary | ICD-10-CM | POA: Diagnosis not present

## 2018-03-24 NOTE — Progress Notes (Signed)
Office Visit Note  Patient: Emily Ward             Date of Birth: 08/12/1956           MRN: [id number]             PCP: Laurey Morale, MD Referring: Laurey Morale, MD Visit Date: 04/07/2018 Occupation: @GUAROCC @  Subjective:  Petechiae   History of Present Illness: Emily Ward is a 62 y.o. female with history of fibromyalgia, DDD, and osteoporosis.  Patient reports that she had her first Prolia injection on 01/24/2018.  She states that a week later she developed petechiae and bruising on her lower extremities.  She says she is also experiencing some fatigue.  She states she called her hematology office, and they did not seem concerned due to platelets being within normal limits. She states she was seen by dermatology in July for a rash on her back as well as the petechiae and she was started on triamcinolone cream as well as Zyrtec for the rash on her back which has not helped.  She has intermittent itching in this region.  She states she is also noted some mottling on both knees.  She states she is apprehensive to have the next Prolia injection.  She has been taking vitamin D and calcium on a daily basis. She states that overall her fibromyalgia has been well controlled.  She continues to take Cymbalta 60 mg by mouth daily and Flexeril 10 mg p.o. at bedtime as needed.  She is not needing refills at this time.  She continues to take Ambien 10 mg at bedtime which helps with insomnia.  She has chronic fatigue.  She says she continues to have muscle tenderness and muscle tension in the trapezius muscles as well as between her shoulder blades.  She states that her lower back has been doing well denies any joint stiffness.  She states she continues to do yoga on a regular basis.  She denies any joint pain or joint swelling at this time.  She states with weather changes she developed some increased achiness in multiple joints.  She reports in the past she went to physical therapy which helped  her fibromyalgia pain significantly.  She states that she has been doing the stretching exercises on a daily basis.    Activities of Daily Living:  Patient reports morning stiffness for 5 minutes.   Patient Denies nocturnal pain.  Difficulty dressing/grooming: Denies Difficulty climbing stairs:Reports  Difficulty getting out of chair: Denies Difficulty using hands for taps, buttons, cutlery, and/or writing: Denies  Review of Systems  Constitutional: Positive for fatigue.  HENT: Positive for mouth dryness. Negative for mouth sores and nose dryness.   Eyes: Negative for pain, visual disturbance and dryness.  Respiratory: Negative for cough, hemoptysis, shortness of breath and difficulty breathing.   Cardiovascular: Negative for chest pain, palpitations, hypertension and swelling in legs/feet.  Gastrointestinal: Negative for blood in stool, constipation and diarrhea.  Endocrine: Negative for increased urination.  Genitourinary: Negative for painful urination.  Musculoskeletal: Positive for arthralgias (Reports), joint pain (Reports), myalgias, muscle weakness, morning stiffness, muscle tenderness and myalgias. Negative for joint swelling.  Skin: Positive for rash. Negative for color change, pallor, hair loss, nodules/bumps, skin tightness, ulcers and sensitivity to sunlight.  Allergic/Immunologic: Negative for susceptible to infections.  Neurological: Negative for dizziness and numbness.  Hematological: Negative for swollen glands.  Psychiatric/Behavioral: Negative for depressed mood and sleep disturbance. The patient is not nervous/anxious.  PMFS History:  Patient Active Problem List   Diagnosis Date Noted  . MGUS (monoclonal gammopathy of unknown significance) 02/13/2018  . COPD with emphysema ([place]) 10/04/2017  . Palpitations 07/30/2017  . Shortness of breath 07/30/2017  . Hormone replacement therapy (HRT) 03/30/2017  . Former smoker 03/30/2017  . Other fatigue 06/30/2016  .  Insomnia 06/30/2016  . DJD (degenerative joint disease), cervical 06/30/2016  . 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 IBS (irritable bowel syndrome) 06/30/2016  . Interstitial cystitis 06/30/2016  . SHOULDER PAIN, RIGHT 09/05/2010  . GERD 03/24/2009  . SACROILIAC STRAIN 03/24/2009  . Atypical chest pain 01/20/2008  . HEMATURIA UNSPECIFIED 10/14/2007  . FIBROMYALGIA 07/03/2007  . HYPERLIPIDEMIA NEC/NOS 05/16/2007  . HEADACHE 05/09/2007    Past Medical History:  Diagnosis Date  . Cervical disc disease 2006  . 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    . Fibromyalgia    sees Dr. Estanislado Pandy   . Heart murmur   . 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 Hyperlipidemia   . Migraines   . MVP (mitral valve prolapse)    no daily medication  . Peptic ulcer    no problems    Family History  Problem Relation Age of Onset  . Breast cancer Mother        lumpectomy in late 50/60  . Heart attack Father   . Congestive Heart Failure Father   . Cancer Unknown        breast/fhx  . Heart disease Unknown        fhx  . Diabetes type II Sister        and ? brother  . Arthritis Son        psoriatic  . Colon cancer Neg Hx   . Esophageal cancer Neg Hx   . Rectal cancer Neg Hx   . Stomach cancer Neg Hx    Past Surgical History:  Procedure Laterality Date  . CERVICAL LAMINECTOMY  2006  . CESAREAN SECTION  1993  . CHOLECYSTECTOMY  06/2002  . COLONOSCOPY  06-06-12   per Dr. Olevia Perches, hyperplastic polyps, repeat in 10 yrs   . DILATATION & CURETTAGE/HYSTEROSCOPY WITH MYOSURE N/A 04/12/2017   Procedure: DILATATION & CURETTAGE/HYSTEROSCOPY;  Surgeon: Megan Salon, MD;  Location: [facility];  Service: Gynecology;  Laterality: N/A;  . HYSTEROSCOPY     years ago with Dr Radene Knee-? 2000  . TUBAL LIGATION     Social History   Social History Narrative  . Not on file    Objective: Vital Signs:  BP 114/70 (BP Location: Left Arm, Patient Position: Sitting, Cuff Size: Normal)   Pulse 76   Ht 5\' 5"  (1.651 m)   Wt 106 lb (48.1 kg)   LMP 09/03/2000 (Approximate)   BMI 17.64 kg/m    Physical Exam  Constitutional: She is oriented to person, place, and time. She appears well-developed and well-nourished.  HENT:  Head: Normocephalic and atraumatic.  No parotid swelling.   Eyes: Conjunctivae and EOM are normal.  Neck: Normal range of motion.  Cardiovascular: Normal rate, regular rhythm, normal heart sounds and intact distal pulses.  Pulmonary/Chest: Effort normal and breath sounds normal.  Abdominal: Soft. Bowel sounds are normal.  Lymphadenopathy:    She has no cervical adenopathy.  Neurological: She is alert and oriented to person, place, and time.  Skin: Skin is warm and dry. Capillary refill takes less  than 2 seconds.  Petechiae scattered on lower extremities.  No purpura noted.  Some mottling of the skin on bilateral knee joints was noted.  No signs of Raynauds.  No digital ulcerations or signs of gangrene noted.  Psychiatric: She has a normal mood and affect. Her behavior is normal.  Nursing note and vitals reviewed.    Musculoskeletal Exam: C-spine, thoracic spine, lumbar spine good range of motion.  No midline spinal tenderness.  Left SI joint tenderness on exam.  Shoulder joints, elbow joints, wrist joints, MCPs, PIPs and DIPs good range of motion no synovitis.  She has mild PIP and DIP synovial thickening consistent with osteoarthritis of bilateral hands.  Hip joints, knee joints, ankle joints, MTPs, PIPs, DIPs good range of motion with no synovitis.  No warmth or effusion of bilateral knee joints.  Petechiae present on lower extremities.  No purpura noted.  CDAI Exam: No CDAI exam completed.   Investigation: No additional findings.  Imaging: No results found.  Recent Labs: Lab Results  Component Value Date   WBC 4.2 01/21/2018   HGB 13.6 01/21/2018   PLT 172  01/21/2018   NA 139 01/21/2018   K 4.0 01/21/2018   CL 106 01/21/2018   CO2 27 01/21/2018   GLUCOSE 90 01/21/2018   BUN 14 01/21/2018   CREATININE 0.64 01/21/2018   BILITOT 0.3 01/21/2018   ALKPHOS 92 01/21/2018   AST 21 01/21/2018   ALT 10 01/21/2018   PROT 7.1 01/21/2018   ALBUMIN 4.3 01/21/2018   CALCIUM 9.5 01/21/2018   GFRAA >60 01/21/2018    Speciality Comments: No specialty comments available.  Procedures:  No procedures performed Allergies: Depakote [divalproex sodium]   Assessment / Plan:     Visit Diagnoses: Fibromyalgia -She has not had any recent fibromyalgia flares.  She continues to have some generalized muscle aches and muscle tenderness due to fiber myalgia.  She experiences increased myalgias and polyarthralgias with weather changes.  She will continue taking Cymbalta 60 mg p.o. daily and Flexeril 10 mg p.o. at bedtime as needed.  She does not need any refills at this time.  She was encouraged to stay active and perform yoga on a regular basis.  Primary insomnia she continues to take Ambien 10 mg p.o. at bedtime as needed.  Good sleep hygiene was discussed.  Other fatigue: Chronic.  DDD (degenerative disc disease), cervical - s/p fusion C5-C6, Multilevel spondylosis with facet joint arthropathy.  She has slightly limited range of motion on exam.  She has no discomfort at this time.  DDD (degenerative disc disease), lumbar: Good range of motion.  No midline spinal tenderness.  Age-related osteoporosis without current pathological fracture - 12/13/17 DEXA: T score was -3.9.  She had her first Prolia injection on 01/24/2018.  She has been taking calcium and vitamin D on a daily basis.  She is apprehensive to have her next Prolia injection due to developing petechiae 1 week after her first Prolia injection.  Petechiae -She developed petechiae on her lower extremities 1 week after her Prolia injection.  She is worried about the diagnosis of vasculitis.  The following  labs will be checked today.  She has not had any worsening fatigue or low-grade fevers.  She does not have any signs of Raynaud's.  No digital ulcerations or gangrene was noted.  She has mottling on bilateral knees but no diffuse livedo reticularis was noted.  We will call her back with the lab results when they have all resulted.  Plan: Pan-ANCA, Sedimentation rate, ANA, CBC with Differential/Platelet   Orders: Orders Placed This Encounter  Procedures  . Pan-ANCA  . Sedimentation rate  . ANA  . CBC with Differential/Platelet   No orders of the defined types were placed in this encounter.   Face-to-face time spent with patient was 30 minutes. Greater than 50% of time was spent in counseling and coordination of care.  Follow-Up Instructions: Return in about 6 months (around 10/08/2018) for Fibromyalgia, Osteoporosis, DDD.   Ofilia Neas, PA-C  Note - This record has been created using Dragon software.  Chart creation errors have been sought, but may not always  have been located. Such creation errors do not reflect on  the standard of medical care.

## 2018-04-07 ENCOUNTER — Ambulatory Visit (INDEPENDENT_AMBULATORY_CARE_PROVIDER_SITE_OTHER): Payer: 59 | Admitting: Physician Assistant

## 2018-04-07 ENCOUNTER — Encounter: Payer: Self-pay | Admitting: Physician Assistant

## 2018-04-07 VITALS — BP 114/70 | HR 76 | Ht 65.0 in | Wt 106.0 lb

## 2018-04-07 DIAGNOSIS — M797 Fibromyalgia: Secondary | ICD-10-CM | POA: Diagnosis not present

## 2018-04-07 DIAGNOSIS — M5030 Other cervical disc degeneration, unspecified cervical region: Secondary | ICD-10-CM

## 2018-04-07 DIAGNOSIS — R233 Spontaneous ecchymoses: Secondary | ICD-10-CM

## 2018-04-07 DIAGNOSIS — F5101 Primary insomnia: Secondary | ICD-10-CM

## 2018-04-07 DIAGNOSIS — M810 Age-related osteoporosis without current pathological fracture: Secondary | ICD-10-CM

## 2018-04-07 DIAGNOSIS — R5383 Other fatigue: Secondary | ICD-10-CM | POA: Diagnosis not present

## 2018-04-07 DIAGNOSIS — M5136 Other intervertebral disc degeneration, lumbar region: Secondary | ICD-10-CM

## 2018-04-07 NOTE — Patient Instructions (Signed)
[facility] Ask your health care provider which exercises are safe for you. Do exercises exactly as told by your health care provider and adjust them as directed. It is normal to feel mild stretching, pulling, tightness, or discomfort as you do these exercises, but you should stop right away if you feel sudden pain or your pain gets worse. Do not begin these exercises until told by your health care provider. Stretching and range of motion exercises These exercises warm up your muscles and joints and improve the movement and flexibility of your elbow. Exercise A: Wrist flexors  1. Straighten your left / right elbow in front of you with your palm up. If told by your health care provider, do this stretch with your elbow bent rather than straight. 2. With your other hand, gently pull your left / right hand and fingers toward you until you feel a gentle stretch on the top of your forearm. 3. Hold this position for __________ seconds. Repeat __________ times. Complete this exercise __________ times a day. Strengthening exercises These exercises build strength and endurance in your elbow. Endurance is the ability to use your muscles for a long time, even after several repetitions. Exercise B: Wrist flexion  1. Sit with your left / right forearm palm-up and supported on a table or other surface. 2. Let your left / right wrist extend over the edge of the surface. 3. Hold a __________ weight or a piece of rubber exercise band or tubing. Slowly curl your hand up toward your forearm. Try to only move your hand and keep the rest of your arm still. 4. Hold this position for __________ seconds. 5. Slowly return to the starting position. Repeat __________ times. Complete this exercise __________ times a day. Exercise C: Eccentric wrist flexion 1. Sit with your left / right forearm palm-up and supported on a table or other surface. 2. Let your left / right wrist extend over the edge of the  surface. 3. Hold a __________ weight or a piece of rubber exercise band or tubing. 4. Use your other hand to move your left / right hand up toward your forearm. 5. Slowly return to the starting position using only your left / right hand. Repeat __________ times. Complete this exercise __________ times a day. Exercise D: Hand turns, pronation i 1. Sit with your left / right forearm supported on a table or other surface. 2. Move your wrist so your pinkie travels toward your forearm and your thumb moves away from your forearm. 3. Hold this position for __________ seconds. 4. Slowly return to the starting position. Exercise E: Hand turns, pronation ii  1. Sit with your left / right forearm supported on a table or other surface. 2. Hold a hammer in your left / right hand. ? The exercise will be easier if you hold on near the head of the hammer. ? If you hold on toward the end of the handle, the exercise will be harder. 3. Rest your left / right hand over the edge of the table with your palm facing up. 4. Without moving your elbow, slowly turn your palm and your hand down toward the table. 5. Hold this position for __________ seconds. 6. Slowly return to the starting position. Repeat __________ times. Complete this exercise __________ times a day. Exercise F: Shoulder blade squeeze 1. Sit in a stable chair with good posture. Do not let your back touch the back of the chair. 2. Your arms should be at your   sides with your elbows bent. You can rest your forearms on a pillow. 3. Squeeze your shoulder blades together. Keep your shoulders level. Do not lift your shoulders up toward your ears. 4. Hold this position for __________ seconds. 5. Slowly release. Repeat __________ times. Complete this exercise __________ times a day. This information is not intended to replace advice given to you by your health care provider. Make sure you discuss any questions you have with your health care  provider. Document Released: 08/20/2005 Document Revised: 04/26/2016 Document Reviewed: 05/02/2015 Elsevier Interactive Patient Education  2018 [employer]  

## 2018-04-09 LAB — CBC WITH DIFFERENTIAL/PLATELET
Basophils Absolute: 32 cells/uL (ref 0–200)
Basophils Relative: 0.7 %
Eosinophils Absolute: 180 cells/uL (ref 15–500)
Eosinophils Relative: 4 %
HCT: 43.2 % (ref 35.0–45.0)
Hemoglobin: 14.4 g/dL (ref 11.7–15.5)
Lymphs Abs: 1688 cells/uL (ref 850–3900)
MCH: 31.3 pg (ref 27.0–33.0)
MCHC: 33.3 g/dL (ref 32.0–36.0)
MCV: 93.9 fL (ref 80.0–100.0)
MPV: 11.3 fL (ref 7.5–12.5)
Monocytes Relative: 7.4 %
Neutro Abs: 2268 cells/uL (ref 1500–7800)
Neutrophils Relative %: 50.4 %
Platelets: 194 10*3/uL (ref 140–400)
RBC: 4.6 10*6/uL (ref 3.80–5.10)
RDW: 11.9 % (ref 11.0–15.0)
Total Lymphocyte: 37.5 %
WBC mixed population: 333 cells/uL (ref 200–950)
WBC: 4.5 10*3/uL (ref 3.8–10.8)

## 2018-04-09 LAB — ANA: Anti Nuclear Antibody(ANA): NEGATIVE

## 2018-04-09 LAB — SEDIMENTATION RATE: Sed Rate: 6 mm/h (ref 0–30)

## 2018-04-09 LAB — PAN-ANCA
ANCA Screen: NEGATIVE
Myeloperoxidase Abs: <1 AI
Serine Protease 3: <1 AI

## 2018-04-09 NOTE — Progress Notes (Signed)
All labs are WNL

## 2018-04-13 ENCOUNTER — Other Ambulatory Visit: Payer: Self-pay | Admitting: Family Medicine

## 2018-04-14 NOTE — Telephone Encounter (Signed)
Last OV 04/07/2018   Last refilled 10/11/2017 disp 90 with 1 refill   Sent to PCP for approval

## 2018-04-15 ENCOUNTER — Other Ambulatory Visit: Payer: Self-pay

## 2018-04-15 MED ORDER — ZOLPIDEM TARTRATE 10 MG PO TABS: 10.0000 mg | ORAL_TABLET | Freq: Every day | ORAL | 0 refills | Status: DC

## 2018-05-20 ENCOUNTER — Other Ambulatory Visit: Payer: Self-pay | Admitting: Rheumatology

## 2018-05-20 NOTE — Telephone Encounter (Signed)
Last Visit: 04/07/18 Next Visit: 10/10/18  Okay to refill per Dr. Deveshwar 

## 2018-05-23 DIAGNOSIS — Z23 Encounter for immunization: Secondary | ICD-10-CM | POA: Diagnosis not present

## 2018-05-23 DIAGNOSIS — L270 Generalized skin eruption due to drugs and medicaments taken internally: Secondary | ICD-10-CM | POA: Diagnosis not present

## 2018-05-23 DIAGNOSIS — L739 Follicular disorder, unspecified: Secondary | ICD-10-CM | POA: Diagnosis not present

## 2018-05-26 ENCOUNTER — Other Ambulatory Visit: Payer: Self-pay | Admitting: Family Medicine

## 2018-05-26 DIAGNOSIS — Z1231 Encounter for screening mammogram for malignant neoplasm of breast: Secondary | ICD-10-CM

## 2018-06-27 ENCOUNTER — Ambulatory Visit
Admission: RE | Admit: 2018-06-27 | Discharge: 2018-06-27 | Disposition: A | Discharge status: Home / Self Care | Payer: 59 | Source: Ambulatory Visit | Attending: Family Medicine | Admitting: Family Medicine

## 2018-06-27 DIAGNOSIS — Z1231 Encounter for screening mammogram for malignant neoplasm of breast: Secondary | ICD-10-CM

## 2018-07-10 ENCOUNTER — Telehealth: Payer: Self-pay | Admitting: Obstetrics & Gynecology

## 2018-07-10 NOTE — Telephone Encounter (Signed)
Patient called to report the has been having bleeding again for the last week. She'd like an appointment.  Last seen: 09/27/17

## 2018-07-10 NOTE — Telephone Encounter (Signed)
Message left to return call to Champagne Paletta at [phone].    

## 2018-07-11 NOTE — Telephone Encounter (Signed)
Message left to return call to Davone Shinault at [phone].    

## 2018-07-11 NOTE — Telephone Encounter (Signed)
Patient returned call. Patient states she has been having spotting off and on since Sunday. Patient states sometimes she will have nothing on her panty liner and other times the bleeding is enough to soak the panty liner. Patient describes as old, dark blood. Having some mild cramping, but denies need for pain medication. RN advised OV needed for further evaluation. Work in [employer] offered today, but patient declines stating she can't make today work. Reviewed schedule with nursing supervisor, OV offered to patient for Tuesday 07-15-18 at 1000. Patient agreeable to date and time of appointment. Bleeding/pain precautions reviewed with patient and she verbalized understanding.   Routing to provider and will close encounter.

## 2018-07-11 NOTE — Telephone Encounter (Signed)
Patient is returning a call to Tracy. °

## 2018-07-15 ENCOUNTER — Other Ambulatory Visit (HOSPITAL_COMMUNITY)
Admission: RE | Admit: 2018-07-15 | Discharge: 2018-07-15 | Disposition: A | Discharge status: Home / Self Care | Payer: 59 | Source: Ambulatory Visit | Attending: Obstetrics & Gynecology | Admitting: Obstetrics & Gynecology

## 2018-07-15 ENCOUNTER — Other Ambulatory Visit: Payer: Self-pay

## 2018-07-15 ENCOUNTER — Ambulatory Visit (INDEPENDENT_AMBULATORY_CARE_PROVIDER_SITE_OTHER): Payer: 59 | Admitting: Obstetrics & Gynecology

## 2018-07-15 VITALS — BP 100/64 | HR 76 | Resp 14 | Ht 65.0 in | Wt 105.0 lb

## 2018-07-15 DIAGNOSIS — N950 Postmenopausal bleeding: Secondary | ICD-10-CM

## 2018-07-15 DIAGNOSIS — Z124 Encounter for screening for malignant neoplasm of cervix: Secondary | ICD-10-CM

## 2018-07-15 DIAGNOSIS — N898 Other specified noninflammatory disorders of vagina: Secondary | ICD-10-CM | POA: Diagnosis not present

## 2018-07-15 NOTE — Progress Notes (Signed)
GYNECOLOGY  VISIT  CC:     HPI: 62 y.o. G2P2 Married White or Caucasian female here for Postmenopausal bleeding.  This has been an intermittent issue for her.  She had some old, dark appearing blood that was present for two days but has experienced some cramping with this.  Then, bleeding started again.  This was heavy enough that she needed to wear a mini-pad.  This bleeding was pinker in appearance.  This bleeding continued for about 10 days.    Pt has undergone endometrial biopsy, ultrasound and had a hysteroscopy last year.  Also, HRT has been adjusted from both estrogen and progesterone standpoints.  None of this has been able to resolve her intermittent spotting.  As hysteroscopy was over a year ago, I feel she should proceed with having a repeat endometrial biopsy today.  We also discussed hysterectomy as she continues to have bleeding/spotting.  Procedure, hospital stay, risks and recovery reviewed today.  questions answered.  GYNECOLOGIC HISTORY: Patient's last menstrual period was 09/03/2000 (approximate). Contraception: Postmenopausal  Menopausal hormone therapy: Estradiol Provera  Patient Active Problem List   Diagnosis Date Noted  . MGUS (monoclonal gammopathy of unknown significance) 02/13/2018  . COPD with emphysema ([place]) 10/04/2017  . Palpitations 07/30/2017  . Shortness of breath 07/30/2017  . Hormone replacement therapy (HRT) 03/30/2017  . Former smoker 03/30/2017  . Other fatigue 06/30/2016  . Insomnia 06/30/2016  . DJD (degenerative joint disease), cervical 06/30/2016  . 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 IBS (irritable bowel syndrome) 06/30/2016  . Interstitial cystitis 06/30/2016  . SHOULDER PAIN, RIGHT 09/05/2010  . GERD 03/24/2009  . SACROILIAC STRAIN 03/24/2009  . Atypical chest pain 01/20/2008  . HEMATURIA UNSPECIFIED 10/14/2007  . FIBROMYALGIA 07/03/2007  . HYPERLIPIDEMIA NEC/NOS 05/16/2007  . HEADACHE  05/09/2007    Past Medical History:  Diagnosis Date  . Cervical disc disease 2006  . 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    . Fibromyalgia    sees Dr. Estanislado Pandy   . Heart murmur   . 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 Hyperlipidemia   . Migraines   . MVP (mitral valve prolapse)    no daily medication  . Peptic ulcer    no problems    Past Surgical History:  Procedure Laterality Date  . CERVICAL LAMINECTOMY  2006  . CESAREAN SECTION  1993  . CHOLECYSTECTOMY  06/2002  . COLONOSCOPY  06-06-12   per Dr. Olevia Perches, hyperplastic polyps, repeat in 10 yrs   . DILATATION & CURETTAGE/HYSTEROSCOPY WITH MYOSURE N/A 04/12/2017   Procedure: DILATATION & CURETTAGE/HYSTEROSCOPY;  Surgeon: Megan Salon, MD;  Location: [facility];  Service: Gynecology;  Laterality: N/A;  . HYSTEROSCOPY     years ago with Dr Radene Knee-? 2000  . TUBAL LIGATION      MEDS:   Current Outpatient Medications on File Prior to Visit  Medication Sig Dispense Refill  . albuterol (PROVENTIL HFA;VENTOLIN HFA) 108 (90 Base) MCG/ACT inhaler Inhale 2 puffs into the lungs every 4 (four) hours as needed for wheezing or shortness of breath. 1 Inhaler 5  . aspirin 81 MG tablet Take 81 mg by mouth daily.      . butalbital-acetaminophen-caffeine (FIORICET, ESGIC) 50-325-40 MG tablet TAKE 1 TABLET BY MOUTH EVERY 6 HOURS AS NEEDED 120 tablet 5  . cetirizine (ZYRTEC) 10 MG tablet Take 10 mg by mouth as needed.     Marland Kitchen  chlorzoxazone (PARAFON) 500 MG tablet TAKE 1 TABLET BY MOUTH FOUR TIMES DAILY AS NEEDED FOR MUSCLE SPASMS 120 tablet 3  . cholecalciferol (VITAMIN D) 1000 units tablet Take 1,000 Units by mouth daily.    . Cyanocobalamin (VITAMIN B-12 PO) Take by mouth daily.    . cyclobenzaprine (FLEXERIL) 10 MG tablet Take 1 tablet (10 mg total) by mouth at bedtime. for muscle spams 30 tablet 5  . denosumab (PROLIA) 60 MG/ML SOSY injection Inject 60 mg into the skin  every 6 (six) months.    . DULoxetine (CYMBALTA) 30 MG capsule TAKE 2 CAPSULES BY MOUTH EVERY MORNING 180 capsule 0  . estradiol (ESTRACE) 1 MG tablet TAKE 1 TABLET(1 MG) BY MOUTH DAILY 30 tablet 12  . medroxyPROGESTERone (PROVERA) 5 MG tablet Take 1 tablet (5 mg total) by mouth daily. 30 tablet 0  . meloxicam (MOBIC) 7.5 MG tablet TAKE 1 TABLET(7.5 MG) BY MOUTH DAILY 90 tablet 3  . zolmitriptan (ZOMIG) 5 MG tablet TAKE 1 TABLET BY MOUTH AS NEEDED 30 tablet 5  . zolpidem (AMBIEN) 10 MG tablet Take 1 tablet (10 mg total) by mouth at bedtime. 90 tablet 0   No current facility-administered medications on file prior to visit.     ALLERGIES: Depakote [divalproex sodium]  Family History  Problem Relation Age of Onset  . Breast cancer Mother        lumpectomy in late 50/60  . Heart attack Father   . Congestive Heart Failure Father   . Cancer Unknown        breast/fhx  . Heart disease Unknown        fhx  . Diabetes type II Sister        and ? brother  . Arthritis Son        psoriatic  . Colon cancer Neg Hx   . Esophageal cancer Neg Hx   . Rectal cancer Neg Hx   . Stomach cancer Neg Hx     SH:  Married, non smoker  Review of Systems  Genitourinary: Positive for vaginal bleeding.    PHYSICAL EXAMINATION:    BP 100/64   Pulse 76   Resp 14   Ht 5\' 5"  (1.651 m)   Wt 105 lb (47.6 kg)   LMP 09/03/2000 (Approximate)   BMI 17.47 kg/m     General appearance: alert, cooperative and appears stated age Abdomen: soft, non-tender; bowel sounds normal; no masses,  no organomegaly Lymph:  no inguinal LAD noted  Pelvic: External genitalia:  no lesions              Urethra:  normal appearing urethra with no masses, tenderness or lesions              Bartholins and Skenes: normal                 Vagina: normal appearing vagina with normal color, no lesions but whitish discharge noted              Cervix: no lesions              Bimanual Exam:  Uterus:  normal size, contour, position,  consistency, mobility, non-tender              Adnexa: no mass, fullness, tenderness.  Endometrial biopsy recommended.  Discussed with patient.  Verbal and written consent obtained.   Procedure:  Speculum placed.  Cervix visualized and cleansed with betadine prep.  A single toothed tenaculum was not applied  to the anterior lip of the cervix.  Endometrial pipelle was advanced through the cervix into the endometrial cavity without difficulty.  Pipelle passed to 7cm.  Suction applied and pipelle removed with good tissue sample obtained with third pass.  Tenculum removed.  No bleeding noted.  Patient tolerated procedure well.  Chaperone was present for exam.  Assessment: Recurrent PMP bleeding Vaginal discharge HRT use  Plan: Endometrial biopsy and pap smear obtained today.  Results will be called to pt.   She is considering hysterectomy as I feel I just don't have any other options for her except to stop her HRT but she is not sure this is what she wants to do at this time Vaginitis testing obtained as well today.

## 2018-07-16 ENCOUNTER — Encounter: Payer: Self-pay | Admitting: Obstetrics & Gynecology

## 2018-07-16 LAB — NUSWAB BV AND CANDIDA, NAA
Candida albicans, NAA: NEGATIVE
Candida glabrata, NAA: NEGATIVE

## 2018-07-17 LAB — CYTOLOGY - PAP
Diagnosis: NEGATIVE
HPV: NOT DETECTED

## 2018-07-18 ENCOUNTER — Ambulatory Visit (INDEPENDENT_AMBULATORY_CARE_PROVIDER_SITE_OTHER): Payer: 59 | Admitting: Family Medicine

## 2018-07-18 ENCOUNTER — Encounter: Payer: Self-pay | Admitting: Family Medicine

## 2018-07-18 VITALS — BP 90/64 | HR 85 | Temp 98.5°F | Ht 65.0 in | Wt 104.2 lb

## 2018-07-18 DIAGNOSIS — Z1331 Encounter for screening for depression: Secondary | ICD-10-CM

## 2018-07-18 DIAGNOSIS — Z0000 Encounter for general adult medical examination without abnormal findings: Secondary | ICD-10-CM

## 2018-07-18 DIAGNOSIS — J439 Emphysema, unspecified: Secondary | ICD-10-CM | POA: Diagnosis not present

## 2018-07-18 LAB — LIPID PANEL
Cholesterol: 197 mg/dL (ref 0–200)
HDL: 72.1 mg/dL (ref >39.00)
LDL Cholesterol: 114 mg/dL — ABNORMAL HIGH (ref 0–99)
NonHDL: 124.64
Total CHOL/HDL Ratio: 3
Triglycerides: 53 mg/dL (ref 0.0–149.0)
VLDL: 10.6 mg/dL (ref 0.0–40.0)

## 2018-07-18 NOTE — Progress Notes (Signed)
   Subjective:    Patient ID: Emily Ward, female    DOB: 1956-05-16, 62 y.o.   MRN: [id number]  HPI Here for a well exam. She is doing well except for some intermittent SOB. We talked about this in January and now she would like to see a pulmonologist about it.    Review of Systems  Constitutional: Negative.   HENT: Negative.   Eyes: Negative.   Respiratory: Negative.   Cardiovascular: Negative.   Gastrointestinal: Negative.   Genitourinary: Negative for decreased urine volume, difficulty urinating, dyspareunia, dysuria, enuresis, flank pain, frequency, hematuria, pelvic pain and urgency.  Musculoskeletal: Negative.   Skin: Negative.   Neurological: Negative.   Psychiatric/Behavioral: Negative.        Objective:   Physical Exam  Constitutional: She is oriented to person, place, and time. She appears well-developed and well-nourished. No distress.  HENT:  Head: Normocephalic and atraumatic.  Right Ear: External ear normal.  Left Ear: External ear normal.  Nose: Nose normal.  Mouth/Throat: Oropharynx is clear and moist. No oropharyngeal exudate.  Eyes: Pupils are equal, round, and reactive to light. Conjunctivae and EOM are normal. No scleral icterus.  Neck: Normal range of motion. Neck supple. No JVD present. No thyromegaly present.  Cardiovascular: Normal rate, regular rhythm, normal heart sounds and intact distal pulses. Exam reveals no gallop and no friction rub.  No murmur heard. Pulmonary/Chest: Effort normal and breath sounds normal. No respiratory distress. She has no wheezes. She has no rales. She exhibits no tenderness.  Abdominal: Soft. Bowel sounds are normal. She exhibits no distension and no mass. There is no tenderness. There is no rebound and no guarding.  Musculoskeletal: Normal range of motion. She exhibits no edema or tenderness.  Lymphadenopathy:    She has no cervical adenopathy.  Neurological: She is alert and oriented to person, place, and  time. She has normal reflexes. She displays normal reflexes. No cranial nerve deficit. She exhibits normal muscle tone. Coordination normal.  Skin: Skin is warm and dry. No rash noted. No erythema.  Psychiatric: She has a normal mood and affect. Her behavior is normal. Judgment and thought content normal.          Assessment & Plan:  Well exam. We discussed diet and exercise. Get fasting labs. Refer to Pulmonary for COPD. Alysia Penna, MD

## 2018-07-21 ENCOUNTER — Telehealth: Payer: Self-pay | Admitting: *Deleted

## 2018-07-21 NOTE — Telephone Encounter (Signed)
-----   Message from Megan Salon, MD sent at 07/18/2018  6:50 AM EST ----- Please let pt know her vaginitis testing was negative, her pap was normal and HR HPV was negative and her endometrial biopsy was negative for abnormal cells.  She continues to have recurrent PMP bleeding.  We discussed stopping her HRT vs hysterectomy when she was in the office.  She is leaning towards hysterectomy.  Would you please call her?  Thanks.  Cc:  Lowell Bouton, CMA

## 2018-07-21 NOTE — Telephone Encounter (Signed)
Call to patient. Advised of vaginitis culture, pap smear and endometrial biopsy results as directed by Dr Sabra Heck. Patient states she is considering hysterectomy. Potential date options discussed. Patient will review these with family and call back.

## 2018-07-22 NOTE — Telephone Encounter (Signed)
Patient returning call.

## 2018-07-22 NOTE — Telephone Encounter (Signed)
Return call to patient. Desires to proceed with hysterectomy. Would prefer 08-12-18 date if available. 2nd choice of 09-01-18. Advised will schedule and call back once confirmed.

## 2018-07-23 NOTE — Telephone Encounter (Signed)
Return call to patient. Per ROI can leave message on voice mail. Phone number confirmed on phone. Left message to call back to confirm 09-01-18 is available date.

## 2018-07-23 NOTE — Telephone Encounter (Signed)
Return call from patient. Agreeable to 09-01-18. Will await call from business office.

## 2018-07-24 NOTE — Telephone Encounter (Signed)
Spoke with patient regarding benefit for surgery. Patient understood and agreeable. Patient has confirmed and is ready to proceed with scheduling. Patient aware this is professional benefit only. Patient aware will be contacted by hospital for separate benefits. Forwarding to Conservation officer, historic buildings for scheduling  Routing to Lamont Snowball, RN

## 2018-07-30 ENCOUNTER — Other Ambulatory Visit: Payer: Self-pay | Admitting: Obstetrics & Gynecology

## 2018-07-30 NOTE — Telephone Encounter (Signed)
Detailed message left for patient per DPR for patient to return call to review Surgery information form and schedule pre and post op appointments. Advised could ask to speak to [name].

## 2018-07-30 NOTE — Telephone Encounter (Signed)
Patient returned call. Surgery information form reviewed in detail with patient and she verbalized understanding. Pre op appointment scheduled for 08-11-18 at 1600. Patient agreeable to date and time of appointment. Patient aware will receive a copy of surgery information form at pre op appointment. Post op appointment scheduled for 09-08-18 at 1300 and 10-17-18 at 1545. Patient agreeable to date and time of both appointments. Aware [facility] will contact her directly to schedule a pre op appointment at their facility.   Routing to provider and will close encounter.   Cc Lamont Snowball, RN

## 2018-08-11 ENCOUNTER — Encounter: Payer: Self-pay | Admitting: Obstetrics & Gynecology

## 2018-08-11 ENCOUNTER — Ambulatory Visit (INDEPENDENT_AMBULATORY_CARE_PROVIDER_SITE_OTHER): Payer: 59 | Admitting: Obstetrics & Gynecology

## 2018-08-11 VITALS — BP 120/62 | HR 72 | Resp 16 | Ht 65.0 in | Wt 105.2 lb

## 2018-08-11 DIAGNOSIS — N950 Postmenopausal bleeding: Secondary | ICD-10-CM

## 2018-08-11 NOTE — Progress Notes (Addendum)
62 y.o. G2P2 Married White or Caucasian female here for discussion of upcoming procedure total laparoscopic hysterectomy with bilateral salpingectomy, possible BSO, cystoscopy due to recurrent PMP bleeding.  Pre-op evaluation thus far has included ultrasounds, endometrial biopsy, hysteroscopy and dilation and curettage.  Hormonal dosages have been adjusted.     Last ultrasound was 7/18 with normal, atrophic ovaries.  Endometrium was thin.  Bleeding continued and she underwent hysteroscopy 8/18 that showed benign findings.  She has continued to spot and have irregular bleeding.  Repeat pap and endometrial biopsy was performed 11/19.  This was negative as well.  She is tired of bleeding but does not want to stop her HRT.  Therefore, hysterectomy has been discussed.  Procedure discussed with patient.  Hospital stay, recovery and pain management all discussed.  Risks discussed including but not limited to bleeding, 1% risk of receiving a  transfusion, infection, 3-4% risk of bowel/bladder/ureteral/vascular injury discussed as well as possible need for additional surgery if injury does occur discussed.  DVT/PE and rare risk of death discussed.  My actual complications with prior surgeries discussed.  Vaginal cuff dehiscence discussed.  Hernia formation discussed.  Positioning and incision locations discussed.  Patient aware if pathology abnormal she may need additional treatment.  All questions answered.    Ob Hx:   Patient's last menstrual period was 09/03/2000 (approximate).          Sexually active: No. Birth control: PMP Last pap: 07/15/18 Neg. HR HPV:neg  Last MMG: 06/27/18 BIRADS1:neg  Tobacco: No   Past Surgical History:  Procedure Laterality Date  . CERVICAL LAMINECTOMY  2006  . CESAREAN SECTION  1993  . CHOLECYSTECTOMY  06/2002  . COLONOSCOPY  06-06-12   per Dr. Olevia Perches, hyperplastic polyps, repeat in 10 yrs   . DILATATION & CURETTAGE/HYSTEROSCOPY WITH MYOSURE N/A 04/12/2017   Procedure:  DILATATION & CURETTAGE/HYSTEROSCOPY;  Surgeon: Megan Salon, MD;  Location: [facility];  Service: Gynecology;  Laterality: N/A;  . HYSTEROSCOPY     years ago with Dr Radene Knee-? 2000  . TUBAL LIGATION      Past Medical History:  Diagnosis Date  . Cervical disc disease 2006  . 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    . Fibromyalgia    sees Dr. Estanislado Pandy   . Heart murmur   . 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 Hyperlipidemia   . Migraines   . MVP (mitral valve prolapse)    no daily medication  . Peptic ulcer    no problems    Allergies: Depakote [divalproex sodium]  Current Outpatient Medications  Medication Sig Dispense Refill  . aspirin 81 MG tablet Take 81 mg by mouth daily.      . butalbital-acetaminophen-caffeine (FIORICET, ESGIC) 50-325-40 MG tablet TAKE 1 TABLET BY MOUTH EVERY 6 HOURS AS NEEDED 120 tablet 5  . chlorzoxazone (PARAFON) 500 MG tablet TAKE 1 TABLET BY MOUTH FOUR TIMES DAILY AS NEEDED FOR MUSCLE SPASMS 120 tablet 3  . cholecalciferol (VITAMIN D) 1000 units tablet Take 1,000 Units by mouth daily.    . Cyanocobalamin (VITAMIN B-12 PO) Take by mouth daily.    . cyclobenzaprine (FLEXERIL) 10 MG tablet Take 1 tablet (10 mg total) by mouth at bedtime. for muscle spams 30 tablet 5  . denosumab (PROLIA) 60 MG/ML SOSY injection Inject 60 mg into the skin every 6 (six) months.    . DULoxetine (CYMBALTA) 30 MG capsule TAKE 2  CAPSULES BY MOUTH EVERY MORNING 180 capsule 0  . estradiol (ESTRACE) 1 MG tablet TAKE 1 TABLET(1 MG) BY MOUTH DAILY 30 tablet 12  . medroxyPROGESTERone (PROVERA) 5 MG tablet Take 1 tablet (5 mg total) by mouth daily. 30 tablet 0  . meloxicam (MOBIC) 7.5 MG tablet TAKE 1 TABLET(7.5 MG) BY MOUTH DAILY 90 tablet 3  . zolmitriptan (ZOMIG) 5 MG tablet TAKE 1 TABLET BY MOUTH AS NEEDED 30 tablet 5  . zolpidem (AMBIEN) 10 MG tablet Take 1 tablet (10 mg total) by mouth at bedtime. 90 tablet 0   . albuterol (PROVENTIL HFA;VENTOLIN HFA) 108 (90 Base) MCG/ACT inhaler Inhale 2 puffs into the lungs every 4 (four) hours as needed for wheezing or shortness of breath. (Patient not taking: Reported on 08/11/2018) 1 Inhaler 5  . cetirizine (ZYRTEC) 10 MG tablet Take 10 mg by mouth as needed.      No current facility-administered medications for this visit.     ROS: A comprehensive review of systems was negative.  Exam:    BP 120/62 (BP Location: Right Arm, Patient Position: Sitting, Cuff Size: Normal)   Pulse 72   Resp 16   Ht 5\' 5"  (1.651 m)   Wt 105 lb 3.2 oz (47.7 kg)   LMP 09/03/2000 (Approximate)   BMI 17.51 kg/m   General appearance: alert and cooperative Head: Normocephalic, without obvious abnormality, atraumatic Neck: no adenopathy, supple, symmetrical, trachea midline and thyroid not enlarged, symmetric, no tenderness/mass/nodules Lungs: clear to auscultation bilaterally Heart: regular rate and rhythm, S1, S2 normal, no murmur, click, rub or gallop Abdomen: soft, non-tender; bowel sounds normal; no masses,  no organomegaly Extremities: extremities normal, atraumatic, no cyanosis or edema Skin: Skin color, texture, turgor normal. No rashes or lesions Lymph nodes: Cervical, supraclavicular, and axillary nodes normal. no inguinal nodes palpated Neurologic: Grossly normal  Pelvic: not performed  A: recurrent PMP bleeding with negative evaluation Smoker  P:  TLH/BSO/cystoscopy planned Medications/Vitamins reviewed.  Pt knows needs to stop ASA 81mg . Hysterectomy brochure given for pre and post op instructions.

## 2018-08-12 ENCOUNTER — Telehealth: Payer: Self-pay | Admitting: Obstetrics & Gynecology

## 2018-08-12 ENCOUNTER — Other Ambulatory Visit: Payer: Self-pay | Admitting: Rheumatology

## 2018-08-12 NOTE — Telephone Encounter (Signed)
Patient was seen yesterday 08/11/18 and was supposed to receive medical statement from Dr. Sabra Heck for upcoming surgery.

## 2018-08-12 NOTE — Telephone Encounter (Signed)
Call to patient.  Letter at front desk for her to pick up at her convenience.  She is working with HR and unsure if she needs FMLA forms completed at this time.  Will call back if any additional needs.

## 2018-08-12 NOTE — Telephone Encounter (Signed)
Spoke with patient.  At this time she needs a letter from Dr. Sabra Heck to be out of work.  Does not need FMLA forms or Disability forms filled out at this time.  She is requesting a note for her work to state that she will be out of work from 09/01/18 until her 10/10/2018 post op appointment.  At post op appointment will need a return to work note.   Routed Letter to Dr. Sabra Heck to review and pended for additional information to be added as necessary.

## 2018-08-13 NOTE — Telephone Encounter (Signed)
Last Visit: 04/07/18 Next Visit: 10/10/18  Okay to refill per Dr. Estanislado Pandy

## 2018-08-15 ENCOUNTER — Encounter: Payer: Self-pay | Admitting: Pulmonary Disease

## 2018-08-15 ENCOUNTER — Ambulatory Visit (INDEPENDENT_AMBULATORY_CARE_PROVIDER_SITE_OTHER): Payer: 59 | Admitting: Pulmonary Disease

## 2018-08-15 VITALS — BP 126/70 | HR 84 | Ht 65.0 in | Wt 106.6 lb

## 2018-08-15 DIAGNOSIS — J439 Emphysema, unspecified: Secondary | ICD-10-CM

## 2018-08-15 LAB — CBC WITH DIFFERENTIAL/PLATELET
Basophils Absolute: 0 10*3/uL (ref 0.0–0.1)
Basophils Relative: 0.5 % (ref 0.0–3.0)
Eosinophils Absolute: 0.2 10*3/uL (ref 0.0–0.7)
Eosinophils Relative: 4.2 % (ref 0.0–5.0)
HCT: 41.3 % (ref 36.0–46.0)
Hemoglobin: 13.6 g/dL (ref 12.0–15.0)
Lymphocytes Relative: 28.8 % (ref 12.0–46.0)
Lymphs Abs: 1.2 10*3/uL (ref 0.7–4.0)
MCHC: 33 g/dL (ref 30.0–36.0)
MCV: 97 fl (ref 78.0–100.0)
Monocytes Absolute: 0.3 10*3/uL (ref 0.1–1.0)
Monocytes Relative: 6.9 % (ref 3.0–12.0)
Neutro Abs: 2.5 10*3/uL (ref 1.4–7.7)
Neutrophils Relative %: 59.6 % (ref 43.0–77.0)
Platelets: 171 10*3/uL (ref 150.0–400.0)
RBC: 4.26 Mil/uL (ref 3.87–5.11)
RDW: 13.5 % (ref 11.5–15.5)
WBC: 4.2 10*3/uL (ref 4.0–10.5)

## 2018-08-15 MED ORDER — UMECLIDINIUM-VILANTEROL 62.5-25 MCG/INH IN AEPB: 1.0000 | INHALATION_SPRAY | Freq: Every day | RESPIRATORY_TRACT | 0 refills | Status: DC

## 2018-08-15 NOTE — Progress Notes (Signed)
Emily Ward    [id number]    [birth year]  Primary Care Physician:Fry, Ishmael Holter, MD  Referring Physician: Laurey Morale, MD [place], [place] [address]  Chief complaint: Consult for emphysema  HPI: 62 year old with history of mitral valve prolapse, allergies, chronic headaches Complains of dyspnea on exertion which is worsening over the past few years.  She gets short of breath with walking up stairs, up an incline.  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  Scheduled for total hysterectomy with salpingectomy on 09/01/2018  Pets: Cat, no dogs, birds, farm animals Occupation: Works as a Government social research officer Exposures: No known exposures, mold, hot tub, Jacuzzi. Smoking history: 20-pack-year smoker.  Quit in 2014 Travel history: No significant travel history Relevant family history: Mom had emphysema Emily Ward was a smoker)  Outpatient Encounter Medications as of 08/15/2018  Medication Sig  . albuterol (PROVENTIL HFA;VENTOLIN HFA) 108 (90 Base) MCG/ACT inhaler Inhale 2 puffs into the lungs every 4 (four) hours as needed for wheezing or shortness of breath.  Marland Kitchen aspirin 81 MG tablet Take 81 mg by mouth daily.    . butalbital-acetaminophen-caffeine (FIORICET, ESGIC) 50-325-40 MG tablet TAKE 1 TABLET BY MOUTH EVERY 6 HOURS AS NEEDED  . cetirizine (ZYRTEC) 10 MG tablet Take 10 mg by mouth as needed.   . chlorzoxazone (PARAFON) 500 MG tablet TAKE 1 TABLET BY MOUTH FOUR TIMES DAILY AS NEEDED FOR MUSCLE SPASMS  . cholecalciferol (VITAMIN D) 1000 units tablet Take 1,000 Units by mouth daily.  . Cyanocobalamin (VITAMIN B-12 PO) Take by mouth daily.  . cyclobenzaprine (FLEXERIL) 10 MG tablet Take 1 tablet (10 mg total) by mouth at bedtime. for muscle spams  . denosumab (PROLIA) 60 MG/ML SOSY injection Inject 60 mg into the skin every 6 (six) months.  . DULoxetine (CYMBALTA) 30  MG capsule TAKE 2 CAPSULES BY MOUTH EVERY MORNING  . estradiol (ESTRACE) 1 MG tablet TAKE 1 TABLET(1 MG) BY MOUTH DAILY  . medroxyPROGESTERone (PROVERA) 5 MG tablet Take 1 tablet (5 mg total) by mouth daily.  . meloxicam (MOBIC) 7.5 MG tablet TAKE 1 TABLET(7.5 MG) BY MOUTH DAILY  . zolmitriptan (ZOMIG) 5 MG tablet TAKE 1 TABLET BY MOUTH AS NEEDED  . zolpidem (AMBIEN) 10 MG tablet Take 1 tablet (10 mg total) by mouth at bedtime.   No facility-administered encounter medications on file as of 08/15/2018.     Allergies as of 08/15/2018 - Review Complete 08/15/2018  Allergen Reaction Noted  . Depakote [divalproex sodium]  03/28/2017    Past Medical History:  Diagnosis Date  . Cervical disc disease 2006  . 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    . Fibromyalgia    sees Dr. Estanislado Pandy   . Heart murmur   . 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 Hyperlipidemia   . Migraines   . MVP (mitral valve prolapse)    no daily medication  . Peptic ulcer    no problems    Past Surgical History:  Procedure Laterality Date  . CERVICAL LAMINECTOMY  2006  . CESAREAN SECTION  1993  . CHOLECYSTECTOMY  06/2002  . DILATATION & CURETTAGE/HYSTEROSCOPY WITH MYOSURE N/A 04/12/2017   Procedure: DILATATION & CURETTAGE/HYSTEROSCOPY;  Surgeon: Megan Salon, MD;  Location: [facility];  Service: Gynecology;  Laterality: N/A;  .  HYSTEROSCOPY     years ago with Dr Radene Knee-? 2000  . URETHRAL DILATION      Family History  Problem Relation Age of Onset  . Breast cancer Mother        lumpectomy in late 50/60  . Heart attack Father   . Congestive Heart Failure Father   . Cancer Other        breast/fhx  . Heart disease Other        fhx  . Diabetes type II Sister        and ? brother  . Arthritis Son        psoriatic  . Colon cancer Neg Hx   . Esophageal cancer Neg Hx   . Rectal cancer Neg Hx   . Stomach cancer Neg Hx     Social History    Socioeconomic History  . Marital status: Married    Spouse name: Not on file  . Number of children: Not on file  . Years of education: Not on file  . Highest education level: Not on file  Occupational History  . Not on file  Social Needs  . Financial resource strain: Not on file  . Food insecurity:    Worry: Not on file    Inability: Not on file  . Transportation needs:    Medical: Not on file    Non-medical: Not on file  Tobacco Use  . Smoking status: Former Smoker    Packs/day: 1.00    Years: 20.00    Pack years: 20.00    Types: Cigarettes    Last attempt to quit: 04/02/2013    Years since quitting: 5.3  . Smokeless tobacco: Never Used  Substance and Sexual Activity  . Alcohol use: Yes    Alcohol/week: 1.0 standard drinks    Types: 1 Glasses of wine per week    Comment: occ  . Drug use: Never  . Sexual activity: Never    Partners: Male  Lifestyle  . Physical activity:    Days per week: Not on file    Minutes per session: Not on file  . Stress: Not on file  Relationships  . Social connections:    Talks on phone: Not on file    Gets together: Not on file    Attends religious service: Not on file    Active member of club or organization: Not on file    Attends meetings of clubs or organizations: Not on file    Relationship status: Not on file  . Intimate partner violence:    Fear of current or ex partner: Not on file    Emotionally abused: Not on file    Physically abused: Not on file    Forced sexual activity: Not on file  Other Topics Concern  . Not on file  Social History Narrative  . Not on file    Review of systems: Review of Systems  Constitutional: Negative for fever and chills.  HENT: Negative.   Eyes: Negative for blurred vision.  Respiratory: as per HPI  Cardiovascular: Negative for chest pain and palpitations.  Gastrointestinal: Negative for vomiting, diarrhea, blood per rectum. Genitourinary: Negative for dysuria, urgency, frequency and  hematuria.  Musculoskeletal: Negative for myalgias, back pain and joint pain.  Skin: Negative for itching and rash.  Neurological: Negative for dizziness, tremors, focal weakness, seizures and loss of consciousness.  Endo/Heme/Allergies: Negative for environmental allergies.  Psychiatric/Behavioral: Negative for depression, suicidal ideas and hallucinations.  All other systems reviewed and  are negative.  Physical Exam: Blood pressure 126/70, pulse 84, height 5\' 5"  (1.651 m), weight 106 lb 9.6 oz (48.4 kg), last menstrual period 09/03/2000, SpO2 96 %. Gen:      No acute distress HEENT:  EOMI, sclera anicteric Neck:     No masses; no thyromegaly Lungs:    Clear to auscultation bilaterally; normal respiratory effort CV:         Regular rate and rhythm; no murmurs Abd:      + bowel sounds; soft, non-tender; no palpable masses, no distension Ext:    No edema; adequate peripheral perfusion Skin:      Warm and dry; no rash Neuro: alert and oriented x 3 Psych: normal mood and affect  Data Reviewed: Imaging: Chest x-ray 04/15/17-hyperinflation, flattened diaphragms.  No acute cardiopulmonary abnormality CT abdomen pelvis 10/18/2007- visualized lung bases are clear. I have reviewed the images personally.  Cardiac: Echo 08/21/17- LVEF 60-65%, normal wall thickness, normal wall motion, normal diastolic function, normal LA size ,trivial MR, normal RV size with low normal RV systolic function, normal IVC.  Assessment:  Evaluation for emphysema Chest x-ray reviewed with hyperinflation, flattened diaphragms suggestive of emphysema Start inhaler therapy with Anoro to help with progressive dyspnea on exertion Check CBC differential, IgE and alpha-1 antitrypsin levels and phenotype  She will need pulmonary function test we will like her to recover from her planned hysterectomy later this month Will order PFTs and follow-up visit in 3 months.  Plan/Recommendations: - Start Anoro - CBC, IgE,  alpha-1 antitrypsin - PFTs and follow-up in 3 months.  Marshell Garfinkel MD [facility] 08/15/2018, 1:30 PM  CC: Laurey Morale, MD

## 2018-08-15 NOTE — Progress Notes (Signed)
Patient seen in the office today and instructed on use of Anoro.  Patient expressed understanding and demonstrated technique.  [facility] 08/15/18

## 2018-08-15 NOTE — Patient Instructions (Signed)
Start you on a inhaler called Anoro Check CBC differential, IgE, alpha-1 antitrypsin levels and phenotype Please schedule you for pulmonary function testing follow-up in 3 months

## 2018-08-21 LAB — ALPHA-1 ANTITRYPSIN PHENOTYPE: A-1 Antitrypsin, Ser: 126 mg/dL (ref 83–199)

## 2018-08-22 ENCOUNTER — Other Ambulatory Visit: Payer: Self-pay | Admitting: Family Medicine

## 2018-08-22 DIAGNOSIS — R0789 Other chest pain: Secondary | ICD-10-CM

## 2018-08-25 NOTE — Progress Notes (Signed)
ECHO 08-21-17 EPIC

## 2018-08-25 NOTE — Patient Instructions (Addendum)
[place]  08/25/2018   Your procedure is scheduled on: 09-01-18  Report to [place]  Entrance              Report to admitting at 530 AM    Call this number if you have problems the morning of surgery [phone]    Remember: Do not eat food or drink liquids :After Midnight              BRUSH YOUR TEETH MORNING OF SURGERY AND RINSE YOUR MOUTH OUT, NO CHEWING GUM CANDY OR MINTS.     Take these medicines the morning of surgery with A SIP OF WATER: ALBUTEROL INHALER IF NEEDED AND BRING INHALER, ANORO ELLIPTA  INHALER, DULOXETINE (CYMBALTA)                               You may not have any metal on your body including hair pins and              piercings  Do not wear jewelry, make-up, lotions, powders or perfumes, deodorant             Do not wear nail polish.  Do not shave  48 hours prior to surgery.              Men may shave face and neck.   Do not bring valuables to the hospital. [place].  Contacts, dentures or bridgework may not be worn into surgery.  Leave suitcase in the car. After surgery it may be brought to your room.                  Please read over the following fact sheets you were given: _____________________________________________________________________             [facility] - Preparing for Surgery Before surgery, you can play an important role.  Because skin is not sterile, your skin needs to be as free of germs as possible.  You can reduce the number of germs on your skin by washing with CHG (chlorahexidine gluconate) soap before surgery.  CHG is an antiseptic cleaner which kills germs and bonds with the skin to continue killing germs even after washing. Please DO NOT use if you have an allergy to CHG or antibacterial soaps.  If your skin becomes reddened/irritated stop using the CHG and inform your nurse when you arrive at Short Stay. Do not shave (including  legs and underarms) for at least 48 hours prior to the first CHG shower.  You may shave your face/neck. Please follow these instructions carefully:  1.  Shower with CHG Soap the night before surgery and the  morning of Surgery.  2.  If you choose to wash your hair, wash your hair first as usual with your  normal  shampoo.  3.  After you shampoo, rinse your hair and body thoroughly to remove the  shampoo.                           4.  Use CHG as you would any other liquid soap.  You can apply chg directly  to the skin and wash  Gently with a scrungie or clean washcloth.  5.  Apply the CHG Soap to your body ONLY FROM THE NECK DOWN.   Do not use on face/ open                           Wound or open sores. Avoid contact with eyes, ears mouth and genitals (private parts).                       Wash face,  Genitals (private parts) with your normal soap.             6.  Wash thoroughly, paying special attention to the area where your surgery  will be performed.  7.  Thoroughly rinse your body with warm water from the neck down.  8.  DO NOT shower/wash with your normal soap after using and rinsing off  the CHG Soap.                9.  Pat yourself dry with a clean towel.            10.  Wear clean pajamas.            11.  Place clean sheets on your bed the night of your first shower and do not  sleep with pets. Day of Surgery : Do not apply any lotions/deodorants the morning of surgery.  Please wear clean clothes to the hospital/surgery center.  FAILURE TO FOLLOW THESE INSTRUCTIONS MAY RESULT IN THE CANCELLATION OF YOUR SURGERY PATIENT SIGNATURE_________________________________  NURSE SIGNATURE__________________________________  ________________________________________________________________________

## 2018-08-26 ENCOUNTER — Other Ambulatory Visit: Payer: Self-pay | Admitting: Family Medicine

## 2018-08-26 MED ORDER — ZOLPIDEM TARTRATE 10 MG PO TABS: 10.0000 mg | ORAL_TABLET | Freq: Every day | ORAL | 1 refills | Status: DC

## 2018-08-28 ENCOUNTER — Other Ambulatory Visit: Payer: Self-pay

## 2018-08-28 ENCOUNTER — Encounter (HOSPITAL_COMMUNITY)
Admission: RE | Admit: 2018-08-28 | Discharge: 2018-08-28 | Disposition: A | Discharge status: Home / Self Care | Payer: 59 | Source: Ambulatory Visit | Attending: Obstetrics & Gynecology | Admitting: Obstetrics & Gynecology

## 2018-08-28 ENCOUNTER — Encounter (HOSPITAL_COMMUNITY): Payer: Self-pay

## 2018-08-28 DIAGNOSIS — Z01812 Encounter for preprocedural laboratory examination: Secondary | ICD-10-CM | POA: Insufficient documentation

## 2018-08-28 DIAGNOSIS — N950 Postmenopausal bleeding: Secondary | ICD-10-CM | POA: Diagnosis not present

## 2018-08-28 LAB — COMPREHENSIVE METABOLIC PANEL
ALT: 14 U/L (ref 0–44)
AST: 27 U/L (ref 15–41)
Albumin: 4 g/dL (ref 3.5–5.0)
Alkaline Phosphatase: 52 U/L (ref 38–126)
Anion gap: 6 (ref 5–15)
BUN: 13 mg/dL (ref 6–20)
CO2: 23 mmol/L (ref 22–32)
Calcium: 8.6 mg/dL — ABNORMAL LOW (ref 8.9–10.3)
Chloride: 110 mmol/L (ref 98–111)
Creatinine, Ser: 0.5 mg/dL (ref 0.44–1.00)
GFR calc Af Amer: >60 mL/min (ref >60)
GFR calc non Af Amer: >60 mL/min (ref >60)
Glucose, Bld: 91 mg/dL (ref 70–99)
Potassium: 4.1 mmol/L (ref 3.5–5.1)
Sodium: 139 mmol/L (ref 135–145)
Total Bilirubin: 0.4 mg/dL (ref 0.3–1.2)
Total Protein: 6.5 g/dL (ref 6.5–8.1)

## 2018-08-28 LAB — CBC
HCT: 42.4 % (ref 36.0–46.0)
Hemoglobin: 13.3 g/dL (ref 12.0–15.0)
MCH: 31.5 pg (ref 26.0–34.0)
MCHC: 31.4 g/dL (ref 30.0–36.0)
MCV: 100.5 fL — ABNORMAL HIGH (ref 80.0–100.0)
Platelets: 192 10*3/uL (ref 150–400)
RBC: 4.22 MIL/uL (ref 3.87–5.11)
RDW: 12.9 % (ref 11.5–15.5)
WBC: 4.2 10*3/uL (ref 4.0–10.5)
nRBC: 0 % (ref 0.0–0.2)

## 2018-08-29 ENCOUNTER — Other Ambulatory Visit: Payer: Self-pay | Admitting: Family Medicine

## 2018-08-29 NOTE — Progress Notes (Signed)
Spoke with: Vinnie Level NPO:  After Midnight, no gum, candy, or mints   Arrival time: 0530AM Labs: (CBC and CMP, ECHO 08/21/2017 in epic) AM medications: Duloxetine, Use Inhaler per normal routine, Bring Asthma Inhaler day of surgey Pre op orders:  Yes Ride home:

## 2018-08-31 NOTE — Anesthesia Preprocedure Evaluation (Addendum)
Anesthesia Evaluation  Patient identified by MRN, date of birth, ID band Patient awake    Reviewed: Allergy & Precautions, NPO status , Patient's Chart, lab work & pertinent test results  History of Anesthesia Complications Negative for: history of anesthetic complications  Airway Mallampati: II  TM Distance: >3 FB Neck ROM: Full    Dental  (+) Dental Advisory Given, Missing, Implants,    Pulmonary COPD,  COPD inhaler, former smoker,    Pulmonary exam normal breath sounds clear to auscultation       Cardiovascular Normal cardiovascular exam+ Valvular Problems/Murmurs MVP  Rhythm:Regular Rate:Normal     Neuro/Psych  Headaches, Cervical DJD    GI/Hepatic Neg liver ROS, PUD, GERD  Controlled,  Endo/Other  negative endocrine ROS  Renal/GU negative Renal ROS     Musculoskeletal  (+) Arthritis , Osteoarthritis,  Fibromyalgia -  Abdominal   Peds  Hematology negative hematology ROS (+)   Anesthesia Other Findings Day of surgery medications reviewed with the patient.  Reproductive/Obstetrics                            Anesthesia Physical Anesthesia Plan  ASA: II  Anesthesia Plan: General   Post-op Pain Management:    Induction: Intravenous  PONV Risk Score and Plan: 3 and Treatment may vary due to age or medical condition, Dexamethasone, Ondansetron and Midazolam  Airway Management Planned: Oral ETT  Additional Equipment:   Intra-op Plan:   Post-operative Plan: Extubation in OR  Informed Consent: I have reviewed the patients History and Physical, chart, labs and discussed the procedure including the risks, benefits and alternatives for the proposed anesthesia with the patient or authorized representative who has indicated his/her understanding and acceptance.   Dental advisory given  Plan Discussed with: CRNA  Anesthesia Plan Comments:        Anesthesia Quick Evaluation

## 2018-09-01 ENCOUNTER — Encounter (HOSPITAL_COMMUNITY)
Admission: RE | Disposition: A | Discharge status: Home / Self Care | Payer: Self-pay | Source: Home / Self Care | Attending: Obstetrics & Gynecology

## 2018-09-01 ENCOUNTER — Encounter (HOSPITAL_BASED_OUTPATIENT_CLINIC_OR_DEPARTMENT_OTHER): Payer: Self-pay | Admitting: *Deleted

## 2018-09-01 ENCOUNTER — Ambulatory Visit (HOSPITAL_BASED_OUTPATIENT_CLINIC_OR_DEPARTMENT_OTHER): Payer: 59 | Admitting: Anesthesiology

## 2018-09-01 ENCOUNTER — Ambulatory Visit (HOSPITAL_BASED_OUTPATIENT_CLINIC_OR_DEPARTMENT_OTHER)
Admission: RE | Admit: 2018-09-01 | Discharge: 2018-09-02 | Disposition: A | Discharge status: Home / Self Care | Payer: 59 | Attending: Obstetrics & Gynecology | Admitting: Obstetrics & Gynecology

## 2018-09-01 ENCOUNTER — Other Ambulatory Visit: Payer: Self-pay

## 2018-09-01 DIAGNOSIS — M797 Fibromyalgia: Secondary | ICD-10-CM | POA: Insufficient documentation

## 2018-09-01 DIAGNOSIS — Z79890 Hormone replacement therapy (postmenopausal): Secondary | ICD-10-CM | POA: Insufficient documentation

## 2018-09-01 DIAGNOSIS — Z79899 Other long term (current) drug therapy: Secondary | ICD-10-CM | POA: Diagnosis not present

## 2018-09-01 DIAGNOSIS — Z7982 Long term (current) use of aspirin: Secondary | ICD-10-CM | POA: Insufficient documentation

## 2018-09-01 DIAGNOSIS — J439 Emphysema, unspecified: Secondary | ICD-10-CM | POA: Diagnosis not present

## 2018-09-01 DIAGNOSIS — Z791 Long term (current) use of non-steroidal anti-inflammatories (NSAID): Secondary | ICD-10-CM | POA: Insufficient documentation

## 2018-09-01 DIAGNOSIS — E785 Hyperlipidemia, unspecified: Secondary | ICD-10-CM | POA: Diagnosis not present

## 2018-09-01 DIAGNOSIS — N72 Inflammatory disease of cervix uteri: Secondary | ICD-10-CM | POA: Diagnosis not present

## 2018-09-01 DIAGNOSIS — Z888 Allergy status to other drugs, medicaments and biological substances status: Secondary | ICD-10-CM | POA: Insufficient documentation

## 2018-09-01 DIAGNOSIS — Z886 Allergy status to analgesic agent status: Secondary | ICD-10-CM | POA: Diagnosis not present

## 2018-09-01 DIAGNOSIS — N950 Postmenopausal bleeding: Secondary | ICD-10-CM | POA: Diagnosis present

## 2018-09-01 DIAGNOSIS — Y839 Surgical procedure, unspecified as the cause of abnormal reaction of the patient, or of later complication, without mention of misadventure at the time of the procedure: Secondary | ICD-10-CM | POA: Insufficient documentation

## 2018-09-01 DIAGNOSIS — N888 Other specified noninflammatory disorders of cervix uteri: Secondary | ICD-10-CM | POA: Insufficient documentation

## 2018-09-01 DIAGNOSIS — Z87891 Personal history of nicotine dependence: Secondary | ICD-10-CM | POA: Diagnosis not present

## 2018-09-01 DIAGNOSIS — S3141XA Laceration without foreign body of vagina and vulva, initial encounter: Secondary | ICD-10-CM | POA: Diagnosis not present

## 2018-09-01 DIAGNOSIS — N800 Endometriosis of uterus: Secondary | ICD-10-CM | POA: Diagnosis not present

## 2018-09-01 DIAGNOSIS — K219 Gastro-esophageal reflux disease without esophagitis: Secondary | ICD-10-CM | POA: Diagnosis not present

## 2018-09-01 HISTORY — PX: CYSTOSCOPY: SHX5120

## 2018-09-01 HISTORY — DX: Emphysema, unspecified: J43.9

## 2018-09-01 HISTORY — PX: TOTAL LAPAROSCOPIC HYSTERECTOMY WITH SALPINGECTOMY: SHX6742

## 2018-09-01 LAB — CBC
HCT: 37.2 % (ref 36.0–46.0)
Hemoglobin: 11.7 g/dL — ABNORMAL LOW (ref 12.0–15.0)
MCH: 32.3 pg (ref 26.0–34.0)
MCHC: 31.5 g/dL (ref 30.0–36.0)
MCV: 102.8 fL — ABNORMAL HIGH (ref 80.0–100.0)
Platelets: 149 10*3/uL — ABNORMAL LOW (ref 150–400)
RBC: 3.62 MIL/uL — ABNORMAL LOW (ref 3.87–5.11)
RDW: 12.8 % (ref 11.5–15.5)
WBC: 10.4 10*3/uL (ref 4.0–10.5)
nRBC: 0 % (ref 0.0–0.2)

## 2018-09-01 LAB — GLUCOSE, CAPILLARY: Glucose-Capillary: 165 mg/dL — ABNORMAL HIGH (ref 70–99)

## 2018-09-01 SURGERY — HYSTERECTOMY, TOTAL, LAPAROSCOPIC, WITH SALPINGECTOMY
Anesthesia: General | Site: Bladder

## 2018-09-01 MED ORDER — UMECLIDINIUM-VILANTEROL 62.5-25 MCG/INH IN AEPB
1.0000 | INHALATION_SPRAY | Freq: Every day | RESPIRATORY_TRACT | Status: DC
  Filled 2018-09-01: qty 14

## 2018-09-01 MED ORDER — ONDANSETRON HCL 4 MG/2ML IJ SOLN
INTRAMUSCULAR | Status: DC | PRN
  Administered 2018-09-01: 4 mg via INTRAVENOUS

## 2018-09-01 MED ORDER — ROCURONIUM BROMIDE 100 MG/10ML IV SOLN
INTRAVENOUS | Status: DC | PRN
  Administered 2018-09-01 (×2): 10 mg via INTRAVENOUS
  Administered 2018-09-01: 20 mg via INTRAVENOUS
  Administered 2018-09-01: 30 mg via INTRAVENOUS

## 2018-09-01 MED ORDER — FENTANYL CITRATE (PF) 100 MCG/2ML IJ SOLN
INTRAMUSCULAR | Status: AC
  Filled 2018-09-01: qty 2

## 2018-09-01 MED ORDER — MENTHOL 3 MG MT LOZG
1.0000 | LOZENGE | OROMUCOSAL | Status: DC | PRN
  Filled 2018-09-01: qty 9

## 2018-09-01 MED ORDER — SIMETHICONE 80 MG PO CHEW
80.0000 mg | CHEWABLE_TABLET | Freq: Four times a day (QID) | ORAL | Status: DC | PRN
  Administered 2018-09-02: 80 mg via ORAL
  Filled 2018-09-01: qty 1

## 2018-09-01 MED ORDER — ALBUTEROL SULFATE HFA 108 (90 BASE) MCG/ACT IN AERS: 2.0000 | INHALATION_SPRAY | RESPIRATORY_TRACT | Status: DC | PRN

## 2018-09-01 MED ORDER — ONDANSETRON HCL 4 MG/2ML IJ SOLN: 4.0000 mg | Freq: Four times a day (QID) | INTRAMUSCULAR | Status: DC | PRN

## 2018-09-01 MED ORDER — MIDAZOLAM HCL 2 MG/2ML IJ SOLN
INTRAMUSCULAR | Status: AC
  Filled 2018-09-01: qty 2

## 2018-09-01 MED ORDER — ONDANSETRON HCL 4 MG/2ML IJ SOLN
4.0000 mg | Freq: Four times a day (QID) | INTRAMUSCULAR | Status: DC
  Administered 2018-09-01: 4 mg via INTRAVENOUS
  Filled 2018-09-01: qty 2

## 2018-09-01 MED ORDER — PROPOFOL 10 MG/ML IV BOLUS
INTRAVENOUS | Status: AC
  Filled 2018-09-01: qty 40

## 2018-09-01 MED ORDER — SUGAMMADEX SODIUM 200 MG/2ML IV SOLN
INTRAVENOUS | Status: AC
  Filled 2018-09-01: qty 2

## 2018-09-01 MED ORDER — ENOXAPARIN SODIUM 40 MG/0.4ML ~~LOC~~ SOLN
40.0000 mg | SUBCUTANEOUS | Status: DC
  Administered 2018-09-02: 40 mg via SUBCUTANEOUS
  Filled 2018-09-01: qty 0.4

## 2018-09-01 MED ORDER — SODIUM CHLORIDE 0.9 % IV SOLN
INTRAVENOUS | Status: AC
  Filled 2018-09-01: qty 2

## 2018-09-01 MED ORDER — DEXAMETHASONE SODIUM PHOSPHATE 4 MG/ML IJ SOLN
INTRAMUSCULAR | Status: DC | PRN
  Administered 2018-09-01: 10 mg via INTRAVENOUS

## 2018-09-01 MED ORDER — IBUPROFEN 800 MG PO TABS: 800.0000 mg | ORAL_TABLET | Freq: Four times a day (QID) | ORAL | Status: DC

## 2018-09-01 MED ORDER — OXYCODONE HCL 5 MG/5ML PO SOLN
5.0000 mg | Freq: Once | ORAL | Status: DC | PRN
  Filled 2018-09-01: qty 5

## 2018-09-01 MED ORDER — SODIUM CHLORIDE 0.9 % IV SOLN
2.0000 g | INTRAVENOUS | Status: AC
  Administered 2018-09-01: 2 g via INTRAVENOUS
  Filled 2018-09-01: qty 2

## 2018-09-01 MED ORDER — BUPIVACAINE HCL (PF) 0.25 % IJ SOLN
INTRAMUSCULAR | Status: DC | PRN
  Administered 2018-09-01: 8 mL

## 2018-09-01 MED ORDER — ENOXAPARIN SODIUM 40 MG/0.4ML ~~LOC~~ SOLN
40.0000 mg | SUBCUTANEOUS | Status: AC
  Administered 2018-09-01: 40 mg via SUBCUTANEOUS
  Filled 2018-09-01: qty 0.4

## 2018-09-01 MED ORDER — CHLORZOXAZONE 500 MG PO TABS
500.0000 mg | ORAL_TABLET | Freq: Four times a day (QID) | ORAL | Status: DC | PRN
  Filled 2018-09-01: qty 1

## 2018-09-01 MED ORDER — DULOXETINE HCL 60 MG PO CPEP
60.0000 mg | ORAL_CAPSULE | Freq: Every morning | ORAL | Status: DC
  Administered 2018-09-02: 60 mg via ORAL
  Filled 2018-09-01: qty 1

## 2018-09-01 MED ORDER — SUGAMMADEX SODIUM 200 MG/2ML IV SOLN
INTRAVENOUS | Status: DC | PRN
  Administered 2018-09-01: 200 mg via INTRAVENOUS

## 2018-09-01 MED ORDER — LACTATED RINGERS IV SOLN
INTRAVENOUS | Status: DC
  Administered 2018-09-01 (×2): via INTRAVENOUS
  Filled 2018-09-01: qty 1000

## 2018-09-01 MED ORDER — PROMETHAZINE HCL 25 MG/ML IJ SOLN
6.2500 mg | INTRAMUSCULAR | Status: DC | PRN
  Filled 2018-09-01: qty 1

## 2018-09-01 MED ORDER — KETOROLAC TROMETHAMINE 30 MG/ML IJ SOLN
30.0000 mg | Freq: Four times a day (QID) | INTRAMUSCULAR | Status: DC
  Administered 2018-09-01 – 2018-09-02 (×3): 30 mg via INTRAVENOUS
  Filled 2018-09-01 (×3): qty 1

## 2018-09-01 MED ORDER — DEXAMETHASONE SODIUM PHOSPHATE 10 MG/ML IJ SOLN
INTRAMUSCULAR | Status: AC
  Filled 2018-09-01: qty 1

## 2018-09-01 MED ORDER — MIDAZOLAM HCL 5 MG/5ML IJ SOLN
INTRAMUSCULAR | Status: DC | PRN
  Administered 2018-09-01: 2 mg via INTRAVENOUS

## 2018-09-01 MED ORDER — ACETAMINOPHEN 10 MG/ML IV SOLN
1000.0000 mg | Freq: Once | INTRAVENOUS | Status: DC | PRN
  Administered 2018-09-01: 1000 mg via INTRAVENOUS
  Filled 2018-09-01: qty 100

## 2018-09-01 MED ORDER — FENTANYL CITRATE (PF) 100 MCG/2ML IJ SOLN
25.0000 ug | INTRAMUSCULAR | Status: DC | PRN
  Administered 2018-09-01 (×2): 50 ug via INTRAVENOUS
  Filled 2018-09-01: qty 1

## 2018-09-01 MED ORDER — ALUM & MAG HYDROXIDE-SIMETH 200-200-20 MG/5ML PO SUSP: 30.0000 mL | ORAL | Status: DC | PRN

## 2018-09-01 MED ORDER — ENOXAPARIN SODIUM 40 MG/0.4ML ~~LOC~~ SOLN
SUBCUTANEOUS | Status: AC
  Filled 2018-09-01: qty 0.4

## 2018-09-01 MED ORDER — OXYCODONE HCL 5 MG PO TABS
5.0000 mg | ORAL_TABLET | Freq: Once | ORAL | Status: DC | PRN
  Filled 2018-09-01: qty 1

## 2018-09-01 MED ORDER — PROMETHAZINE HCL 25 MG/ML IJ SOLN
6.2500 mg | INTRAMUSCULAR | Status: DC | PRN
  Administered 2018-09-01: 6.25 mg via INTRAVENOUS
  Filled 2018-09-01: qty 1

## 2018-09-01 MED ORDER — ONDANSETRON HCL 4 MG/2ML IJ SOLN
INTRAMUSCULAR | Status: AC
  Filled 2018-09-01: qty 2

## 2018-09-01 MED ORDER — PANTOPRAZOLE SODIUM 40 MG IV SOLR: 40.0000 mg | Freq: Every day | INTRAVENOUS | Status: DC

## 2018-09-01 MED ORDER — SODIUM CHLORIDE 0.9 % IV SOLN
INTRAVENOUS | Status: DC | PRN
  Administered 2018-09-01: 60 mL

## 2018-09-01 MED ORDER — DEXTROSE-NACL 5-0.45 % IV SOLN
INTRAVENOUS | Status: DC
  Administered 2018-09-01 – 2018-09-02 (×4): via INTRAVENOUS

## 2018-09-01 MED ORDER — HYDROCODONE-ACETAMINOPHEN 5-325 MG PO TABS
1.0000 | ORAL_TABLET | ORAL | Status: DC | PRN
  Administered 2018-09-01 – 2018-09-02 (×5): 1 via ORAL
  Filled 2018-09-01 (×5): qty 1

## 2018-09-01 MED ORDER — PROPOFOL 10 MG/ML IV BOLUS
INTRAVENOUS | Status: DC | PRN
  Administered 2018-09-01 (×2): 100 mg via INTRAVENOUS

## 2018-09-01 MED ORDER — FENTANYL CITRATE (PF) 100 MCG/2ML IJ SOLN
INTRAMUSCULAR | Status: DC | PRN
  Administered 2018-09-01: 50 ug via INTRAVENOUS
  Administered 2018-09-01: 25 ug via INTRAVENOUS
  Administered 2018-09-01: 50 ug via INTRAVENOUS
  Administered 2018-09-01 (×3): 25 ug via INTRAVENOUS
  Administered 2018-09-01 (×2): 50 ug via INTRAVENOUS

## 2018-09-01 MED ORDER — ALBUTEROL SULFATE (2.5 MG/3ML) 0.083% IN NEBU: 2.5000 mg | INHALATION_SOLUTION | Freq: Four times a day (QID) | RESPIRATORY_TRACT | Status: DC | PRN

## 2018-09-01 MED ORDER — ACETAMINOPHEN 10 MG/ML IV SOLN
INTRAVENOUS | Status: AC
  Filled 2018-09-01: qty 100

## 2018-09-01 MED ORDER — LIDOCAINE 2% (20 MG/ML) 5 ML SYRINGE
INTRAMUSCULAR | Status: AC
  Filled 2018-09-01: qty 5

## 2018-09-01 MED ORDER — LACTATED RINGERS IV BOLUS
500.0000 mL | Freq: Once | INTRAVENOUS | Status: AC
  Administered 2018-09-01: 500 mL via INTRAVENOUS

## 2018-09-01 MED ORDER — BUTALBITAL-APAP-CAFFEINE 50-325-40 MG PO TABS
1.0000 | ORAL_TABLET | Freq: Four times a day (QID) | ORAL | Status: DC | PRN
  Administered 2018-09-01 – 2018-09-02 (×2): 1 via ORAL
  Filled 2018-09-01 (×2): qty 1

## 2018-09-01 MED ORDER — LIDOCAINE HCL (CARDIAC) PF 100 MG/5ML IV SOSY
PREFILLED_SYRINGE | INTRAVENOUS | Status: DC | PRN
  Administered 2018-09-01: 40 mg via INTRAVENOUS

## 2018-09-01 MED ORDER — ROCURONIUM BROMIDE 10 MG/ML (PF) SYRINGE
PREFILLED_SYRINGE | INTRAVENOUS | Status: AC
  Filled 2018-09-01: qty 10

## 2018-09-01 MED ORDER — CYCLOBENZAPRINE HCL 10 MG PO TABS: 10.0000 mg | ORAL_TABLET | Freq: Every day | ORAL | Status: DC

## 2018-09-01 MED ORDER — KETOROLAC TROMETHAMINE 30 MG/ML IJ SOLN
INTRAMUSCULAR | Status: DC | PRN
  Administered 2018-09-01: 30 mg via INTRAVENOUS

## 2018-09-01 SURGICAL SUPPLY — 57 items
APPLICATOR ARISTA FLEXITIP XL (MISCELLANEOUS) ×3 IMPLANT
CABLE HIGH FREQUENCY MONO STRZ (ELECTRODE) IMPLANT
COVER BACK TABLE 80X110 HD (DRAPES) ×3 IMPLANT
COVER MAYO STAND STRL (DRAPES) ×3 IMPLANT
COVER SURGICAL LIGHT HANDLE (MISCELLANEOUS) IMPLANT
COVER WAND RF STERILE (DRAPES) ×3 IMPLANT
DERMABOND ADVANCED (GAUZE/BANDAGES/DRESSINGS) ×1
DERMABOND ADVANCED .7 DNX12 (GAUZE/BANDAGES/DRESSINGS) ×2 IMPLANT
DILATOR CANAL MILEX (MISCELLANEOUS) IMPLANT
DRSG COVADERM PLUS 2X2 (GAUZE/BANDAGES/DRESSINGS) IMPLANT
DRSG OPSITE POSTOP 3X4 (GAUZE/BANDAGES/DRESSINGS) IMPLANT
DURAPREP 26ML APPLICATOR (WOUND CARE) ×3 IMPLANT
GAUZE 4X4 16PLY RFD (DISPOSABLE) ×3 IMPLANT
GLOVE BIOGEL PI IND STRL 7.0 (GLOVE) ×4 IMPLANT
GLOVE BIOGEL PI INDICATOR 7.0 (GLOVE) ×2
GLOVE ECLIPSE 6.5 STRL STRAW (GLOVE) ×6 IMPLANT
GOWN STRL REUS W/TWL XL LVL3 (GOWN DISPOSABLE) ×6 IMPLANT
HEMOSTAT ARISTA ABSORB 3G PWDR (MISCELLANEOUS) ×3 IMPLANT
LIGASURE VESSEL 5MM BLUNT TIP (ELECTROSURGICAL) ×3 IMPLANT
NEEDLE INSUFFLATION 120MM (ENDOMECHANICALS) ×3 IMPLANT
NS IRRIG 1000ML POUR BTL (IV SOLUTION) ×3 IMPLANT
OCCLUDER COLPOPNEUMO (BALLOONS) ×3 IMPLANT
PACK LAPAROSCOPY BASIN (CUSTOM PROCEDURE TRAY) ×3 IMPLANT
PACK TRENDGUARD 450 HYBRID PRO (MISCELLANEOUS) ×2 IMPLANT
PENCIL BUTTON HOLSTER BLD 10FT (ELECTRODE) ×3 IMPLANT
POUCH LAPAROSCOPIC INSTRUMENT (MISCELLANEOUS) ×3 IMPLANT
PROTECTOR NERVE ULNAR (MISCELLANEOUS) ×6 IMPLANT
SCISSORS LAP 5X35 DISP (ENDOMECHANICALS) IMPLANT
SET IRRIG TUBING LAPAROSCOPIC (IRRIGATION / IRRIGATOR) ×6 IMPLANT
SET IRRIG Y TYPE TUR BLADDER L (SET/KITS/TRAYS/PACK) ×3 IMPLANT
SET TRI-LUMEN FLTR TB AIRSEAL (TUBING) ×3 IMPLANT
SHEARS HARMONIC ACE PLUS 36CM (ENDOMECHANICALS) ×3 IMPLANT
SOLUTION ELECTROLUBE (MISCELLANEOUS) IMPLANT
SPONGE SURGIFOAM ABS GEL 12-7 (HEMOSTASIS) ×3 IMPLANT
SUT VIC AB 0 CT1 27 (SUTURE) ×2
SUT VIC AB 0 CT1 27XBRD ANBCTR (SUTURE) ×4 IMPLANT
SUT VIC AB 3-0 SH 27 (SUTURE) ×1
SUT VIC AB 3-0 SH 27XBRD (SUTURE) ×2 IMPLANT
SUT VICRYL 0 UR6 27IN ABS (SUTURE) IMPLANT
SUT VICRYL 4-0 PS2 18IN ABS (SUTURE) ×3 IMPLANT
SUT VLOC 180 0 9IN  GS21 (SUTURE) ×2
SUT VLOC 180 0 9IN GS21 (SUTURE) ×4 IMPLANT
SYR 10ML LL (SYRINGE) ×3 IMPLANT
SYR 50ML LL SCALE MARK (SYRINGE) ×6 IMPLANT
SYSTEM CARTER THOMASON II (TROCAR) IMPLANT
TIP UTERINE 5.1X6CM LAV DISP (MISCELLANEOUS) IMPLANT
TIP UTERINE 6.7X10CM GRN DISP (MISCELLANEOUS) IMPLANT
TIP UTERINE 6.7X6CM WHT DISP (MISCELLANEOUS) IMPLANT
TIP UTERINE 6.7X8CM BLUE DISP (MISCELLANEOUS) ×3 IMPLANT
TOWEL OR 17X24 6PK STRL BLUE (TOWEL DISPOSABLE) ×6 IMPLANT
TRAY FOLEY W/BAG SLVR 14FR (SET/KITS/TRAYS/PACK) ×3 IMPLANT
TRENDGUARD 450 HYBRID PRO PACK (MISCELLANEOUS) ×3
TROCAR ADV FIXATION 5X100MM (TROCAR) ×3 IMPLANT
TROCAR PORT AIRSEAL 5X120 (TROCAR) ×3 IMPLANT
TROCAR XCEL NON BLADE 8MM B8LT (ENDOMECHANICALS) ×3 IMPLANT
TROCAR XCEL NON-BLD 5MMX100MML (ENDOMECHANICALS) ×3 IMPLANT
WARMER LAPAROSCOPE (MISCELLANEOUS) ×3 IMPLANT

## 2018-09-01 NOTE — Progress Notes (Signed)
Patient was given phenergan 6.25 mg IV per MD order d/t continued severe nausea. Patient then requested to go to BR to void d/t c/o abdominal cramping and "pressure" d/t "full bladder". RN offered bed pan, patient refused. Patient assisted to edge of bed to dangle, then ambulated to BR with assistance of 2 RNs. Upon sitting on toilet, patient immediately stated "I'm going to pass out, I feel dizzy", and patient lost consciousness with RNs at side. Patient did not respond in any way to verbal or physical stimuli. RNs and family member assisted patient back to bed during which time patient regained consciousness and was placed in trendelenburg position. Rapid response team was called to the room, vitals were obtained, and a CBG of 165 was obtained. Patient became alert but very pale, lethargic, still c/o dizziness. Rapid response RNs obtained attempted to obtain orthostatic v/s but were unable d/t patient could not tolerate sitting up on edge of bed without c/o feeling as if she were going to have another syncopal episode. Patient did display orthostatic b/p according to rapid response RNs. Nursing supervisor of hospital also notified and present, Dr. Sabra Heck notified and present at bedside. LR bolus initiated, orders to transfer patient to telemetry unit obtained. No signs of vaginal/perineal bleeding noted. Will continue to monitor. Lyndel Pleasure RN

## 2018-09-01 NOTE — Telephone Encounter (Signed)
Dr. Fry please advise on refill. Thanks  

## 2018-09-01 NOTE — Transfer of Care (Signed)
Immediate Anesthesia Transfer of Care Note  Patient: Emily Ward  Procedure(s) Performed: Procedure(s) (LRB): TOTAL LAPAROSCOPIC HYSTERECTOMY WITH SALPINGECTOMY  BSO, REPAIR OF PERINEAL LACERATION (Bilateral) CYSTOSCOPY (N/A)  Patient Location: PACU  Anesthesia Type: General  Level of Consciousness: awake, sedated, patient cooperative and responds to stimulation  Airway & Oxygen Therapy: Patient Spontanous Breathing and Patient connected to [place] oxygen  Post-op Assessment: Report given to PACU RN, Post -op Vital signs reviewed and stable and Patient moving all extremities  Post vital signs: Reviewed and stable  Complications: No apparent anesthesia complications

## 2018-09-01 NOTE — Significant Event (Signed)
Rapid Response Event Note  Overview: Time Called: 1228 Arrival Time: 1232 Event Type: Hypotension, Other (Comment)(Syncopal Episode)  Called by 3E outpatient surgery due to patient having syncopal episode with loss of consciousness. Upon arrival pt resting in bed in trendelenburg position. Color was pale. Pt alert and oriented x4. Pt HR was NSR 80s. When assessing orthostatic vitals pt did feel like she was increasingly dizzy and pale. Pt appears to have vagaled with positive orthostatics.  Initial Focused Assessment:  Neuro: Alert Oriented x4, able to move all extremities evenly, Cardiac: NSR 80s BP 102/64 (MAP 74) -Orthostatic Vitals- Sitting- 97/68 (78) HR 84, Sitting after 1 minute 87/59 (69) HR 70 (Did not move on to standing due to patient feeling like she was going to pass out). Respiratory: 95% RA,  Breath sounds clear to auscultation  Interventions: MD Paged to bedside -Stat CBC  -500cc Bolus   Plan of Care (if not transferred): Transfer to Telemetry  Call rapid if patient shows signs of change in clinical status.        Wray Kearns

## 2018-09-01 NOTE — H&P (Signed)
Emily Ward is an 62 y.o. female G2P2 MWF with recurrent PMP bleeding that has been evaluated with ultrasounds, endometrial biopsies (most recently 11/19) and hysteroscopy with D&C that was just about a year ago.  She is on HRT and dosage has been adjusted as well.  She continues to have spotting and is ready to proceed with definitive treatment.  Risks, benefits, alternatives have been discussed.  She is not interested in stopping her HRT so this really is the only option.    Pertinent Gynecological History: Menses: post-menopausal Bleeding: post menopausal bleeding Contraception: post menopausal status DES exposure: denies Blood transfusions: none Sexually transmitted diseases: no past history Previous GYN Procedures: hysteroscopy with D&C  Last mammogram: normal Date: 06/30/18 Last pap: normal Date: 07/15/18 OB History: G2, P2   Menstrual History: Patient's last menstrual period was 09/03/2000 (approximate).    Past Medical History:  Diagnosis Date  . Cervical disc disease 2006  . Emphysema lung ([place])   . 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    . Fibromyalgia    sees Dr. Estanislado Pandy   . Heart murmur   . 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 Hyperlipidemia   . Migraines   . MVP (mitral valve prolapse)    no daily medication  . Peptic ulcer    no problems    Past Surgical History:  Procedure Laterality Date  . CERVICAL LAMINECTOMY  2006  . CESAREAN SECTION  1993  . CHOLECYSTECTOMY  06/2002  . DILATATION & CURETTAGE/HYSTEROSCOPY WITH MYOSURE N/A 04/12/2017   Procedure: DILATATION & CURETTAGE/HYSTEROSCOPY;  Surgeon: Megan Salon, MD;  Location: [facility];  Service: Gynecology;  Laterality: N/A;  . DILATION AND CURETTAGE OF UTERUS    . HYSTEROSCOPY     years ago with Dr Radene Knee-? 2000  . URETHRAL DILATION      Family History  Problem Relation Age of Onset  . Breast cancer Mother         lumpectomy in late 50/60  . Heart attack Father   . Congestive Heart Failure Father   . Cancer Other        breast/fhx  . Heart disease Other        fhx  . Diabetes type II Sister        and ? brother  . Arthritis Son        psoriatic  . Colon cancer Neg Hx   . Esophageal cancer Neg Hx   . Rectal cancer Neg Hx   . Stomach cancer Neg Hx     Social History:  reports that she quit smoking about 5 years ago. Her smoking use included cigarettes. She has a 20.00 pack-year smoking history. She has never used smokeless tobacco. She reports current alcohol use of about 1.0 standard drinks of alcohol per week. She reports that she does not use drugs.  Allergies:  Allergies  Allergen Reactions  . Depakote [Divalproex Sodium]     Drug inducted hepatitis  . Nsaids     Upset stomach    Medications Prior to Admission  Medication Sig Dispense Refill Last Dose  . aspirin 81 MG tablet Take 81 mg by mouth daily.     08/23/2018  . butalbital-acetaminophen-caffeine (FIORICET, ESGIC) 50-325-40 MG tablet TAKE 1 TABLET BY MOUTH EVERY 6 HOURS AS NEEDED (Patient taking differently: 1 tablet every 6 (six) hours as needed for headache. ) 120 tablet 5 08/31/2018 at 1800  .  cetirizine (ZYRTEC) 10 MG tablet Take 10 mg by mouth daily as needed for allergies (spring time).    Past Week at Unknown time  . chlorzoxazone (PARAFON) 500 MG tablet TAKE 1 TABLET BY MOUTH FOUR TIMES DAILY AS NEEDED FOR MUSCLE SPASMS (Patient taking differently: Take 500 mg by mouth 4 (four) times daily as needed. ) 120 tablet 3 Past Week at Unknown time  . cholecalciferol (VITAMIN D) 1000 units tablet Take 1,000 Units by mouth daily.   08/31/2018 at Unknown time  . Cyanocobalamin (VITAMIN B-12 PO) Take 1 tablet by mouth daily.    08/31/2018 at Unknown time  . cyclobenzaprine (FLEXERIL) 10 MG tablet Take 1 tablet (10 mg total) by mouth at bedtime. for muscle spams (Patient taking differently: Take 10 mg by mouth at bedtime as needed for  muscle spasms. for muscle spams) 30 tablet 5 08/31/2018 at Unknown time  . DULoxetine (CYMBALTA) 30 MG capsule TAKE 2 CAPSULES BY MOUTH EVERY MORNING 180 capsule 0 09/01/2018 at Unknown time  . estradiol (ESTRACE) 1 MG tablet TAKE 1 TABLET(1 MG) BY MOUTH DAILY (Patient taking differently: Take 1 mg by mouth daily. ) 30 tablet 12 08/31/2018 at Unknown time  . medroxyPROGESTERone (PROVERA) 5 MG tablet Take 1 tablet (5 mg total) by mouth daily. 30 tablet 0 08/31/2018 at Unknown time  . meloxicam (MOBIC) 7.5 MG tablet TAKE 1 TABLET(7.5 MG) BY MOUTH DAILY 90 tablet 3 Past Week at Unknown time  . umeclidinium-vilanterol (ANORO ELLIPTA) 62.5-25 MCG/INH AEPB Inhale 1 puff into the lungs daily. 1 each 0 Past Week at Unknown time  . zolmitriptan (ZOMIG) 5 MG tablet TAKE 1 TABLET BY MOUTH AS NEEDED (Patient taking differently: Take 5 mg by mouth as needed for migraine. ) 30 tablet 5 Past Month at Unknown time  . zolpidem (AMBIEN) 10 MG tablet Take 1 tablet (10 mg total) by mouth at bedtime. 90 tablet 1 08/31/2018 at Unknown time  . albuterol (PROVENTIL HFA;VENTOLIN HFA) 108 (90 Base) MCG/ACT inhaler Inhale 2 puffs into the lungs every 4 (four) hours as needed for wheezing or shortness of breath. 1 Inhaler 5 Unknown at Unknown time  . denosumab (PROLIA) 60 MG/ML SOSY injection Inject 60 mg into the skin every 6 (six) months.   More than a month at Unknown time    Review of Systems  All other systems reviewed and are negative.   Blood pressure 114/83, pulse 76, temperature 99.3 F (37.4 C), temperature source Oral, resp. rate 16, height 5\' 5"  (1.651 m), weight 47.4 kg, last menstrual period 09/03/2000, SpO2 97 %. Physical Exam  Constitutional: She is oriented to person, place, and time. She appears well-developed and well-nourished.  Cardiovascular: Normal rate and regular rhythm.  Respiratory: Effort normal and breath sounds normal.  Neurological: She is alert and oriented to person, place, and time.   Skin: Skin is warm and dry.  Psychiatric: She has a normal mood and affect.    No results found for this or any previous visit (from the past 24 hour(s)).  No results found.  Assessment/Plan: 62 yo G2P2 MWF with recurrent PMP Bleeding and negative evaluation over the past two, plus, years here for definitive surgery with TLH/BSO/cystoscopy.  Procedure reviewed.  Questions answered.  Pt ready to proceed.  Megan Salon 09/01/2018, 7:18 AM

## 2018-09-01 NOTE — Anesthesia Postprocedure Evaluation (Signed)
Anesthesia Post Note  Patient: Emily Ward  Procedure(s) Performed: TOTAL LAPAROSCOPIC HYSTERECTOMY WITH SALPINGECTOMY  BSO, REPAIR OF PERINEAL LACERATION (Bilateral Abdomen) CYSTOSCOPY (N/A Bladder)     Patient location during evaluation: PACU Anesthesia Type: General Level of consciousness: awake and alert Pain management: pain level controlled Vital Signs Assessment: post-procedure vital signs reviewed and stable Respiratory status: spontaneous breathing, nonlabored ventilation and respiratory function stable Cardiovascular status: blood pressure returned to baseline and stable Postop Assessment: no apparent nausea or vomiting Anesthetic complications: no    Last Vitals:  Vitals:   09/01/18 1023 09/01/18 1030  BP:  111/62  Pulse: 90 84  Resp: 20 15  Temp:    SpO2: 99% 98%    Last Pain:  Vitals:   09/01/18 1030  TempSrc:   PainSc: [place]

## 2018-09-01 NOTE — Op Note (Signed)
09/01/2018  9:56 AM  PATIENT:  Emily Ward  62 y.o. female  PRE-OPERATIVE DIAGNOSIS:  recurrent PMB  POST-OPERATIVE DIAGNOSIS:  recurrent PMB  PROCEDURE:  Procedure(s): TOTAL LAPAROSCOPIC HYSTERECTOMY WITH SALPINGECTOMY  BSO, REPAIR OF PERINEAL LACERATION CYSTOSCOPY  SURGEON:  Megan Salon  ASSISTANTS: Sumner Boast, MD.  MD assistant was necessary for tissue manipulation, retraction and positioning due to the complexity of the case and hospital policies.     ANESTHESIA:   general  ESTIMATED BLOOD LOSS: 50 mL  BLOOD ADMINISTERED:none   FLUIDS: 1000cc LR  UOP: 125cc clear UOP  SPECIMEN:  Uterus, cervix, bilateral fallopian tubes and ovaries  DISPOSITION OF SPECIMEN:  PATHOLOGY  FINDINGS: normal, atrophic ovaries, normal uterus, bladder adhesions from prior cesarean section  DESCRIPTION OF OPERATION: Patient is taken to the operating room. She is placed in the supine position. She is a running IV in place. Informed consent was present on the chart. SCDs on her lower extremities and functioning properly. Patient was positioned while she was awake.  Her legs were placed in the low lithotomy position in [place]. Her arms were tucked by the side.  General endotracheal anesthesia was administered by the anesthesia staff without difficulty. Dr. Daiva Huge, anesthesia, oversaw case.  Time out performed.    Chlora prep was then used to prep the abdomen and Betadine was used to prep the inner thighs, perineum and vagina. Once 3 minutes had past the patient was draped in a normal standard fashion. The legs were lifted to the high lithotomy position. The cervix was visualized by placing a heavy weighted speculum in the posterior aspect of the vagina and using a curved Deaver retractor to the retract anteriorly. The anterior lip of the cervix was grasped with single-tooth tenaculum.  The cervix sounded to 10 cm. Pratt dilators were used to dilate the cervix up to a #21. A RUMI  uterine manipulator was obtained. A #8 disposable tip was placed on the RUMI manipulator as well as a 3.0, silver KOH ring. This was passed through the cervix and the bulb of the disposable tip was inflated with 10 cc of normal saline. There was a good fit of the KOH ring around the cervix. The tenaculum was removed. There is also good manipulation of the uterus. The speculum and retractor were removed as well. A Foley catheter was placed to straight drain.  Clear urine was noted. Legs were lowered to the low lithotomy position and attention was turned the abdomen.  The umbilicus was everted.  A Veress needle was obtained. Syringe of sterile saline was placed on a open Veress needle.  This was passed into the umbilicus until just when the fluid started to drip.  Then low flow CO2 gas was attached the needle and the pneumoperitoneum was achieved without difficulty. Once3.0 liters of gas was in the abdomen the Veress needle was removed and a 5 millimeter non-bladed Optiview trocar and port were passed directly to the abdomen. The laparoscope was then used to confirm intraperitoneal placement.  Locations for RLQ, LLQ, and suprapubic ports were noted by transillumination of the abdominal wall.  0.25% marcaine was used to anesthetize the skin.  73mm skin incision was made in the RLQ and an AirSeal port was placed underdirect visualization of the laparoscope.  Then a 4mm skin incision was made and a 4mm nonbladed trochar and port was placed in the LLQ.  Finally, and 24mm skin incision was made about 4cm above the pubic symphasis and an 55mm  non-bladed port was placed with direct visualization of the laparoscope.  All trochars were removed.    Ureters were identifies.  Attention was turned to the left side. With uterus on stretch the left IP ligament was serially clamped, cauterized and incised with the Ligasure device.  Then the left round ligament was serially clamped cauterized and incised. The anterior and posterior  peritoneum of the inferior leaf of the broad ligament were opened. The beginning of the baldder flap was created.  The bladder was taken down below the level of the KOH ring. The left uterine artery skeletonized and then just superior to the KOH ring this vessel was serially clamped and cauterized.  Attention was turned the right side.  The uterus was placed on stretch to the opposite side.  The right IP ligament was serially clamped, cauterized and incised.  Next the right round ligament was serially clamped cauterized and incised. The anterior posterior peritoneum of the inferiorly for the broad ligament were opened. The anterior peritoneum was carried across to the dissection on the left side. The remainder of the bladder flap was created using sharp dissection. The bladder was well below the level of the KOH ring. The right uterine artery skeletonized. Then the right uterine artery, above the level of the KOH ring, was serially clamped cauterized and incised.  The uterus was devascularized at this point.  Attention was turned back to the left side and the uterine artery on the left was then incised as well.    The colpotomy was performed a starting in the midline and using a harmonic scalpel with the inferior edge of the open blade  This was carried around a circumferential fashion until the vaginal mucosa was completely incised in the specimen was freed.  The specimen was then delivered to the vagina.  A vaginal occlusive device was used to maintain the pneumoperitoneum  Instruments were changed with a needle driver and Kobra graspers.  Using a 9 inch V. lock suture, the cuff was closed by incorporating the anterior and posterior vaginal mucosa in each stitch. This was carried across all the way to the left corner and a running fashion. Two stitches were brought back towards the midline and the suture was cut flush with the vagina. The needle was brought out the pelvis. The pelvis was irrigated. All  pedicles were inspected. No bleeding was noted.  A small amount of bleeding was noted on the bladder.  This was cauterized superficially to obtain excellent hemostasis.  Ureters were noted in the pelvis to be peristalsing.  At this point the procedure was completed. The pressures were lowered to 80mmHg and the pedicles inspected to ensure there was no bleeding.  Then arista was placed along all pedicles.  At this point, the midline port was removed.  The airseal was stopped and Co2 gas removed from the pelvis/abdomen.  The patient was taken out of Trendelenburg positioning.  Several deep breaths were given to the patient's trying to any gas the abdomen and finally the remaining ports were removed.    The skin was then closed with subcuticular stitches of 3-0 Vicryl. The skin was cleansed Dermabond was applied. Attention was then turned the vagina and the cuff was inspected. No bleeding was noted. The anterior posterior vaginal mucosa was incorporated in each stitch. The Foley catheter was removed.  Cystoscopy was performed.  Entire bladder was visualized.  No sutures or bladder injuries were noted.  Ureters were noted with normal urine jets from each  one was seen.  Foley was left out after the cystoscopic fluid was drained and cystoscope removed.  There was a small vaginal laceration that was repaired with 3.0 Vicryl and a figure of eight suture prior to entire procedure being completed.   Sponge, lap, needle, initially counts were correct x2. Patient tolerated the procedure very well. She was awakened from anesthesia, extubated and taken to recovery in stable condition.   COUNTS:  YES  PLAN OF CARE: Transfer to PACU

## 2018-09-01 NOTE — Anesthesia Procedure Notes (Signed)
Procedure Name: Intubation Date/Time: 09/01/2018 7:38 AM Performed by: Justice Rocher, CRNA Pre-anesthesia Checklist: Patient identified, Emergency Drugs available, Suction available and Patient being monitored Patient Re-evaluated:Patient Re-evaluated prior to induction Oxygen Delivery Method: Circle system utilized Preoxygenation: Pre-oxygenation with 100% oxygen Induction Type: IV induction Ventilation: Mask ventilation without difficulty Laryngoscope Size: Mac and 3 Grade View: Grade I Tube type: Oral Tube size: 6.5 mm Number of attempts: 1 Airway Equipment and Method: Stylet and Oral airway Placement Confirmation: ETT inserted through vocal cords under direct vision,  positive ETCO2 and breath sounds checked- equal and bilateral Secured at: 22 cm Tube secured with: Tape Dental Injury: Teeth and Oropharynx as per pre-operative assessment

## 2018-09-01 NOTE — Progress Notes (Signed)
Day of Surgery Procedure(s) (LRB): TOTAL LAPAROSCOPIC HYSTERECTOMY WITH SALPINGECTOMY  BSO, REPAIR OF PERINEAL LACERATION (Bilateral) CYSTOSCOPY (N/A)  Subjective: Was called to see pt after she had a vagal response after standing to go to the bathroom.  Rapid response was called.  Pt back to bed safely.  She did have positive orthostatics.  Had received phenergan 6.25mg  IV about 10 minutes prior to voiding.  Upon entry into room, pt communicating and oriented  BP:  108/76  P: 78.  Objective: I have reviewed patient's vital signs and intake and output.  General: alert and cooperative Resp: clear to auscultation bilaterally Cardio: regular rate and rhythm, S1, S2 normal, no murmur, click, rub or gallop GI: occ BS, soft, non distended Extremities: extremities normal, atraumatic, no cyanosis or edema Vaginal Bleeding: scant VB Inc:  C/D/I  Assessment: s/p Procedure(s) with comments: TOTAL LAPAROSCOPIC HYSTERECTOMY WITH SALPINGECTOMY  BSO, REPAIR OF PERINEAL LACERATION (Bilateral) - possible BSO CYSTOSCOPY (N/A): stable but due to discomfort of nursing staff and vagal response with voiding, will transfer to telementry unit to ensure there is no contributing cardiac issue contributing to vagal response  Plan: bolus 1 L LR at this time  Stat CBC as well Transfer orders placed.     LOS: 0 days    Megan Salon 09/01/2018, 1:08 PM

## 2018-09-01 NOTE — Progress Notes (Signed)
Report called and given to [place], RN on [address] [employer] RN

## 2018-09-01 NOTE — Progress Notes (Signed)
Day of Surgery Procedure(s) (LRB): TOTAL LAPAROSCOPIC HYSTERECTOMY WITH SALPINGECTOMY  BSO, REPAIR OF PERINEAL LACERATION (Bilateral) CYSTOSCOPY (N/A)  Subjective: Patient reports she is feeling better.  Has been able to sit on the side of bed and void x 1.  Ordered dinner.  Has passed gas.  Has taken one fioricet and one vicodin in addition to Toradol.  No nausea.  No emesis.  Objective: I have reviewed patient's vital signs, intake and output, medications and labs. Hb:  11.7  General: alert and cooperative Resp: clear to auscultation bilaterally Cardio: regular rate and rhythm, S1, S2 normal, no murmur, click, rub or gallop GI: soft, non-tender; bowel sounds normal; no masses,  no organomegaly and incision: clean, dry and intact Extremities: extremities normal, atraumatic, no cyanosis or edema Vaginal Bleeding: minimal spotting  Assessment: s/p Procedure(s) with comments: TOTAL LAPAROSCOPIC HYSTERECTOMY WITH SALPINGECTOMY  BSO, REPAIR OF PERINEAL LACERATION (Bilateral) - possible BSO CYSTOSCOPY (N/A): stable still with some dizziness with standing  Plan: Continue IV fluids Pt going to eat dinner, then try to void.  If unsuccessful, repeat fluid bolus with 500cc LR Continue Toradol CBC and BMP in AM   LOS: 0 days    Megan Salon 09/01/2018, 5:51 PM

## 2018-09-01 NOTE — Progress Notes (Signed)
Attempted to call and page Dr. Sabra Heck at 11:11 and 11:30 for order for antiemetic d/t patient's c/o severe nausea. Awaiting response. Lyndel Pleasure RN

## 2018-09-02 ENCOUNTER — Encounter (HOSPITAL_BASED_OUTPATIENT_CLINIC_OR_DEPARTMENT_OTHER): Payer: Self-pay | Admitting: Obstetrics & Gynecology

## 2018-09-02 DIAGNOSIS — N72 Inflammatory disease of cervix uteri: Secondary | ICD-10-CM | POA: Diagnosis not present

## 2018-09-02 LAB — HEMOGLOBIN AND HEMATOCRIT, BLOOD
HCT: 32.1 % — ABNORMAL LOW (ref 36.0–46.0)
Hemoglobin: 10 g/dL — ABNORMAL LOW (ref 12.0–15.0)

## 2018-09-02 LAB — BASIC METABOLIC PANEL
Anion gap: 4 — ABNORMAL LOW (ref 5–15)
BUN: 12 mg/dL (ref 6–20)
CO2: 25 mmol/L (ref 22–32)
Calcium: 8.1 mg/dL — ABNORMAL LOW (ref 8.9–10.3)
Chloride: 113 mmol/L — ABNORMAL HIGH (ref 98–111)
Creatinine, Ser: 0.59 mg/dL (ref 0.44–1.00)
GFR calc Af Amer: >60 mL/min (ref >60)
GFR calc non Af Amer: >60 mL/min (ref >60)
Glucose, Bld: 112 mg/dL — ABNORMAL HIGH (ref 70–99)
Potassium: 3.4 mmol/L — ABNORMAL LOW (ref 3.5–5.1)
Sodium: 142 mmol/L (ref 135–145)

## 2018-09-02 LAB — CBC
HCT: 30.8 % — ABNORMAL LOW (ref 36.0–46.0)
Hemoglobin: 9.7 g/dL — ABNORMAL LOW (ref 12.0–15.0)
MCH: 32.9 pg (ref 26.0–34.0)
MCHC: 31.5 g/dL (ref 30.0–36.0)
MCV: 104.4 fL — ABNORMAL HIGH (ref 80.0–100.0)
Platelets: 136 10*3/uL — ABNORMAL LOW (ref 150–400)
RBC: 2.95 MIL/uL — ABNORMAL LOW (ref 3.87–5.11)
RDW: 12.9 % (ref 11.5–15.5)
WBC: 7.1 10*3/uL (ref 4.0–10.5)
nRBC: 0 % (ref 0.0–0.2)

## 2018-09-02 MED ORDER — HYDROCODONE-ACETAMINOPHEN 5-325 MG PO TABS: 1.0000 | ORAL_TABLET | ORAL | 0 refills | Status: DC | PRN

## 2018-09-02 MED ORDER — IBUPROFEN 200 MG PO TABS: 400.0000 mg | ORAL_TABLET | ORAL | Status: DC | PRN

## 2018-09-02 NOTE — Telephone Encounter (Signed)
Refill called to the pharmacy 

## 2018-09-02 NOTE — Discharge Instructions (Signed)
Post-surgical Instructions, Outpatient Surgery  You may expect to feel dizzy, weak, and drowsy for as long as 24 hours after receiving the medicine that made you sleep (anesthetic). For the first 24 hours after your surgery:    Do not drive a car, ride a bicycle, participate in physical activities, or take public transportation until you are done taking narcotic pain medicines or as directed by Dr. Sabra Heck.   Do not drink alcohol or take tranquilizers.   Do not take medicine that has not been prescribed by your physicians.   Do not sign important papers or make important decisions while on narcotic pain medicines.   Have a responsible person with you.   CARE OF INCISION  If you have a bandage, you may remove it in one day.  If there are steri-strips or dermabond, just let this loosen on its own.   You may shower on the first day after your surgery.  Do not sit in a tub bath for one week.  Avoid heavy lifting (more than 10 pounds/4.5 kilograms), pushing, or pulling.   Avoid activities that may risk injury to your incisions.   PAIN MANAGEMENT  Motrin 800mg .  (This is the same as 4-200mg  over the counter tablets of Motrin or ibuprofen.)  You may take this every eight hours or as needed for cramping.  It is also ok to just restart your Meloxicam.  Don't take this with motrin.  Vicodin 5/325mg .  For more severe pain, take one or two tablets every four to six hours as needed for pain control.  (Remember that narcotic pain medications increase your risk of constipation.  If this becomes a problem, you may take an over the counter stool softener like Colace 100mg  up to four times a day.)  DO'S AND DON'T'S  Do not take a tub bath for one week.  You may shower on the first day after your surgery  Do not do any heavy lifting for one to two weeks.  This increases the chance of bleeding.  Do move around as you feel able.  Stairs are fine.  You may begin to exercise again as you feel able.  Do  not lift any weights for two weeks.  Do not put anything in the vagina for two weeks--no tampons, intercourse, or douching.    REGULAR MEDIATIONS/VITAMINS:  You may restart all of your regular medications as prescribed.  You may restart all of your vitamins as you normally take them.    PLEASE CALL OR SEEK MEDICAL CARE IF:  You have persistent nausea and vomiting.   You have trouble eating or drinking.   You have an oral temperature above 100.5.   You have constipation that is not helped by adjusting diet or increasing fluid intake. Pain medicines are a common cause of constipation.   You have heavy vaginal bleeding  You have redness or drainage from your incision(s) or there is increasing pain or tenderness near or in the surgical site.

## 2018-09-02 NOTE — Telephone Encounter (Signed)
Call in #120 with 5 rf 

## 2018-09-02 NOTE — Progress Notes (Signed)
1 Day Post-Op Procedure(s) (LRB): TOTAL LAPAROSCOPIC HYSTERECTOMY WITH SALPINGECTOMY  BSO, REPAIR OF PERINEAL LACERATION (Bilateral) CYSTOSCOPY (N/A)  Subjective: Patient reports dizziness feels completely resolved.  Has been up to bedside commode several times without any light headed feeling.  Voiding well.  Pain under good control.  Has passed flatus several times.  Ate dinner last night.      Objective: I have reviewed patient's vital signs, intake and output, medications and labs. Post op hb: 9.7  Vitals:   09/01/18 2355 09/02/18 0403  BP: 100/62 (!) 90/57  Pulse: 76 77  Resp:  15  Temp: 98.6 F (37 C) 98.6 F (37 C)  SpO2: 96% 93%    General: alert and cooperative Resp: clear to auscultation bilaterally Cardio: regular rate and rhythm, S1, S2 normal, no murmur, click, rub or gallop GI: soft, non-tender; bowel sounds normal; no masses,  no organomegaly and incision: clean, dry, intact and with bruising Extremities: extremities normal, atraumatic, no cyanosis or edema Vaginal Bleeding: minimal  Assessment: s/p Procedure(s) with comments: TOTAL LAPAROSCOPIC HYSTERECTOMY WITH SALPINGECTOMY  BSO, REPAIR OF PERINEAL LACERATION (Bilateral) - possible BSO CYSTOSCOPY (N/A): stable and progressing well  Plan: Encourage ambulation repeat Hb in six hours.  plan d/c if stable.  LOS: 0 days    Megan Salon 09/02/2018, 7:21 AM

## 2018-09-08 ENCOUNTER — Ambulatory Visit (INDEPENDENT_AMBULATORY_CARE_PROVIDER_SITE_OTHER): Payer: 59 | Admitting: Obstetrics & Gynecology

## 2018-09-08 ENCOUNTER — Telehealth: Payer: Self-pay | Admitting: Pulmonary Disease

## 2018-09-08 VITALS — BP 116/68 | HR 86 | Resp 14 | Ht 65.0 in | Wt 104.8 lb

## 2018-09-08 DIAGNOSIS — R42 Dizziness and giddiness: Secondary | ICD-10-CM | POA: Diagnosis not present

## 2018-09-08 DIAGNOSIS — Z9889 Other specified postprocedural states: Secondary | ICD-10-CM

## 2018-09-08 MED ORDER — UMECLIDINIUM-VILANTEROL 62.5-25 MCG/INH IN AEPB: 1.0000 | INHALATION_SPRAY | Freq: Every day | RESPIRATORY_TRACT | 2 refills | Status: DC

## 2018-09-08 NOTE — Telephone Encounter (Signed)
Called and spoke with Patient.  She stated that she was started on Anoro inhaler at last OV.  She stated that she felt that it was working well for her and requested a prescription to be sent to [place].  Prescription sent to preferred pharmacy.  Nothing further at this time.  Per Dr. Vaughan Browner 08/15/18 OV-  Instructions   Start you on a inhaler called Anoro

## 2018-09-08 NOTE — Progress Notes (Signed)
Post Operative Visit  Procedure: TLH/BSO/cystoscopy Days Post-op: 7  Subjective: Doing fairly well.  Denies vaginal discharge.  Using minimal pain medication.  Took motrin yesterday.  Realized after getting home that she had a migraine but was able to use Zomig and this fixed it.  This also helped the neck and shoulder tightness resolve.  Still having some lightheaded feelings when stands up quickly or for long periods of time.  Hasn't passed out.  Needs to just stand still for a few minutes and then it passes. Voiding normally.  Having regular bowel movements.  Has vaginal stitch she would like removed today as is bothersome.  Objective: BP 116/68   Pulse 86   Resp 14   Ht 5\' 5"  (1.651 m)   Wt 104 lb 12.8 oz (47.5 kg)   LMP 09/03/2000 (Approximate)   BMI 17.44 kg/m   EXAM General: alert, cooperative and no distress Resp: clear to auscultation bilaterally Cardio: regular rate and rhythm, S1, S2 normal, no murmur, click, rub or gallop GI: soft, non-tender; bowel sounds normal; no masses,  no organomegaly and incision: clean, dry and intact.  There is extensive bruising from the right and mid incisions and down towards her inguinal region and hip, bruising is in healing stages as there is some green coloration to the bruising Extremities: extremities normal, atraumatic, no cyanosis or edema Vaginal Bleeding: none  Gyn:  NAEFG, vaginal stitch removed, cuff intact and healing well, no vaginal bleeding, no masses/fullness  Assessment: S/p TLH/BSO/cystoscopy Some lightheaded sensation Extensive bruising  Plan: CBC and CMP obtained today Will await results before deciding if any additional testing/treatment is needed.

## 2018-09-09 ENCOUNTER — Encounter: Payer: Self-pay | Admitting: Obstetrics & Gynecology

## 2018-09-09 LAB — CBC
Hematocrit: 36.9 % (ref 34.0–46.6)
Hemoglobin: 12.5 g/dL (ref 11.1–15.9)
MCH: 31.8 pg (ref 26.6–33.0)
MCHC: 33.9 g/dL (ref 31.5–35.7)
MCV: 94 fL (ref 79–97)
Platelets: 226 10*3/uL (ref 150–450)
RBC: 3.93 x10E6/uL (ref 3.77–5.28)
RDW: 12.2 % (ref 11.7–15.4)
WBC: 4.7 10*3/uL (ref 3.4–10.8)

## 2018-09-09 LAB — COMPREHENSIVE METABOLIC PANEL
ALT: 13 IU/L (ref 0–32)
AST: 25 IU/L (ref 0–40)
Albumin/Globulin Ratio: 2 (ref 1.2–2.2)
Albumin: 4.3 g/dL (ref 3.6–4.8)
Alkaline Phosphatase: 73 IU/L (ref 39–117)
BUN/Creatinine Ratio: 32 — ABNORMAL HIGH (ref 12–28)
BUN: 17 mg/dL (ref 8–27)
Bilirubin Total: 0.4 mg/dL (ref 0.0–1.2)
CO2: 24 mmol/L (ref 20–29)
Calcium: 9.3 mg/dL (ref 8.7–10.3)
Chloride: 103 mmol/L (ref 96–106)
Creatinine, Ser: 0.53 mg/dL — ABNORMAL LOW (ref 0.57–1.00)
GFR calc Af Amer: 119 mL/min/{1.73_m2} (ref >59)
GFR calc non Af Amer: 104 mL/min/{1.73_m2} (ref >59)
Globulin, Total: 2.2 g/dL (ref 1.5–4.5)
Glucose: 89 mg/dL (ref 65–99)
Potassium: 4.3 mmol/L (ref 3.5–5.2)
Sodium: 140 mmol/L (ref 134–144)
Total Protein: 6.5 g/dL (ref 6.0–8.5)

## 2018-09-12 ENCOUNTER — Telehealth: Payer: Self-pay | Admitting: *Deleted

## 2018-09-12 NOTE — Telephone Encounter (Signed)
Patient left a message to reschedule her appointment that had to be moved from 10/10/18  Attempted to contact the patient and left message for patient to call the office.

## 2018-09-16 ENCOUNTER — Ambulatory Visit (INDEPENDENT_AMBULATORY_CARE_PROVIDER_SITE_OTHER): Payer: 59 | Admitting: Obstetrics & Gynecology

## 2018-09-16 ENCOUNTER — Other Ambulatory Visit: Payer: Self-pay

## 2018-09-16 VITALS — BP 100/60 | HR 72 | Resp 16 | Ht 65.0 in | Wt 105.0 lb

## 2018-09-16 DIAGNOSIS — Z9889 Other specified postprocedural states: Secondary | ICD-10-CM

## 2018-09-16 NOTE — Progress Notes (Signed)
Post Operative Visit  Procedure: Total Laparoscopic Hysterectomy with Salpingectomy BSO, Repair of Perineal Laceration, Cystoscopy.  Days Post-op: 15  Subjective: Reports she is feeling much, much better.  Lightheaded sensation has fully resolved.  Felt really good yesterday so reorganized her kitchen.  Denies vaginal bleeding.  Having a little yellowish discharge that she'd like me to evaluate.  Voiding normally.  Bowel movements are normal.  Definitively having fatigue.  Considering return to work after next Friday.  Wants to know my opinion.    Objective: BP 100/60 (BP Location: Right Arm, Patient Position: Sitting, Cuff Size: Normal)   Pulse 72   Resp 16   Ht 5\' 5"  (1.651 m)   Wt 105 lb (47.6 kg)   LMP 09/03/2000 (Approximate)   BMI 17.47 kg/m   EXAM General: alert and no distress Resp: clear to auscultation bilaterally Cardio: regular rate and rhythm, S1, S2 normal, no murmur, click, rub or gallop GI: soft, non-tender; bowel sounds normal; no masses,  no organomegaly and incision: clean, dry, intact and bruising is much improved Extremities: extremities normal, atraumatic, no cyanosis or edema Vaginal Bleeding: none  Gyn:  NAEFG, vaginal without lesions, blood.  Scant yellowish d/w without any odor noted.  This is consistent with post op changes.  Cuff non tender without any fullness or masses.  Assessment: S/p TLH/BSO, cystoscopy due to recurrent PMP bleeding  Pathology with adenomyosis  Plan: Recheck the end of next week to decide if return to work is appropriate.

## 2018-09-18 ENCOUNTER — Encounter: Payer: Self-pay | Admitting: Obstetrics & Gynecology

## 2018-09-24 ENCOUNTER — Telehealth: Payer: Self-pay | Admitting: Obstetrics & Gynecology

## 2018-09-24 ENCOUNTER — Encounter: Payer: Self-pay | Admitting: Emergency Medicine

## 2018-09-24 NOTE — Telephone Encounter (Signed)
Dr. Sabra Heck,  This patient had a post op visit 09/16/2018. Wanted to return to work, your note says recheck in one week.  Does she need an office visit or can have note to return to work per her request for light duty (office?) next week and then full duty the following week.

## 2018-09-24 NOTE — Telephone Encounter (Signed)
I think note can be given.  I would like to see her four weeks post op and not six. Can you adjust this appt.  Also, she has an AEX for 12/2018 that can be cancelled.

## 2018-09-24 NOTE — Telephone Encounter (Signed)
Patient calling to let Dr. Sabra Heck know she is ready to go back to work next week - office duty only. Patient will call back to let us know who the note should be written out to.

## 2018-09-24 NOTE — Telephone Encounter (Signed)
Letter written and for patient to pick up at front desk.  Rescheduled 6 week post op to 4 week post op.  Cancelled annual exam in April and rescheduled for one year 09/2019.  Encounter closed.

## 2018-09-24 NOTE — Telephone Encounter (Signed)
Patient calling back; letter should be written out to Attn: Pati Gallo at [facility] Department. Office duty only next week, regular duty the following week. Patient requests a call when ready.

## 2018-09-30 ENCOUNTER — Ambulatory Visit: Payer: Self-pay | Admitting: Obstetrics & Gynecology

## 2018-09-30 NOTE — Progress Notes (Deleted)
Post Operative Visit  Procedure: Total Laparoscopic Hysterectomy with Salpingectomy BSO, Repair of Perineal Laceration. Cystoscopy.  Days Post-op: 29  Subjective: ***  Objective: LMP 09/03/2000 (Approximate)   EXAM General: {Exam; general:16600} Resp: {Exam; lung:16931} Cardio: {Exam; heart:5510} GI: {Exam, TH:9957900} Extremities: {Exam; extremity:5109} Vaginal Bleeding: {exam; vaginal bleeding:3041122}  Assessment: s/p ***  Plan: Recheck {NUMBER 1-10:22536} weeks ***

## 2018-10-03 ENCOUNTER — Ambulatory Visit (INDEPENDENT_AMBULATORY_CARE_PROVIDER_SITE_OTHER): Payer: 59 | Admitting: Obstetrics & Gynecology

## 2018-10-03 ENCOUNTER — Encounter: Payer: Self-pay | Admitting: Obstetrics & Gynecology

## 2018-10-03 ENCOUNTER — Other Ambulatory Visit: Payer: Self-pay

## 2018-10-03 VITALS — BP 92/60 | HR 72 | Resp 16 | Ht 65.0 in | Wt 103.4 lb

## 2018-10-03 DIAGNOSIS — Z9889 Other specified postprocedural states: Secondary | ICD-10-CM

## 2018-10-03 NOTE — Progress Notes (Signed)
Post Operative Visit  Procedure: Total Laparoscopic Hysterectomy with Salpingectomy BSO, Repair of Perineal Laceration, Cystoscopy. Days Post-op: 32  Subjective: Doing well.  Had spotting for just one day last week.  It stopped on its own.  Bowel and bladder function is normal.  Started back to work this week.  Bruising has resolved.  Energy still feels low.  She did return to work this week.    Objective: BP 92/60 (BP Location: Right Arm, Patient Position: Sitting, Cuff Size: Normal)   Pulse 72   Resp 16   Ht 5\' 5"  (1.651 m)   Wt 103 lb 6.4 oz (46.9 kg)   LMP 09/03/2000 (Approximate)   BMI 17.21 kg/m   EXAM General: alert and no distress GI: soft, non-tender; bowel sounds normal; no masses,  no organomegaly and incision: clean, dry and intact Extremities: extremities normal, atraumatic, no cyanosis or edema Vaginal Bleeding: none  Gyn:  NAEFG, cuff intact, no blood or discharge, no fullness or masses, non tender  Assessment: s/p TLH/BSO, cystoscopy Single suture noted at mid portion of vaginal cuff  Plan: Recheck 6 weeks.  Advised to not be SA until that time.

## 2018-10-10 ENCOUNTER — Ambulatory Visit: Payer: 59 | Admitting: Physician Assistant

## 2018-10-10 ENCOUNTER — Ambulatory Visit: Payer: 59 | Admitting: Obstetrics & Gynecology

## 2018-10-10 NOTE — Progress Notes (Deleted)
Office Visit Note  Patient: Emily Ward             Date of Birth: 05-13-1956           MRN: [id number]             PCP: Laurey Morale, MD Referring: Laurey Morale, MD Visit Date: 10/24/2018 Occupation: '@GUAROCC' @  Subjective:  No chief complaint on file.   History of Present Illness: Emily Ward is a 63 y.o. female ***   Activities of Daily Living:  Patient reports morning stiffness for *** {minute/hour:19697}.   Patient {ACTIONS;DENIES/REPORTS:21021675::"Denies"} nocturnal pain.  Difficulty dressing/grooming: {ACTIONS;DENIES/REPORTS:21021675::"Denies"} Difficulty climbing stairs: {ACTIONS;DENIES/REPORTS:21021675::"Denies"} Difficulty getting out of chair: {ACTIONS;DENIES/REPORTS:21021675::"Denies"} Difficulty using hands for taps, buttons, cutlery, and/or writing: {ACTIONS;DENIES/REPORTS:21021675::"Denies"}  No Rheumatology ROS completed.   PMFS History:  Patient Active Problem List   Diagnosis Date Noted  . Postmenopausal bleeding 09/01/2018  . MGUS (monoclonal gammopathy of unknown significance) 02/13/2018  . COPD with emphysema ([place]) 10/04/2017  . Palpitations 07/30/2017  . Shortness of breath 07/30/2017  . Hormone replacement therapy (HRT) 03/30/2017  . Former smoker 03/30/2017  . Other fatigue 06/30/2016  . Insomnia 06/30/2016  . DJD (degenerative joint disease), cervical 06/30/2016  . 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 IBS (irritable bowel syndrome) 06/30/2016  . Interstitial cystitis 06/30/2016  . SHOULDER PAIN, RIGHT 09/05/2010  . GERD 03/24/2009  . SACROILIAC STRAIN 03/24/2009  . Atypical chest pain 01/20/2008  . HEMATURIA UNSPECIFIED 10/14/2007  . FIBROMYALGIA 07/03/2007  . HYPERLIPIDEMIA NEC/NOS 05/16/2007  . HEADACHE 05/09/2007    Past Medical History:  Diagnosis Date  . Cervical disc disease 2006  . Emphysema lung ([place])   . Fetal twin to twin transfusion 1993   with second  pregnancy.  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    . Fibromyalgia    sees Dr. Estanislado Pandy   . Heart murmur   . 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 Hyperlipidemia   . Migraines   . MVP (mitral valve prolapse)    no daily medication  . Peptic ulcer    no problems    Family History  Problem Relation Age of Onset  . Breast cancer Mother        lumpectomy in late 50/60  . Heart attack Father   . Congestive Heart Failure Father   . Cancer Other        breast/fhx  . Heart disease Other        fhx  . Diabetes type II Sister        and ? brother  . Arthritis Son        psoriatic  . Colon cancer Neg Hx   . Esophageal cancer Neg Hx   . Rectal cancer Neg Hx   . Stomach cancer Neg Hx    Past Surgical History:  Procedure Laterality Date  . CERVICAL LAMINECTOMY  2006  . CESAREAN SECTION  1993  . CHOLECYSTECTOMY  06/2002  . CYSTOSCOPY N/A 09/01/2018   Procedure: CYSTOSCOPY;  Surgeon: Megan Salon, MD;  Location: [facility];  Service: Gynecology;  Laterality: N/A;  . DILATATION & CURETTAGE/HYSTEROSCOPY WITH MYOSURE N/A 04/12/2017   Procedure: DILATATION & CURETTAGE/HYSTEROSCOPY;  Surgeon: Megan Salon, MD;  Location: [facility];  Service: Gynecology;  Laterality: N/A;  . HYSTEROSCOPY     years ago with Dr Radene Knee-? 2000  . TOTAL LAPAROSCOPIC HYSTERECTOMY WITH SALPINGECTOMY Bilateral 09/01/2018   Procedure: TOTAL LAPAROSCOPIC  HYSTERECTOMY WITH SALPINGECTOMY  BSO, REPAIR OF PERINEAL LACERATION;  Surgeon: Megan Salon, MD;  Location: [facility];  Service: Gynecology;  Laterality: Bilateral;  possible BSO  . URETHRAL DILATION     Social History   Social History Narrative  . Not on file   Immunization History  Administered Date(s) Administered  . Influenza Whole 06/30/2007, 05/04/2010  . Influenza,inj,Quad PF,6+ Mos 07/12/2017, 05/23/2018  . Influenza-Unspecified 06/02/2014, 06/03/2016  . MMR 06/30/2007  . Td 09/03/1998, 05/05/2009      Objective: Vital Signs: LMP 09/03/2000 (Approximate)    Physical Exam   Musculoskeletal Exam: ***  CDAI Exam: CDAI Score: Not documented Patient Global Assessment: Not documented; Provider Global Assessment: Not documented Swollen: Not documented; Tender: Not documented Joint Exam   Not documented   There is currently no information documented on the homunculus. Go to the Rheumatology activity and complete the homunculus joint exam.  Investigation: No additional findings.  Imaging: No results found.  Recent Labs: Lab Results  Component Value Date   WBC 4.7 09/08/2018   HGB 12.5 09/08/2018   PLT 226 09/08/2018   NA 140 09/08/2018   K 4.3 09/08/2018   CL 103 09/08/2018   CO2 24 09/08/2018   GLUCOSE 89 09/08/2018   BUN 17 09/08/2018   CREATININE 0.53 (L) 09/08/2018   BILITOT 0.4 09/08/2018   ALKPHOS 73 09/08/2018   AST 25 09/08/2018   ALT 13 09/08/2018   PROT 6.5 09/08/2018   ALBUMIN 4.3 09/08/2018   CALCIUM 9.3 09/08/2018   GFRAA 119 09/08/2018    Speciality Comments: No specialty comments available.  Procedures:  No procedures performed Allergies: Depakote [divalproex sodium] and Nsaids   Assessment / Plan:     Visit Diagnoses: No diagnosis found.   Orders: No orders of the defined types were placed in this encounter.  No orders of the defined types were placed in this encounter.   Face-to-face time spent with patient was *** minutes. Greater than 50% of time was spent in counseling and coordination of care.  Follow-Up Instructions: No follow-ups on file.   Earnestine Mealing, CMA  Note - This record has been created using Editor, commissioning.  Chart creation errors have been sought, but may not always  have been located. Such creation errors do not reflect on  the standard of medical care.

## 2018-10-15 DIAGNOSIS — M810 Age-related osteoporosis without current pathological fracture: Secondary | ICD-10-CM | POA: Diagnosis not present

## 2018-10-15 DIAGNOSIS — E213 Hyperparathyroidism, unspecified: Secondary | ICD-10-CM | POA: Diagnosis not present

## 2018-10-17 ENCOUNTER — Ambulatory Visit: Payer: 59 | Admitting: Obstetrics & Gynecology

## 2018-10-22 ENCOUNTER — Telehealth: Payer: Self-pay

## 2018-10-22 ENCOUNTER — Encounter (HOSPITAL_COMMUNITY): Payer: Self-pay

## 2018-10-22 ENCOUNTER — Emergency Department (HOSPITAL_COMMUNITY)
Admission: EM | Admit: 2018-10-22 | Discharge: 2018-10-22 | Disposition: A | Discharge status: Home / Self Care | Payer: 59 | Attending: Emergency Medicine | Admitting: Emergency Medicine

## 2018-10-22 ENCOUNTER — Emergency Department (HOSPITAL_COMMUNITY): Payer: 59

## 2018-10-22 DIAGNOSIS — S0083XA Contusion of other part of head, initial encounter: Secondary | ICD-10-CM | POA: Diagnosis not present

## 2018-10-22 DIAGNOSIS — S199XXA Unspecified injury of neck, initial encounter: Secondary | ICD-10-CM | POA: Diagnosis not present

## 2018-10-22 DIAGNOSIS — Y939 Activity, unspecified: Secondary | ICD-10-CM | POA: Insufficient documentation

## 2018-10-22 DIAGNOSIS — Z79899 Other long term (current) drug therapy: Secondary | ICD-10-CM | POA: Insufficient documentation

## 2018-10-22 DIAGNOSIS — R51 Headache: Secondary | ICD-10-CM | POA: Diagnosis present

## 2018-10-22 DIAGNOSIS — Z23 Encounter for immunization: Secondary | ICD-10-CM | POA: Diagnosis not present

## 2018-10-22 DIAGNOSIS — M542 Cervicalgia: Secondary | ICD-10-CM | POA: Insufficient documentation

## 2018-10-22 DIAGNOSIS — S0990XA Unspecified injury of head, initial encounter: Secondary | ICD-10-CM | POA: Insufficient documentation

## 2018-10-22 DIAGNOSIS — Y999 Unspecified external cause status: Secondary | ICD-10-CM | POA: Diagnosis not present

## 2018-10-22 DIAGNOSIS — Y9289 Other specified places as the place of occurrence of the external cause: Secondary | ICD-10-CM | POA: Insufficient documentation

## 2018-10-22 DIAGNOSIS — K0889 Other specified disorders of teeth and supporting structures: Secondary | ICD-10-CM | POA: Insufficient documentation

## 2018-10-22 DIAGNOSIS — Z87891 Personal history of nicotine dependence: Secondary | ICD-10-CM | POA: Diagnosis not present

## 2018-10-22 DIAGNOSIS — J449 Chronic obstructive pulmonary disease, unspecified: Secondary | ICD-10-CM | POA: Diagnosis not present

## 2018-10-22 DIAGNOSIS — W19XXXA Unspecified fall, initial encounter: Secondary | ICD-10-CM

## 2018-10-22 DIAGNOSIS — S0093XA Contusion of unspecified part of head, initial encounter: Secondary | ICD-10-CM | POA: Diagnosis not present

## 2018-10-22 DIAGNOSIS — W0110XA Fall on same level from slipping, tripping and stumbling with subsequent striking against unspecified object, initial encounter: Secondary | ICD-10-CM | POA: Insufficient documentation

## 2018-10-22 LAB — CBC WITH DIFFERENTIAL/PLATELET
Abs Immature Granulocytes: 0.02 10*3/uL (ref 0.00–0.07)
Basophils Absolute: 0 10*3/uL (ref 0.0–0.1)
Basophils Relative: 1 %
Eosinophils Absolute: 0.1 10*3/uL (ref 0.0–0.5)
Eosinophils Relative: 1 %
HCT: 38.8 % (ref 36.0–46.0)
Hemoglobin: 12.4 g/dL (ref 12.0–15.0)
Immature Granulocytes: 0 %
Lymphocytes Relative: 16 %
Lymphs Abs: 1 10*3/uL (ref 0.7–4.0)
MCH: 32.1 pg (ref 26.0–34.0)
MCHC: 32 g/dL (ref 30.0–36.0)
MCV: 100.5 fL — ABNORMAL HIGH (ref 80.0–100.0)
Monocytes Absolute: 0.3 10*3/uL (ref 0.1–1.0)
Monocytes Relative: 5 %
Neutro Abs: 5.1 10*3/uL (ref 1.7–7.7)
Neutrophils Relative %: 77 %
Platelets: 141 10*3/uL — ABNORMAL LOW (ref 150–400)
RBC: 3.86 MIL/uL — ABNORMAL LOW (ref 3.87–5.11)
RDW: 12.5 % (ref 11.5–15.5)
WBC: 6.5 10*3/uL (ref 4.0–10.5)
nRBC: 0 % (ref 0.0–0.2)

## 2018-10-22 LAB — BASIC METABOLIC PANEL
Anion gap: 8 (ref 5–15)
BUN: 13 mg/dL (ref 6–20)
CO2: 23 mmol/L (ref 22–32)
Calcium: 8.9 mg/dL (ref 8.9–10.3)
Chloride: 110 mmol/L (ref 98–111)
Creatinine, Ser: 0.54 mg/dL (ref 0.44–1.00)
GFR calc Af Amer: >60 mL/min (ref >60)
GFR calc non Af Amer: >60 mL/min (ref >60)
Glucose, Bld: 97 mg/dL (ref 70–99)
Potassium: 3.9 mmol/L (ref 3.5–5.1)
Sodium: 141 mmol/L (ref 135–145)

## 2018-10-22 LAB — CBG MONITORING, ED: Glucose-Capillary: 85 mg/dL (ref 70–99)

## 2018-10-22 MED ORDER — SODIUM CHLORIDE 0.9 % IV BOLUS
500.0000 mL | Freq: Once | INTRAVENOUS | Status: AC
  Administered 2018-10-22: 500 mL via INTRAVENOUS

## 2018-10-22 MED ORDER — ACETAMINOPHEN 325 MG PO TABS
650.0000 mg | ORAL_TABLET | Freq: Once | ORAL | Status: AC
  Administered 2018-10-22: 650 mg via ORAL
  Filled 2018-10-22: qty 2

## 2018-10-22 MED ORDER — PROCHLORPERAZINE MALEATE 5 MG PO TABS
5.0000 mg | ORAL_TABLET | Freq: Once | ORAL | Status: AC
  Administered 2018-10-22: 5 mg via ORAL
  Filled 2018-10-22: qty 1

## 2018-10-22 MED ORDER — TETANUS-DIPHTH-ACELL PERTUSSIS 5-2.5-18.5 LF-MCG/0.5 IM SUSP
0.5000 mL | Freq: Once | INTRAMUSCULAR | Status: AC
  Administered 2018-10-22: 0.5 mL via INTRAMUSCULAR
  Filled 2018-10-22: qty 0.5

## 2018-10-22 MED ORDER — ONDANSETRON 4 MG PO TBDP
4.0000 mg | ORAL_TABLET | Freq: Once | ORAL | Status: AC
  Administered 2018-10-22: 4 mg via ORAL
  Filled 2018-10-22: qty 1

## 2018-10-22 NOTE — ED Provider Notes (Signed)
[facility] DEPT Provider Note   CSN: [id number] Arrival date & time: 10/22/18  0310    History   Chief Complaint Chief Complaint  Patient presents with  . Fall    HPI Emily Ward is a 63 y.o. female.     HPI  Pt is a 63 y/o female with a h/o emphysema, fibromyalgia, hld, mvp, peptic ulcer, who presents to the ED today for evaluation after a fall that occurred PTA. Pt states that she was walking in a parker lot PTA and tripped over a cement block in a parking lot. Pts husband was walking near her and corroborates the mechanism of the fall. She states she does not remember falling. Did not lose consciousness. States that after arriving home she starting feeling nauseated, having lip/dental pain, and a headache. States her left eye is very bruised and swollen. Denies vision changes. She reports lightheadedness and dizziness when standing. Is also c/o neck pain which she states is somewhat chronic for her. No numbness/weakness. Denies vomiting.  She is not on blood thinners.   Past Medical History:  Diagnosis Date  . Cervical disc disease 2006  . Emphysema lung ([place])   . 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    . Fibromyalgia    sees Dr. Estanislado Pandy   . Heart murmur   . 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 Hyperlipidemia   . Migraines   . MVP (mitral valve prolapse)    no daily medication  . Peptic ulcer    no problems    Patient Active Problem List   Diagnosis Date Noted  . Postmenopausal bleeding 09/01/2018  . MGUS (monoclonal gammopathy of unknown significance) 02/13/2018  . COPD with emphysema ([place]) 10/04/2017  . Palpitations 07/30/2017  . Shortness of breath 07/30/2017  . Hormone replacement therapy (HRT) 03/30/2017  . Former smoker 03/30/2017  . Other fatigue 06/30/2016  . Insomnia 06/30/2016  . DJD (degenerative joint disease), cervical 06/30/2016  . Spondylosis of  lumbar region without myelopathy or radiculopathy 06/30/2016  . IBS (irritable bowel syndrome) 06/30/2016  . Interstitial cystitis 06/30/2016  . SHOULDER PAIN, RIGHT 09/05/2010  . GERD 03/24/2009  . SACROILIAC STRAIN 03/24/2009  . Atypical chest pain 01/20/2008  . HEMATURIA UNSPECIFIED 10/14/2007  . FIBROMYALGIA 07/03/2007  . HYPERLIPIDEMIA NEC/NOS 05/16/2007  . HEADACHE 05/09/2007    Past Surgical History:  Procedure Laterality Date  . CERVICAL LAMINECTOMY  2006  . CESAREAN SECTION  1993  . CHOLECYSTECTOMY  06/2002  . CYSTOSCOPY N/A 09/01/2018   Procedure: CYSTOSCOPY;  Surgeon: Megan Salon, MD;  Location: [facility];  Service: Gynecology;  Laterality: N/A;  . DILATATION & CURETTAGE/HYSTEROSCOPY WITH MYOSURE N/A 04/12/2017   Procedure: DILATATION & CURETTAGE/HYSTEROSCOPY;  Surgeon: Megan Salon, MD;  Location: [facility];  Service: Gynecology;  Laterality: N/A;  . HYSTEROSCOPY     years ago with Dr Radene Knee-? 2000  . TOTAL LAPAROSCOPIC HYSTERECTOMY WITH SALPINGECTOMY Bilateral 09/01/2018   Procedure: TOTAL LAPAROSCOPIC HYSTERECTOMY WITH SALPINGECTOMY  BSO, REPAIR OF PERINEAL LACERATION;  Surgeon: Megan Salon, MD;  Location: [facility];  Service: Gynecology;  Laterality: Bilateral;  possible BSO  . URETHRAL DILATION       OB History    Gravida  2   Para  2   Term      Preterm      AB      Living  2     SAB      TAB      Ectopic      Multiple  1   Live Births           Obstetric Comments  One child given up for adoption Loss one twin         Home Medications    Prior to Admission medications   Medication Sig Start Date End Date Taking? Authorizing Provider  butalbital-acetaminophen-caffeine (FIORICET, ESGIC) 50-325-40 MG tablet TAKE 1 TABLET BY MOUTH EVERY 6 HOURS AS NEEDED 09/02/18  Yes Laurey Morale, MD  CALCIUM PO Take 1 tablet by mouth daily.   Yes [provider]  cetirizine (ZYRTEC)  10 MG tablet Take 10 mg by mouth daily as needed for allergies (spring time).    Yes [provider]  chlorzoxazone (PARAFON) 500 MG tablet TAKE 1 TABLET BY MOUTH FOUR TIMES DAILY AS NEEDED FOR MUSCLE SPASMS Patient taking differently: Take 500 mg by mouth 4 (four) times daily as needed for muscle spasms.  02/11/18  Yes Laurey Morale, MD  cholecalciferol (VITAMIN D) 1000 units tablet Take 1,000 Units by mouth daily.   Yes [provider]  Cyanocobalamin (VITAMIN B-12 PO) Take 1 tablet by mouth daily.    Yes [provider]  cyclobenzaprine (FLEXERIL) 10 MG tablet Take 1 tablet (10 mg total) by mouth at bedtime. for muscle spams Patient taking differently: Take 10 mg by mouth at bedtime as needed for muscle spasms. for muscle spams 07/02/16  Yes Panwala, Naitik, PA-C  denosumab (PROLIA) 60 MG/ML SOSY injection Inject 60 mg into the skin every 6 (six) months.   Yes [provider]  DULoxetine (CYMBALTA) 30 MG capsule TAKE 2 CAPSULES BY MOUTH EVERY MORNING Patient taking differently: Take 60 mg by mouth daily. Take 2 capsules by mouth every morning 08/13/18  Yes Deveshwar, Abel Presto, MD  estradiol (ESTRACE) 1 MG tablet TAKE 1 TABLET(1 MG) BY MOUTH DAILY Patient taking differently: Take 1 mg by mouth daily.  11/25/17  Yes Megan Salon, MD  meloxicam (MOBIC) 7.5 MG tablet TAKE 1 TABLET(7.5 MG) BY MOUTH DAILY Patient taking differently: Take 7.5 mg by mouth daily as needed for pain.  08/25/18  Yes Laurey Morale, MD  umeclidinium-vilanterol (ANORO ELLIPTA) 62.5-25 MCG/INH AEPB Inhale 1 puff into the lungs daily. 09/08/18  Yes Mannam, Praveen, MD  zolmitriptan (ZOMIG) 5 MG tablet TAKE 1 TABLET BY MOUTH AS NEEDED Patient taking differently: Take 5 mg by mouth as needed for migraine.  12/24/17  Yes Laurey Morale, MD  zolpidem (AMBIEN) 10 MG tablet Take 1 tablet (10 mg total) by mouth at bedtime. Patient taking differently: Take 10 mg by mouth at bedtime as needed for sleep.   08/26/18  Yes Laurey Morale, MD    Family History Family History  Problem Relation Age of Onset  . Breast cancer Mother        lumpectomy in late 50/60  . Heart attack Father   . Congestive Heart Failure Father   . Cancer Other        breast/fhx  . Heart disease Other        fhx  . Diabetes type II Sister        and ? brother  . Arthritis Son        psoriatic  . Colon cancer Neg Hx   . Esophageal cancer Neg Hx   . Rectal cancer Neg Hx   .  Stomach cancer Neg Hx     Social History Social History   Tobacco Use  . Smoking status: Former Smoker    Packs/day: 1.00    Years: 20.00    Pack years: 20.00    Types: Cigarettes    Last attempt to quit: 04/02/2013    Years since quitting: 5.5  . Smokeless tobacco: Never Used  Substance Use Topics  . Alcohol use: Yes    Alcohol/week: 1.0 standard drinks    Types: 1 Glasses of wine per week    Comment: occ  . Drug use: Never     Allergies   Depakote [divalproex sodium] and Nsaids   Review of Systems Review of Systems  Constitutional: Negative for fever.  HENT: Negative for sore throat.        Dental pain, lip laceration  Eyes: Negative for photophobia, pain and visual disturbance.  Respiratory: Negative for cough and shortness of breath.   Cardiovascular: Negative for chest pain.  Gastrointestinal: Positive for nausea. Negative for abdominal pain and vomiting.  Genitourinary: Negative for dysuria and hematuria.  Musculoskeletal: Positive for neck pain.  Skin: Negative for color change and rash.  Neurological: Positive for dizziness, light-headedness and headaches. Negative for weakness and numbness.  All other systems reviewed and are negative.    Physical Exam Updated Vital Signs BP 101/68 (BP Location: Right Arm)   Pulse 81   Temp 97.8 F (36.6 C) (Oral)   Resp 14   Ht 5\' 5"  (1.651 m)   Wt 46 kg   LMP 09/03/2000 (Approximate)   SpO2 97%   BMI 16.88 kg/m   Physical Exam Vitals signs and nursing note  reviewed.  Constitutional:      General: She is not in acute distress.    Appearance: She is well-developed.  HENT:     Head:     Comments: Ecchymosis to the left eye with soft tissue swelling around the eye. TTP to the superior and inferior aspect of the eye and nose without crepitus.     Right Ear: Tympanic membrane normal.     Left Ear: Tympanic membrane normal.     Ears:     Comments: No hemotympanum    Nose:     Comments: No nasal septal hematoma    Mouth/Throat:     Comments: Dried blood to lips. Laceration to the mucosa of the left lip that is not through and through. No fractured or loose teeth. Eyes:     Extraocular Movements: Extraocular movements intact.     Conjunctiva/sclera: Conjunctivae normal.     Pupils: Pupils are equal, round, and reactive to light.     Comments: No nystagmus. No entrapment of the left eye. Subconjunctival hemorrhage to the left eye.  Neck:     Musculoskeletal: Neck supple.  Cardiovascular:     Rate and Rhythm: Normal rate and regular rhythm.     Heart sounds: Normal heart sounds. No murmur.  Pulmonary:     Effort: Pulmonary effort is normal. No respiratory distress.     Breath sounds: Normal breath sounds. No stridor. No wheezing or rhonchi.  Abdominal:     Palpations: Abdomen is soft.     Tenderness: There is no abdominal tenderness.  Musculoskeletal:     Comments: Mild midline TTP with associated bilat cervical paraspinous TTP. No midline thoracic and lumbar TTP.   Skin:    General: Skin is warm and dry.  Neurological:     Mental Status: She is alert and oriented  to person, place, and time.     Comments: Mental Status:  Alert, thought content appropriate, able to give a coherent history. Speech fluent without evidence of aphasia. Able to follow 2 step commands without difficulty.  Cranial Nerves:  II:  Peripheral visual fields grossly normal, pupils equal, round, reactive to light III,IV, VI: extra-ocular motions intact bilaterally    V,VII: smile symmetric, facial light touch sensation equal VIII: hearing grossly normal to voice  X: uvula elevates symmetrically  XI: bilateral shoulder shrug symmetric and strong XII: midline tongue extension without fassiculations Motor:  Normal tone. 5/5 strength of BUE and BLE major muscle groups including strong and equal grip strength and dorsiflexion/plantar flexion Sensory: light touch normal in all extremities. Cerebellar: normal finger-to-nose with bilateral upper extremities Gait: not assessed CV: 2+ radial and DP pulses  Psychiatric:        Mood and Affect: Mood normal.    ED Treatments / Results  Labs (all labs ordered are listed, but only abnormal results are displayed) Labs Reviewed  CBC WITH DIFFERENTIAL/PLATELET - Abnormal; Notable for the following components:      Result Value   RBC 3.86 (*)    MCV 100.5 (*)    Platelets 141 (*)    All other components within normal limits  BASIC METABOLIC PANEL  CBG MONITORING, ED    EKG None  Radiology Ct Head Wo Contrast  Result Date: 10/22/2018 CLINICAL DATA:  Fall with bruising. EXAM: CT HEAD WITHOUT CONTRAST CT MAXILLOFACIAL WITHOUT CONTRAST CT CERVICAL SPINE WITHOUT CONTRAST TECHNIQUE: Multidetector CT imaging of the head, cervical spine, and maxillofacial structures were performed using the standard protocol without intravenous contrast. Multiplanar CT image reconstructions of the cervical spine and maxillofacial structures were also generated. COMPARISON:  Head CT 05/31/2005 FINDINGS: CT HEAD FINDINGS Brain: No evidence of acute infarction, hemorrhage, hydrocephalus, extra-axial collection or mass lesion/mass effect. Vascular: No hyperdense vessel or unexpected calcification. Skull: Normal. Negative for fracture or focal lesion. CT MAXILLOFACIAL FINDINGS Osseous: Negative for fracture or mandibular dislocation. Mild irregularity of the left nasal bone is attributed to motion artifact Orbits: No evidence of postseptal  injury. Evaluation is somewhat degraded by motion. Sinuses: Negative.  No hemosinus Soft tissues: Left cheek and periorbital contusion. CT CERVICAL SPINE FINDINGS Alignment: Reversal of cervical lordosis.  No traumatic malalignment Skull base and vertebrae: Negative for fracture. C5-6 ACDF with solid arthrodesis. Soft tissues and spinal canal: No prevertebral fluid or swelling. No visible canal hematoma. Disc levels: Lower cervical facet spurring. No evidence of cord impingement. Upper chest: Emphysema IMPRESSION: No evidence of acute intracranial or cervical spine injury. Left-sided facial contusion without fracture. Electronically Signed   By: Monte Fantasia M.D.   On: 10/22/2018 04:55   Ct Cervical Spine Wo Contrast  Result Date: 10/22/2018 CLINICAL DATA:  Fall with bruising. EXAM: CT HEAD WITHOUT CONTRAST CT MAXILLOFACIAL WITHOUT CONTRAST CT CERVICAL SPINE WITHOUT CONTRAST TECHNIQUE: Multidetector CT imaging of the head, cervical spine, and maxillofacial structures were performed using the standard protocol without intravenous contrast. Multiplanar CT image reconstructions of the cervical spine and maxillofacial structures were also generated. COMPARISON:  Head CT 05/31/2005 FINDINGS: CT HEAD FINDINGS Brain: No evidence of acute infarction, hemorrhage, hydrocephalus, extra-axial collection or mass lesion/mass effect. Vascular: No hyperdense vessel or unexpected calcification. Skull: Normal. Negative for fracture or focal lesion. CT MAXILLOFACIAL FINDINGS Osseous: Negative for fracture or mandibular dislocation. Mild irregularity of the left nasal bone is attributed to motion artifact Orbits: No evidence of postseptal injury. Evaluation is  somewhat degraded by motion. Sinuses: Negative.  No hemosinus Soft tissues: Left cheek and periorbital contusion. CT CERVICAL SPINE FINDINGS Alignment: Reversal of cervical lordosis.  No traumatic malalignment Skull base and vertebrae: Negative for fracture. C5-6 ACDF with  solid arthrodesis. Soft tissues and spinal canal: No prevertebral fluid or swelling. No visible canal hematoma. Disc levels: Lower cervical facet spurring. No evidence of cord impingement. Upper chest: Emphysema IMPRESSION: No evidence of acute intracranial or cervical spine injury. Left-sided facial contusion without fracture. Electronically Signed   By: Monte Fantasia M.D.   On: 10/22/2018 04:55   Ct Maxillofacial Wo Contrast  Result Date: 10/22/2018 CLINICAL DATA:  Fall with bruising. EXAM: CT HEAD WITHOUT CONTRAST CT MAXILLOFACIAL WITHOUT CONTRAST CT CERVICAL SPINE WITHOUT CONTRAST TECHNIQUE: Multidetector CT imaging of the head, cervical spine, and maxillofacial structures were performed using the standard protocol without intravenous contrast. Multiplanar CT image reconstructions of the cervical spine and maxillofacial structures were also generated. COMPARISON:  Head CT 05/31/2005 FINDINGS: CT HEAD FINDINGS Brain: No evidence of acute infarction, hemorrhage, hydrocephalus, extra-axial collection or mass lesion/mass effect. Vascular: No hyperdense vessel or unexpected calcification. Skull: Normal. Negative for fracture or focal lesion. CT MAXILLOFACIAL FINDINGS Osseous: Negative for fracture or mandibular dislocation. Mild irregularity of the left nasal bone is attributed to motion artifact Orbits: No evidence of postseptal injury. Evaluation is somewhat degraded by motion. Sinuses: Negative.  No hemosinus Soft tissues: Left cheek and periorbital contusion. CT CERVICAL SPINE FINDINGS Alignment: Reversal of cervical lordosis.  No traumatic malalignment Skull base and vertebrae: Negative for fracture. C5-6 ACDF with solid arthrodesis. Soft tissues and spinal canal: No prevertebral fluid or swelling. No visible canal hematoma. Disc levels: Lower cervical facet spurring. No evidence of cord impingement. Upper chest: Emphysema IMPRESSION: No evidence of acute intracranial or cervical spine injury. Left-sided  facial contusion without fracture. Electronically Signed   By: Monte Fantasia M.D.   On: 10/22/2018 04:55    Procedures Procedures (including critical care time)  Medications Ordered in ED Medications  Tdap (BOOSTRIX) injection 0.5 mL (0.5 mLs Intramuscular Given 10/22/18 0539)  acetaminophen (TYLENOL) tablet 650 mg (650 mg Oral Given 10/22/18 0538)  ondansetron (ZOFRAN-ODT) disintegrating tablet 4 mg (4 mg Oral Given 10/22/18 4270)  sodium chloride 0.9 % bolus 500 mL (500 mLs Intravenous New Bag/Given 10/22/18 0753)  prochlorperazine (COMPAZINE) tablet 5 mg (5 mg Oral Given 10/22/18 0806)     Initial Impression / Assessment and Plan / ED Course  I have reviewed the triage vital signs and the nursing notes.  Pertinent labs & imaging results that were available during my care of the patient were reviewed by me and considered in my medical decision making (see chart for details).        Final Clinical Impressions(s) / ED Diagnoses   Final diagnoses:  Fall, initial encounter  Injury of head, initial encounter   Pt with mechanical fall PTA sustaining head injury. Has amnesia to the event. No known loc. No blood thinners. Has been dizzy and nauseated since. VSS in the ED.   Neuro exam is nonfocal. She does have mild midline cervical spine ttp with associated muscle tenderness.   CT imaging of the brain, face, and cervical spine are negative for any acute abnormalities other than soft tissue swelling.   On reassessment after zofran, pt is still c/o severe nausea and states she feels like she is going to pass out. She has h/o vasovagal syncope and currently feels similar. Repeat BP at 70 systolic.  BP improved after several minutes and pt felt improved. She did not lose consciousness. Labs, fluids and compazine ordered.   Care signed out to Domenic Moras, Pac with plan to f/u on labs and reassess pt after IVF and compazine. Pt will need to be ambulated. If her symptoms do not improve she may  require admission for observation.   ED Discharge Orders    None       Bishop Dublin 10/22/18 5053    Fatima Blank, MD 10/23/18 [phone]

## 2018-10-22 NOTE — ED Triage Notes (Signed)
Pt fell around 10pm tonight in a parking lot. Pt does not recall falling. No blood thinners reported. Pt alert and oriented x4 states she has no pain right now but is dizzy, nauseated, and feels as thought she will pass out.

## 2018-10-22 NOTE — Telephone Encounter (Signed)
Spoke with patient. She states that she fell and hit her head on a curb last night. Was seen in ER. CT scan performed. Has been having headaches, nausea, and neck pain. Does not remember falling and is unsure of LOC. Did have vasovagal episode in ER and was thus kept over night for observation. Is at home now. Instructed patient to refrain from physical activity and to limit screen time over the weekend. Will see patient in office on Monday. Told to go into ER if symptoms worsen. Ie. Intractable headache, visual disturbances, or disorientation. Patient voices understanding.

## 2018-10-22 NOTE — ED Notes (Signed)
CBG 85- Informed patient I needed to ambulate her. Per patient and visitor, "Patient's bp dropped and they're going to give her an IV and bolus"

## 2018-10-22 NOTE — ED Provider Notes (Signed)
Received signout at the beginning of shift.  Patient tripped over a curb and fell last night after she was leaving a restaurant.  She admits having 2 glasses of wine but does not think she was intoxicated.  She came in due to feeling lightheadedness and facial injury.  Fortunately had, maxillofacial, and neck CT scan showed no concerning finding.  She does have a hematoma around her left orbital region, small laceration to the left upper lip and small bruising to the bridge of her nose.  She does have a left sub-conjunctiva hemorrhage.  She had a vasovagal episode while in the ED but after receiving Compazine and IV fluid and rest, she feels much better now.  She is able to ambulate without difficulty.  She does have history of vasovagal syncope in the past.  At this time she is stable for discharge.  Work note provided.  Outpatient follow-up with PCP is recommended.  Return precaution discussed.  BP 129/82   Pulse (!) 105   Temp 97.8 F (36.6 C) (Oral)   Resp 20   Ht 5\' 5"  (1.651 m)   Wt 46 kg   LMP 09/03/2000 (Approximate)   SpO2 96%   BMI 16.88 kg/m   Results for orders placed or performed during the hospital encounter of 10/22/18  CBC with Differential  Result Value Ref Range   WBC 6.5 4.0 - 10.5 K/uL   RBC 3.86 (L) 3.87 - 5.11 MIL/uL   Hemoglobin 12.4 12.0 - 15.0 g/dL   HCT 38.8 36.0 - 46.0 %   MCV 100.5 (H) 80.0 - 100.0 fL   MCH 32.1 26.0 - 34.0 pg   MCHC 32.0 30.0 - 36.0 g/dL   RDW 12.5 11.5 - 15.5 %   Platelets 141 (L) 150 - 400 K/uL   nRBC 0.0 0.0 - 0.2 %   Neutrophils Relative % 77 %   Neutro Abs 5.1 1.7 - 7.7 K/uL   Lymphocytes Relative 16 %   Lymphs Abs 1.0 0.7 - 4.0 K/uL   Monocytes Relative 5 %   Monocytes Absolute 0.3 0.1 - 1.0 K/uL   Eosinophils Relative 1 %   Eosinophils Absolute 0.1 0.0 - 0.5 K/uL   Basophils Relative 1 %   Basophils Absolute 0.0 0.0 - 0.1 K/uL   Immature Granulocytes 0 %   Abs Immature Granulocytes 0.02 0.00 - 0.07 K/uL  Basic metabolic  panel  Result Value Ref Range   Sodium 141 135 - 145 mmol/L   Potassium 3.9 3.5 - 5.1 mmol/L   Chloride 110 98 - 111 mmol/L   CO2 23 22 - 32 mmol/L   Glucose, Bld 97 70 - 99 mg/dL   BUN 13 6 - 20 mg/dL   Creatinine, Ser 0.54 0.44 - 1.00 mg/dL   Calcium 8.9 8.9 - 10.3 mg/dL   GFR calc non Af Amer >60 >60 mL/min   GFR calc Af Amer >60 >60 mL/min   Anion gap 8 5 - 15  CBG monitoring, ED  Result Value Ref Range   Glucose-Capillary 85 70 - 99 mg/dL   Ct Head Wo Contrast  Result Date: 10/22/2018 CLINICAL DATA:  Fall with bruising. EXAM: CT HEAD WITHOUT CONTRAST CT MAXILLOFACIAL WITHOUT CONTRAST CT CERVICAL SPINE WITHOUT CONTRAST TECHNIQUE: Multidetector CT imaging of the head, cervical spine, and maxillofacial structures were performed using the standard protocol without intravenous contrast. Multiplanar CT image reconstructions of the cervical spine and maxillofacial structures were also generated. COMPARISON:  Head CT 05/31/2005 FINDINGS: CT  HEAD FINDINGS Brain: No evidence of acute infarction, hemorrhage, hydrocephalus, extra-axial collection or mass lesion/mass effect. Vascular: No hyperdense vessel or unexpected calcification. Skull: Normal. Negative for fracture or focal lesion. CT MAXILLOFACIAL FINDINGS Osseous: Negative for fracture or mandibular dislocation. Mild irregularity of the left nasal bone is attributed to motion artifact Orbits: No evidence of postseptal injury. Evaluation is somewhat degraded by motion. Sinuses: Negative.  No hemosinus Soft tissues: Left cheek and periorbital contusion. CT CERVICAL SPINE FINDINGS Alignment: Reversal of cervical lordosis.  No traumatic malalignment Skull base and vertebrae: Negative for fracture. C5-6 ACDF with solid arthrodesis. Soft tissues and spinal canal: No prevertebral fluid or swelling. No visible canal hematoma. Disc levels: Lower cervical facet spurring. No evidence of cord impingement. Upper chest: Emphysema IMPRESSION: No evidence of acute  intracranial or cervical spine injury. Left-sided facial contusion without fracture. Electronically Signed   By: Monte Fantasia M.D.   On: 10/22/2018 04:55   Ct Cervical Spine Wo Contrast  Result Date: 10/22/2018 CLINICAL DATA:  Fall with bruising. EXAM: CT HEAD WITHOUT CONTRAST CT MAXILLOFACIAL WITHOUT CONTRAST CT CERVICAL SPINE WITHOUT CONTRAST TECHNIQUE: Multidetector CT imaging of the head, cervical spine, and maxillofacial structures were performed using the standard protocol without intravenous contrast. Multiplanar CT image reconstructions of the cervical spine and maxillofacial structures were also generated. COMPARISON:  Head CT 05/31/2005 FINDINGS: CT HEAD FINDINGS Brain: No evidence of acute infarction, hemorrhage, hydrocephalus, extra-axial collection or mass lesion/mass effect. Vascular: No hyperdense vessel or unexpected calcification. Skull: Normal. Negative for fracture or focal lesion. CT MAXILLOFACIAL FINDINGS Osseous: Negative for fracture or mandibular dislocation. Mild irregularity of the left nasal bone is attributed to motion artifact Orbits: No evidence of postseptal injury. Evaluation is somewhat degraded by motion. Sinuses: Negative.  No hemosinus Soft tissues: Left cheek and periorbital contusion. CT CERVICAL SPINE FINDINGS Alignment: Reversal of cervical lordosis.  No traumatic malalignment Skull base and vertebrae: Negative for fracture. C5-6 ACDF with solid arthrodesis. Soft tissues and spinal canal: No prevertebral fluid or swelling. No visible canal hematoma. Disc levels: Lower cervical facet spurring. No evidence of cord impingement. Upper chest: Emphysema IMPRESSION: No evidence of acute intracranial or cervical spine injury. Left-sided facial contusion without fracture. Electronically Signed   By: Monte Fantasia M.D.   On: 10/22/2018 04:55   Ct Maxillofacial Wo Contrast  Result Date: 10/22/2018 CLINICAL DATA:  Fall with bruising. EXAM: CT HEAD WITHOUT CONTRAST CT  MAXILLOFACIAL WITHOUT CONTRAST CT CERVICAL SPINE WITHOUT CONTRAST TECHNIQUE: Multidetector CT imaging of the head, cervical spine, and maxillofacial structures were performed using the standard protocol without intravenous contrast. Multiplanar CT image reconstructions of the cervical spine and maxillofacial structures were also generated. COMPARISON:  Head CT 05/31/2005 FINDINGS: CT HEAD FINDINGS Brain: No evidence of acute infarction, hemorrhage, hydrocephalus, extra-axial collection or mass lesion/mass effect. Vascular: No hyperdense vessel or unexpected calcification. Skull: Normal. Negative for fracture or focal lesion. CT MAXILLOFACIAL FINDINGS Osseous: Negative for fracture or mandibular dislocation. Mild irregularity of the left nasal bone is attributed to motion artifact Orbits: No evidence of postseptal injury. Evaluation is somewhat degraded by motion. Sinuses: Negative.  No hemosinus Soft tissues: Left cheek and periorbital contusion. CT CERVICAL SPINE FINDINGS Alignment: Reversal of cervical lordosis.  No traumatic malalignment Skull base and vertebrae: Negative for fracture. C5-6 ACDF with solid arthrodesis. Soft tissues and spinal canal: No prevertebral fluid or swelling. No visible canal hematoma. Disc levels: Lower cervical facet spurring. No evidence of cord impingement. Upper chest: Emphysema IMPRESSION: No evidence of acute  intracranial or cervical spine injury. Left-sided facial contusion without fracture. Electronically Signed   By: Monte Fantasia M.D.   On: 10/22/2018 04:55      Domenic Moras, PA-C 10/22/18 1019    Little, Wenda Overland, MD 10/22/18 [phone]

## 2018-10-22 NOTE — Discharge Instructions (Signed)
HEAD INJURY °If any of the following occur notify your physician or go to °the Hospital Emergency Department: ° Increased drowsiness, stupor or loss of consciousness ° Restlessness or convulsions (fits) ° Paralysis in arms or legs ° Temperature above 100º F ° Vomiting ° Severe headache ° Blood or clear fluid dripping from the nose or ears ° Stiffness of the neck ° Dizziness or blurred vision ° Pulsating pain in the eye ° Unequal pupils of eye ° Personality changes ° Any other unusual symptoms °PRECAUTIONS ° Do not take tranquilizers, sedatives, narcotics or alcohol ° Avoid aspirin. Use only acetaminophen (e.g. Tylenol) or °ibuprofen (e.g. Advil) for relief of pain. Follow directions °on the bottle for dosage. ° Use ice packs for comfort ° Getting plenty of rest and sleep helps the brain to heal. Do not try to do too much too fast. As you start to feel better, you can slowly and gradually return to your usual routine. ° Avoid activities that are physically demanding (e.g., sports, heavy housecleaning, exercising) or require a lot of thinking or concentration (e.g., working on the computer, playing video games). ° Ask your health care professional when you can safely drive a car, ride a bike, or operate heavy equipment. °MEDICATIONS °Use medications only as directed by your physician ° °Follow up with your primary care provider within 5-7 days for re-evaluation.  ° °

## 2018-10-23 NOTE — Progress Notes (Signed)
Subjective:   I, Jacqualin Combes, am serving as a scribe for Dr. Hulan Saas.  Chief Complaint: Emily Ward, DOB: 08-14-1956, is a 63 y.o. female who presents for head injury on 10/21/2018. Tripped over a curb. Patient presents today with ecchymosis over left eye. Does feel like she lost consciousness. Did have a syncopal episode in the ER. Has history of syncopal episodes. Patient had hysterectomy in December and she had a syncopal episode. Headaches have been occurring daily and constantly. Patient has history of tension headaches. Is using Tylenol. Is also having neck pain near the occiput. Patient is also having pain in the thoracic spine as it hurts to turn to the left. No visual changes. Works as a [employer] social research officer. Has not been back to work since injury.    Injury date : 10/21/2018 Visit #: 1  Previous imagine.   History of Present Illness:    Concussion Self-Reported Symptom Score Symptoms rated on a scale 1-6, in last 24 hours  Headache: 3   Nausea: 2  Vomiting: 0  Balance Difficulty:2  Dizziness: 3  Fatigue:3  Trouble Falling Asleep: 4  Sleep More Than Usual: 2  Sleep Less Than Usual: 0  Daytime Drowsiness: 1  Photophobia: 2  Phonophobia: 2  Irritability: 2  Sadness: 2  Nervousness: 2  Feeling More Emotional: 2  Numbness or Tingling: 0  Feeling Slowed Down: 2  Feeling Mentally Foggy: 3  Difficulty Concentrating:3  Difficulty Remembering: 3  Visual Problems: 0    Total Symptom Score: 43   Review of Systems: Pertinent items are noted in HPI.  Review of History: Past Medical History:  Past Medical History:  Diagnosis Date  . Cervical disc disease 2006  . Emphysema lung ([place])   . 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    . Fibromyalgia    sees Dr. Estanislado Pandy   . Heart murmur   . 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 Hyperlipidemia   . Migraines   . MVP (mitral valve prolapse)    no daily medication   . Peptic ulcer    no problems    Past Surgical History:  has a past surgical history that includes Cesarean section (1993); Cholecystectomy (06/2002); Cervical laminectomy (2006); Hysteroscopy; Dilatation & curettage/hysteroscopy with myosure (N/A, 04/12/2017); Urethral dilation; Total laparoscopic hysterectomy with salpingectomy (Bilateral, 09/01/2018); and Cystoscopy (N/A, 09/01/2018). Family History: family history includes Arthritis in her son; Breast cancer in her mother; Cancer in an other family member; Congestive Heart Failure in her father; Diabetes type II in her sister; Heart attack in her father; Heart disease in an other family member. no family history of autoimmune Social History:  reports that she quit smoking about 5 years ago. Her smoking use included cigarettes. She has a 20.00 pack-year smoking history. She has never used smokeless tobacco. She reports current alcohol use of about 1.0 standard drinks of alcohol per week. She reports that she does not use drugs. Current Medications: has a current medication list which includes the following prescription(s): butalbital-acetaminophen-caffeine, calcium, cetirizine, chlorzoxazone, cholecalciferol, cyanocobalamin, cyclobenzaprine, denosumab, duloxetine, estradiol, meloxicam, umeclidinium-vilanterol, zolmitriptan, and zolpidem. Allergies: is allergic to depakote [divalproex sodium] and nsaids.  Objective:    Physical Examination Vitals:   10/27/18 0954  BP: 102/78  Pulse: 92  SpO2: 96%   General: No apparent distress alert and oriented x3 mood and affect normal, dressed appropriately.  HEENT: Diffuse bruising noted around patient's left side.  Patient  is tender to palpation over the second the neck process but no crepitus noted.  No leaking coming from the left naris have some mild nystagmus noted with the horizontal testing Respiratory: Patient's speak in full sentences and does not appear short of breath  Cardiovascular: No lower  extremity edema, non tender, no erythema  Skin: Warm dry intact with no signs of infection or rash on extremities or on axial skeleton.  Abdomen: Soft nontender  Neuro: Cranial nerves II through XII are intact, neurovascularly intact in all extremities with 2+ DTRs and 2+ pulses.  Lymph: No lymphadenopathy of posterior or anterior cervical chain or axillae bilaterally.  Gait normal with good balance and coordination.  MSK:  Non tender with full range of motion and good stability and symmetric strength and tone of shoulders, elbows, wrist,  knee and ankles bilaterally.  Psychiatric: Oriented X3, intact recent and remote memory, judgement and insight, normal mood and affect  Concussion testing performed today:  I spent 45 minutes with patient discussing test and results including review of history and patient chart and  integration of patient data, interpretation of standardized test results and clinical data, clinical decision making, treatment planning and report,and interactive feedback to the patient with all of patients questions answered.     Vestibular Screening:       Headache  Dizziness  Smooth Pursuits y n  H. Saccades n n  V. Saccades n n  H. VOR n n  V. VOR n n  Visual Motor Sensitivity n y      Convergence:0 cm  n n    Additional testing performed today: Mild difficulty with recall noted today.   Assessment:    Concussion with loss of consciousness of 30 minutes or less, initial encounter  Emily Ward presents with the following concussion subtypes. [] Cognitive [] Cervical [] Vestibular [x] Ocular [x] Migraine [] Anxiety/Mood

## 2018-10-24 ENCOUNTER — Ambulatory Visit: Payer: 59 | Admitting: Physician Assistant

## 2018-10-27 ENCOUNTER — Ambulatory Visit: Payer: 59 | Admitting: Family Medicine

## 2018-10-27 ENCOUNTER — Encounter: Payer: Self-pay | Admitting: Family Medicine

## 2018-10-27 DIAGNOSIS — S060X1A Concussion with loss of consciousness of 30 minutes or less, initial encounter: Secondary | ICD-10-CM | POA: Diagnosis not present

## 2018-10-27 DIAGNOSIS — S060XAA Concussion with loss of consciousness status unknown, initial encounter: Secondary | ICD-10-CM | POA: Insufficient documentation

## 2018-10-27 DIAGNOSIS — S060X9A Concussion with loss of consciousness of unspecified duration, initial encounter: Secondary | ICD-10-CM | POA: Insufficient documentation

## 2018-10-27 NOTE — Assessment & Plan Note (Signed)
Concussion noted.  Discussed with patient in great length.  Has had a couple other syncopal episodes that seem to be secondary to when patient could have had relatively low iron.  We discussed the laboratory work-up.  Patient would like to try supplementation first.  We discussed over-the-counter medications that could be beneficial.  Patient does have a large contusion of the left eyelid but patient does have nystagmus of both eyes that is more concerning for the concussion.  Warned of other symptoms that would make patient seek medical attention sooner but I do believe patient will do well.  Patient will start with part-time work this week increased to 75% workload the following week and follow-up with me again in 2 weeks.  Patient is in agreement with the plan.

## 2018-10-27 NOTE — Patient Instructions (Signed)
Good to see you  Arnica lotion 2 times a day  Fish oil 2 grams daily  CoQ10 200mg  daily  Vitamin D 2000 IU daily  See me again in 2 weeks Part time 4 hours a day starting tomorrow for 1 week Then 6 hours a day after that

## 2018-10-31 NOTE — Progress Notes (Signed)
Office Visit Note  Patient: Emily Ward             Date of Birth: 01-08-1956           MRN: [id number]             PCP: Laurey Morale, MD Referring: Laurey Morale, MD Visit Date: 11/14/2018 Occupation: '@[email or web address]' @  Subjective:  Fatigue   History of Present Illness: Emily Ward is a 63 y.o. femalewith history of fibromyalgia, DDD, and osteoporosis.   She reports she fell on 10/21/18 after tripping over a cement block in a parking lot.  She reports she loss consciousness and was evaluated in the ED on 10/22/18.  She denies any fractures.  She states she continues to have intermittent headaches and dizziness.She states after the fall she started having fibromyalgia flares.  She has trapezius muscle spasms and tension and intercostal muscle tenderness.    She reports she had a hysterectomy on 09/02/19 and has had worsening fatigue over the past several months.  She continues to have some difficulty falling asleep and takes Ambien as needed.  She has been experiencing increased pain in both hands but denies any joint swelling.  She denies any other joint pain or joint swelling.   Activities of Daily Living:  Patient reports morning stiffness for 0 minutes.   Patient Denies nocturnal pain.  Difficulty dressing/grooming: Denies Difficulty climbing stairs: Denies Difficulty getting out of chair: Denies Difficulty using hands for taps, buttons, cutlery, and/or writing: Reports  Review of Systems  Constitutional: Positive for fatigue.  HENT: Positive for mouth dryness. Negative for mouth sores and nose dryness.   Eyes: Negative for pain, itching, visual disturbance and dryness.  Respiratory: Negative for cough, hemoptysis, shortness of breath, wheezing and difficulty breathing.   Cardiovascular: Negative for chest pain, palpitations, hypertension and swelling in legs/feet.  Gastrointestinal: Negative for abdominal pain, blood in stool, constipation and diarrhea.    Endocrine: Negative for increased urination.  Genitourinary: Negative for painful urination and pelvic pain.  Musculoskeletal: Positive for arthralgias, joint pain and joint swelling. Negative for myalgias, muscle weakness, morning stiffness, muscle tenderness and myalgias.  Skin: Negative for color change, pallor, rash, hair loss, nodules/bumps, redness, skin tightness, ulcers and sensitivity to sunlight.  Allergic/Immunologic: Negative for susceptible to infections.  Neurological: Positive for headaches. Negative for light-headedness and numbness.  Hematological: Negative for swollen glands.  Psychiatric/Behavioral: Negative for depressed mood and sleep disturbance. The patient is not nervous/anxious.     PMFS History:  Patient Active Problem List   Diagnosis Date Noted  . Concussion 10/27/2018  . Postmenopausal bleeding 09/01/2018  . MGUS (monoclonal gammopathy of unknown significance) 02/13/2018  . 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 Palpitations 07/30/2017  . Shortness of breath 07/30/2017  . Hormone replacement therapy (HRT) 03/30/2017  . Former smoker 03/30/2017  . Other fatigue 06/30/2016  . Insomnia 06/30/2016  . DJD (degenerative joint disease), cervical 06/30/2016  . 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 IBS (irritable bowel syndrome) 06/30/2016  . Interstitial cystitis 06/30/2016  . SHOULDER PAIN, RIGHT 09/05/2010  . GERD 03/24/2009  . SACROILIAC STRAIN 03/24/2009  . Atypical chest pain 01/20/2008  . HEMATURIA UNSPECIFIED 10/14/2007  . FIBROMYALGIA 07/03/2007  . HYPERLIPIDEMIA NEC/NOS 05/16/2007  . HEADACHE 05/09/2007    Past Medical History:  Diagnosis Date  . Cervical disc disease 2006  . Emphysema lung ([place])   . 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    . Fibromyalgia    sees Dr. Estanislado Pandy   . Heart murmur   . 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 Hyperlipidemia    . Migraines   . MVP (mitral valve prolapse)    no daily medication  . Peptic ulcer    no problems    Family History  Problem Relation Age of Onset  . Breast cancer Mother        lumpectomy in late 50/60  . Heart attack Father   . Congestive Heart Failure Father   . Cancer Other        breast/fhx  . Heart disease Other        fhx  . Diabetes type II Sister        and ? brother  . Arthritis Son        psoriatic  . Colon cancer Neg Hx   . Esophageal cancer Neg Hx   . Rectal cancer Neg Hx   . Stomach cancer Neg Hx    Past Surgical History:  Procedure Laterality Date  . CERVICAL LAMINECTOMY  2006  . CESAREAN SECTION  1993  . CHOLECYSTECTOMY  06/2002  . CYSTOSCOPY N/A 09/01/2018   Procedure: CYSTOSCOPY;  Surgeon: Megan Salon, MD;  Location: [facility];  Service: Gynecology;  Laterality: N/A;  . DILATATION & CURETTAGE/HYSTEROSCOPY WITH MYOSURE N/A 04/12/2017   Procedure: DILATATION & CURETTAGE/HYSTEROSCOPY;  Surgeon: Megan Salon, MD;  Location: [facility];  Service: Gynecology;  Laterality: N/A;  . HYSTEROSCOPY     years ago with Dr Radene Knee-? 2000  . TOTAL LAPAROSCOPIC HYSTERECTOMY WITH SALPINGECTOMY Bilateral 09/01/2018   Procedure: TOTAL LAPAROSCOPIC HYSTERECTOMY WITH SALPINGECTOMY  BSO, REPAIR OF PERINEAL LACERATION;  Surgeon: Megan Salon, MD;  Location: [facility];  Service: Gynecology;  Laterality: Bilateral;  possible BSO  . URETHRAL DILATION     Social History   Social History Narrative  . Not on file   Immunization History  Administered Date(s) Administered  . Influenza Whole 06/30/2007, 05/04/2010  . Influenza,inj,Quad PF,6+ Mos 07/12/2017, 05/23/2018  . Influenza-Unspecified 06/02/2014, 06/03/2016  . MMR 06/30/2007  . Td 09/03/1998, 05/05/2009  . Tdap 10/22/2018     Objective: Vital Signs: BP 100/72 (BP Location: Left Arm, Patient Position: Sitting, Cuff Size: Normal)   Pulse 80   Resp 12   Ht 5'  5" (1.651 m)   Wt 106 lb (48.1 kg)   LMP 09/03/2000 (Approximate)   BMI 17.64 kg/m    Physical Exam Vitals signs and nursing note reviewed.  Constitutional:      Appearance: She is well-developed.  HENT:     Head: Normocephalic and atraumatic.  Eyes:     Conjunctiva/sclera: Conjunctivae normal.  Neck:     Musculoskeletal: Normal range of motion.  Cardiovascular:     Rate and Rhythm: Normal rate and regular rhythm.     Heart sounds: Normal heart sounds.  Pulmonary:     Effort: Pulmonary effort is normal.     Breath sounds: Normal breath sounds.  Abdominal:     General: Bowel sounds are normal.     Palpations: Abdomen is soft.  Lymphadenopathy:     Cervical: No cervical adenopathy.  Skin:    General: Skin is warm and dry.     Capillary Refill: Capillary refill takes less than 2 seconds.  Neurological:     Mental Status: She is alert and oriented to person,  place, and time.  Psychiatric:        Behavior: Behavior normal.      Musculoskeletal Exam: C-spine, thoracic spine and lumbar spine good range of motion.  No midline spinal tenderness.  No SI joint tenderness.  Shoulder joints, elbow joints, wrist joints, MCPs, PIPs, DIPs good range of motion no synovitis.  She has complete fist formation bilaterally.  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  Hip joints, knee joints, ankle joints, MTPs, PIPs and DIPs good range of motion no synovitis.  No warmth or effusion bilateral knee joints.  No tenderness or swelling of ankle joints.  No tenderness over trochanter bursa bilaterally.  CDAI Exam: CDAI Score: Not documented Patient Global Assessment: Not documented; Provider Global Assessment: Not documented Swollen: Not documented; Tender: Not documented Joint Exam   Not documented   There is currently no information documented on the homunculus. Go to the Rheumatology activity and complete the homunculus joint exam.  Investigation: No  additional findings.  Imaging: Ct Head Wo Contrast  Result Date: 10/22/2018 CLINICAL DATA:  Fall with bruising. EXAM: CT HEAD WITHOUT CONTRAST CT MAXILLOFACIAL WITHOUT CONTRAST CT CERVICAL SPINE WITHOUT CONTRAST TECHNIQUE: Multidetector CT imaging of the head, cervical spine, and maxillofacial structures were performed using the standard protocol without intravenous contrast. Multiplanar CT image reconstructions of the cervical spine and maxillofacial structures were also generated. COMPARISON:  Head CT 05/31/2005 FINDINGS: CT HEAD FINDINGS Brain: No evidence of acute infarction, hemorrhage, hydrocephalus, extra-axial collection or mass lesion/mass effect. Vascular: No hyperdense vessel or unexpected calcification. Skull: Normal. Negative for fracture or focal lesion. CT MAXILLOFACIAL FINDINGS Osseous: Negative for fracture or mandibular dislocation. Mild irregularity of the left nasal bone is attributed to motion artifact Orbits: No evidence of postseptal injury. Evaluation is somewhat degraded by motion. Sinuses: Negative.  No hemosinus Soft tissues: Left cheek and periorbital contusion. CT CERVICAL SPINE FINDINGS Alignment: Reversal of cervical lordosis.  No traumatic malalignment Skull base and vertebrae: Negative for fracture. C5-6 ACDF with solid arthrodesis. Soft tissues and spinal canal: No prevertebral fluid or swelling. No visible canal hematoma. Disc levels: Lower cervical facet spurring. No evidence of cord impingement. Upper chest: Emphysema IMPRESSION: No evidence of acute intracranial or cervical spine injury. Left-sided facial contusion without fracture. Electronically Signed   By: Monte Fantasia M.D.   On: 10/22/2018 04:55   Ct Cervical Spine Wo Contrast  Result Date: 10/22/2018 CLINICAL DATA:  Fall with bruising. EXAM: CT HEAD WITHOUT CONTRAST CT MAXILLOFACIAL WITHOUT CONTRAST CT CERVICAL SPINE WITHOUT CONTRAST TECHNIQUE: Multidetector CT imaging of the head, cervical spine, and  maxillofacial structures were performed using the standard protocol without intravenous contrast. Multiplanar CT image reconstructions of the cervical spine and maxillofacial structures were also generated. COMPARISON:  Head CT 05/31/2005 FINDINGS: CT HEAD FINDINGS Brain: No evidence of acute infarction, hemorrhage, hydrocephalus, extra-axial collection or mass lesion/mass effect. Vascular: No hyperdense vessel or unexpected calcification. Skull: Normal. Negative for fracture or focal lesion. CT MAXILLOFACIAL FINDINGS Osseous: Negative for fracture or mandibular dislocation. Mild irregularity of the left nasal bone is attributed to motion artifact Orbits: No evidence of postseptal injury. Evaluation is somewhat degraded by motion. Sinuses: Negative.  No hemosinus Soft tissues: Left cheek and periorbital contusion. CT CERVICAL SPINE FINDINGS Alignment: Reversal of cervical lordosis.  No traumatic malalignment Skull base and vertebrae: Negative for fracture. C5-6 ACDF with solid arthrodesis. Soft tissues and spinal canal: No prevertebral fluid or swelling. No visible canal hematoma. Disc levels: Lower cervical facet  spurring. No evidence of cord impingement. Upper chest: Emphysema IMPRESSION: No evidence of acute intracranial or cervical spine injury. Left-sided facial contusion without fracture. Electronically Signed   By: Monte Fantasia M.D.   On: 10/22/2018 04:55   Ct Maxillofacial Wo Contrast  Result Date: 10/22/2018 CLINICAL DATA:  Fall with bruising. EXAM: CT HEAD WITHOUT CONTRAST CT MAXILLOFACIAL WITHOUT CONTRAST CT CERVICAL SPINE WITHOUT CONTRAST TECHNIQUE: Multidetector CT imaging of the head, cervical spine, and maxillofacial structures were performed using the standard protocol without intravenous contrast. Multiplanar CT image reconstructions of the cervical spine and maxillofacial structures were also generated. COMPARISON:  Head CT 05/31/2005 FINDINGS: CT HEAD FINDINGS Brain: No evidence of acute  infarction, hemorrhage, hydrocephalus, extra-axial collection or mass lesion/mass effect. Vascular: No hyperdense vessel or unexpected calcification. Skull: Normal. Negative for fracture or focal lesion. CT MAXILLOFACIAL FINDINGS Osseous: Negative for fracture or mandibular dislocation. Mild irregularity of the left nasal bone is attributed to motion artifact Orbits: No evidence of postseptal injury. Evaluation is somewhat degraded by motion. Sinuses: Negative.  No hemosinus Soft tissues: Left cheek and periorbital contusion. CT CERVICAL SPINE FINDINGS Alignment: Reversal of cervical lordosis.  No traumatic malalignment Skull base and vertebrae: Negative for fracture. C5-6 ACDF with solid arthrodesis. Soft tissues and spinal canal: No prevertebral fluid or swelling. No visible canal hematoma. Disc levels: Lower cervical facet spurring. No evidence of cord impingement. Upper chest: Emphysema IMPRESSION: No evidence of acute intracranial or cervical spine injury. Left-sided facial contusion without fracture. Electronically Signed   By: Monte Fantasia M.D.   On: 10/22/2018 04:55    Recent Labs: Lab Results  Component Value Date   WBC 6.5 10/22/2018   HGB 12.4 10/22/2018   PLT 141 (L) 10/22/2018   NA 141 10/22/2018   K 3.9 10/22/2018   CL 110 10/22/2018   CO2 23 10/22/2018   GLUCOSE 97 10/22/2018   BUN 13 10/22/2018   CREATININE 0.54 10/22/2018   BILITOT 0.4 09/08/2018   ALKPHOS 73 09/08/2018   AST 25 09/08/2018   ALT 13 09/08/2018   PROT 6.5 09/08/2018   ALBUMIN 4.3 09/08/2018   CALCIUM 8.9 10/22/2018   GFRAA >60 10/22/2018    Speciality Comments: No specialty comments available.  Procedures:  No procedures performed Allergies: Depakote [divalproex sodium] and Nsaids   Assessment / Plan:     Visit Diagnoses: Fibromyalgia -Overall her fibromyalgia pain has been more manageable.  She fell on 08/22/2019 and was evaluated in the ED due to loss of consciousness after tripping over a cement  block in the parking lot and injured her face she did not have any fractures.  Since the fall she has been having increased myalgias and muscle tenderness.  She continues to take Cymbalta 60 mg and flexeril 10 mg prn for muscle spasms.  She does not need any refills.  She continues have chronic fatigue and insomnia.  She has difficulty falling asleep at night and occasionally will take Ambien 10 mg by mouth.  Basis importance of regular exercise and good sleep hygiene.  She will follow-up in the office in 6 months.  Primary insomnia -She takes Ambien 10 mg p.o. at bedtime PRN.  She has difficulty falling asleep but is able to stay asleep.  Other fatigue: She continues to have chronic fatigue.  She had hysterectomy on 09/01/2018 and has had worsening fatigue since.  She also had a fall on 10/22/2018 which has worsened her fatigue.  She continues to have some difficulty falling asleep at night  and takes Ambien 10 mg as needed.  DDD (degenerative disc disease), cervical: She is good range of motion with no discomfort.  She is no symptoms of radiculopathy at this time.  DDD (degenerative disc disease), lumbar: She has no midline spinal tenderness.  She is good range of motion with no discomfort at this time.  Age-related osteoporosis without current pathological fracture - DEXA 12/13/17: The BMD measured at Femur Total Left is 0.516 g/cm2 with a T-scoreof -3.9.She takes Calcium and vitamin D supplements.  Prolia sq inj q6 months  Other medical conditions are listed as follows:  Petechiae  History of gastroesophageal reflux (GERD)  History of IBS  Interstitial cystitis   Orders: No orders of the defined types were placed in this encounter.  No orders of the defined types were placed in this encounter.     Follow-Up Instructions: Return in about 6 months (around 05/17/2019) for Fibromyalgia, Osteoporosis.   Ofilia Neas, PA-C  Note - This record has been created using Dragon software.    Chart creation errors have been sought, but may not always  have been located. Such creation errors do not reflect on  the standard of medical care.

## 2018-11-09 NOTE — Progress Notes (Signed)
Subjective:   I, Jacqualin Combes, am serving as a scribe for Dr. Hulan Saas.  Chief Complaint: Emily Ward, DOB: 1956-01-14, is a 63 y.o. female who presents for follow up for a head injury. Patient has been working part time as a Government social research officer. Patient states her only remaining symptom is daily headaches. She is back to full time hours. Pain develops with quick head movements but goes away quickly. Does occasionally have frontal headaches that can stay all day but is less intense.    Injury date : 10/27/2018 Visit #: 2    Review of Systems: Pertinent items are noted in HPI.  Review of History: Past Medical History:  Past Medical History:  Diagnosis Date  . Cervical disc disease 2006  . Emphysema lung ([place])   . 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    . Fibromyalgia    sees Dr. Estanislado Pandy   . Heart murmur   . 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 Hyperlipidemia   . Migraines   . MVP (mitral valve prolapse)    no daily medication  . Peptic ulcer    no problems    Past Surgical History:  has a past surgical history that includes Cesarean section (1993); Cholecystectomy (06/2002); Cervical laminectomy (2006); Hysteroscopy; Dilatation & curettage/hysteroscopy with myosure (N/A, 04/12/2017); Urethral dilation; Total laparoscopic hysterectomy with salpingectomy (Bilateral, 09/01/2018); and Cystoscopy (N/A, 09/01/2018). Family History: family history includes Arthritis in her son; Breast cancer in her mother; Cancer in an other family member; Congestive Heart Failure in her father; Diabetes type II in her sister; Heart attack in her father; Heart disease in an other family member. no family history of autoimmune Social History:  reports that she quit smoking about 5 years ago. Her smoking use included cigarettes. She has a 20.00 pack-year smoking history. She has never used smokeless tobacco. She reports current alcohol use of  about 1.0 standard drinks of alcohol per week. She reports that she does not use drugs. Current Medications: has a current medication list which includes the following prescription(s): butalbital-acetaminophen-caffeine, calcium, cetirizine, chlorzoxazone, cholecalciferol, cyanocobalamin, cyclobenzaprine, denosumab, duloxetine, estradiol, meloxicam, umeclidinium-vilanterol, zolmitriptan, and zolpidem. Allergies: is allergic to depakote [divalproex sodium] and nsaids.  Objective:    Physical Examination Vitals:   11/10/18 1541  BP: 102/74  Pulse: 78  SpO2: 97%   General: No apparent distress alert and oriented x3 mood and affect normal, dressed appropriately.  Bruising around the eyes significantly better at this moment on the left side. HEENT: Pupils equal, extraocular movements intact  Respiratory: Patient's speak in full sentences and does not appear short of breath  Cardiovascular: No lower extremity edema, non tender, no erythema  Skin: Warm dry intact with no signs of infection or rash on extremities or on axial skeleton.  Abdomen: Soft nontender  Neuro: Cranial nerves II through XII are intact, neurovascularly intact in all extremities with 2+ DTRs and 2+ pulses.  Lymph: No lymphadenopathy of posterior or anterior cervical chain or axillae bilaterally.  Gait normal with good balance and coordination.  MSK:  Non tender with full range of motion and good stability and symmetric strength and tone of shoulders, elbows, wrist,  knee and ankles bilaterally.  Psychiatric: Oriented X3, intact recent and remote memory, judgement and insight, normal mood and affect.  Moderately anxious  Concussion testing performed today: Did very well with serial sevens and balance testing of 30 out of 30   This  note was reviewed in its entirety and is accurate Lyndal Pulley

## 2018-11-10 ENCOUNTER — Ambulatory Visit: Payer: 59 | Admitting: Family Medicine

## 2018-11-10 ENCOUNTER — Encounter: Payer: Self-pay | Admitting: Family Medicine

## 2018-11-10 DIAGNOSIS — S060X1D Concussion with loss of consciousness of 30 minutes or less, subsequent encounter: Secondary | ICD-10-CM

## 2018-11-10 NOTE — Patient Instructions (Addendum)
Good to see you  CoQ10 400mg  daily still for the headache.  Stay hydrated well as well  I think you will be great but keep an appointment in 3 weeks but if perfect please cancel and enjoy spring!

## 2018-11-10 NOTE — Assessment & Plan Note (Signed)
Nearly completely resolved at this time.  Does have an underlying autoimmune disease and we will could consider further work-up but I think patient is doing significantly better.  No significant findings in the knee think that there will be any long-term issues with patient's concussion.  As long as patient is well will follow-up as needed

## 2018-11-11 ENCOUNTER — Encounter: Payer: Self-pay | Admitting: Obstetrics & Gynecology

## 2018-11-11 ENCOUNTER — Ambulatory Visit (INDEPENDENT_AMBULATORY_CARE_PROVIDER_SITE_OTHER): Payer: 59 | Admitting: Obstetrics & Gynecology

## 2018-11-11 VITALS — BP 130/70 | HR 72 | Resp 16 | Ht 65.0 in | Wt 107.0 lb

## 2018-11-11 DIAGNOSIS — Z9889 Other specified postprocedural states: Secondary | ICD-10-CM

## 2018-11-11 MED ORDER — TERCONAZOLE 0.4 % VA CREA: 1.0000 | TOPICAL_CREAM | Freq: Every day | VAGINAL | 0 refills | Status: DC

## 2018-11-11 NOTE — Progress Notes (Signed)
Post Operative Visit  Procedure: Total Laparoscopic Hysterectomy with Salpingectomy BSO. Repair of Perineal Laceration, Cystoscopy.  Days Post-op: 71  Subjective: Doing well from surgical standpoint.  Energy is good.  Working full tine.  Bowel and bladder function are normal.  Had stich present at vaginal apex at last visit so here, specifically, for recheck.  Had fall since last visit when she tripped over a curb.  [address] and had extensive facial bruising as well as a concussion.  Is having some ild headache issues.  This has only started since fall.  Did go to ER and had full evaluation.    Objective: BP 130/70 (BP Location: Right Arm, Patient Position: Sitting, Cuff Size: Normal)   Pulse 72   Resp 16   Ht 5\' 5"  (1.651 m)   Wt 107 lb (48.5 kg)   LMP 09/03/2000 (Approximate)   BMI 17.81 kg/m   EXAM General: alert and no distress GI: soft, non-tender; bowel sounds normal; no masses,  no organomegaly Extremities: extremities normal, atraumatic, no cyanosis or edema Vaginal Bleeding: none  Gyn:  NAEFG, vagina without lesions, white adherent discharge noted c/w yeast, cuff intact, uterus and cevix are surgically absent, sutures have dissolved, no masses noted  Assessment: s/p TLH/BSO 09/01/2018 Yeast vaginitis noted  Plan: Terazol 7, one applicator full nightly x 7 nights Pt may resume sexual activity Return for AEX already scheduled

## 2018-11-14 ENCOUNTER — Other Ambulatory Visit: Payer: Self-pay

## 2018-11-14 ENCOUNTER — Ambulatory Visit: Payer: 59 | Admitting: Physician Assistant

## 2018-11-14 ENCOUNTER — Encounter: Payer: Self-pay | Admitting: Physician Assistant

## 2018-11-14 VITALS — BP 100/72 | HR 80 | Resp 12 | Ht 65.0 in | Wt 106.0 lb

## 2018-11-14 DIAGNOSIS — Z8719 Personal history of other diseases of the digestive system: Secondary | ICD-10-CM

## 2018-11-14 DIAGNOSIS — R233 Spontaneous ecchymoses: Secondary | ICD-10-CM

## 2018-11-14 DIAGNOSIS — R5383 Other fatigue: Secondary | ICD-10-CM | POA: Diagnosis not present

## 2018-11-14 DIAGNOSIS — N3010 Interstitial cystitis (chronic) without hematuria: Secondary | ICD-10-CM

## 2018-11-14 DIAGNOSIS — M810 Age-related osteoporosis without current pathological fracture: Secondary | ICD-10-CM

## 2018-11-14 DIAGNOSIS — M797 Fibromyalgia: Secondary | ICD-10-CM | POA: Diagnosis not present

## 2018-11-14 DIAGNOSIS — F5101 Primary insomnia: Secondary | ICD-10-CM | POA: Diagnosis not present

## 2018-11-14 DIAGNOSIS — M5136 Other intervertebral disc degeneration, lumbar region: Secondary | ICD-10-CM

## 2018-11-14 DIAGNOSIS — M5030 Other cervical disc degeneration, unspecified cervical region: Secondary | ICD-10-CM

## 2018-11-16 ENCOUNTER — Other Ambulatory Visit: Payer: Self-pay | Admitting: Rheumatology

## 2018-11-17 ENCOUNTER — Other Ambulatory Visit: Payer: Self-pay | Admitting: Family Medicine

## 2018-11-17 NOTE — Telephone Encounter (Signed)
Last Visit: 11/14/18 Next Visit: 05/15/19  Okay to refill per Dr. Deveshwar 

## 2018-11-20 NOTE — Telephone Encounter (Signed)
Dr. Sarajane Jews please advise on refill of flexeril.  Thanks

## 2018-11-28 ENCOUNTER — Ambulatory Visit: Payer: 59 | Admitting: Pulmonary Disease

## 2018-12-04 ENCOUNTER — Ambulatory Visit (INDEPENDENT_AMBULATORY_CARE_PROVIDER_SITE_OTHER): Payer: 59 | Admitting: Family Medicine

## 2018-12-04 ENCOUNTER — Encounter: Payer: Self-pay | Admitting: Family Medicine

## 2018-12-04 DIAGNOSIS — S060X1D Concussion with loss of consciousness of 30 minutes or less, subsequent encounter: Secondary | ICD-10-CM

## 2018-12-04 NOTE — Progress Notes (Signed)
[facility] [place], [place] [address] Phone: [phone] Subjective:   Virtual Visit via Video Note  I connected with Emily Ward on 12/04/18 at  3:15 PM EDT by a video enabled telemedicine application and verified that I am speaking with the correct person using two identifiers.   I discussed the limitations of evaluation and management by telemedicine and the availability of in person appointments. The patient expressed understanding and agreed to proceed.  History of Present Illness:    Observations/Objective:   Assessment and Plan:   Follow Up Instructions:    I discussed the assessment and treatment plan with the patient. The patient was provided an opportunity to ask questions and all were answered. The patient agreed with the plan and demonstrated an understanding of the instructions.   The patient was advised to call back or seek an in-person evaluation if the symptoms worsen or if the condition fails to improve as anticipated.    Lyndal Pulley, DO   More detailed note below  CC: Head injury follow-up  UVO:[id number]  Emily Ward is a 63 y.o. female coming in with complaint of patient did have a head injury and continued to have posttraumatic headaches.  States that the head feeling significantly better at this time.  Having very minimal headaches.  Only has a headache when she drinks too much wine       Past Medical History:  Diagnosis Date  . Cervical disc disease 2006  . Emphysema lung ([place])   . 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    . Fibromyalgia    sees Dr. Estanislado Pandy   . Heart murmur   . 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 Hyperlipidemia   . Migraines   . MVP (mitral valve prolapse)    no daily medication  . Peptic ulcer    no problems   Past Surgical History:  Procedure Laterality Date  . CERVICAL LAMINECTOMY  2006  .  CESAREAN SECTION  1993  . CHOLECYSTECTOMY  06/2002  . CYSTOSCOPY N/A 09/01/2018   Procedure: CYSTOSCOPY;  Surgeon: Megan Salon, MD;  Location: [facility];  Service: Gynecology;  Laterality: N/A;  . DILATATION & CURETTAGE/HYSTEROSCOPY WITH MYOSURE N/A 04/12/2017   Procedure: DILATATION & CURETTAGE/HYSTEROSCOPY;  Surgeon: Megan Salon, MD;  Location: [facility];  Service: Gynecology;  Laterality: N/A;  . HYSTEROSCOPY     years ago with Dr Radene Knee-? 2000  . TOTAL LAPAROSCOPIC HYSTERECTOMY WITH SALPINGECTOMY Bilateral 09/01/2018   Procedure: TOTAL LAPAROSCOPIC HYSTERECTOMY WITH SALPINGECTOMY  BSO, REPAIR OF PERINEAL LACERATION;  Surgeon: Megan Salon, MD;  Location: [facility];  Service: Gynecology;  Laterality: Bilateral;  possible BSO  . URETHRAL DILATION     Social History   Socioeconomic History  . Marital status: Married    Spouse name: Not on file  . Number of children: Not on file  . Years of education: Not on file  . Highest education level: Not on file  Occupational History  . Not on file  Social Needs  . Financial resource strain: Not on file  . Food insecurity:    Worry: Not on file    Inability: Not on file  . Transportation needs:    Medical: Not on file    Non-medical: Not on file  Tobacco Use  . Smoking status: Former Smoker  Packs/day: 1.00    Years: 20.00    Pack years: 20.00    Types: Cigarettes    Last attempt to quit: 04/02/2013    Years since quitting: 5.6  . Smokeless tobacco: Never Used  Substance and Sexual Activity  . Alcohol use: Yes    Alcohol/week: 1.0 standard drinks    Types: 1 Glasses of wine per week    Comment: occ  . Drug use: Never  . Sexual activity: Never    Partners: Male  Lifestyle  . Physical activity:    Days per week: Not on file    Minutes per session: Not on file  . Stress: Not on file  Relationships  . Social connections:    Talks on phone: Not on file    Gets  together: Not on file    Attends religious service: Not on file    Active member of club or organization: Not on file    Attends meetings of clubs or organizations: Not on file    Relationship status: Not on file  Other Topics Concern  . Not on file  Social History Narrative  . Not on file   Allergies  Allergen Reactions  . Depakote [Divalproex Sodium]     Drug inducted hepatitis  . Nsaids     Upset stomach   Family History  Problem Relation Age of Onset  . Breast cancer Mother        lumpectomy in late 50/60  . Heart attack Father   . Congestive Heart Failure Father   . Cancer Other        breast/fhx  . Heart disease Other        fhx  . Diabetes type II Sister        and ? brother  . Arthritis Son        psoriatic  . Colon cancer Neg Hx   . Esophageal cancer Neg Hx   . Rectal cancer Neg Hx   . Stomach cancer Neg Hx     Current Outpatient Medications (Endocrine & Metabolic):  .  denosumab (PROLIA) 60 MG/ML SOSY injection, Inject 60 mg into the skin every 6 (six) months. Marland Kitchen  estradiol (ESTRACE) 1 MG tablet, TAKE 1 TABLET(1 MG) BY MOUTH DAILY (Patient taking differently: Take 1 mg by mouth daily. )   Current Outpatient Medications (Respiratory):  .  cetirizine (ZYRTEC) 10 MG tablet, Take 10 mg by mouth daily as needed for allergies (spring time).  Marland Kitchen  umeclidinium-vilanterol (ANORO ELLIPTA) 62.5-25 MCG/INH AEPB, Inhale 1 puff into the lungs daily.  Current Outpatient Medications (Analgesics):  .  butalbital-acetaminophen-caffeine (FIORICET, ESGIC) 50-325-40 MG tablet, TAKE 1 TABLET BY MOUTH EVERY 6 HOURS AS NEEDED .  meloxicam (MOBIC) 7.5 MG tablet, TAKE 1 TABLET(7.5 MG) BY MOUTH DAILY (Patient taking differently: Take 7.5 mg by mouth daily as needed for pain. ) .  zolmitriptan (ZOMIG) 5 MG tablet, TAKE 1 TABLET BY MOUTH AS NEEDED (Patient taking differently: Take 5 mg by mouth as needed for migraine. )  Current Outpatient Medications (Hematological):  Marland Kitchen   Cyanocobalamin (VITAMIN B-12 PO), Take 1 tablet by mouth daily.   Current Outpatient Medications (Other):  Marland Kitchen  CALCIUM PO, Take 1 tablet by mouth daily. .  chlorzoxazone (PARAFON) 500 MG tablet, TAKE 1 TABLET BY MOUTH FOUR TIMES DAILY AS NEEDED FOR MUSCLE SPASMS (Patient taking differently: Take 500 mg by mouth 4 (four) times daily as needed for muscle spasms. ) .  cholecalciferol (VITAMIN D) 1000 units  tablet, Take 2,000 Units by mouth daily.  Marland Kitchen  co-enzyme Q-10 30 MG capsule, Take 30 mg by mouth daily. .  cyclobenzaprine (FLEXERIL) 10 MG tablet, TAKE 1 TABLET BY MOUTH THREE TIMES DAILY AS NEEDED FOR MUSCLE SPASMS .  DULoxetine (CYMBALTA) 30 MG capsule, TAKE 2 CAPSULES BY MOUTH EVERY MORNING .  terconazole (TERAZOL 7) 0.4 % vaginal cream, Place 1 applicator vaginally at bedtime. Marland Kitchen  zolpidem (AMBIEN) 10 MG tablet, Take 1 tablet (10 mg total) by mouth at bedtime. (Patient taking differently: Take 10 mg by mouth at bedtime as needed for sleep. )    Past medical history, social, surgical and family history all reviewed in electronic medical record.  No pertanent information unless stated regarding to the chief complaint.   Review of Systems:  No , visual changes, nausea, vomiting, diarrhea, constipation, dizziness, abdominal pain, skin rash, fevers, chills, night sweats, weight loss, swollen lymph nodes, body aches, joint swelling, chest pain, shortness of breath, mood changes.  Positive muscle aches and mild headaches  Objective    General: No apparent distress alert and oriented x3 mood and affect normal, dressed appropriately.  Patient's previous black eye that she had previously has completely resolved    Impression and Recommendations:    . The above documentation has been reviewed and is accurate and complete Lyndal Pulley, DO       Note: This dictation was prepared with Dragon dictation along with smaller phrase technology. Any transcriptional errors that result from this process  are unintentional.

## 2018-12-04 NOTE — Assessment & Plan Note (Signed)
Completely resolved at this time.  Discussed which activities of doing which wants to avoid.  Aslong as patient is well can follow-up as needed

## 2018-12-05 ENCOUNTER — Ambulatory Visit: Payer: 59 | Admitting: Obstetrics & Gynecology

## 2018-12-12 ENCOUNTER — Other Ambulatory Visit: Payer: Self-pay | Admitting: Obstetrics & Gynecology

## 2018-12-15 ENCOUNTER — Other Ambulatory Visit: Payer: Self-pay

## 2018-12-15 NOTE — Telephone Encounter (Signed)
Medication refill request: Estradiol Last AEX:  09/27/17 SM Next AEX: 09/21/19 Last MMG (if hormonal medication request): 06/27/18 BIRADS 1 negative/density b Refill authorized: Order pended for #90 w/1 refill if authorized

## 2018-12-15 NOTE — Telephone Encounter (Signed)
Opened in error

## 2018-12-16 ENCOUNTER — Other Ambulatory Visit: Payer: Self-pay | Admitting: *Deleted

## 2018-12-16 MED ORDER — UMECLIDINIUM-VILANTEROL 62.5-25 MCG/INH IN AEPB: 1.0000 | INHALATION_SPRAY | Freq: Every day | RESPIRATORY_TRACT | 2 refills | Status: DC

## 2018-12-30 ENCOUNTER — Ambulatory Visit: Payer: Self-pay

## 2018-12-30 NOTE — Telephone Encounter (Signed)
Patient called and says she works in the [employer], was at work today and started having chills, headache, sore throat, temp 100.1. She was told to come home and was given the instructions to have Dr. Sarajane Jews to contact Dr. Dorita Sciara at [phone] to give authorization for COVID testing, which will be done at the health department. She says she has body aches, no cough, no SOB, mild sinus pressure. I advised to self isolate, advised I will send this over to the office and someone will call tomorrow to schedule an office visit, care advice given, patient verbalized understanding.  Reason for Disposition . [1] Fever (or feeling feverish) OR cough AND [2] within 14 Days of COVID-19 EXPOSURE (Close Contact)  Answer Assessment - Initial Assessment Questions 1. CLOSE CONTACT: "Who is the person with the confirmed or suspected COVID-19 infection that you were exposed to?"     I don't know, but a client in the same building I work was positive. 2. PLACE of CONTACT: "Where were you when you were exposed to COVID-19?" (e.g., home, school, medical waiting room; which city?)     [facility] Department 3. TYPE of CONTACT: "How much contact was there?" (e.g., sitting next to, live in same house, work in same office, same building)      Screening at a tent at work with a mask last day on 12/17/18 4. DURATION of CONTACT: "How long were you in contact with the COVID-19 patient?" (e.g., a few seconds, passed by person, a few minutes, live with the patient)     A few minutes asking the patient's questions. 5. DATE of CONTACT: "When did you have contact with a COVID-19 patient?" (e.g., how many days ago)     12/17/18 last work day in the screening tent 6. TRAVEL: "Have you traveled out of the country recently?" If so, "When and where?"     * Also ask about out-of-state travel, since the CDC has identified some high risk cities for community spread in the [place].     * Note: Travel becomes  less relevant if there is widespread community transmission where the patient lives.     No 7. COMMUNITY SPREAD: "Are there lots of cases or COVID-19 (community spread) where you live?" (See public health department website, if unsure)   * MAJOR community spread: high number of cases; numbers of cases are increasing; many people hospitalized.   * MINOR community spread: low number of cases; not increasing; few or no people hospitalized     No 8. SYMPTOMS: "Do you have any symptoms?" (e.g., fever, cough, breathing difficulty)     Fever (100.1), chills, body aches, sore throat, headache, sinus pressure (mild) 9. PREGNANCY OR POSTPARTUM: "Is there any chance you are pregnant?" "When was your last menstrual period?" "Did you deliver in the last 2 weeks?"     No 10. HIGH RISK: "Do you have any heart or lung problems? Do you have a weak immune system?" (e.g., CHF, COPD, asthma, HIV positive, chemotherapy, renal failure, diabetes mellitus, sickle cell anemia)      Possible COPD  Protocols used: CORONAVIRUS (COVID-19) EXPOSURE-A-AH

## 2018-12-31 NOTE — Telephone Encounter (Signed)
Give the verbal order to test for Covid-19

## 2018-12-31 NOTE — Telephone Encounter (Signed)
Dr. Fry please advise. Thanks  

## 2018-12-31 NOTE — Telephone Encounter (Signed)
The order has been faxed to the health dept for the pt to be tested for the covid.

## 2019-01-09 DIAGNOSIS — M810 Age-related osteoporosis without current pathological fracture: Secondary | ICD-10-CM | POA: Diagnosis not present

## 2019-01-25 ENCOUNTER — Other Ambulatory Visit: Payer: Self-pay | Admitting: Family Medicine

## 2019-01-30 NOTE — Telephone Encounter (Signed)
Dr. Fry please advise. Thanks  

## 2019-01-30 NOTE — Telephone Encounter (Signed)
Patient is calling to check status of this refill. Requested on 5/24. Pharmacy told her to contact provider

## 2019-02-08 ENCOUNTER — Other Ambulatory Visit: Payer: Self-pay | Admitting: Rheumatology

## 2019-02-09 NOTE — Telephone Encounter (Signed)
Last Visit: 11/14/18 Next Visit: 05/15/19  Okay to refill per Dr. Estanislado Pandy

## 2019-02-26 ENCOUNTER — Other Ambulatory Visit: Payer: Self-pay | Admitting: Pulmonary Disease

## 2019-02-27 ENCOUNTER — Telehealth: Payer: Self-pay | Admitting: Pulmonary Disease

## 2019-02-27 NOTE — Telephone Encounter (Signed)
Spoke with patient. Patient had questions about COVID test date and time. Talked with her about options. Patient decided to stay with the scheduled appointment. Nothing further needed at this time.

## 2019-02-27 NOTE — Telephone Encounter (Signed)
Attempted to call the patient back about COVID testing.  Patient needs the testing prior to PFT.  Patient did no answer. Left voicemail to call back.

## 2019-03-02 ENCOUNTER — Other Ambulatory Visit (HOSPITAL_COMMUNITY)
Admission: RE | Admit: 2019-03-02 | Discharge: 2019-03-02 | Disposition: A | Discharge status: Home / Self Care | Payer: 59 | Source: Ambulatory Visit | Attending: Pulmonary Disease | Admitting: Pulmonary Disease

## 2019-03-02 DIAGNOSIS — Z01812 Encounter for preprocedural laboratory examination: Secondary | ICD-10-CM | POA: Insufficient documentation

## 2019-03-02 DIAGNOSIS — Z1159 Encounter for screening for other viral diseases: Secondary | ICD-10-CM | POA: Insufficient documentation

## 2019-03-02 LAB — SARS CORONAVIRUS 2 (TAT 6-24 HRS): SARS Coronavirus 2: NEGATIVE

## 2019-03-05 ENCOUNTER — Ambulatory Visit (INDEPENDENT_AMBULATORY_CARE_PROVIDER_SITE_OTHER): Payer: 59 | Admitting: Pulmonary Disease

## 2019-03-05 ENCOUNTER — Other Ambulatory Visit: Payer: Self-pay

## 2019-03-05 DIAGNOSIS — J439 Emphysema, unspecified: Secondary | ICD-10-CM

## 2019-03-05 LAB — PULMONARY FUNCTION TEST
DL/VA % pred: 60 %
DL/VA: 2.52 ml/min/mmHg/L
DLCO unc % pred: 46 %
DLCO unc: 9.79 ml/min/mmHg
FEF 25-75 Post: 0.72 L/sec
FEF 25-75 Pre: 0.59 L/sec
FEF2575-%Change-Post: 20 %
FEF2575-%Pred-Post: 29 %
FEF2575-%Pred-Pre: 24 %
FEV1-%Change-Post: 7 %
FEV1-%Pred-Post: 53 %
FEV1-%Pred-Pre: 50 %
FEV1-Post: 1.43 L
FEV1-Pre: 1.33 L
FEV1FVC-%Change-Post: 5 %
FEV1FVC-%Pred-Pre: 78 %
FEV6-%Change-Post: 1 %
FEV6-%Pred-Post: 66 %
FEV6-%Pred-Pre: 65 %
FEV6-Post: 2.19 L
FEV6-Pre: 2.15 L
FEV6FVC-%Change-Post: 0 %
FEV6FVC-%Pred-Post: 102 %
FEV6FVC-%Pred-Pre: 102 %
FVC-%Change-Post: 1 %
FVC-%Pred-Post: 64 %
FVC-%Pred-Pre: 63 %
FVC-Post: 2.22 L
FVC-Pre: 2.18 L
Post FEV1/FVC ratio: 64 %
Post FEV6/FVC ratio: 99 %
Pre FEV1/FVC ratio: 61 %
Pre FEV6/FVC Ratio: 99 %
RV % pred: 165 %
RV: 3.37 L
TLC % pred: 130 %
TLC: 6.78 L

## 2019-03-05 NOTE — Progress Notes (Signed)
PFT done today. 

## 2019-03-08 ENCOUNTER — Other Ambulatory Visit: Payer: Self-pay | Admitting: Obstetrics and Gynecology

## 2019-03-10 NOTE — Telephone Encounter (Signed)
Medication refill request: estradiol 1mg  Last AEX:  09-27-17, Last OV 11-11-2018 Next AEX: 09-21-2019 Last MMG (if hormonal medication request): 06-27-18 category b density birads 1:neg Refill authorized: please approve if appropriate.

## 2019-03-11 ENCOUNTER — Other Ambulatory Visit: Payer: Self-pay | Admitting: *Deleted

## 2019-03-11 MED ORDER — ANORO ELLIPTA 62.5-25 MCG/INH IN AEPB: 1.0000 | INHALATION_SPRAY | Freq: Every day | RESPIRATORY_TRACT | 2 refills | Status: DC

## 2019-03-23 ENCOUNTER — Ambulatory Visit: Payer: 59 | Admitting: Pulmonary Disease

## 2019-03-23 ENCOUNTER — Telehealth (HOSPITAL_COMMUNITY): Payer: Self-pay | Admitting: *Deleted

## 2019-03-23 ENCOUNTER — Encounter: Payer: Self-pay | Admitting: Pulmonary Disease

## 2019-03-23 ENCOUNTER — Other Ambulatory Visit: Payer: Self-pay

## 2019-03-23 VITALS — BP 94/62 | HR 74 | Temp 97.4°F | Ht 65.0 in | Wt 108.2 lb

## 2019-03-23 DIAGNOSIS — Z720 Tobacco use: Secondary | ICD-10-CM | POA: Diagnosis not present

## 2019-03-23 DIAGNOSIS — Z23 Encounter for immunization: Secondary | ICD-10-CM

## 2019-03-23 DIAGNOSIS — J439 Emphysema, unspecified: Secondary | ICD-10-CM

## 2019-03-23 NOTE — Progress Notes (Signed)
Emily Ward    [id number]    Jan 04, 1956  Primary Care Physician:Fry, Ishmael Holter, MD  Referring Physician: Laurey Morale, MD [place],   [id number]  Chief complaint: Follow-up for severe COPD  HPI: 63 year old with history of mitral valve prolapse, allergies, chronic headaches Here for follow-up of severe COPD Initially evaluated in December 2019.  Started on Anoro inhaler at that time States that she cannot feel if it is helping.  Still has dyspnea on exertion which is worse on hot and humid days Denies any cough, sputum production, wheezing  Underwent total hysterectomy with salpingectomy on 09/01/2018 under general anesthesia without any respiratory issues.  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 Smoking history: 35-pack-year smoker.  Quit in 2014 Travel history: No significant travel history Relevant family history: Mom had emphysema [name] was a smoker)  Outpatient Encounter Medications as of 03/23/2019  Medication Sig  . butalbital-acetaminophen-caffeine (FIORICET, ESGIC) 50-325-40 MG tablet TAKE 1 TABLET BY MOUTH EVERY 6 HOURS AS NEEDED  . CALCIUM PO Take 1 tablet by mouth daily.  . cetirizine (ZYRTEC) 10 MG tablet Take 10 mg by mouth daily as needed for allergies (spring time).   . chlorzoxazone (PARAFON) 500 MG tablet TAKE 1 TABLET BY MOUTH FOUR TIMES DAILY AS NEEDED FOR MUSCLE SPASMS (Patient taking differently: Take 500 mg by mouth 4 (four) times daily as needed for muscle spasms. )  . cholecalciferol (VITAMIN D) 1000 units tablet Take 2,000 Units by mouth daily.   Marland Kitchen co-enzyme Q-10 30 MG capsule Take 30 mg by mouth daily.  . Cyanocobalamin (VITAMIN B-12 PO) Take 1 tablet by mouth daily.   . cyclobenzaprine (FLEXERIL) 10 MG tablet TAKE 1 TABLET BY MOUTH THREE TIMES DAILY AS NEEDED FOR MUSCLE  SPASMS  . denosumab (PROLIA) 60 MG/ML SOSY injection Inject 60 mg into the skin every 6 (six) months.  . DULoxetine (CYMBALTA) 30 MG capsule TAKE 2 CAPSULES BY MOUTH EVERY MORNING  . estradiol (ESTRACE) 1 MG tablet TAKE 1 TABLET(1 MG) BY MOUTH DAILY  . meloxicam (MOBIC) 7.5 MG tablet TAKE 1 TABLET(7.5 MG) BY MOUTH DAILY (Patient taking differently: Take 7.5 mg by mouth daily as needed for pain. )  . umeclidinium-vilanterol (ANORO ELLIPTA) 62.5-25 MCG/INH AEPB Inhale 1 puff into the lungs daily.  Marland Kitchen zolmitriptan (ZOMIG) 5 MG tablet TAKE 1 TABLET BY MOUTH AS NEEDED  . zolpidem (AMBIEN) 10 MG tablet Take 1 tablet (10 mg total) by mouth at bedtime. (Patient taking differently: Take 10 mg by mouth at bedtime as needed for sleep. )  . terconazole (TERAZOL 7) 0.4 % vaginal cream Place 1 applicator vaginally at bedtime.   No facility-administered encounter medications on file as of 03/23/2019.    Physical Exam: Blood pressure 94/62, pulse 74, temperature (!) 97.4 F (36.3 C), temperature source Oral, height 5\' 5"  (1.651 m), weight 108 lb 3.2 oz (49.1 kg), last menstrual period 09/03/2000, SpO2 95 %. Gen:      No acute distress HEENT:  EOMI, sclera anicteric Neck:     No masses; no thyromegaly Lungs:    Clear to auscultation bilaterally; normal respiratory effort CV:         Regular rate and rhythm; no murmurs Abd:      + bowel sounds; soft, non-tender; no palpable masses, no distension Ext:  No edema; adequate peripheral perfusion Skin:      Warm and dry; no rash Neuro: alert and oriented x 3 Psych: normal mood and affect  Data Reviewed: Imaging: Chest x-ray 04/15/17-hyperinflation, flattened diaphragms.  No acute cardiopulmonary abnormality CT abdomen pelvis 10/18/2007- visualized lung bases are clear. I have reviewed the images personally.  PFTs: 03/05/2019 FVC 2.22 [64%), FEV1 1.43 (53%),F/F 64, TLC 6.78 [130%], DLCO 9.79 [46%] Severe obstruction with air trapping and hyperinflation Severe  diffusion defect  Cardiac: Echo 08/21/17- LVEF 60-65%, normal wall thickness, normal wall motion, normal diastolic function, normal LA size ,trivial MR, normal RV size with low normal RV systolic function, normal IVC.  Labs: CBC 10/22/2018-WBC 6.5, eos 1%, absolute eosinophil count 65 Alpha-1 antitrypsin 08/15/2018-126, PI MM  Assessment:  Severe COPD Continue Anoro inhaler.  Will likely not benefit from inhaled corticosteroid as peripherally eosinophils are low.  She would like to avoid additional medications as well  Did not desat on exertion.  Referral for pulmonary rehab  Ex-smoker Start low-dose screening CTs of the chest.  Health maintenance Give Pneumovax  Plan/Recommendations: - Continue Anoro - Pulm rehab, screening CTs of the chest - Pneumovax  Marshell Garfinkel MD [facility] 03/23/2019, 12:22 PM  CC: Laurey Morale, MD

## 2019-03-23 NOTE — Patient Instructions (Signed)
Continue the Anoro inhaler We will refer you for pulmonary rehab  We will refer you for low-dose screening CT of the chest Follow-up in 6 months.

## 2019-03-23 NOTE — Telephone Encounter (Signed)
Received referral for this pt to participate in pulmonary rehab by Dr. Vaughan Browner with the diagnosis of Pulmonary Emphysema. Called and left message for pt. To advise of our scheduling for new referrals and will check insurance benefits/eligibility.  Will send pt packet of information she can use during this interim. Requested call back. Cherre Huger, BSN Cardiac and Training and development officer

## 2019-03-23 NOTE — Addendum Note (Signed)
Addended by: Hildred Alamin I on: 03/23/2019 02:40 PM   Modules accepted: Orders

## 2019-03-24 ENCOUNTER — Telehealth (HOSPITAL_COMMUNITY): Payer: Self-pay

## 2019-04-02 ENCOUNTER — Telehealth (HOSPITAL_COMMUNITY): Payer: Self-pay

## 2019-04-02 NOTE — Telephone Encounter (Signed)
Pt insurance is active and benefits verified through [place] $25, DED $500/0 met, out of pocket $5,500/$1,263.29 met, co-insurance 0. no pre-authorization required, REF# [id number]

## 2019-04-03 ENCOUNTER — Telehealth (HOSPITAL_COMMUNITY): Payer: Self-pay | Admitting: Family Medicine

## 2019-04-03 ENCOUNTER — Telehealth (HOSPITAL_COMMUNITY): Payer: Self-pay

## 2019-04-06 ENCOUNTER — Other Ambulatory Visit (HOSPITAL_COMMUNITY): Payer: Self-pay | Admitting: Family Medicine

## 2019-04-07 NOTE — Telephone Encounter (Signed)
Last filled 08/26/18 Last OV 07/18/18  Ok to fill?

## 2019-04-07 NOTE — Telephone Encounter (Signed)
Last filled 09/02/2018 Last OV 07/18/2018  Ok to fill?

## 2019-04-20 ENCOUNTER — Other Ambulatory Visit: Payer: Self-pay

## 2019-04-20 ENCOUNTER — Encounter (HOSPITAL_COMMUNITY)
Admission: RE | Admit: 2019-04-20 | Discharge: 2019-04-20 | Disposition: A | Discharge status: Home / Self Care | Payer: 59 | Source: Ambulatory Visit | Attending: Pulmonary Disease | Admitting: Pulmonary Disease

## 2019-04-20 VITALS — BP 102/62 | HR 77 | Ht 65.0 in | Wt 110.5 lb

## 2019-04-20 DIAGNOSIS — J439 Emphysema, unspecified: Secondary | ICD-10-CM

## 2019-04-20 NOTE — Progress Notes (Signed)
Emily Ward 63 y.o. female Pulmonary Rehab Orientation Note Patient arrived today in Cardiac and Pulmonary Rehab for orientation to Pulmonary Rehab. She walked from the [employer] parking deck without difficulty. She does not carry portable oxygen. Per pt, she uses oxygen never. Color good, skin warm and dry. Patient is oriented to time and place. Patient's medical history, psychosocial health, and medications reviewed. Psychosocial assessment reveals pt lives with their spouse. Pt is currently working part time as a Marine scientist at the [employer]. Pt hobbies include knitting, crocheting, weaving. Pt reports her stress level is low. Areas of stress/anxiety include Health.  Pt does not exhibit  signs of depression. She does have problems falling asleep, but attributes that to her fibromyalgia, not depression.PHQ2/9 score 0/7. Pt shows good  coping skills with positive outlook .  Will continue to monitor psychosocial wellbeing for changes while in the program. Physical assessment reveals heart rate is normal, breath sounds clear to auscultation, no wheezes, rales, or rhonchi. Grip strength equal, strong. Distal pulses 3+ bilateral posterior tibial pulses present without peripheral edema. Patient reports she does take medications as prescribed. Patient states she follows a Regular diet. The patient reports no specific efforts to gain or lose weight.. Patient's weight will be monitored closely. Demonstration and practice of PLB using pulse oximeter. Patient able to return demonstration satisfactorily. Safety and hand hygiene in the exercise area reviewed with patient. Patient voices understanding of the information reviewed. Department expectations discussed with patient and achievable goals were set. The patient shows enthusiasm about attending the program and we look forward to working with this nice lady. The patient is completed a 6 min walk test today and will begin begin exercise on  Tuesday, April 28, 2019 in the 1345 class.  [id number]

## 2019-04-20 NOTE — Progress Notes (Signed)
Pulmonary Individual Treatment Plan  Patient Details  Name: Emily Ward MRN: [id number] Date of Birth: 07-Dec-1955 Referring Provider:     Pulmonary Rehab Walk Test from 04/20/2019 in [facility]  Referring Provider  Dr. Vaughan Browner      Initial Encounter Date:    Pulmonary Rehab Walk Test from 04/20/2019 in [facility]  Date  04/20/19      Visit Diagnosis: Pulmonary emphysema, unspecified emphysema type ([place])  Patient's Home Medications on Admission:   Current Outpatient Medications:  .  butalbital-acetaminophen-caffeine (FIORICET) 50-325-40 MG tablet, TAKE 1 TABLET BY MOUTH EVERY 6 HOURS AS NEEDED, Disp: 120 tablet, Rfl: 5 .  CALCIUM PO, Take 1 tablet by mouth daily., Disp: , Rfl:  .  cetirizine (ZYRTEC) 10 MG tablet, Take 10 mg by mouth daily as needed for allergies (spring time). , Disp: , Rfl:  .  chlorzoxazone (PARAFON) 500 MG tablet, TAKE 1 TABLET BY MOUTH FOUR TIMES DAILY AS NEEDED FOR MUSCLE SPASMS (Patient taking differently: Take 500 mg by mouth 4 (four) times daily as needed for muscle spasms. ), Disp: 120 tablet, Rfl: 3 .  cholecalciferol (VITAMIN D) 1000 units tablet, Take 2,000 Units by mouth daily. , Disp: , Rfl:  .  Cyanocobalamin (VITAMIN B-12 PO), Take 1 tablet by mouth daily. , Disp: , Rfl:  .  cyclobenzaprine (FLEXERIL) 10 MG tablet, TAKE 1 TABLET BY MOUTH THREE TIMES DAILY AS NEEDED FOR MUSCLE SPASMS, Disp: 270 tablet, Rfl: 1 .  denosumab (PROLIA) 60 MG/ML SOSY injection, Inject 60 mg into the skin every 6 (six) months., Disp: , Rfl:  .  DULoxetine (CYMBALTA) 30 MG capsule, TAKE 2 CAPSULES BY MOUTH EVERY MORNING, Disp: 180 capsule, Rfl: 0 .  estradiol (ESTRACE) 1 MG tablet, TAKE 1 TABLET(1 MG) BY MOUTH DAILY, Disp: 90 tablet, Rfl: 2 .  meloxicam (MOBIC) 7.5 MG tablet, TAKE 1 TABLET(7.5 MG) BY MOUTH DAILY (Patient taking differently: Take 7.5 mg by mouth daily as needed for pain. ), Disp: 90 tablet,  Rfl: 3 .  umeclidinium-vilanterol (ANORO ELLIPTA) 62.5-25 MCG/INH AEPB, Inhale 1 puff into the lungs daily., Disp: 60 each, Rfl: 2 .  zolmitriptan (ZOMIG) 5 MG tablet, TAKE 1 TABLET BY MOUTH AS NEEDED, Disp: 30 tablet, Rfl: 5 .  zolpidem (AMBIEN) 10 MG tablet, TAKE 1 TABLET BY MOUTH EVERY NIGHT AT BEDTIME, Disp: 90 tablet, Rfl: 1 .  co-enzyme Q-10 30 MG capsule, Take 30 mg by mouth daily., Disp: , Rfl:  .  terconazole (TERAZOL 7) 0.4 % vaginal cream, Place 1 applicator vaginally at bedtime., Disp: 45 g, Rfl: 0  Past Medical History: Past Medical History:  Diagnosis Date  . Cervical disc disease 2006  . Emphysema lung ([place])   . 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    . Fibromyalgia    sees Dr. Estanislado Pandy   . Heart murmur   . 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 Hyperlipidemia   . Migraines   . MVP (mitral valve prolapse)    no daily medication  . Peptic ulcer    no problems    Tobacco Use: Social History   Tobacco Use  Smoking Status Former Smoker  . Packs/day: 1.00  . Years: 20.00  . Pack years: 20.00  . Types: Cigarettes  . Quit date: 04/02/2013  . Years since quitting: 6.0  Smokeless Tobacco Never Used    Labs: Recent Review  Flowsheet Data    Labs for ITP Cardiac and Pulmonary Rehab Latest Ref Rng & Units 04/09/2014 04/21/2015 05/11/2016 07/12/2017 07/18/2018   Cholestrol 0 - 200 mg/dL 182 201(H) 218(H) 192 197   LDLCALC 0 - 99 mg/dL 104(H) 113(H) 130(H) 112(H) 114(H)   LDLDIRECT mg/dL - - - - -   HDL >39.00 mg/dL 51.60 66.20 74.40 65.70 72.10   Trlycerides 0.0 - 149.0 mg/dL 130.0 107.0 70.0 76.0 53.0      Capillary Blood Glucose: Lab Results  Component Value Date   GLUCAP 85 10/22/2018   GLUCAP 165 (H) 09/01/2018     Pulmonary Assessment Scores: Pulmonary Assessment Scores    Row Name 04/20/19 1132         ADL UCSD   ADL Phase  Entry     SOB Score total  32       CAT Score   CAT Score  pre 14        UCSD: Self-administered rating of dyspnea associated with activities of daily living (ADLs) 6-point scale (0 = "not at all" to 5 = "maximal or unable to do because of breathlessness")  Scoring Scores range from 0 to 120.  Minimally important difference is 5 units  CAT: CAT can identify the health impairment of COPD patients and is better correlated with disease progression.  CAT has a scoring range of zero to 40. The CAT score is classified into four groups of low (less than 10), medium (10 - 20), high (21-30) and very high (31-40) based on the impact level of disease on health status. A CAT score over 10 suggests significant symptoms.  A worsening CAT score could be explained by an exacerbation, poor medication adherence, poor inhaler technique, or progression of COPD or comorbid conditions.  CAT MCID is 2 points  mMRC: mMRC (Modified Medical Research Council) Dyspnea Scale is used to assess the degree of baseline functional disability in patients of respiratory disease due to dyspnea. No minimal important difference is established. A decrease in score of 1 point or greater is considered a positive change.   Pulmonary Function Assessment: Pulmonary Function Assessment - 04/20/19 1132      Breath   Bilateral Breath Sounds  Clear    Shortness of Breath  Limiting activity;Fear of Shortness of Breath       Exercise Target Goals: Exercise Program Goal: Individual exercise prescription set using results from initial 6 min walk test and THRR while considering  patient's activity barriers and safety.   Exercise Prescription Goal: Initial exercise prescription builds to 30-45 minutes a day of aerobic activity, 2-3 days per week.  Home exercise guidelines will be given to patient during program as part of exercise prescription that the participant will acknowledge.  Activity Barriers & Risk Stratification: Activity Barriers & Cardiac Risk Stratification - 04/20/19 1117      Activity  Barriers & Cardiac Risk Stratification   Activity Barriers  Fibromyalgia       6 Minute Walk: 6 Minute Walk    Row Name 04/20/19 1154         6 Minute Walk   Phase  Initial     Distance  1674 feet     Walk Time  6 minutes     # of Rest Breaks  0     MPH  3.17     METS  3.45     RPE  12     Perceived Dyspnea   2     VO2  Peak  15.33     Symptoms  No     Resting HR  81 bpm     Resting BP  118/64     Resting Oxygen Saturation   99 %     Exercise Oxygen Saturation  during 6 min walk  89 %     Max Ex. HR  105 bpm     Max Ex. BP  124/62     2 Minute Post BP  112/68       Interval HR   1 Minute HR  87     2 Minute HR  94     3 Minute HR  98     4 Minute HR  99     5 Minute HR  106     6 Minute HR  105     2 Minute Post HR  76     Interval Heart Rate?  Yes       Interval Oxygen   Interval Oxygen?  Yes     Baseline Oxygen Saturation %  99 %     1 Minute Oxygen Saturation %  94 %     1 Minute Liters of Oxygen  0 L     2 Minute Oxygen Saturation %  92 %     2 Minute Liters of Oxygen  0 L     3 Minute Oxygen Saturation %  91 %     3 Minute Liters of Oxygen  0 L     4 Minute Oxygen Saturation %  92 %     4 Minute Liters of Oxygen  0 L     5 Minute Oxygen Saturation %  89 %     5 Minute Liters of Oxygen  0 L     6 Minute Oxygen Saturation %  89 %     6 Minute Liters of Oxygen  0 L     2 Minute Post Oxygen Saturation %  97 %     2 Minute Post Liters of Oxygen  0 L        Oxygen Initial Assessment: Oxygen Initial Assessment - 04/20/19 1154      Home Oxygen   Home Oxygen Device  None    Sleep Oxygen Prescription  None    Home Exercise Oxygen Prescription  None    Home at Rest Exercise Oxygen Prescription  None      Initial 6 min Walk   Oxygen Used  None      Program Oxygen Prescription   Program Oxygen Prescription  None      Intervention   Short Term Goals  To learn and exhibit compliance with exercise, home and travel O2 prescription;To learn and understand  importance of monitoring SPO2 with pulse oximeter and demonstrate accurate use of the pulse oximeter.;To learn and understand importance of maintaining oxygen saturations>88%;To learn and demonstrate proper pursed lip breathing techniques or other breathing techniques.;To learn and demonstrate proper use of respiratory medications    Long  Term Goals  Compliance with respiratory medication;Exhibits proper breathing techniques, such as pursed lip breathing or other method taught during program session;Maintenance of O2 saturations>88%;Verbalizes importance of monitoring SPO2 with pulse oximeter and return demonstration;Exhibits compliance with exercise, home and travel O2 prescription       Oxygen Re-Evaluation:   Oxygen Discharge (Final Oxygen Re-Evaluation):   Initial Exercise Prescription: Initial Exercise Prescription - 04/20/19 1200      Date of Initial Exercise RX and  Referring Provider   Date  04/20/19    Referring Provider  Dr. Vaughan Browner      Bike   Level  2    Minutes  15      NuStep   Level  3    SPM  80    METs  15      Prescription Details   Frequency (times per week)  2    Duration  Progress to 30 minutes of continuous aerobic without signs/symptoms of physical distress      Intensity   THRR 40-80% of Max Heartrate  64-128    Ratings of Perceived Exertion  11-13    Perceived Dyspnea  0-4      Progression   Progression  Continue to progress workloads to maintain intensity without signs/symptoms of physical distress.      Resistance Training   Training Prescription  Yes    Weight  orange bands    Reps  10-15       Perform Capillary Blood Glucose checks as needed.  Exercise Prescription Changes:   Exercise Comments:   Exercise Goals and Review:   Exercise Goals Re-Evaluation :   Discharge Exercise Prescription (Final Exercise Prescription Changes):   Nutrition:  Target Goals: Understanding of nutrition guidelines, daily intake of sodium 1500mg ,  cholesterol 200mg , calories 30% from fat and 7% or less from saturated fats, daily to have 5 or more servings of fruits and vegetables.  Biometrics: Pre Biometrics - 04/20/19 1118      Pre Biometrics   Height  5\' 5"  (1.651 m)    Weight  50.1 kg    BMI (Calculated)  18.38    Grip Strength  15 kg        Nutrition Therapy Plan and Nutrition Goals:   Nutrition Assessments:   Nutrition Goals Re-Evaluation:   Nutrition Goals Discharge (Final Nutrition Goals Re-Evaluation):   Psychosocial: Target Goals: Acknowledge presence or absence of significant depression and/or stress, maximize coping skills, provide positive support system. Participant is able to verbalize types and ability to use techniques and skills needed for reducing stress and depression.  Initial Review & Psychosocial Screening: Initial Psych Review & Screening - 04/20/19 1134      Initial Review   Current issues with  Current Sleep Concerns      Family Dynamics   Good Support System?  Yes      Barriers   Psychosocial barriers to participate in program  There are no identifiable barriers or psychosocial needs.      Screening Interventions   Interventions  Encouraged to exercise    Expected Outcomes  Long Term Goal: Stressors or current issues are controlled or eliminated.;Long Term goal: The participant improves quality of Life and PHQ9 Scores as seen by post scores and/or verbalization of changes       Quality of Life Scores:  Scores of 19 and below usually indicate a poorer quality of life in these areas.  A difference of  2-3 points is a clinically meaningful difference.  A difference of 2-3 points in the total score of the Quality of Life Index has been associated with significant improvement in overall quality of life, self-image, physical symptoms, and general health in studies assessing change in quality of life.   PHQ-9: Recent Review Flowsheet Data    Depression screen [facility] 2/9 04/20/2019  07/18/2018   Decreased Interest 0 0   Down, Depressed, Hopeless 0 -   PHQ - 2 Score 0 0  Altered sleeping 3 -   Tired, decreased energy 1 -   Change in appetite 0 -   Feeling bad or failure about yourself  0 -   Trouble concentrating 3 -   Moving slowly or fidgety/restless 0 -   Suicidal thoughts 0 -   PHQ-9 Score 7 -   Difficult doing work/chores Not difficult at all -     Interpretation of Total Score  Total Score Depression Severity:  1-4 = Minimal depression, 5-9 = Mild depression, 10-14 = Moderate depression, 15-19 = Moderately severe depression, 20-27 = Severe depression   Psychosocial Evaluation and Intervention: Psychosocial Evaluation - 04/20/19 1135      Psychosocial Evaluation & Interventions   Interventions  Relaxation education;Encouraged to exercise with the program and follow exercise prescription    Comments  in hopes exercise will improve sleeping habits and improve quality of life.    Expected Outcomes  No barriers to participation in pulmonary rehab    Continue Psychosocial Services   Follow up required by staff       Psychosocial Re-Evaluation: Psychosocial Re-Evaluation    [place] Name 04/20/19 1137             Psychosocial Re-Evaluation   Current issues with  Current Sleep Concerns          Psychosocial Discharge (Final Psychosocial Re-Evaluation): Psychosocial Re-Evaluation - 04/20/19 1137      Psychosocial Re-Evaluation   Current issues with  Current Sleep Concerns        Education: Education Goals: Education classes will be provided on a weekly basis, covering required topics. Participant will state understanding/return demonstration of topics presented.  Learning Barriers/Preferences: Learning Barriers/Preferences - 04/20/19 1138      Learning Barriers/Preferences   Learning Barriers  None    Learning Preferences  Audio;Computer/Internet;Group Instruction;Individual Instruction;Pictoral;Skilled Demonstration;Verbal  Instruction;Video;Written Material       Education Topics: How Lungs Work and Diseases: - Discuss the anatomy of the lungs and diseases that can affect the lungs, such as COPD.   Exercise: -Discuss the importance of exercise, FITT principles of exercise, normal and abnormal responses to exercise, and how to exercise safely.   Environmental Irritants: -Discuss types of environmental irritants and how to limit exposure to environmental irritants.   Meds/Inhalers and oxygen: - Discuss respiratory medications, definition of an inhaler and oxygen, and the proper way to use an inhaler and oxygen.   Energy Saving Techniques: - Discuss methods to conserve energy and decrease shortness of breath when performing activities of daily living.    Bronchial Hygiene / Breathing Techniques: - Discuss breathing mechanics, pursed-lip breathing technique,  proper posture, effective ways to clear airways, and other functional breathing techniques   Cleaning Equipment: - Provides group verbal and written instruction about the health risks of elevated stress, cause of high stress, and healthy ways to reduce stress.   Nutrition I: Fats: - Discuss the types of cholesterol, what cholesterol does to the body, and how cholesterol levels can be controlled.   Nutrition II: Labels: -Discuss the different components of food labels and how to read food labels.   Respiratory Infections: - Discuss the signs and symptoms of respiratory infections, ways to prevent respiratory infections, and the importance of seeking medical treatment when having a respiratory infection.   Stress I: Signs and Symptoms: - Discuss the causes of stress, how stress may lead to anxiety and depression, and ways to limit stress.   Stress II: Relaxation: -Discuss relaxation techniques to limit stress.  Oxygen for Home/Travel: - Discuss how to prepare for travel when on oxygen and proper ways to transport and store oxygen  to ensure safety.   Knowledge Questionnaire Score: Knowledge Questionnaire Score - 04/20/19 1134      Knowledge Questionnaire Score   Pre Score  16/18  pre       Core Components/Risk Factors/Patient Goals at Admission: Personal Goals and Risk Factors at Admission - 04/20/19 1138      Core Components/Risk Factors/Patient Goals on Admission   Improve shortness of breath with ADL's  Yes    Intervention  Provide education, individualized exercise plan and daily activity instruction to help decrease symptoms of SOB with activities of daily living.    Expected Outcomes  Short Term: Improve cardiorespiratory fitness to achieve a reduction of symptoms when performing ADLs;Long Term: Be able to perform more ADLs without symptoms or delay the onset of symptoms       Core Components/Risk Factors/Patient Goals Review:  Goals and Risk Factor Review    Row Name 04/20/19 1138             Core Components/Risk Factors/Patient Goals Review   Personal Goals Review  Increase knowledge of respiratory medications and ability to use respiratory devices properly.;Improve shortness of breath with ADL's;Develop more efficient breathing techniques such as purse lipped breathing and diaphragmatic breathing and practicing self-pacing with activity.          Core Components/Risk Factors/Patient Goals at Discharge (Final Review):  Goals and Risk Factor Review - 04/20/19 1138      Core Components/Risk Factors/Patient Goals Review   Personal Goals Review  Increase knowledge of respiratory medications and ability to use respiratory devices properly.;Improve shortness of breath with ADL's;Develop more efficient breathing techniques such as purse lipped breathing and diaphragmatic breathing and practicing self-pacing with activity.       ITP Comments:   Comments: Will monitor saturations via forehead probe. Will begin doing seated exercise to monitor saturations before moving to treadmill.

## 2019-04-27 ENCOUNTER — Other Ambulatory Visit: Payer: Self-pay

## 2019-04-27 ENCOUNTER — Ambulatory Visit (INDEPENDENT_AMBULATORY_CARE_PROVIDER_SITE_OTHER)
Admission: RE | Admit: 2019-04-27 | Discharge: 2019-04-27 | Disposition: A | Discharge status: Home / Self Care | Payer: 59 | Source: Ambulatory Visit | Attending: Pulmonary Disease | Admitting: Pulmonary Disease

## 2019-04-27 DIAGNOSIS — Z87891 Personal history of nicotine dependence: Secondary | ICD-10-CM | POA: Diagnosis not present

## 2019-04-27 DIAGNOSIS — Z720 Tobacco use: Secondary | ICD-10-CM

## 2019-04-28 ENCOUNTER — Ambulatory Visit (HOSPITAL_COMMUNITY): Payer: 59

## 2019-04-28 ENCOUNTER — Encounter (HOSPITAL_COMMUNITY)
Admission: RE | Admit: 2019-04-28 | Discharge: 2019-04-28 | Disposition: A | Discharge status: Home / Self Care | Payer: 59 | Source: Ambulatory Visit | Attending: Pulmonary Disease | Admitting: Pulmonary Disease

## 2019-04-28 ENCOUNTER — Other Ambulatory Visit: Payer: Self-pay

## 2019-04-28 VITALS — Wt 112.0 lb

## 2019-04-28 DIAGNOSIS — J439 Emphysema, unspecified: Secondary | ICD-10-CM | POA: Diagnosis not present

## 2019-04-28 NOTE — Progress Notes (Signed)
Pulmonary Individual Treatment Plan  Patient Details  Name: Emily Ward MRN: [id number] Date of Birth: 1956/01/31 Referring Provider:     Pulmonary Rehab Walk Test from 04/20/2019 in [place]  Referring Provider  Dr. Vaughan Browner      Initial Encounter Date:    Pulmonary Rehab Walk Test from 04/20/2019 in [place]  Date  04/20/19      Visit Diagnosis: Pulmonary emphysema, unspecified emphysema type ([place])  Patient's Home Medications on Admission:   Current Outpatient Medications:  .  butalbital-acetaminophen-caffeine (FIORICET) 50-325-40 MG tablet, TAKE 1 TABLET BY MOUTH EVERY 6 HOURS AS NEEDED, Disp: 120 tablet, Rfl: 5 .  CALCIUM PO, Take 1 tablet by mouth daily., Disp: , Rfl:  .  cetirizine (ZYRTEC) 10 MG tablet, Take 10 mg by mouth daily as needed for allergies (spring time). , Disp: , Rfl:  .  chlorzoxazone (PARAFON) 500 MG tablet, TAKE 1 TABLET BY MOUTH FOUR TIMES DAILY AS NEEDED FOR MUSCLE SPASMS (Patient taking differently: Take 500 mg by mouth 4 (four) times daily as needed for muscle spasms. ), Disp: 120 tablet, Rfl: 3 .  cholecalciferol (VITAMIN D) 1000 units tablet, Take 2,000 Units by mouth daily. , Disp: , Rfl:  .  co-enzyme Q-10 30 MG capsule, Take 30 mg by mouth daily., Disp: , Rfl:  .  Cyanocobalamin (VITAMIN B-12 PO), Take 1 tablet by mouth daily. , Disp: , Rfl:  .  cyclobenzaprine (FLEXERIL) 10 MG tablet, TAKE 1 TABLET BY MOUTH THREE TIMES DAILY AS NEEDED FOR MUSCLE SPASMS, Disp: 270 tablet, Rfl: 1 .  denosumab (PROLIA) 60 MG/ML SOSY injection, Inject 60 mg into the skin every 6 (six) months., Disp: , Rfl:  .  DULoxetine (CYMBALTA) 30 MG capsule, TAKE 2 CAPSULES BY MOUTH EVERY MORNING, Disp: 180 capsule, Rfl: 0 .  estradiol (ESTRACE) 1 MG tablet, TAKE 1 TABLET(1 MG) BY MOUTH DAILY, Disp: 90 tablet, Rfl: 2 .  meloxicam (MOBIC) 7.5 MG tablet, TAKE 1 TABLET(7.5 MG) BY MOUTH DAILY (Patient taking  differently: Take 7.5 mg by mouth daily as needed for pain. ), Disp: 90 tablet, Rfl: 3 .  terconazole (TERAZOL 7) 0.4 % vaginal cream, Place 1 applicator vaginally at bedtime., Disp: 45 g, Rfl: 0 .  umeclidinium-vilanterol (ANORO ELLIPTA) 62.5-25 MCG/INH AEPB, Inhale 1 puff into the lungs daily., Disp: 60 each, Rfl: 2 .  zolmitriptan (ZOMIG) 5 MG tablet, TAKE 1 TABLET BY MOUTH AS NEEDED, Disp: 30 tablet, Rfl: 5 .  zolpidem (AMBIEN) 10 MG tablet, TAKE 1 TABLET BY MOUTH EVERY NIGHT AT BEDTIME, Disp: 90 tablet, Rfl: 1  Past Medical History: Past Medical History:  Diagnosis Date  . Cervical disc disease 2006  . Emphysema lung ([place])   . 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    . Fibromyalgia    sees Dr. Estanislado Pandy   . Heart murmur   . 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 Hyperlipidemia   . Migraines   . MVP (mitral valve prolapse)    no daily medication  . Peptic ulcer    no problems    Tobacco Use: Social History   Tobacco Use  Smoking Status Former Smoker  . Packs/day: 1.00  . Years: 20.00  . Pack years: 20.00  . Types: Cigarettes  . Quit date: 04/02/2013  . Years since quitting: 6.0  Smokeless Tobacco Never Used    Labs: Recent Review  Flowsheet Data    Labs for ITP Cardiac and Pulmonary Rehab Latest Ref Rng & Units 04/09/2014 04/21/2015 05/11/2016 07/12/2017 07/18/2018   Cholestrol 0 - 200 mg/dL 182 201(H) 218(H) 192 197   LDLCALC 0 - 99 mg/dL 104(H) 113(H) 130(H) 112(H) 114(H)   LDLDIRECT mg/dL - - - - -   HDL >39.00 mg/dL 51.60 66.20 74.40 65.70 72.10   Trlycerides 0.0 - 149.0 mg/dL 130.0 107.0 70.0 76.0 53.0      Capillary Blood Glucose: Lab Results  Component Value Date   GLUCAP 85 10/22/2018   GLUCAP 165 (H) 09/01/2018     Pulmonary Assessment Scores: Pulmonary Assessment Scores    Row Name 04/20/19 1132 04/20/19 1212       ADL UCSD   ADL Phase  Entry  Entry    SOB Score total  32  -      CAT Score   CAT  Score  pre 14  -      mMRC Score   mMRC Score  -  2      UCSD: Self-administered rating of dyspnea associated with activities of daily living (ADLs) 6-point scale (0 = "not at all" to 5 = "maximal or unable to do because of breathlessness")  Scoring Scores range from 0 to 120.  Minimally important difference is 5 units  CAT: CAT can identify the health impairment of COPD patients and is better correlated with disease progression.  CAT has a scoring range of zero to 40. The CAT score is classified into four groups of low (less than 10), medium (10 - 20), high (21-30) and very high (31-40) based on the impact level of disease on health status. A CAT score over 10 suggests significant symptoms.  A worsening CAT score could be explained by an exacerbation, poor medication adherence, poor inhaler technique, or progression of COPD or comorbid conditions.  CAT MCID is 2 points  mMRC: mMRC (Modified Medical Research Council) Dyspnea Scale is used to assess the degree of baseline functional disability in patients of respiratory disease due to dyspnea. No minimal important difference is established. A decrease in score of 1 point or greater is considered a positive change.   Pulmonary Function Assessment: Pulmonary Function Assessment - 04/20/19 1132      Breath   Bilateral Breath Sounds  Clear    Shortness of Breath  Limiting activity;Fear of Shortness of Breath       Exercise Target Goals: Exercise Program Goal: Individual exercise prescription set using results from initial 6 min walk test and THRR while considering  patient's activity barriers and safety.   Exercise Prescription Goal: Initial exercise prescription builds to 30-45 minutes a day of aerobic activity, 2-3 days per week.  Home exercise guidelines will be given to patient during program as part of exercise prescription that the participant will acknowledge.  Activity Barriers & Risk Stratification: Activity Barriers &  Cardiac Risk Stratification - 04/20/19 1117      Activity Barriers & Cardiac Risk Stratification   Activity Barriers  Fibromyalgia       6 Minute Walk: 6 Minute Walk    Row Name 04/20/19 1154         6 Minute Walk   Phase  Initial     Distance  1674 feet     Walk Time  6 minutes     # of Rest Breaks  0     MPH  3.17     METS  3.45  RPE  12     Perceived Dyspnea   2     VO2 Peak  15.33     Symptoms  No     Resting HR  81 bpm     Resting BP  118/64     Resting Oxygen Saturation   99 %     Exercise Oxygen Saturation  during 6 min walk  89 %     Max Ex. HR  105 bpm     Max Ex. BP  124/62     2 Minute Post BP  112/68       Interval HR   1 Minute HR  87     2 Minute HR  94     3 Minute HR  98     4 Minute HR  99     5 Minute HR  106     6 Minute HR  105     2 Minute Post HR  76     Interval Heart Rate?  Yes       Interval Oxygen   Interval Oxygen?  Yes     Baseline Oxygen Saturation %  99 %     1 Minute Oxygen Saturation %  94 %     1 Minute Liters of Oxygen  0 L     2 Minute Oxygen Saturation %  92 %     2 Minute Liters of Oxygen  0 L     3 Minute Oxygen Saturation %  91 %     3 Minute Liters of Oxygen  0 L     4 Minute Oxygen Saturation %  92 %     4 Minute Liters of Oxygen  0 L     5 Minute Oxygen Saturation %  89 %     5 Minute Liters of Oxygen  0 L     6 Minute Oxygen Saturation %  89 %     6 Minute Liters of Oxygen  0 L     2 Minute Post Oxygen Saturation %  97 %     2 Minute Post Liters of Oxygen  0 L        Oxygen Initial Assessment: Oxygen Initial Assessment - 04/20/19 1154      Home Oxygen   Home Oxygen Device  None    Sleep Oxygen Prescription  None    Home Exercise Oxygen Prescription  None    Home at Rest Exercise Oxygen Prescription  None      Initial 6 min Walk   Oxygen Used  None      Program Oxygen Prescription   Program Oxygen Prescription  None      Intervention   Short Term Goals  To learn and exhibit compliance with  exercise, home and travel O2 prescription;To learn and understand importance of monitoring SPO2 with pulse oximeter and demonstrate accurate use of the pulse oximeter.;To learn and understand importance of maintaining oxygen saturations>88%;To learn and demonstrate proper pursed lip breathing techniques or other breathing techniques.;To learn and demonstrate proper use of respiratory medications    Long  Term Goals  Compliance with respiratory medication;Exhibits proper breathing techniques, such as pursed lip breathing or other method taught during program session;Maintenance of O2 saturations>88%;Verbalizes importance of monitoring SPO2 with pulse oximeter and return demonstration;Exhibits compliance with exercise, home and travel O2 prescription       Oxygen Re-Evaluation: Oxygen Re-Evaluation    Row Name 04/28/19 1500  Program Oxygen Prescription   Program Oxygen Prescription  None         Home Oxygen   Home Oxygen Device  None       Sleep Oxygen Prescription  None       Home Exercise Oxygen Prescription  None       Home at Rest Exercise Oxygen Prescription  None         Goals/Expected Outcomes   Short Term Goals  To learn and exhibit compliance with exercise, home and travel O2 prescription;To learn and understand importance of monitoring SPO2 with pulse oximeter and demonstrate accurate use of the pulse oximeter.;To learn and understand importance of maintaining oxygen saturations>88%;To learn and demonstrate proper pursed lip breathing techniques or other breathing techniques.;To learn and demonstrate proper use of respiratory medications       Long  Term Goals  Exhibits compliance with exercise, home and travel O2 prescription;Verbalizes importance of monitoring SPO2 with pulse oximeter and return demonstration;Maintenance of O2 saturations>88%;Exhibits proper breathing techniques, such as pursed lip breathing or other method taught during program session;Compliance  with respiratory medication       Goals/Expected Outcomes  compliance          Oxygen Discharge (Final Oxygen Re-Evaluation): Oxygen Re-Evaluation - 04/28/19 1500      Program Oxygen Prescription   Program Oxygen Prescription  None      Home Oxygen   Home Oxygen Device  None    Sleep Oxygen Prescription  None    Home Exercise Oxygen Prescription  None    Home at Rest Exercise Oxygen Prescription  None      Goals/Expected Outcomes   Short Term Goals  To learn and exhibit compliance with exercise, home and travel O2 prescription;To learn and understand importance of monitoring SPO2 with pulse oximeter and demonstrate accurate use of the pulse oximeter.;To learn and understand importance of maintaining oxygen saturations>88%;To learn and demonstrate proper pursed lip breathing techniques or other breathing techniques.;To learn and demonstrate proper use of respiratory medications    Long  Term Goals  Exhibits compliance with exercise, home and travel O2 prescription;Verbalizes importance of monitoring SPO2 with pulse oximeter and return demonstration;Maintenance of O2 saturations>88%;Exhibits proper breathing techniques, such as pursed lip breathing or other method taught during program session;Compliance with respiratory medication    Goals/Expected Outcomes  compliance       Initial Exercise Prescription: Initial Exercise Prescription - 04/20/19 1200      Date of Initial Exercise RX and Referring Provider   Date  04/20/19    Referring Provider  Dr. Vaughan Browner      Bike   Level  2    Minutes  15      NuStep   Level  3    SPM  80    METs  15      Prescription Details   Frequency (times per week)  2    Duration  Progress to 30 minutes of continuous aerobic without signs/symptoms of physical distress      Intensity   THRR 40-80% of Max Heartrate  64-128    Ratings of Perceived Exertion  11-13    Perceived Dyspnea  0-4      Progression   Progression  Continue to progress  workloads to maintain intensity without signs/symptoms of physical distress.      Resistance Training   Training Prescription  Yes    Weight  orange bands    Reps  10-15  Perform Capillary Blood Glucose checks as needed.  Exercise Prescription Changes: Exercise Prescription Changes    Row Name 04/28/19 1500             Response to Exercise   Blood Pressure (Admit)  110/70       Blood Pressure (Exercise)  130/80       Blood Pressure (Exit)  112/64       Heart Rate (Admit)  89 bpm       Heart Rate (Exercise)  116 bpm       Heart Rate (Exit)  85 bpm       Oxygen Saturation (Admit)  100 %       Oxygen Saturation (Exercise)  95 %       Oxygen Saturation (Exit)  99 %       Rating of Perceived Exertion (Exercise)  11       Perceived Dyspnea (Exercise)  1       Duration  Continue with 30 min of aerobic exercise without signs/symptoms of physical distress.       Intensity  THRR unchanged         Progression   Progression  Continue to progress workloads to maintain intensity without signs/symptoms of physical distress.         Resistance Training   Training Prescription  Yes       Weight  orange bands       Reps  10-15       Time  10 Minutes         Bike   Level  2       Minutes  15         NuStep   Level  3       SPM  80       METs  15          Exercise Comments:   Exercise Goals and Review:   Exercise Goals Re-Evaluation : Exercise Goals Re-Evaluation    Row Name 04/28/19 1504             Exercise Goal Re-Evaluation   Exercise Goals Review  Increase Physical Activity;Increase Strength and Stamina;Able to understand and use rate of perceived exertion (RPE) scale;Able to understand and use Dyspnea scale;Knowledge and understanding of Target Heart Rate Range (THRR);Understanding of Exercise Prescription       Comments  Pt just completed her first exercise session. Pt exercised at 2.5 METs on the stepper. Will monitor and progress as able.        Expected Outcomes  Through exercise at rehab and at home, the patient will decrease shortness of breath with daily activities and feel confident in carrying out an exercse regime at home.          Discharge Exercise Prescription (Final Exercise Prescription Changes): Exercise Prescription Changes - 04/28/19 1500      Response to Exercise   Blood Pressure (Admit)  110/70    Blood Pressure (Exercise)  130/80    Blood Pressure (Exit)  112/64    Heart Rate (Admit)  89 bpm    Heart Rate (Exercise)  116 bpm    Heart Rate (Exit)  85 bpm    Oxygen Saturation (Admit)  100 %    Oxygen Saturation (Exercise)  95 %    Oxygen Saturation (Exit)  99 %    Rating of Perceived Exertion (Exercise)  11    Perceived Dyspnea (Exercise)  1    Duration  Continue with 30 min of aerobic exercise without signs/symptoms of physical distress.    Intensity  THRR unchanged      Progression   Progression  Continue to progress workloads to maintain intensity without signs/symptoms of physical distress.      Resistance Training   Training Prescription  Yes    Weight  orange bands    Reps  10-15    Time  10 Minutes      Bike   Level  2    Minutes  15      NuStep   Level  3    SPM  80    METs  15       Nutrition:  Target Goals: Understanding of nutrition guidelines, daily intake of sodium 1500mg , cholesterol 200mg , calories 30% from fat and 7% or less from saturated fats, daily to have 5 or more servings of fruits and vegetables.  Biometrics: Pre Biometrics - 04/20/19 1118      Pre Biometrics   Height  5\' 5"  (1.651 m)    Weight  50.1 kg    BMI (Calculated)  18.38    Grip Strength  15 kg        Nutrition Therapy Plan and Nutrition Goals:   Nutrition Assessments:   Nutrition Goals Re-Evaluation:   Nutrition Goals Discharge (Final Nutrition Goals Re-Evaluation):   Psychosocial: Target Goals: Acknowledge presence or absence of significant depression and/or stress, maximize coping  skills, provide positive support system. Participant is able to verbalize types and ability to use techniques and skills needed for reducing stress and depression.  Initial Review & Psychosocial Screening: Initial Psych Review & Screening - 04/20/19 1134      Initial Review   Current issues with  Current Sleep Concerns      Family Dynamics   Good Support System?  Yes      Barriers   Psychosocial barriers to participate in program  There are no identifiable barriers or psychosocial needs.      Screening Interventions   Interventions  Encouraged to exercise    Expected Outcomes  Long Term Goal: Stressors or current issues are controlled or eliminated.;Long Term goal: The participant improves quality of Life and PHQ9 Scores as seen by post scores and/or verbalization of changes       Quality of Life Scores:  Scores of 19 and below usually indicate a poorer quality of life in these areas.  A difference of  2-3 points is a clinically meaningful difference.  A difference of 2-3 points in the total score of the Quality of Life Index has been associated with significant improvement in overall quality of life, self-image, physical symptoms, and general health in studies assessing change in quality of life.  PHQ-9: Recent Review Flowsheet Data    Depression screen [facility] 2/9 04/20/2019 07/18/2018   Decreased Interest 0 0   Down, Depressed, Hopeless 0 -   PHQ - 2 Score 0 0   Altered sleeping 3 -   Tired, decreased energy 1 -   Change in appetite 0 -   Feeling bad or failure about yourself  0 -   Trouble concentrating 3 -   Moving slowly or fidgety/restless 0 -   Suicidal thoughts 0 -   PHQ-9 Score 7 -   Difficult doing work/chores Not difficult at all -     Interpretation of Total Score  Total Score Depression Severity:  1-4 = Minimal depression, 5-9 = Mild depression, 10-14 = Moderate depression, 15-19 =  Moderately severe depression, 20-27 = Severe depression   Psychosocial Evaluation  and Intervention: Psychosocial Evaluation - 04/20/19 1135      Psychosocial Evaluation & Interventions   Interventions  Relaxation education;Encouraged to exercise with the program and follow exercise prescription    Comments  in hopes exercise will improve sleeping habits and improve quality of life.    Expected Outcomes  No barriers to participation in pulmonary rehab    Continue Psychosocial Services   Follow up required by staff       Psychosocial Re-Evaluation: Psychosocial Re-Evaluation    [place] Name 04/20/19 1137 04/28/19 1550           Psychosocial Re-Evaluation   Current issues with  Current Sleep Concerns  Current Sleep Concerns      Comments  -  Just started program today, too early to see if exercise improves sleep disturbances      Expected Outcomes  -  No barriers to participation in pulmonary rehab      Interventions  -  Encouraged to attend Pulmonary Rehabilitation for the exercise      Continue Psychosocial Services   -  Follow up required by staff         Psychosocial Discharge (Final Psychosocial Re-Evaluation): Psychosocial Re-Evaluation - 04/28/19 1550      Psychosocial Re-Evaluation   Current issues with  Current Sleep Concerns    Comments  Just started program today, too early to see if exercise improves sleep disturbances    Expected Outcomes  No barriers to participation in pulmonary rehab    Interventions  Encouraged to attend Pulmonary Rehabilitation for the exercise    Continue Psychosocial Services   Follow up required by staff       Education: Education Goals: Education classes will be provided on a weekly basis, covering required topics. Participant will state understanding/return demonstration of topics presented.  Learning Barriers/Preferences: Learning Barriers/Preferences - 04/20/19 1138      Learning Barriers/Preferences   Learning Barriers  None    Learning Preferences  Audio;Computer/Internet;Group Instruction;Individual  Instruction;Pictoral;Skilled Demonstration;Verbal Instruction;Video;Written Material       Education Topics: Risk Factor Reduction:  -Group instruction that is supported by a PowerPoint presentation. Instructor discusses the definition of a risk factor, different risk factors for pulmonary disease, and how the heart and lungs work together.     Nutrition for Pulmonary Patient:  -Group instruction provided by PowerPoint slides, verbal discussion, and written materials to support subject matter. The instructor gives an explanation and review of healthy diet recommendations, which includes a discussion on weight management, recommendations for fruit and vegetable consumption, as well as protein, fluid, caffeine, fiber, sodium, sugar, and alcohol. Tips for eating when patients are short of breath are discussed.   Pursed Lip Breathing:  -Group instruction that is supported by demonstration and informational handouts. Instructor discusses the benefits of pursed lip and diaphragmatic breathing and detailed demonstration on how to preform both.     Oxygen Safety:  -Group instruction provided by PowerPoint, verbal discussion, and written material to support subject matter. There is an overview of "What is Oxygen" and "Why do we need it".  Instructor also reviews how to create a safe environment for oxygen use, the importance of using oxygen as prescribed, and the risks of noncompliance. There is a brief discussion on traveling with oxygen and resources the patient may utilize.   Oxygen Equipment:  -Group instruction provided by [facility] Staff utilizing handouts, written materials, and equipment demonstrations.  Signs and Symptoms:  -Group instruction provided by written material and verbal discussion to support subject matter. Warning signs and symptoms of infection, stroke, and heart attack are reviewed and when to call the physician/911 reinforced. Tips for preventing the spread of infection  discussed.   Advanced Directives:  -Group instruction provided by verbal instruction and written material to support subject matter. Instructor reviews Advanced Directive laws and proper instruction for filling out document.   Pulmonary Video:  -Group video education that reviews the importance of medication and oxygen compliance, exercise, good nutrition, pulmonary hygiene, and pursed lip and diaphragmatic breathing for the pulmonary patient.   Exercise for the Pulmonary Patient:  -Group instruction that is supported by a PowerPoint presentation. Instructor discusses benefits of exercise, core components of exercise, frequency, duration, and intensity of an exercise routine, importance of utilizing pulse oximetry during exercise, safety while exercising, and options of places to exercise outside of rehab.     Pulmonary Medications:  -Verbally interactive group education provided by instructor with focus on inhaled medications and proper administration.   Anatomy and Physiology of the Respiratory System and Intimacy:  -Group instruction provided by PowerPoint, verbal discussion, and written material to support subject matter. Instructor reviews respiratory cycle and anatomical components of the respiratory system and their functions. Instructor also reviews differences in obstructive and restrictive respiratory diseases with examples of each. Intimacy, Sex, and Sexuality differences are reviewed with a discussion on how relationships can change when diagnosed with pulmonary disease. Common sexual concerns are reviewed.   MD DAY -A group question and answer session with a medical doctor that allows participants to ask questions that relate to their pulmonary disease state.   OTHER EDUCATION -Group or individual verbal, written, or video instructions that support the educational goals of the pulmonary rehab program.   Holiday Eating Survival Tips:  -Group instruction provided by  PowerPoint slides, verbal discussion, and written materials to support subject matter. The instructor gives patients tips, tricks, and techniques to help them not only survive but enjoy the holidays despite the onslaught of food that accompanies the holidays.   Knowledge Questionnaire Score: Knowledge Questionnaire Score - 04/20/19 1134      Knowledge Questionnaire Score   Pre Score  16/18  pre       Core Components/Risk Factors/Patient Goals at Admission: Personal Goals and Risk Factors at Admission - 04/20/19 1138      Core Components/Risk Factors/Patient Goals on Admission   Improve shortness of breath with ADL's  Yes    Intervention  Provide education, individualized exercise plan and daily activity instruction to help decrease symptoms of SOB with activities of daily living.    Expected Outcomes  Short Term: Improve cardiorespiratory fitness to achieve a reduction of symptoms when performing ADLs;Long Term: Be able to perform more ADLs without symptoms or delay the onset of symptoms       Core Components/Risk Factors/Patient Goals Review:  Goals and Risk Factor Review    Row Name 04/20/19 1138 04/28/19 1552           Core Components/Risk Factors/Patient Goals Review   Personal Goals Review  Increase knowledge of respiratory medications and ability to use respiratory devices properly.;Improve shortness of breath with ADL's;Develop more efficient breathing techniques such as purse lipped breathing and diaphragmatic breathing and practicing self-pacing with activity.  Increase knowledge of respiratory medications and ability to use respiratory devices properly.;Improve shortness of breath with ADL's;Develop more efficient breathing techniques such as purse lipped breathing and diaphragmatic  breathing and practicing self-pacing with activity.      Review  -  Just started program today, too early to see progress towards program goals      Expected Outcomes  -  see admission goals          Core Components/Risk Factors/Patient Goals at Discharge (Final Review):  Goals and Risk Factor Review - 04/28/19 1552      Core Components/Risk Factors/Patient Goals Review   Personal Goals Review  Increase knowledge of respiratory medications and ability to use respiratory devices properly.;Improve shortness of breath with ADL's;Develop more efficient breathing techniques such as purse lipped breathing and diaphragmatic breathing and practicing self-pacing with activity.    Review  Just started program today, too early to see progress towards program goals    Expected Outcomes  see admission goals       ITP Comments:   Comments: ITP REVIEW Pt is making expected progress toward pulmonary rehab goals after completing 1 sessions. Recommend continued exercise, life style modification, education, and utilization of breathing techniques to increase stamina and strength and decrease shortness of breath with exertion.

## 2019-04-28 NOTE — Progress Notes (Signed)
Daily Session Note  Patient Details  Name: Emily Ward MRN: [id number] Date of Birth: December 22, 1955 Referring Provider:     Pulmonary Rehab Walk Test from 04/20/2019 in [place]  Referring Provider  Dr. Vaughan Browner      Encounter Date: 04/28/2019  Check In: Session Check In - 04/28/19 1459      Check-In   Supervising physician immediately available to respond to emergencies  [facility] immediately available    Physician(s)  Dr. Benny Lennert    Location  [facility]    Staff Present  Rosebud Poles, RN, Bjorn Loser, MS, Exercise Physiologist;Lisa Ysidro Evert, RN    Virtual Visit  No    Medication changes reported      No    Fall or balance concerns reported     No    Tobacco Cessation  No Change    Warm-up and Cool-down  Performed on first and last piece of equipment    Resistance Training Performed  Yes    VAD Patient?  No    PAD/SET Patient?  No      Pain Assessment   Currently in Pain?  No/denies    Multiple Pain Sites  No       Capillary Blood Glucose: No results found for this or any previous visit (from the past 24 hour(s)).  Exercise Prescription Changes - 04/28/19 1500      Response to Exercise   Blood Pressure (Admit)  110/70    Blood Pressure (Exercise)  130/80    Blood Pressure (Exit)  112/64    Heart Rate (Admit)  89 bpm    Heart Rate (Exercise)  116 bpm    Heart Rate (Exit)  85 bpm    Oxygen Saturation (Admit)  100 %    Oxygen Saturation (Exercise)  95 %    Oxygen Saturation (Exit)  99 %    Rating of Perceived Exertion (Exercise)  11    Perceived Dyspnea (Exercise)  1    Duration  Continue with 30 min of aerobic exercise without signs/symptoms of physical distress.    Intensity  THRR unchanged      Progression   Progression  Continue to progress workloads to maintain intensity without signs/symptoms of physical distress.      Resistance Training   Training Prescription  Yes    Weight  orange  bands    Reps  10-15    Time  10 Minutes      Bike   Level  2    Minutes  15      NuStep   Level  3    SPM  80    METs  15       Social History   Tobacco Use  Smoking Status Former Smoker  . Packs/day: 1.00  . Years: 20.00  . Pack years: 20.00  . Types: Cigarettes  . Quit date: 04/02/2013  . Years since quitting: 6.0  Smokeless Tobacco Never Used    Goals Met:  Exercise tolerated well  Goals Unmet:  Not Applicable  Comments: Service time is from 1340 to 1445    Dr. Rush Farmer is Medical Director for Pulmonary Rehab at [facility].

## 2019-04-30 ENCOUNTER — Ambulatory Visit (HOSPITAL_COMMUNITY): Payer: 59

## 2019-04-30 ENCOUNTER — Other Ambulatory Visit: Payer: Self-pay

## 2019-04-30 ENCOUNTER — Encounter (HOSPITAL_COMMUNITY)
Admission: RE | Admit: 2019-04-30 | Discharge: 2019-04-30 | Disposition: A | Discharge status: Home / Self Care | Payer: 59 | Source: Ambulatory Visit | Attending: Pulmonary Disease | Admitting: Pulmonary Disease

## 2019-04-30 DIAGNOSIS — J439 Emphysema, unspecified: Secondary | ICD-10-CM | POA: Diagnosis not present

## 2019-04-30 NOTE — Progress Notes (Signed)
Daily Session Note  Patient Details  Name: Florabelle Cardin MRN: [id number] Date of Birth: 1956-03-17 Referring Provider:     Pulmonary Rehab Walk Test from 04/20/2019 in [place]  Referring Provider  Dr. Vaughan Browner      Encounter Date: 04/30/2019  Check In: Session Check In - 04/30/19 1531      Check-In   Supervising physician immediately available to respond to emergencies  [facility] immediately available    Physician(s)  Dr. Benny Lennert    Location  [facility]    Staff Present  Rosebud Poles, RN, Bjorn Loser, MS, Exercise Physiologist;Jewels Langone Ysidro Evert, RN    Virtual Visit  No    Medication changes reported      No    Fall or balance concerns reported     No    Tobacco Cessation  No Change    Warm-up and Cool-down  Performed on first and last piece of equipment    Resistance Training Performed  Yes    VAD Patient?  No    PAD/SET Patient?  No      Pain Assessment   Currently in Pain?  No/denies    Multiple Pain Sites  No       Capillary Blood Glucose: No results found for this or any previous visit (from the past 24 hour(s)).    Social History   Tobacco Use  Smoking Status Former Smoker  . Packs/day: 1.00  . Years: 20.00  . Pack years: 20.00  . Types: Cigarettes  . Quit date: 04/02/2013  . Years since quitting: 6.0  Smokeless Tobacco Never Used    Goals Met:  Exercise tolerated well No report of cardiac concerns or symptoms Strength training completed today  Goals Unmet:  Not Applicable  Comments: Service time is from 1330 to 1445    Dr. Rush Farmer is Medical Director for Pulmonary Rehab at [facility].

## 2019-05-01 NOTE — Progress Notes (Deleted)
Office Visit Note  Patient: Emily Ward             Date of Birth: [birth year]           MRN: [id number]             PCP: Laurey Morale, MD Referring: Laurey Morale, MD Visit Date: 05/15/2019 Occupation: '@GUAROCC' @  Subjective:  No chief complaint on file.   History of Present Illness: Emily Ward is a 63 y.o. female ***   Activities of Daily Living:  Patient reports morning stiffness for *** {minute/hour:19697}.   Patient {ACTIONS;DENIES/REPORTS:21021675::"Denies"} nocturnal pain.  Difficulty dressing/grooming: {ACTIONS;DENIES/REPORTS:21021675::"Denies"} Difficulty climbing stairs: {ACTIONS;DENIES/REPORTS:21021675::"Denies"} Difficulty getting out of chair: {ACTIONS;DENIES/REPORTS:21021675::"Denies"} Difficulty using hands for taps, buttons, cutlery, and/or writing: {ACTIONS;DENIES/REPORTS:21021675::"Denies"}  No Rheumatology ROS completed.   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/04/2017   Palpitations 07/30/2017   Shortness of breath 07/30/2017   Hormone replacement therapy (HRT) 03/30/2017   Former smoker 03/30/2017   Other fatigue 06/30/2016   Insomnia 06/30/2016   DJD (degenerative joint disease), cervical 06/30/2016   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/2016   IBS (irritable bowel syndrome) 06/30/2016   Interstitial cystitis 06/30/2016   SHOULDER PAIN, RIGHT 09/05/2010   GERD 03/24/2009   SACROILIAC STRAIN 03/24/2009   Atypical chest pain 01/20/2008   HEMATURIA UNSPECIFIED 10/14/2007   FIBROMYALGIA 07/03/2007   HYPERLIPIDEMIA NEC/NOS 05/16/2007   HEADACHE 05/09/2007    Past Medical History:  Diagnosis Date   Cervical disc disease 2006   Emphysema lung ([place])    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     Fibromyalgia    sees Dr. 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 brother   Arthritis Son        psoriatic   Colon cancer Neg Hx    Esophageal cancer Neg Hx    Rectal cancer Neg Hx    Stomach cancer Neg Hx    Past Surgical History:  Procedure Laterality Date   CERVICAL LAMINECTOMY  2006   [place]   CHOLECYSTECTOMY  06/2002   CYSTOSCOPY N/A 09/01/2018   Procedure: CYSTOSCOPY;  Surgeon: Megan Salon, MD;  Location: [facility];  Service: Gynecology;  Laterality: N/A;   DILATATION & CURETTAGE/HYSTEROSCOPY WITH MYOSURE N/A 04/12/2017   Procedure: DILATATION & CURETTAGE/HYSTEROSCOPY;  Surgeon: Megan Salon, MD;  Location: [facility];  Service: Gynecology;  Laterality: N/A;   HYSTEROSCOPY     years ago with Dr Radene Knee-? [place] WITH SALPINGECTOMY Bilateral 09/01/2018  Procedure: TOTAL LAPAROSCOPIC HYSTERECTOMY WITH SALPINGECTOMY  BSO, REPAIR OF PERINEAL LACERATION;  Surgeon: Megan Salon, MD;  Location: [place];  Service: Gynecology;  Laterality: Bilateral;  possible BSO   URETHRAL DILATION     Social History   Social History Narrative   Not on file   Immunization History  Administered Date(s) Administered   Influenza Whole 06/30/2007, 05/04/2010   Influenza,inj,Quad PF,6+ Mos 07/12/2017, 05/23/2018   Influenza-Unspecified 06/02/2014, 06/03/2016   MMR 06/30/2007    Pneumococcal Polysaccharide-23 03/23/2019   Td 09/03/1998, 05/05/2009   Tdap 10/22/2018     Objective: Vital Signs: LMP 09/03/2000 (Approximate)    Physical Exam   Musculoskeletal Exam: ***  CDAI Exam: CDAI Score: -- Patient Global: --; Provider Global: -- Swollen: --; Tender: -- Joint Exam   No joint exam has been documented for this visit   There is currently no information documented on the homunculus. Go to the Rheumatology activity and complete the homunculus joint exam.  Investigation: No additional findings.  Imaging: Ct Chest Lung Ca Screen Low Dose W/o Cm  Result Date: 04/28/2019 CLINICAL DATA:  63 year old female former smoker (quit in 2014) with 35 pack-year history of smoking. Lung cancer screening examination. EXAM: CT CHEST WITHOUT CONTRAST LOW-DOSE FOR LUNG CANCER SCREENING TECHNIQUE: Multidetector CT imaging of the chest was performed following the standard protocol without IV contrast. COMPARISON:  No priors. FINDINGS: Cardiovascular: Heart size is normal. Small amount of anterior pericardial fluid and/or thickening, unlikely to be of any hemodynamic significance at this time. No associated pericardial calcification. No atherosclerotic calcifications in the thoracic aorta or the coronary arteries. Mediastinum/Nodes: No pathologically enlarged mediastinal or hilar lymph nodes. Please note that accurate exclusion of hilar adenopathy is limited on noncontrast CT scans. Esophagus is unremarkable in appearance. No axillary lymphadenopathy. Lungs/Pleura: Small pulmonary nodule in the periphery of the right lower lobe (axial image 261 of series 3), with a volume derived mean diameter of only 3.3 mm. No larger more suspicious appearing pulmonary nodules or masses are noted. No acute consolidative airspace disease. No pleural effusions. Diffuse bronchial wall thickening with mild centrilobular and paraseptal emphysema. Linear areas of scarring in the medial aspects of the lower  lobes of the lungs bilaterally, presumably related to prior infection. Upper Abdomen: Status post cholecystectomy. Musculoskeletal: There are no aggressive appearing lytic or blastic lesions noted in the visualized portions of the skeleton. IMPRESSION: 1. Lung-RADS 2, benign appearance or behavior. Continue annual screening with low-dose chest CT without contrast in 12 months. 2. Mild diffuse bronchial wall thickening with mild centrilobular and paraseptal emphysema; imaging findings suggestive of underlying COPD. Emphysema (ICD10-J43.9). Electronically Signed   By: Vinnie Langton M.D.   On: 04/28/2019 08:47    Recent Labs: Lab Results  Component Value Date   WBC 6.5 10/22/2018   HGB 12.4 10/22/2018   PLT 141 (L) 10/22/2018   NA 141 10/22/2018   K 3.9 10/22/2018   CL 110 10/22/2018   CO2 23 10/22/2018   GLUCOSE 97 10/22/2018   BUN 13 10/22/2018   CREATININE 0.54 10/22/2018   BILITOT 0.4 09/08/2018   ALKPHOS 73 09/08/2018   AST 25 09/08/2018   ALT 13 09/08/2018   PROT 6.5 09/08/2018   ALBUMIN 4.3 09/08/2018   CALCIUM 8.9 10/22/2018   GFRAA >60 10/22/2018    Speciality Comments: No specialty comments available.  Procedures:  No procedures performed Allergies: Depakote [divalproex sodium] and Nsaids   Assessment / Plan:     Visit Diagnoses: No  diagnosis found.  Orders: No orders of the defined types were placed in this encounter.  No orders of the defined types were placed in this encounter.   Face-to-face time spent with patient was *** minutes. Greater than 50% of time was spent in counseling and coordination of care.  Follow-Up Instructions: No follow-ups on file.   Ofilia Neas, PA-C  Note - This record has been created using Dragon software.  Chart creation errors have been sought, but may not always  have been located. Such creation errors do not reflect on  the standard of medical care.

## 2019-05-03 ENCOUNTER — Other Ambulatory Visit: Payer: Self-pay | Admitting: Rheumatology

## 2019-05-04 NOTE — Telephone Encounter (Signed)
Last Visit: 11/14/18 Next Visit: 05/15/19  Okay to refill per Dr. Deveshwar 

## 2019-05-05 ENCOUNTER — Encounter (HOSPITAL_COMMUNITY)
Admission: RE | Admit: 2019-05-05 | Discharge: 2019-05-05 | Disposition: A | Discharge status: Home / Self Care | Payer: 59 | Source: Ambulatory Visit | Attending: Pulmonary Disease | Admitting: Pulmonary Disease

## 2019-05-05 ENCOUNTER — Ambulatory Visit (HOSPITAL_COMMUNITY): Payer: 59

## 2019-05-05 ENCOUNTER — Other Ambulatory Visit: Payer: Self-pay

## 2019-05-05 DIAGNOSIS — J439 Emphysema, unspecified: Secondary | ICD-10-CM | POA: Insufficient documentation

## 2019-05-05 NOTE — Progress Notes (Signed)
Daily Session Note  Patient Details  Name: Emily Ward MRN: [id number] Date of Birth: Aug 22, 1956 Referring Provider:     Pulmonary Rehab Walk Test from 04/20/2019 in [place]  Referring Provider  Dr. Vaughan Browner      Encounter Date: 05/05/2019  Check In: Session Check In - 05/05/19 1356      Check-In   Supervising physician immediately available to respond to emergencies  [facility] immediately available    Physician(s)  Dr. Benny Lennert    Location  [facility]    Staff Present  Rosebud Poles, RN, BSN;Carlette Wilber Oliphant, RN, Bjorn Loser, MS, Exercise Physiologist    Virtual Visit  No    Medication changes reported      No    Fall or balance concerns reported     No    Tobacco Cessation  No Change    Warm-up and Cool-down  Performed as group-led instruction    Resistance Training Performed  Yes    VAD Patient?  No    PAD/SET Patient?  No      Pain Assessment   Currently in Pain?  No/denies    Multiple Pain Sites  No       Capillary Blood Glucose: No results found for this or any previous visit (from the past 24 hour(s)).    Social History   Tobacco Use  Smoking Status Former Smoker  . Packs/day: 1.00  . Years: 20.00  . Pack years: 20.00  . Types: Cigarettes  . Quit date: 04/02/2013  . Years since quitting: 6.0  Smokeless Tobacco Never Used    Goals Met:  Exercise tolerated well  Goals Unmet:  Not Applicable  Comments: Service time is from 1325 to 1430    Dr. Rush Farmer is Medical Director for Pulmonary Rehab at [facility].

## 2019-05-07 ENCOUNTER — Ambulatory Visit (HOSPITAL_COMMUNITY): Payer: 59

## 2019-05-07 ENCOUNTER — Other Ambulatory Visit: Payer: Self-pay

## 2019-05-07 ENCOUNTER — Encounter (HOSPITAL_COMMUNITY)
Admission: RE | Admit: 2019-05-07 | Discharge: 2019-05-07 | Disposition: A | Discharge status: Home / Self Care | Payer: 59 | Source: Ambulatory Visit | Attending: Pulmonary Disease | Admitting: Pulmonary Disease

## 2019-05-07 DIAGNOSIS — J439 Emphysema, unspecified: Secondary | ICD-10-CM

## 2019-05-07 NOTE — Progress Notes (Signed)
Daily Session Note  Patient Details  Name: Venice Marcucci MRN: [id number] Date of Birth: [birth year] Referring Provider:     Pulmonary Rehab Walk Test from 04/20/2019 in [place]  Referring Provider  Dr. Vaughan Browner      Encounter Date: 05/07/2019  Check In: Session Check In - 05/07/19 1402      Check-In   Supervising physician immediately available to respond to emergencies  [facility] immediately available    Physician(s)  Dr. Benny Lennert    Staff Present  Rosebud Poles, RN, Bjorn Loser, MS, Exercise Physiologist;Karlisa Gaubert Ysidro Evert, RN    Virtual Visit  No    Medication changes reported      No    Fall or balance concerns reported     No    Tobacco Cessation  No Change    Warm-up and Cool-down  Performed on first and last piece of equipment    Resistance Training Performed  Yes    VAD Patient?  No    PAD/SET Patient?  No      Pain Assessment   Currently in Pain?  No/denies    Multiple Pain Sites  No       Capillary Blood Glucose: No results found for this or any previous visit (from the past 24 hour(s)).  Exercise Prescription Changes - 05/07/19 1500      Home Exercise Plan   Plans to continue exercise at  Home (comment)    Frequency  Add 3 additional days to program exercise sessions.    Initial Home Exercises Provided  05/07/19       Social History   Tobacco Use  Smoking Status Former Smoker  . Packs/day: 1.00  . Years: 20.00  . Pack years: 20.00  . Types: Cigarettes  . Quit date: 04/02/2013  . Years since quitting: 6.0  Smokeless Tobacco Never Used    Goals Met:  Exercise tolerated well No report of cardiac concerns or symptoms Strength training completed today  Goals Unmet:  Not Applicable  Comments: Service time is from 1328 to 23    Dr. Rush Farmer is Medical Director for Pulmonary Rehab at [facility].

## 2019-05-07 NOTE — Progress Notes (Signed)
Office Visit Note  Patient: Emily Ward             Date of Birth: 01-05-1956           MRN: [id number]             PCP: Laurey Morale, MD Referring: Laurey Morale, MD Visit Date: 05/20/2019 Occupation: _0 @  Subjective:  TMJ bilaterally   History of Present Illness: Shaia Porath is a 63 y.o. female with history of fibromyalgia, DDD, and osteoporosis.  She takes cymbalta 30 mg 2 capsules every morning.  She continues have generalized muscle aches muscle tenderness due to fibromyalgia.  She has trapezius muscle tension and muscle tenderness bilaterally.  She takes Flexeril 10 mg TID PRN for muscle spasms and Mobic 7.5 mg 1 tablet by mouth as needed for pain relief.  She has been experiencing symptoms of TMJ bilaterally for the past 3 to 4 months.  She reports that she has been grinding her teeth at night and her dentist mentioned that she is wearing down her molars.  She would like to get a mouthguard.  She is been having increased muscle tension as well as more frequent headaches related to TMJ.  She has been to physical therapy in the past and they perform masseter massages which did help.  She would like referral to physical therapy.  She has been having increased difficulty sleeping at night.  She takes ambien 10 mg po at bedtime for insomnia.  She states that she started going to pulmonary rehab about 3 weeks ago and has been going twice weekly.  She states that she has been starting to ride the bike at rehab and has had occasional discomfort in the left knee joint.  She denies any knee joint swelling and states that the pain has not interfered with her activities. She continues to get Prolia subcu injections every 6 months and takes vitamin D and calcium.  Activities of Daily Living:  Patient reports morning stiffness for 0 minutes.   Patient Denies nocturnal pain.  Difficulty dressing/grooming: Denies Difficulty climbing stairs: Denies Difficulty getting out  of chair: Denies Difficulty using hands for taps, buttons, cutlery, and/or writing: Reports  Review of Systems  Constitutional: Positive for fatigue.  HENT: Positive for mouth dryness. Negative for mouth sores and nose dryness.   Eyes: Negative for pain, itching, visual disturbance and dryness.  Respiratory: Positive for shortness of breath. Negative for cough, hemoptysis and difficulty breathing.        Due to COPD  Cardiovascular: Negative for chest pain, palpitations, hypertension and swelling in legs/feet.  Gastrointestinal: Negative for blood in stool, constipation and diarrhea.  Endocrine: Negative for increased urination.  Genitourinary: Negative for difficulty urinating and painful urination.  Musculoskeletal: Positive for arthralgias and joint pain. Negative for joint swelling, myalgias, muscle weakness, morning stiffness, muscle tenderness and myalgias.  Skin: Negative for color change, pallor, rash, hair loss, nodules/bumps, skin tightness, ulcers and sensitivity to sunlight.  Allergic/Immunologic: Negative for susceptible to infections.  Neurological: Negative for dizziness, numbness, memory loss and weakness.  Hematological: Negative for swollen glands.  Psychiatric/Behavioral: Positive for sleep disturbance. Negative for depressed mood and confusion. The patient is not nervous/anxious.     PMFS History:  Patient Active Problem List   Diagnosis Date Noted  . Concussion 10/27/2018  . Postmenopausal bleeding 09/01/2018  . MGUS (monoclonal gammopathy of unknown significance) 02/13/2018  . 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 Palpitations 07/30/2017  . Shortness  of breath 07/30/2017  . Hormone replacement therapy (HRT) 03/30/2017  . Former smoker 03/30/2017  . Other fatigue 06/30/2016  . Insomnia 06/30/2016  . DJD (degenerative joint disease), cervical 06/30/2016  . 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 IBS (irritable bowel  syndrome) 06/30/2016  . Interstitial cystitis 06/30/2016  . SHOULDER PAIN, RIGHT 09/05/2010  . GERD 03/24/2009  . SACROILIAC STRAIN 03/24/2009  . Atypical chest pain 01/20/2008  . HEMATURIA UNSPECIFIED 10/14/2007  . FIBROMYALGIA 07/03/2007  . HYPERLIPIDEMIA NEC/NOS 05/16/2007  . HEADACHE 05/09/2007    Past Medical History:  Diagnosis Date  . Cervical disc disease 2006  . COPD (chronic obstructive pulmonary disease) ([place])   . Emphysema lung ([place])   . 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    . Fibromyalgia    sees Dr. Estanislado Pandy   . Heart murmur   . 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 Hyperlipidemia   . Migraines   . MVP (mitral valve prolapse)    no daily medication  . Peptic ulcer    no problems    Family History  Problem Relation Age of Onset  . Breast cancer Mother        lumpectomy in late 50/60  . Heart attack Father   . Congestive Heart Failure Father   . Cancer Other        breast/fhx  . Heart disease Other        fhx  . Diabetes type II Sister        and ? brother  . Arthritis Son        psoriatic  . Colon cancer Neg Hx   . Esophageal cancer Neg Hx   . Rectal cancer Neg Hx   . Stomach cancer Neg Hx    Past Surgical History:  Procedure Laterality Date  . CERVICAL LAMINECTOMY  2006  . CESAREAN SECTION  1993  . CHOLECYSTECTOMY  06/2002  . CYSTOSCOPY N/A 09/01/2018   Procedure: CYSTOSCOPY;  Surgeon: Megan Salon, MD;  [facility];  Service: Gynecology;  Laterality: N/A;  . DILATATION & CURETTAGE/HYSTEROSCOPY WITH MYOSURE N/A 04/12/2017   Procedure: DILATATION & CURETTAGE/HYSTEROSCOPY;  Surgeon: Megan Salon, MD;  Location: [facility];  Service: Gynecology;  Laterality: N/A;  . HYSTEROSCOPY     years ago with Dr Radene Knee-? 2000  . TOTAL LAPAROSCOPIC HYSTERECTOMY WITH SALPINGECTOMY Bilateral 09/01/2018   Procedure: TOTAL LAPAROSCOPIC HYSTERECTOMY WITH SALPINGECTOMY   BSO, REPAIR OF PERINEAL LACERATION;  Surgeon: Megan Salon, MD;  Location: [facility];  Service: Gynecology;  Laterality: Bilateral;  possible BSO  . URETHRAL DILATION     Social History   Social History Narrative  . Not on file   Immunization History  Administered Date(s) Administered  . Influenza Whole 06/30/2007, 05/04/2010  . Influenza,inj,Quad PF,6+ Mos 07/12/2017, 05/23/2018  . Influenza-Unspecified 06/02/2014, 06/03/2016  . MMR 06/30/2007  . Pneumococcal Polysaccharide-23 03/23/2019  . Td 09/03/1998, 05/05/2009  . Tdap 10/22/2018     Objective: Vital Signs: BP 110/68 (BP Location: Left Arm, Patient Position: Sitting, Cuff Size: Normal)   Pulse 74   Resp 12   Ht 5' 5" (1.651 m)   Wt 112 lb 9.6 oz (51.1 kg)   LMP 09/03/2000 (Approximate)   BMI 18.74 kg/m    Physical Exam Vitals signs and nursing note reviewed.  Constitutional:      Appearance: She is  well-developed.  HENT:     Head: Normocephalic and atraumatic.  Eyes:     Conjunctiva/sclera: Conjunctivae normal.  Neck:     Musculoskeletal: Normal range of motion.  Cardiovascular:     Rate and Rhythm: Normal rate and regular rhythm.     Heart sounds: Normal heart sounds.  Pulmonary:     Effort: Pulmonary effort is normal.     Breath sounds: Normal breath sounds.  Abdominal:     General: Bowel sounds are normal.     Palpations: Abdomen is soft.  Lymphadenopathy:     Cervical: No cervical adenopathy.  Skin:    General: Skin is warm and dry.     Capillary Refill: Capillary refill takes less than 2 seconds.  Neurological:     Mental Status: She is alert and oriented to person, place, and time.  Psychiatric:        Behavior: Behavior normal.      Musculoskeletal Exam: C-spine, thoracic spine, lumbar spine good range of motion.  No midline spinal tenderness.  No SI joint tenderness.  Shoulder joints, elbow joints, wrist joints, MCPs, PIPs, DIPs good range of motion no synovitis.  She has  complete fist formation bilaterally.  Hip joints, knee joints, ankle joints, MTPs, PIPs, DIPs good range of motion no synovitis.  No warmth or effusion of bilateral knee joints.  No tenderness or swelling of ankle joints.  No tenderness of MTP joints.  Tenderness over bilateral TMJs.   CDAI Exam: CDAI Score: - Patient Global: -; Provider Global: - Swollen: -; Tender: - Joint Exam   No joint exam has been documented for this visit   There is currently no information documented on the homunculus. Go to the Rheumatology activity and complete the homunculus joint exam.  Investigation: No additional findings.  Imaging: Ct Chest Lung Ca Screen Low Dose W/o Cm  Result Date: 04/28/2019 CLINICAL DATA:  63 year old female former smoker (quit in 2014) with 35 pack-year history of smoking. Lung cancer screening examination. EXAM: CT CHEST WITHOUT CONTRAST LOW-DOSE FOR LUNG CANCER SCREENING TECHNIQUE: Multidetector CT imaging of the chest was performed following the standard protocol without IV contrast. COMPARISON:  No priors. FINDINGS: Cardiovascular: Heart size is normal. Small amount of anterior pericardial fluid and/or thickening, unlikely to be of any hemodynamic significance at this time. No associated pericardial calcification. No atherosclerotic calcifications in the thoracic aorta or the coronary arteries. Mediastinum/Nodes: No pathologically enlarged mediastinal or hilar lymph nodes. Please note that accurate exclusion of hilar adenopathy is limited on noncontrast CT scans. Esophagus is unremarkable in appearance. No axillary lymphadenopathy. Lungs/Pleura: Small pulmonary nodule in the periphery of the right lower lobe (axial image 261 of series 3), with a volume derived mean diameter of only 3.3 mm. No larger more suspicious appearing pulmonary nodules or masses are noted. No acute consolidative airspace disease. No pleural effusions. Diffuse bronchial wall thickening with mild centrilobular and  paraseptal emphysema. Linear areas of scarring in the medial aspects of the lower lobes of the lungs bilaterally, presumably related to prior infection. Upper Abdomen: Status post cholecystectomy. Musculoskeletal: There are no aggressive appearing lytic or blastic lesions noted in the visualized portions of the skeleton. IMPRESSION: 1. Lung-RADS 2, benign appearance or behavior. Continue annual screening with low-dose chest CT without contrast in 12 months. 2. Mild diffuse bronchial wall thickening with mild centrilobular and paraseptal emphysema; imaging findings suggestive of underlying COPD. Emphysema (ICD10-J43.9). Electronically Signed   By: Vinnie Langton M.D.   On: 04/28/2019 08:47  Recent Labs: Lab Results  Component Value Date   WBC 6.5 10/22/2018   HGB 12.4 10/22/2018   PLT 141 (L) 10/22/2018   NA 141 10/22/2018   K 3.9 10/22/2018   CL 110 10/22/2018   CO2 23 10/22/2018   GLUCOSE 97 10/22/2018   BUN 13 10/22/2018   CREATININE 0.54 10/22/2018   BILITOT 0.4 09/08/2018   ALKPHOS 73 09/08/2018   AST 25 09/08/2018   ALT 13 09/08/2018   PROT 6.5 09/08/2018   ALBUMIN 4.3 09/08/2018   CALCIUM 8.9 10/22/2018   GFRAA >60 10/22/2018    Speciality Comments: No specialty comments available.  Procedures:  No procedures performed Allergies: Depakote [divalproex sodium] and Nsaids   Assessment / Plan:     Visit Diagnoses: Fibromyalgia: She has generalized muscle aches and muscle tenderness especially in the upper extremities due to fibromyalgia.  She has trapezius muscle tension and muscle tenderness bilaterally.  She has been experiencing more frequent headaches due to the increased muscle tension.  She is also having TMJ syndrome bilaterally.  She would like a referral to physical therapy for trapezius muscle tension as well as TMJ syndrome.  She continues to take Flexeril 10 mg 3 times daily as needed for muscle spasms and meloxicam 7.5 mg 1 tablet by mouth as needed daily.  She  reports that recently she has been having to take meloxicam on a daily basis due to the increased pain.  She take Cymbalta 30 mg 2 capsules by mouth daily.  She does not need any refills at this time.  She continues to have chronic fatigue related to insomnia.  She is been having increased difficulty sleeping at night due to increased discomfort as well as stress.  She takes Ambien 10 mg by mouth at bedtime.  She will follow-up in the office in 6 months.  Primary insomnia: She takes Ambien 10 mg by mouth at bedtime for insomnia.  She is been having difficulty sleeping at night recently.  She is also noticed that she started grinding her teeth at night.  She is having TMJ syndrome bilaterally which is been causing some discomfort.  Other fatigue: She has chronic fatigue related to insomnia.   TMJ (temporomandibular joint syndrome) -she has tenderness bilaterally.  She has been experiencing symptoms of TMJ syndrome for the past 3 to 4 months.  Her symptoms have progressively been getting worse.  She has been experiencing nocturnal pain.  She is also seen her dentist who has notified her that she has been grinding her teeth at night.  She would like to get fitted for a mouthguard.  We discussed the importance of applying warm compresses.  She continues to take meloxicam 7.5 mg 1 tablet by mouth daily for pain relief.  She also requested a referral to physical therapy which has helped her symptoms in the past.  A referral was placed today.  She was also given a handout of information about TMJ syndrome.  Plan: Ambulatory referral to Physical Therapy  Trapezius muscle spasm: She has trapezius muscle tension and muscle tenderness bilaterally.  She has been experiencing increased stress recently which is contributing to the increased muscle tension.  She experiences spasms occasionally and takes Flexeril 3 times daily as needed for muscle spasms.  She would like a referral to physical therapy to help with muscle  tension and muscle tenderness.  DDD (degenerative disc disease), cervical: She has good range of motion with no discomfort at this time.  She has no symptoms  of radiculopathy.  She does have trapezius muscle tension and muscle tenderness bilaterally.  She experiences muscle spasms occasionally.  She takes Flexeril 10 mg 3 times daily as needed for muscle spasms.  She would like a referral to PT to work on trapezius muscle tension.  DDD (degenerative disc disease), lumbar: She has good range of motion with no discomfort at this time.  She has no symptoms of radiculopathy.  No midline spinal tenderness.  She has occasional muscle tension and spasms in her lower back and uses Biofreeze topically.  She takes Flexeril 10 mg 3 times daily as needed for muscle spasms  Age-related osteoporosis without current pathological fracture: DEXA 12/13/2017 BMD femur total left 0.516 with T score -3.9.  She is receiving Prolia subcu injections every 6 months.  She continues to take a calcium and vitamin D supplement.  She has not had any recent falls or fractures.  Other medical conditions are listed as follows:   Petechiae  History of gastroesophageal reflux (GERD)  History of IBS  Interstitial cystitis  Orders: Orders Placed This Encounter  Procedures  . Ambulatory referral to Physical Therapy   No orders of the defined types were placed in this encounter.   Face-to-face time spent with patient was 30 minutes. Greater than 50% of time was spent in counseling and coordination of care.  Follow-Up Instructions: Return in about 6 months (around 11/17/2019) for Fibromyalgia, DDD, Osteoporosis.   Ofilia Neas, PA-C   I examined and evaluated the patient with Hazel Sams PA.  Patient had tenderness on palpation over bilateral TMJ consistent with TMJ arthritis.  We advised physical therapy and also warm compresses.  The plan of care was discussed as noted above.  Bo Merino, MD  Note - This record  has been created using Editor, commissioning.  Chart creation errors have been sought, but may not always  have been located. Such creation errors do not reflect on  the standard of medical care.

## 2019-05-07 NOTE — Progress Notes (Addendum)
I have reviewed a Home Exercise Prescription with Mauri Brooklyn . Emily Ward is  currently exercising at home.  The patient was advised to walk 3 days a week for 30-45 minutes.  Emer and I discussed how to progress their exercise prescription.  The patient stated that their goals were to stay off of oxygen.  The patient stated that they understand the exercise prescription.  We reviewed exercise guidelines, target heart rate during exercise, RPE Scale, weather conditions, NTG use, endpoints for exercise, warmup and cool down.  Patient is encouraged to come to me with any questions. I will continue to follow up with the patient to assist them with progression and safety.    Hoy Register, M.S., ACSM CEP

## 2019-05-12 ENCOUNTER — Ambulatory Visit (HOSPITAL_COMMUNITY): Payer: 59

## 2019-05-12 ENCOUNTER — Other Ambulatory Visit: Payer: Self-pay

## 2019-05-12 ENCOUNTER — Encounter (HOSPITAL_COMMUNITY)
Admission: RE | Admit: 2019-05-12 | Discharge: 2019-05-12 | Disposition: A | Discharge status: Home / Self Care | Payer: 59 | Source: Ambulatory Visit | Attending: Pulmonary Disease | Admitting: Pulmonary Disease

## 2019-05-12 VITALS — Wt 113.3 lb

## 2019-05-12 DIAGNOSIS — J439 Emphysema, unspecified: Secondary | ICD-10-CM | POA: Diagnosis not present

## 2019-05-12 NOTE — Progress Notes (Signed)
Daily Session Note  Patient Details  Name: Emily Ward MRN: [id number] Date of Birth: Jan 13, 1956 Referring Provider:     Pulmonary Rehab Walk Test from 04/20/2019 in [place]  Referring Provider  Dr. Vaughan Browner      Encounter Date: 05/12/2019  Check In: Session Check In - 05/12/19 1415      Check-In   Supervising physician immediately available to respond to emergencies  [facility] immediately available    Physician(s)  Dr. Benny Lennert    Location  [facility]    Staff Present  Rosebud Poles, RN, Bjorn Loser, MS, Exercise Physiologist;Portia Rollene Rotunda, RN, BSN    Virtual Visit  No    Medication changes reported      No    Fall or balance concerns reported     No    Tobacco Cessation  No Change    Warm-up and Cool-down  Performed on first and last piece of equipment    Resistance Training Performed  Yes    VAD Patient?  No    PAD/SET Patient?  No      Pain Assessment   Currently in Pain?  No/denies    Multiple Pain Sites  No       Capillary Blood Glucose: No results found for this or any previous visit (from the past 24 hour(s)).  Exercise Prescription Changes - 05/12/19 1600      Response to Exercise   Blood Pressure (Admit)  110/66    Blood Pressure (Exercise)  128/78    Blood Pressure (Exit)  100/62    Heart Rate (Admit)  82 bpm    Heart Rate (Exercise)  107 bpm    Heart Rate (Exit)  88 bpm    Oxygen Saturation (Admit)  95 %    Oxygen Saturation (Exercise)  91 %    Oxygen Saturation (Exit)  96 %    Rating of Perceived Exertion (Exercise)  12    Perceived Dyspnea (Exercise)  1    Duration  Continue with 30 min of aerobic exercise without signs/symptoms of physical distress.    Intensity  THRR unchanged      Progression   Progression  Continue to progress workloads to maintain intensity without signs/symptoms of physical distress.      Resistance Training   Training Prescription  Yes    Weight   orange bands    Reps  10-15    Time  10 Minutes      Bike   Level  2    Minutes  15    METs  3.9      NuStep   Level  3    SPM  80    Minutes  15    METs  2.6       Social History   Tobacco Use  Smoking Status Former Smoker  . Packs/day: 1.00  . Years: 20.00  . Pack years: 20.00  . Types: Cigarettes  . Quit date: 04/02/2013  . Years since quitting: 6.1  Smokeless Tobacco Never Used    Goals Met:  Exercise tolerated well  Goals Unmet:  Not Applicable  Comments: Service time is from 1345 to [place]    Dr. Rush Farmer is Medical Director for Pulmonary Rehab at [facility].

## 2019-05-14 ENCOUNTER — Ambulatory Visit (HOSPITAL_COMMUNITY): Payer: 59

## 2019-05-14 ENCOUNTER — Encounter (HOSPITAL_COMMUNITY)
Admission: RE | Admit: 2019-05-14 | Discharge: 2019-05-14 | Disposition: A | Discharge status: Home / Self Care | Payer: 59 | Source: Ambulatory Visit | Attending: Pulmonary Disease | Admitting: Pulmonary Disease

## 2019-05-14 ENCOUNTER — Other Ambulatory Visit: Payer: Self-pay

## 2019-05-14 DIAGNOSIS — J439 Emphysema, unspecified: Secondary | ICD-10-CM

## 2019-05-14 NOTE — Progress Notes (Signed)
Daily Session Note  Patient Details  Name: Emily Ward MRN: [id number] Date of Birth: Dec 04, 1955 Referring Provider:     Pulmonary Rehab Walk Test from 04/20/2019 in [place]  Referring Provider  Dr. Vaughan Browner      Encounter Date: 05/14/2019  Check In: Session Check In - 05/14/19 1406      Check-In   Supervising physician immediately available to respond to emergencies  [facility] immediately available    Physician(s)  Dr. Benny Lennert    Location  [facility]    Staff Present  Rosebud Poles, RN, Bjorn Loser, MS, Exercise Physiologist;Thurl Boen Ysidro Evert, RN    Virtual Visit  No    Medication changes reported      No    Fall or balance concerns reported     No    Tobacco Cessation  No Change    Warm-up and Cool-down  Performed on first and last piece of equipment    Resistance Training Performed  Yes    VAD Patient?  No    PAD/SET Patient?  No      Pain Assessment   Currently in Pain?  No/denies    Multiple Pain Sites  No       Capillary Blood Glucose: No results found for this or any previous visit (from the past 24 hour(s)).    Social History   Tobacco Use  Smoking Status Former Smoker  . Packs/day: 1.00  . Years: 20.00  . Pack years: 20.00  . Types: Cigarettes  . Quit date: 04/02/2013  . Years since quitting: 6.1  Smokeless Tobacco Never Used    Goals Met:  Exercise tolerated well No report of cardiac concerns or symptoms Strength training completed today  Goals Unmet:  Not Applicable  Comments: Service time is from 1330 to 1435    Dr. Rush Farmer is Medical Director for Pulmonary Rehab at [facility].

## 2019-05-15 ENCOUNTER — Ambulatory Visit: Payer: Self-pay | Admitting: Physician Assistant

## 2019-05-19 ENCOUNTER — Other Ambulatory Visit: Payer: Self-pay

## 2019-05-19 ENCOUNTER — Ambulatory Visit (HOSPITAL_COMMUNITY): Payer: 59

## 2019-05-19 ENCOUNTER — Encounter (HOSPITAL_COMMUNITY)
Admission: RE | Admit: 2019-05-19 | Discharge: 2019-05-19 | Disposition: A | Discharge status: Home / Self Care | Payer: 59 | Source: Ambulatory Visit | Attending: Pulmonary Disease | Admitting: Pulmonary Disease

## 2019-05-19 DIAGNOSIS — J439 Emphysema, unspecified: Secondary | ICD-10-CM

## 2019-05-19 NOTE — Progress Notes (Signed)
Daily Session Note  Patient Details  Name: Emily Ward MRN: [id number] Date of Birth: [birth year] Referring Provider:     Pulmonary Rehab Walk Test from 04/20/2019 in [place]  Referring Provider  Dr. Vaughan Browner      Encounter Date: 05/19/2019  Check In: Session Check In - 05/19/19 1336      Check-In   Supervising physician immediately available to respond to emergencies  [facility] immediately available    Physician(s)  Dr. Benny Lennert    Location  [facility]    Staff Present  Rosebud Poles, RN, Bjorn Loser, MS, Exercise Physiologist;Lisa Ysidro Evert, RN    Virtual Visit  No    Medication changes reported      No    Fall or balance concerns reported     No    Tobacco Cessation  No Change    Warm-up and Cool-down  Performed as group-led instruction    Resistance Training Performed  Yes    VAD Patient?  No    PAD/SET Patient?  No      Pain Assessment   Currently in Pain?  No/denies    Multiple Pain Sites  No       Capillary Blood Glucose: No results found for this or any previous visit (from the past 24 hour(s)).    Social History   Tobacco Use  Smoking Status Former Smoker  . Packs/day: 1.00  . Years: 20.00  . Pack years: 20.00  . Types: Cigarettes  . Quit date: 04/02/2013  . Years since quitting: 6.1  Smokeless Tobacco Never Used    Goals Met:  Exercise tolerated well  Goals Unmet:  Not Applicable  Comments: Service time is from 1323 to 1430    Dr. Rush Farmer is Medical Director for Pulmonary Rehab at [facility].

## 2019-05-20 ENCOUNTER — Encounter: Payer: Self-pay | Admitting: Rheumatology

## 2019-05-20 ENCOUNTER — Ambulatory Visit: Payer: 59 | Admitting: Rheumatology

## 2019-05-20 VITALS — BP 110/68 | HR 74 | Resp 12 | Ht 65.0 in | Wt 112.6 lb

## 2019-05-20 DIAGNOSIS — M5136 Other intervertebral disc degeneration, lumbar region: Secondary | ICD-10-CM

## 2019-05-20 DIAGNOSIS — M26609 Unspecified temporomandibular joint disorder, unspecified side: Secondary | ICD-10-CM

## 2019-05-20 DIAGNOSIS — M797 Fibromyalgia: Secondary | ICD-10-CM | POA: Diagnosis not present

## 2019-05-20 DIAGNOSIS — M51369 Other intervertebral disc degeneration, lumbar region without mention of lumbar back pain or lower extremity pain: Secondary | ICD-10-CM

## 2019-05-20 DIAGNOSIS — R233 Spontaneous ecchymoses: Secondary | ICD-10-CM

## 2019-05-20 DIAGNOSIS — M810 Age-related osteoporosis without current pathological fracture: Secondary | ICD-10-CM

## 2019-05-20 DIAGNOSIS — F5101 Primary insomnia: Secondary | ICD-10-CM

## 2019-05-20 DIAGNOSIS — R5383 Other fatigue: Secondary | ICD-10-CM

## 2019-05-20 DIAGNOSIS — Z8719 Personal history of other diseases of the digestive system: Secondary | ICD-10-CM

## 2019-05-20 DIAGNOSIS — M62838 Other muscle spasm: Secondary | ICD-10-CM

## 2019-05-20 DIAGNOSIS — N3010 Interstitial cystitis (chronic) without hematuria: Secondary | ICD-10-CM

## 2019-05-20 DIAGNOSIS — M5030 Other cervical disc degeneration, unspecified cervical region: Secondary | ICD-10-CM

## 2019-05-20 NOTE — Patient Instructions (Signed)

## 2019-05-21 ENCOUNTER — Ambulatory Visit (HOSPITAL_COMMUNITY): Payer: 59

## 2019-05-21 ENCOUNTER — Other Ambulatory Visit: Payer: Self-pay

## 2019-05-21 ENCOUNTER — Encounter (HOSPITAL_COMMUNITY)
Admission: RE | Admit: 2019-05-21 | Discharge: 2019-05-21 | Disposition: A | Discharge status: Home / Self Care | Payer: 59 | Source: Ambulatory Visit | Attending: Pulmonary Disease | Admitting: Pulmonary Disease

## 2019-05-21 DIAGNOSIS — J439 Emphysema, unspecified: Secondary | ICD-10-CM

## 2019-05-21 NOTE — Progress Notes (Signed)
Daily Session Note  Patient Details  Name: Emily Ward MRN: [id number] Date of Birth: 1956-06-12 Referring Provider:     Pulmonary Rehab Walk Test from 04/20/2019 in [place]  Referring Provider  Dr. Vaughan Browner      Encounter Date: 05/21/2019  Check In: Session Check In - 05/21/19 1442      Check-In   Supervising physician immediately available to respond to emergencies  [facility] immediately available    Physician(s)  Dr. Waldron Labs    Location  [facility]    Staff Present  Rosebud Poles, RN, Bjorn Loser, MS, Exercise Physiologist;Juanita Streight Ysidro Evert, RN    Virtual Visit  No    Medication changes reported      No    Fall or balance concerns reported     No    Tobacco Cessation  No Change    Warm-up and Cool-down  Performed on first and last piece of equipment    Resistance Training Performed  Yes    VAD Patient?  No    PAD/SET Patient?  No      Pain Assessment   Currently in Pain?  No/denies    Multiple Pain Sites  No       Capillary Blood Glucose: No results found for this or any previous visit (from the past 24 hour(s)).    Social History   Tobacco Use  Smoking Status Former Smoker  . Packs/day: 1.00  . Years: 20.00  . Pack years: 20.00  . Types: Cigarettes  . Quit date: 04/02/2013  . Years since quitting: 6.1  Smokeless Tobacco Never Used    Goals Met:  Exercise tolerated well No report of cardiac concerns or symptoms Strength training completed today  Goals Unmet:  Not Applicable  Comments: Service time is from 1325 to 1430    Dr. Rush Farmer is Medical Director for Pulmonary Rehab at [facility].

## 2019-05-26 ENCOUNTER — Other Ambulatory Visit: Payer: Self-pay

## 2019-05-26 ENCOUNTER — Ambulatory Visit (HOSPITAL_COMMUNITY): Payer: 59

## 2019-05-26 ENCOUNTER — Encounter (HOSPITAL_COMMUNITY)
Admission: RE | Admit: 2019-05-26 | Discharge: 2019-05-26 | Disposition: A | Discharge status: Home / Self Care | Payer: 59 | Source: Ambulatory Visit | Attending: Pulmonary Disease | Admitting: Pulmonary Disease

## 2019-05-26 VITALS — Wt 111.8 lb

## 2019-05-26 DIAGNOSIS — J439 Emphysema, unspecified: Secondary | ICD-10-CM

## 2019-05-26 NOTE — Progress Notes (Signed)
Daily Session Note  Patient Details  Name: Emily Ward MRN: [id number] Date of Birth: Jun 26, 1956 Referring Provider:     Pulmonary Rehab Walk Test from 04/20/2019 in [facility]  Referring Provider  Dr. Vaughan Browner      Encounter Date: 05/26/2019  Check In: Session Check In - 05/26/19 1428      Check-In   Supervising physician immediately available to respond to emergencies  [facility] immediately available    Physician(s)  Dr. Maren Beach    Location  [facility]    Staff Present  Rosebud Poles, RN, Bjorn Loser, MS, Exercise Physiologist;Lisa Ysidro Evert, RN    Virtual Visit  No    Medication changes reported      No    Fall or balance concerns reported     No    Tobacco Cessation  No Change    Warm-up and Cool-down  Performed on first and last piece of equipment    Resistance Training Performed  Yes    VAD Patient?  No    PAD/SET Patient?  No      Pain Assessment   Currently in Pain?  No/denies    Multiple Pain Sites  No       Capillary Blood Glucose: No results found for this or any previous visit (from the past 24 hour(s)).  Exercise Prescription Changes - 05/26/19 1400      Response to Exercise   Blood Pressure (Admit)  108/70    Blood Pressure (Exercise)  140/74    Blood Pressure (Exit)  104/76    Heart Rate (Admit)  70 bpm    Heart Rate (Exercise)  110 bpm    Heart Rate (Exit)  80 bpm    Oxygen Saturation (Admit)  98 %    Oxygen Saturation (Exercise)  95 %    Oxygen Saturation (Exit)  94 %    Rating of Perceived Exertion (Exercise)  13    Perceived Dyspnea (Exercise)  1    Duration  Continue with 30 min of aerobic exercise without signs/symptoms of physical distress.    Intensity  THRR unchanged      Progression   Progression  Continue to progress workloads to maintain intensity without signs/symptoms of physical distress.      Resistance Training   Training Prescription  Yes    Weight  orange  bands    Reps  10-15    Time  10 Minutes      Bike   Level  3.5    Minutes  15    METs  4.8      NuStep   Level  4    SPM  80    Minutes  15    METs  3.8       Social History   Tobacco Use  Smoking Status Former Smoker  . Packs/day: 1.00  . Years: 20.00  . Pack years: 20.00  . Types: Cigarettes  . Quit date: 04/02/2013  . Years since quitting: 6.1  Smokeless Tobacco Never Used    Goals Met:  Exercise tolerated well  Goals Unmet:  Not Applicable  Comments: Service time is from 1328 to 1432    Dr. Rush Farmer is Medical Director for Pulmonary Rehab at [facility].

## 2019-05-26 NOTE — Progress Notes (Signed)
Pulmonary Individual Treatment Plan  Patient Details  Name: Emily Ward MRN: [id number] Date of Birth: 02-Jun-1956 Referring Provider:     Pulmonary Rehab Walk Test from 04/20/2019 in [place]  Referring Provider  Dr. Vaughan Browner      Initial Encounter Date:    Pulmonary Rehab Walk Test from 04/20/2019 in [place]  Date  04/20/19      Visit Diagnosis: Pulmonary emphysema, unspecified emphysema type ([place])  Patient's Home Medications on Admission:   Current Outpatient Medications:  .  butalbital-acetaminophen-caffeine (FIORICET) 50-325-40 MG tablet, TAKE 1 TABLET BY MOUTH EVERY 6 HOURS AS NEEDED, Disp: 120 tablet, Rfl: 5 .  CALCIUM PO, Take 1 tablet by mouth daily., Disp: , Rfl:  .  cetirizine (ZYRTEC) 10 MG tablet, Take 10 mg by mouth daily as needed for allergies (spring time). , Disp: , Rfl:  .  chlorzoxazone (PARAFON) 500 MG tablet, TAKE 1 TABLET BY MOUTH FOUR TIMES DAILY AS NEEDED FOR MUSCLE SPASMS (Patient taking differently: Take 500 mg by mouth 4 (four) times daily as needed for muscle spasms. ), Disp: 120 tablet, Rfl: 3 .  cholecalciferol (VITAMIN D) 1000 units tablet, Take 2,000 Units by mouth daily. , Disp: , Rfl:  .  co-enzyme Q-10 30 MG capsule, Take 30 mg by mouth daily., Disp: , Rfl:  .  Cyanocobalamin (VITAMIN B-12 PO), Take 1 tablet by mouth daily. , Disp: , Rfl:  .  cyclobenzaprine (FLEXERIL) 10 MG tablet, TAKE 1 TABLET BY MOUTH THREE TIMES DAILY AS NEEDED FOR MUSCLE SPASMS, Disp: 270 tablet, Rfl: 1 .  denosumab (PROLIA) 60 MG/ML SOSY injection, Inject 60 mg into the skin every 6 (six) months., Disp: , Rfl:  .  DULoxetine (CYMBALTA) 30 MG capsule, TAKE 2 CAPSULES BY MOUTH EVERY MORNING, Disp: 180 capsule, Rfl: 0 .  estradiol (ESTRACE) 1 MG tablet, TAKE 1 TABLET(1 MG) BY MOUTH DAILY, Disp: 90 tablet, Rfl: 2 .  meloxicam (MOBIC) 7.5 MG tablet, TAKE 1 TABLET(7.5 MG) BY MOUTH DAILY (Patient taking  differently: Take 7.5 mg by mouth daily as needed for pain. ), Disp: 90 tablet, Rfl: 3 .  terconazole (TERAZOL 7) 0.4 % vaginal cream, Place 1 applicator vaginally at bedtime., Disp: 45 g, Rfl: 0 .  umeclidinium-vilanterol (ANORO ELLIPTA) 62.5-25 MCG/INH AEPB, Inhale 1 puff into the lungs daily., Disp: 60 each, Rfl: 2 .  zolmitriptan (ZOMIG) 5 MG tablet, TAKE 1 TABLET BY MOUTH AS NEEDED, Disp: 30 tablet, Rfl: 5 .  zolpidem (AMBIEN) 10 MG tablet, TAKE 1 TABLET BY MOUTH EVERY NIGHT AT BEDTIME, Disp: 90 tablet, Rfl: 1  Past Medical History: Past Medical History:  Diagnosis Date  . Cervical disc disease 2006  . COPD (chronic obstructive pulmonary disease) ([place])   . Emphysema lung ([place])   . 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    . Fibromyalgia    sees Dr. Estanislado Pandy   . Heart murmur   . 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 Hyperlipidemia   . Migraines   . MVP (mitral valve prolapse)    no daily medication  . Peptic ulcer    no problems    Tobacco Use: Social History   Tobacco Use  Smoking Status Former Smoker  . Packs/day: 1.00  . Years: 20.00  . Pack years: 20.00  . Types: Cigarettes  . Quit date: 04/02/2013  . Years since quitting: 6.1  Smokeless  Tobacco Never Used    Labs: Recent Review Flowsheet Data    Labs for ITP Cardiac and Pulmonary Rehab Latest Ref Rng & Units 04/09/2014 04/21/2015 05/11/2016 07/12/2017 07/18/2018   Cholestrol 0 - 200 mg/dL 182 201(H) 218(H) 192 197   LDLCALC 0 - 99 mg/dL 104(H) 113(H) 130(H) 112(H) 114(H)   LDLDIRECT mg/dL - - - - -   HDL >39.00 mg/dL 51.60 66.20 74.40 65.70 72.10   Trlycerides 0.0 - 149.0 mg/dL 130.0 107.0 70.0 76.0 53.0      Capillary Blood Glucose: Lab Results  Component Value Date   GLUCAP 85 10/22/2018   GLUCAP 165 (H) 09/01/2018     Pulmonary Assessment Scores: Pulmonary Assessment Scores    Row Name 04/20/19 1132 04/20/19 1212       ADL UCSD   ADL Phase  Entry   Entry    SOB Score total  32  -      CAT Score   CAT Score  pre 14  -      mMRC Score   mMRC Score  -  2      UCSD: Self-administered rating of dyspnea associated with activities of daily living (ADLs) 6-point scale (0 = "not at all" to 5 = "maximal or unable to do because of breathlessness")  Scoring Scores range from 0 to 120.  Minimally important difference is 5 units  CAT: CAT can identify the health impairment of COPD patients and is better correlated with disease progression.  CAT has a scoring range of zero to 40. The CAT score is classified into four groups of low (less than 10), medium (10 - 20), high (21-30) and very high (31-40) based on the impact level of disease on health status. A CAT score over 10 suggests significant symptoms.  A worsening CAT score could be explained by an exacerbation, poor medication adherence, poor inhaler technique, or progression of COPD or comorbid conditions.  CAT MCID is 2 points  mMRC: mMRC (Modified Medical Research Council) Dyspnea Scale is used to assess the degree of baseline functional disability in patients of respiratory disease due to dyspnea. No minimal important difference is established. A decrease in score of 1 point or greater is considered a positive change.   Pulmonary Function Assessment: Pulmonary Function Assessment - 04/20/19 1132      Breath   Bilateral Breath Sounds  Clear    Shortness of Breath  Limiting activity;Fear of Shortness of Breath       Exercise Target Goals: Exercise Program Goal: Individual exercise prescription set using results from initial 6 min walk test and THRR while considering  patient's activity barriers and safety.   Exercise Prescription Goal: Initial exercise prescription builds to 30-45 minutes a day of aerobic activity, 2-3 days per week.  Home exercise guidelines will be given to patient during program as part of exercise prescription that the participant will acknowledge.  Activity  Barriers & Risk Stratification: Activity Barriers & Cardiac Risk Stratification - 04/20/19 1117      Activity Barriers & Cardiac Risk Stratification   Activity Barriers  Fibromyalgia       6 Minute Walk: 6 Minute Walk    Row Name 04/20/19 1154         6 Minute Walk   Phase  Initial     Distance  1674 feet     Walk Time  6 minutes     # of Rest Breaks  0     MPH  3.17  METS  3.45     RPE  12     Perceived Dyspnea   2     VO2 Peak  15.33     Symptoms  No     Resting HR  81 bpm     Resting BP  118/64     Resting Oxygen Saturation   99 %     Exercise Oxygen Saturation  during 6 min walk  89 %     Max Ex. HR  105 bpm     Max Ex. BP  124/62     2 Minute Post BP  112/68       Interval HR   1 Minute HR  87     2 Minute HR  94     3 Minute HR  98     4 Minute HR  99     5 Minute HR  106     6 Minute HR  105     2 Minute Post HR  76     Interval Heart Rate?  Yes       Interval Oxygen   Interval Oxygen?  Yes     Baseline Oxygen Saturation %  99 %     1 Minute Oxygen Saturation %  94 %     1 Minute Liters of Oxygen  0 L     2 Minute Oxygen Saturation %  92 %     2 Minute Liters of Oxygen  0 L     3 Minute Oxygen Saturation %  91 %     3 Minute Liters of Oxygen  0 L     4 Minute Oxygen Saturation %  92 %     4 Minute Liters of Oxygen  0 L     5 Minute Oxygen Saturation %  89 %     5 Minute Liters of Oxygen  0 L     6 Minute Oxygen Saturation %  89 %     6 Minute Liters of Oxygen  0 L     2 Minute Post Oxygen Saturation %  97 %     2 Minute Post Liters of Oxygen  0 L        Oxygen Initial Assessment: Oxygen Initial Assessment - 04/20/19 1154      Home Oxygen   Home Oxygen Device  None    Sleep Oxygen Prescription  None    Home Exercise Oxygen Prescription  None    Home at Rest Exercise Oxygen Prescription  None      Initial 6 min Walk   Oxygen Used  None      Program Oxygen Prescription   Program Oxygen Prescription  None      Intervention   Short  Term Goals  To learn and exhibit compliance with exercise, home and travel O2 prescription;To learn and understand importance of monitoring SPO2 with pulse oximeter and demonstrate accurate use of the pulse oximeter.;To learn and understand importance of maintaining oxygen saturations>88%;To learn and demonstrate proper pursed lip breathing techniques or other breathing techniques.;To learn and demonstrate proper use of respiratory medications    Long  Term Goals  Compliance with respiratory medication;Exhibits proper breathing techniques, such as pursed lip breathing or other method taught during program session;Maintenance of O2 saturations>88%;Verbalizes importance of monitoring SPO2 with pulse oximeter and return demonstration;Exhibits compliance with exercise, home and travel O2 prescription       Oxygen Re-Evaluation: Oxygen Re-Evaluation  [place] Name 04/28/19 1500 05/25/19 0852           Program Oxygen Prescription   Program Oxygen Prescription  None  None        Home Oxygen   Home Oxygen Device  None  None      Sleep Oxygen Prescription  None  None      Home Exercise Oxygen Prescription  None  None      Home at Rest Exercise Oxygen Prescription  None  None      Compliance with Home Oxygen Use  -  Yes        Goals/Expected Outcomes   Short Term Goals  To learn and exhibit compliance with exercise, home and travel O2 prescription;To learn and understand importance of monitoring SPO2 with pulse oximeter and demonstrate accurate use of the pulse oximeter.;To learn and understand importance of maintaining oxygen saturations>88%;To learn and demonstrate proper pursed lip breathing techniques or other breathing techniques.;To learn and demonstrate proper use of respiratory medications  To learn and exhibit compliance with exercise, home and travel O2 prescription;To learn and understand importance of monitoring SPO2 with pulse oximeter and demonstrate accurate use of the pulse oximeter.;To  learn and understand importance of maintaining oxygen saturations>88%;To learn and demonstrate proper pursed lip breathing techniques or other breathing techniques.;To learn and demonstrate proper use of respiratory medications      Long  Term Goals  Exhibits compliance with exercise, home and travel O2 prescription;Verbalizes importance of monitoring SPO2 with pulse oximeter and return demonstration;Maintenance of O2 saturations>88%;Exhibits proper breathing techniques, such as pursed lip breathing or other method taught during program session;Compliance with respiratory medication  Exhibits compliance with exercise, home and travel O2 prescription;Verbalizes importance of monitoring SPO2 with pulse oximeter and return demonstration;Maintenance of O2 saturations>88%;Exhibits proper breathing techniques, such as pursed lip breathing or other method taught during program session;Compliance with respiratory medication      Goals/Expected Outcomes  compliance  compliance         Oxygen Discharge (Final Oxygen Re-Evaluation): Oxygen Re-Evaluation - 05/25/19 0852      Program Oxygen Prescription   Program Oxygen Prescription  None      Home Oxygen   Home Oxygen Device  None    Sleep Oxygen Prescription  None    Home Exercise Oxygen Prescription  None    Home at Rest Exercise Oxygen Prescription  None    Compliance with Home Oxygen Use  Yes      Goals/Expected Outcomes   Short Term Goals  To learn and exhibit compliance with exercise, home and travel O2 prescription;To learn and understand importance of monitoring SPO2 with pulse oximeter and demonstrate accurate use of the pulse oximeter.;To learn and understand importance of maintaining oxygen saturations>88%;To learn and demonstrate proper pursed lip breathing techniques or other breathing techniques.;To learn and demonstrate proper use of respiratory medications    Long  Term Goals  Exhibits compliance with exercise, home and travel O2  prescription;Verbalizes importance of monitoring SPO2 with pulse oximeter and return demonstration;Maintenance of O2 saturations>88%;Exhibits proper breathing techniques, such as pursed lip breathing or other method taught during program session;Compliance with respiratory medication    Goals/Expected Outcomes  compliance       Initial Exercise Prescription: Initial Exercise Prescription - 04/20/19 1200      Date of Initial Exercise RX and Referring Provider   Date  04/20/19    Referring Provider  Dr. Vaughan Browner      Bike   Level  2  Minutes  15      NuStep   Level  3    SPM  80    METs  15      Prescription Details   Frequency (times per week)  2    Duration  Progress to 30 minutes of continuous aerobic without signs/symptoms of physical distress      Intensity   THRR 40-80% of Max Heartrate  64-128    Ratings of Perceived Exertion  11-13    Perceived Dyspnea  0-4      Progression   Progression  Continue to progress workloads to maintain intensity without signs/symptoms of physical distress.      Resistance Training   Training Prescription  Yes    Weight  orange bands    Reps  10-15       Perform Capillary Blood Glucose checks as needed.  Exercise Prescription Changes: Exercise Prescription Changes    Row Name 04/28/19 1500 05/07/19 1500 05/12/19 1600         Response to Exercise   Blood Pressure (Admit)  110/70  -  110/66     Blood Pressure (Exercise)  130/80  -  128/78     Blood Pressure (Exit)  112/64  -  100/62     Heart Rate (Admit)  89 bpm  -  82 bpm     Heart Rate (Exercise)  116 bpm  -  107 bpm     Heart Rate (Exit)  85 bpm  -  88 bpm     Oxygen Saturation (Admit)  100 %  -  95 %     Oxygen Saturation (Exercise)  95 %  -  91 %     Oxygen Saturation (Exit)  99 %  -  96 %     Rating of Perceived Exertion (Exercise)  11  -  12     Perceived Dyspnea (Exercise)  1  -  1     Duration  Continue with 30 min of aerobic exercise without signs/symptoms of  physical distress.  -  Continue with 30 min of aerobic exercise without signs/symptoms of physical distress.     Intensity  THRR unchanged  -  THRR unchanged       Progression   Progression  Continue to progress workloads to maintain intensity without signs/symptoms of physical distress.  -  Continue to progress workloads to maintain intensity without signs/symptoms of physical distress.       Resistance Training   Training Prescription  Yes  -  Yes     Weight  orange bands  -  orange bands     Reps  10-15  -  10-15     Time  10 Minutes  -  10 Minutes       Bike   Level  2  -  2     Minutes  15  -  15     METs  -  -  3.9       NuStep   Level  3  -  3     SPM  80  -  80     Minutes  -  -  15     METs  15  -  2.6       Home Exercise Plan   Plans to continue exercise at  -  Home (comment)  -     Frequency  -  Add 3 additional days to program exercise sessions.  -  Initial Home Exercises Provided  -  05/07/19  -        Exercise Comments: Exercise Comments    Row Name 05/07/19 1501           Exercise Comments  home exercise complete          Exercise Goals and Review: Exercise Goals    Row Name 05/25/19 0853             Exercise Goals   Increase Physical Activity  Yes       Intervention  Provide advice, education, support and counseling about physical activity/exercise needs.;Develop an individualized exercise prescription for aerobic and resistive training based on initial evaluation findings, risk stratification, comorbidities and participant's personal goals.       Expected Outcomes  Short Term: Attend rehab on a regular basis to increase amount of physical activity.;Long Term: Add in home exercise to make exercise part of routine and to increase amount of physical activity.;Long Term: Exercising regularly at least 3-5 days a week.       Increase Strength and Stamina  Yes       Intervention  Provide advice, education, support and counseling about physical  activity/exercise needs.;Develop an individualized exercise prescription for aerobic and resistive training based on initial evaluation findings, risk stratification, comorbidities and participant's personal goals.       Expected Outcomes  Short Term: Increase workloads from initial exercise prescription for resistance, speed, and METs.;Short Term: Perform resistance training exercises routinely during rehab and add in resistance training at home;Long Term: Improve cardiorespiratory fitness, muscular endurance and strength as measured by increased METs and functional capacity (6MWT)       Able to understand and use rate of perceived exertion (RPE) scale  Yes       Intervention  Provide education and explanation on how to use RPE scale       Expected Outcomes  Short Term: Able to use RPE daily in rehab to express subjective intensity level;Long Term:  Able to use RPE to guide intensity level when exercising independently       Able to understand and use Dyspnea scale  Yes       Intervention  Provide education and explanation on how to use Dyspnea scale       Expected Outcomes  Short Term: Able to use Dyspnea scale daily in rehab to express subjective sense of shortness of breath during exertion;Long Term: Able to use Dyspnea scale to guide intensity level when exercising independently       Knowledge and understanding of Target Heart Rate Range (THRR)  Yes       Expected Outcomes  Short Term: Able to state/look up THRR;Short Term: Able to use daily as guideline for intensity in rehab;Long Term: Able to use THRR to govern intensity when exercising independently       Understanding of Exercise Prescription  Yes       Intervention  Provide education, explanation, and written materials on patient's individual exercise prescription       Expected Outcomes  Short Term: Able to explain program exercise prescription;Long Term: Able to explain home exercise prescription to exercise independently           Exercise Goals Re-Evaluation : Exercise Goals Re-Evaluation    Row Name 04/28/19 1504 05/25/19 0857           Exercise Goal Re-Evaluation   Exercise Goals Review  Increase Physical Activity;Increase Strength and Stamina;Able to understand and use rate of  perceived exertion (RPE) scale;Able to understand and use Dyspnea scale;Knowledge and understanding of Target Heart Rate Range (THRR);Understanding of Exercise Prescription  Increase Physical Activity;Increase Strength and Stamina;Able to understand and use rate of perceived exertion (RPE) scale;Able to understand and use Dyspnea scale;Knowledge and understanding of Target Heart Rate Range (THRR);Understanding of Exercise Prescription      Comments  Pt just completed her first exercise session. Pt exercised at 2.5 METs on the stepper. Will monitor and progress as able.  Pt has completed 8 exercise sessions. Pt is highly motivated and eager for workload increases. Pt exercises at 3.8 METs on the stepper. Pt is currently looking for an exercise bike so that she can continue her exercise at home. Will continue to monitor and progress as able.      Expected Outcomes  Through exercise at rehab and at home, the patient will decrease shortness of breath with daily activities and feel confident in carrying out an exercse regime at home.  Through exercise at rehab and at home, the patient will decrease shortness of breath with daily activities and feel confident in carrying out an exercse regime at home.         Discharge Exercise Prescription (Final Exercise Prescription Changes): Exercise Prescription Changes - 05/12/19 1600      Response to Exercise   Blood Pressure (Admit)  110/66    Blood Pressure (Exercise)  128/78    Blood Pressure (Exit)  100/62    Heart Rate (Admit)  82 bpm    Heart Rate (Exercise)  107 bpm    Heart Rate (Exit)  88 bpm    Oxygen Saturation (Admit)  95 %    Oxygen Saturation (Exercise)  91 %    Oxygen Saturation (Exit)   96 %    Rating of Perceived Exertion (Exercise)  12    Perceived Dyspnea (Exercise)  1    Duration  Continue with 30 min of aerobic exercise without signs/symptoms of physical distress.    Intensity  THRR unchanged      Progression   Progression  Continue to progress workloads to maintain intensity without signs/symptoms of physical distress.      Resistance Training   Training Prescription  Yes    Weight  orange bands    Reps  10-15    Time  10 Minutes      Bike   Level  2    Minutes  15    METs  3.9      NuStep   Level  3    SPM  80    Minutes  15    METs  2.6       Nutrition:  Target Goals: Understanding of nutrition guidelines, daily intake of sodium 1500mg , cholesterol 200mg , calories 30% from fat and 7% or less from saturated fats, daily to have 5 or more servings of fruits and vegetables.  Biometrics: Pre Biometrics - 04/20/19 1118      Pre Biometrics   Height  5\' 5"  (1.651 m)    Weight  50.1 kg    BMI (Calculated)  18.38    Grip Strength  15 kg        Nutrition Therapy Plan and Nutrition Goals:   Nutrition Assessments:   Nutrition Goals Re-Evaluation:   Nutrition Goals Discharge (Final Nutrition Goals Re-Evaluation):   Psychosocial: Target Goals: Acknowledge presence or absence of significant depression and/or stress, maximize coping skills, provide positive support system. Participant is able to verbalize types and ability  to use techniques and skills needed for reducing stress and depression.  Initial Review & Psychosocial Screening: Initial Psych Review & Screening - 04/20/19 1134      Initial Review   Current issues with  Current Sleep Concerns      Family Dynamics   Good Support System?  Yes      Barriers   Psychosocial barriers to participate in program  There are no identifiable barriers or psychosocial needs.      Screening Interventions   Interventions  Encouraged to exercise    Expected Outcomes  Long Term Goal: Stressors or  current issues are controlled or eliminated.;Long Term goal: The participant improves quality of Life and PHQ9 Scores as seen by post scores and/or verbalization of changes       Quality of Life Scores:  Scores of 19 and below usually indicate a poorer quality of life in these areas.  A difference of  2-3 points is a clinically meaningful difference.  A difference of 2-3 points in the total score of the Quality of Life Index has been associated with significant improvement in overall quality of life, self-image, physical symptoms, and general health in studies assessing change in quality of life.  PHQ-9: Recent Review Flowsheet Data    Depression screen [facility] 2/9 04/20/2019 07/18/2018   Decreased Interest 0 0   Down, Depressed, Hopeless 0 -   PHQ - 2 Score 0 0   Altered sleeping 3 -   Tired, decreased energy 1 -   Change in appetite 0 -   Feeling bad or failure about yourself  0 -   Trouble concentrating 3 -   Moving slowly or fidgety/restless 0 -   Suicidal thoughts 0 -   PHQ-9 Score 7 -   Difficult doing work/chores Not difficult at all -     Interpretation of Total Score  Total Score Depression Severity:  1-4 = Minimal depression, 5-9 = Mild depression, 10-14 = Moderate depression, 15-19 = Moderately severe depression, 20-27 = Severe depression   Psychosocial Evaluation and Intervention: Psychosocial Evaluation - 04/20/19 1135      Psychosocial Evaluation & Interventions   Interventions  Relaxation education;Encouraged to exercise with the program and follow exercise prescription    Comments  in hopes exercise will improve sleeping habits and improve quality of life.    Expected Outcomes  No barriers to participation in pulmonary rehab    Continue Psychosocial Services   Follow up required by staff       Psychosocial Re-Evaluation: Psychosocial Re-Evaluation    [place] Name 04/20/19 1137 04/28/19 1550 05/25/19 1413         Psychosocial Re-Evaluation   Current issues with   Current Sleep Concerns  Current Sleep Concerns  None Identified     Comments  -  Just started program today, too early to see if exercise improves sleep disturbances  No barriers or psychosocial concerns identified.     Expected Outcomes  -  No barriers to participation in pulmonary rehab  Will continue to have no barriers or psychosocial concerns while participating in pulmonary rehab.     Interventions  -  Encouraged to attend Pulmonary Rehabilitation for the exercise  Encouraged to attend Pulmonary Rehabilitation for the exercise     Continue Psychosocial Services   -  Follow up required by staff  No Follow up required        Psychosocial Discharge (Final Psychosocial Re-Evaluation): Psychosocial Re-Evaluation - 05/25/19 1413  Psychosocial Re-Evaluation   Current issues with  None Identified    Comments  No barriers or psychosocial concerns identified.    Expected Outcomes  Will continue to have no barriers or psychosocial concerns while participating in pulmonary rehab.    Interventions  Encouraged to attend Pulmonary Rehabilitation for the exercise    Continue Psychosocial Services   No Follow up required       Education: Education Goals: Education classes will be provided on a weekly basis, covering required topics. Participant will state understanding/return demonstration of topics presented.  Learning Barriers/Preferences: Learning Barriers/Preferences - 04/20/19 1138      Learning Barriers/Preferences   Learning Barriers  None    Learning Preferences  Audio;Computer/Internet;Group Instruction;Individual Instruction;Pictoral;Skilled Demonstration;Verbal Instruction;Video;Written Material       Education Topics: Risk Factor Reduction:  -Group instruction that is supported by a PowerPoint presentation. Instructor discusses the definition of a risk factor, different risk factors for pulmonary disease, and how the heart and lungs work together.     Nutrition for Pulmonary  Patient:  -Group instruction provided by PowerPoint slides, verbal discussion, and written materials to support subject matter. The instructor gives an explanation and review of healthy diet recommendations, which includes a discussion on weight management, recommendations for fruit and vegetable consumption, as well as protein, fluid, caffeine, fiber, sodium, sugar, and alcohol. Tips for eating when patients are short of breath are discussed.   Pursed Lip Breathing:  -Group instruction that is supported by demonstration and informational handouts. Instructor discusses the benefits of pursed lip and diaphragmatic breathing and detailed demonstration on how to preform both.     Oxygen Safety:  -Group instruction provided by PowerPoint, verbal discussion, and written material to support subject matter. There is an overview of "What is Oxygen" and "Why do we need it".  Instructor also reviews how to create a safe environment for oxygen use, the importance of using oxygen as prescribed, and the risks of noncompliance. There is a brief discussion on traveling with oxygen and resources the patient may utilize.   Oxygen Equipment:  -Group instruction provided by [facility] Staff utilizing handouts, written materials, and equipment demonstrations.   Signs and Symptoms:  -Group instruction provided by written material and verbal discussion to support subject matter. Warning signs and symptoms of infection, stroke, and heart attack are reviewed and when to call the physician/911 reinforced. Tips for preventing the spread of infection discussed.   Advanced Directives:  -Group instruction provided by verbal instruction and written material to support subject matter. Instructor reviews Advanced Directive laws and proper instruction for filling out document.   Pulmonary Video:  -Group video education that reviews the importance of medication and oxygen compliance, exercise, good nutrition, pulmonary  hygiene, and pursed lip and diaphragmatic breathing for the pulmonary patient.   Exercise for the Pulmonary Patient:  -Group instruction that is supported by a PowerPoint presentation. Instructor discusses benefits of exercise, core components of exercise, frequency, duration, and intensity of an exercise routine, importance of utilizing pulse oximetry during exercise, safety while exercising, and options of places to exercise outside of rehab.     Pulmonary Medications:  -Verbally interactive group education provided by instructor with focus on inhaled medications and proper administration.   Anatomy and Physiology of the Respiratory System and Intimacy:  -Group instruction provided by PowerPoint, verbal discussion, and written material to support subject matter. Instructor reviews respiratory cycle and anatomical components of the respiratory system and their functions. Instructor also reviews differences in obstructive  and restrictive respiratory diseases with examples of each. Intimacy, Sex, and Sexuality differences are reviewed with a discussion on how relationships can change when diagnosed with pulmonary disease. Common sexual concerns are reviewed.   MD DAY -A group question and answer session with a medical doctor that allows participants to ask questions that relate to their pulmonary disease state.   OTHER EDUCATION -Group or individual verbal, written, or video instructions that support the educational goals of the pulmonary rehab program.   Holiday Eating Survival Tips:  -Group instruction provided by PowerPoint slides, verbal discussion, and written materials to support subject matter. The instructor gives patients tips, tricks, and techniques to help them not only survive but enjoy the holidays despite the onslaught of food that accompanies the holidays.   Knowledge Questionnaire Score: Knowledge Questionnaire Score - 04/20/19 1134      Knowledge Questionnaire Score    Pre Score  16/18  pre       Core Components/Risk Factors/Patient Goals at Admission: Personal Goals and Risk Factors at Admission - 04/20/19 1138      Core Components/Risk Factors/Patient Goals on Admission   Improve shortness of breath with ADL's  Yes    Intervention  Provide education, individualized exercise plan and daily activity instruction to help decrease symptoms of SOB with activities of daily living.    Expected Outcomes  Short Term: Improve cardiorespiratory fitness to achieve a reduction of symptoms when performing ADLs;Long Term: Be able to perform more ADLs without symptoms or delay the onset of symptoms       Core Components/Risk Factors/Patient Goals Review:  Goals and Risk Factor Review    Row Name 04/20/19 1138 04/28/19 1552 05/25/19 1414         Core Components/Risk Factors/Patient Goals Review   Personal Goals Review  Increase knowledge of respiratory medications and ability to use respiratory devices properly.;Improve shortness of breath with ADL's;Develop more efficient breathing techniques such as purse lipped breathing and diaphragmatic breathing and practicing self-pacing with activity.  Increase knowledge of respiratory medications and ability to use respiratory devices properly.;Improve shortness of breath with ADL's;Develop more efficient breathing techniques such as purse lipped breathing and diaphragmatic breathing and practicing self-pacing with activity.  Increase knowledge of respiratory medications and ability to use respiratory devices properly.;Improve shortness of breath with ADL's;Develop more efficient breathing techniques such as purse lipped breathing and diaphragmatic breathing and practicing self-pacing with activity.     Review  -  Just started program today, too early to see progress towards program goals  Has attended 8 exercise sessions, she is tolerating workload progression well, very positive, seems to enjoy the program.  Able to demonstrate  purse lip breathing and is self sufficient on exercise equipment.     Expected Outcomes  -  see admission goals  See admission goals.        Core Components/Risk Factors/Patient Goals at Discharge (Final Review):  Goals and Risk Factor Review - 05/25/19 1414      Core Components/Risk Factors/Patient Goals Review   Personal Goals Review  Increase knowledge of respiratory medications and ability to use respiratory devices properly.;Improve shortness of breath with ADL's;Develop more efficient breathing techniques such as purse lipped breathing and diaphragmatic breathing and practicing self-pacing with activity.    Review  Has attended 8 exercise sessions, she is tolerating workload progression well, very positive, seems to enjoy the program.  Able to demonstrate purse lip breathing and is self sufficient on exercise equipment.    Expected Outcomes  See admission goals.       ITP Comments:   Comments: ITP REVIEW Pt is making expected progress toward pulmonary rehab goals after completing 8 sessions. Recommend continued exercise, life style modification, education, and utilization of breathing techniques to increase stamina and strength and decrease shortness of breath with exertion.

## 2019-05-28 ENCOUNTER — Telehealth: Payer: Self-pay | Admitting: Pulmonary Disease

## 2019-05-28 ENCOUNTER — Ambulatory Visit (HOSPITAL_COMMUNITY): Payer: 59

## 2019-05-28 ENCOUNTER — Encounter (HOSPITAL_COMMUNITY)
Admission: RE | Admit: 2019-05-28 | Discharge: 2019-05-28 | Disposition: A | Discharge status: Home / Self Care | Payer: 59 | Source: Ambulatory Visit | Attending: Pulmonary Disease | Admitting: Pulmonary Disease

## 2019-05-28 ENCOUNTER — Other Ambulatory Visit: Payer: Self-pay

## 2019-05-28 DIAGNOSIS — J439 Emphysema, unspecified: Secondary | ICD-10-CM

## 2019-05-28 MED ORDER — PROAIR RESPICLICK 108 (90 BASE) MCG/ACT IN AEPB: 2.0000 | INHALATION_SPRAY | Freq: Four times a day (QID) | RESPIRATORY_TRACT | 5 refills | Status: DC | PRN

## 2019-05-28 NOTE — Progress Notes (Signed)
Daily Session Note  Patient Details  Name: Emily Ward MRN: [id number] Date of Birth: 18-Apr-1956 Referring Provider:     Pulmonary Rehab Walk Test from 04/20/2019 in [place]  Referring Provider  Dr. Vaughan Browner      Encounter Date: 05/28/2019  Check In: Session Check In - 05/28/19 1523      Check-In   Supervising physician immediately available to respond to emergencies  [facility] immediately available    Physician(s)  Dr. [place]    Location  [facility]    Staff Present  Rosebud Poles, RN, BSN;Marwan Lipe Ysidro Evert, RN;Carlette Wilber Oliphant, RN, BSN;Ramon Dredge, RN, MHA    Virtual Visit  No    Medication changes reported      No    Fall or balance concerns reported     No    Tobacco Cessation  No Change    Warm-up and Cool-down  Performed on first and last piece of equipment    Resistance Training Performed  Yes    VAD Patient?  No    PAD/SET Patient?  No      Pain Assessment   Currently in Pain?  No/denies    Multiple Pain Sites  No       Capillary Blood Glucose: No results found for this or any previous visit (from the past 24 hour(s)).    Social History   Tobacco Use  Smoking Status Former Smoker  . Packs/day: 1.00  . Years: 20.00  . Pack years: 20.00  . Types: Cigarettes  . Quit date: 04/02/2013  . Years since quitting: 6.1  Smokeless Tobacco Never Used    Goals Met:  Exercise tolerated well No report of cardiac concerns or symptoms Strength training completed today  Goals Unmet:  Not Applicable  Comments: Service time is from 1330 to 1430    Dr. Rush Farmer is Medical Director for Pulmonary Rehab at [facility].

## 2019-05-28 NOTE — Telephone Encounter (Signed)
Call returned to patient, requesting refills of albuterol respiclick. Confirmed pharmacy, refill sent.   She reports she is requesting recommendations for work. 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 She reports she could do desk duty or  work from home.   She is requesting a letter stating she is high risk and should be given desk duty and work from home. Aware she will not get a call back until tomorrow due to it being after 5 and the matter not being urgent. Voiced understanding.   PM please advise of letter. Thanks.

## 2019-05-29 ENCOUNTER — Encounter: Payer: Self-pay | Admitting: General Surgery

## 2019-05-29 NOTE — Telephone Encounter (Signed)
Pt would like to speak to the nurse about her letter

## 2019-05-29 NOTE — Telephone Encounter (Signed)
Letter faxed.Once confirmation has been received it will be mailed to the patient. Nothing further needed at this time.

## 2019-05-29 NOTE — Telephone Encounter (Signed)
Okay to give letter stating that she is at high risk of respiratory complications given her lung issues If strict CDC guidelines cannot be followed for precautions then we would advise her to work from home

## 2019-05-29 NOTE — Telephone Encounter (Signed)
Message previously routed to Dr. Mannam. 

## 2019-05-29 NOTE — Telephone Encounter (Signed)
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  Patient stated next week is okay to receive adjusted letter.  Dr Vaughan Browner please advise, thank you.

## 2019-05-29 NOTE — Telephone Encounter (Signed)
Called the patient to let her know Dr. Vaughan Browner agreed to the letter. She confirmed it needs to be sent to:  [facility] Attn: Laurice Record Fax [phone]  I confirmed she has mychart access and once the fax confirmation has been received it will be mailed to her for her records. Patient voiced understanding.

## 2019-06-01 NOTE — Telephone Encounter (Signed)
Left message for patient to call back  

## 2019-06-01 NOTE — Telephone Encounter (Signed)
Pt calling back about letter faxed to work for her. She was just informed that the cover sheet was the only thing that came to them. Please refax to [phone]. Pt said if she needs to come and pick up note she will. Just call and let her know please

## 2019-06-01 NOTE — Telephone Encounter (Signed)
Dr. Vaughan Browner will be back in office 09/29. Will await recommendations.

## 2019-06-02 ENCOUNTER — Encounter (HOSPITAL_COMMUNITY)
Admission: RE | Admit: 2019-06-02 | Discharge: 2019-06-02 | Disposition: A | Discharge status: Home / Self Care | Payer: 59 | Source: Ambulatory Visit | Attending: Pulmonary Disease | Admitting: Pulmonary Disease

## 2019-06-02 ENCOUNTER — Ambulatory Visit (HOSPITAL_COMMUNITY): Payer: 59

## 2019-06-02 ENCOUNTER — Other Ambulatory Visit: Payer: Self-pay

## 2019-06-02 DIAGNOSIS — J439 Emphysema, unspecified: Secondary | ICD-10-CM | POA: Diagnosis not present

## 2019-06-02 NOTE — Progress Notes (Signed)
Daily Session Note  Patient Details  Name: Emily Ward MRN: [id number] Date of Birth: [birth year] Referring Provider:     Pulmonary Rehab Walk Test from 04/20/2019 in [name]  Referring Provider  Dr. Vaughan Browner      Encounter Date: 06/02/2019  Check In: Session Check In - 06/02/19 1526      Check-In   Supervising physician immediately available to respond to emergencies  [facility] immediately available    Physician(s)  Dr. [place] Crane    Location  [facility]    Staff Present  Emily Poles, RN, Emily Loser, MS, Exercise Physiologist;Emily Ward Emily Evert, RN    Virtual Visit  No    Medication changes reported      No    Fall or balance concerns reported     No    Tobacco Cessation  No Change    Warm-up and Cool-down  Performed on first and last piece of equipment    Resistance Training Performed  Yes    VAD Patient?  No    PAD/SET Patient?  No      Pain Assessment   Currently in Pain?  No/denies    Multiple Pain Sites  No       Capillary Blood Glucose: No results found for this or any previous visit (from the past 24 hour(s)).    Social History   Tobacco Use  Smoking Status Former Smoker  . Packs/day: 1.00  . Years: 20.00  . Pack years: 20.00  . Types: Cigarettes  . Quit date: 04/02/2013  . Years since quitting: 6.1  Smokeless Tobacco Never Used    Goals Met:  Exercise tolerated well No report of cardiac concerns or symptoms Strength training completed today  Goals Unmet:  Not Applicable  Comments: Service time is from 1330 to 62    Dr. Rush Farmer is Medical Director for Pulmonary Rehab at [facility].

## 2019-06-02 NOTE — Telephone Encounter (Signed)
Called patient to her know that each time the fax was sent the confirmation came back with "communication error", the patient agreed for the letter to be mailed to her.  Nothing further needed at this time.

## 2019-06-02 NOTE — Telephone Encounter (Signed)
Called and spoke with Patient.  Patient stated [facility] Department only received cover sheet from fax. Letter faxed to [facility] Department, attn [place]. Nothing further at this time.

## 2019-06-04 ENCOUNTER — Ambulatory Visit (HOSPITAL_COMMUNITY): Payer: 59

## 2019-06-04 ENCOUNTER — Encounter (HOSPITAL_COMMUNITY): Payer: 59

## 2019-06-04 ENCOUNTER — Telehealth (HOSPITAL_COMMUNITY): Payer: Self-pay | Admitting: Family Medicine

## 2019-06-09 ENCOUNTER — Ambulatory Visit (HOSPITAL_COMMUNITY): Payer: 59

## 2019-06-09 ENCOUNTER — Encounter (HOSPITAL_COMMUNITY)
Admission: RE | Admit: 2019-06-09 | Discharge: 2019-06-09 | Disposition: A | Discharge status: Home / Self Care | Payer: 59 | Source: Ambulatory Visit | Attending: Pulmonary Disease | Admitting: Pulmonary Disease

## 2019-06-09 ENCOUNTER — Other Ambulatory Visit: Payer: Self-pay

## 2019-06-09 VITALS — Wt 111.6 lb

## 2019-06-09 DIAGNOSIS — J439 Emphysema, unspecified: Secondary | ICD-10-CM

## 2019-06-09 NOTE — Progress Notes (Signed)
Daily Session Note  Patient Details  Name: Emily Ward MRN: [id number] Date of Birth: 25-Aug-1956 Referring Provider:     Pulmonary Rehab Walk Test from 04/20/2019 in [place]  Referring Provider  Dr. Vaughan Browner      Encounter Date: 06/09/2019  Check In: Session Check In - 06/09/19 1634      Check-In   Supervising physician immediately available to respond to emergencies  [facility] immediately available    Physician(s)  Dr. Benny Lennert    Location  [facility]    Staff Present  Rosebud Poles, RN, Bjorn Loser, MS, Exercise Physiologist;Annedrea Rosezella [place], RN, MHA    Virtual Visit  No    Medication changes reported      No    Fall or balance concerns reported     No    Tobacco Cessation  No Change    Warm-up and Cool-down  Performed on first and last piece of equipment    Resistance Training Performed  Yes    VAD Patient?  No    PAD/SET Patient?  No      Pain Assessment   Currently in Pain?  No/denies    Multiple Pain Sites  No       Capillary Blood Glucose: No results found for this or any previous visit (from the past 24 hour(s)).  Exercise Prescription Changes - 06/09/19 1600      Response to Exercise   Blood Pressure (Admit)  104/56    Blood Pressure (Exercise)  142/78    Blood Pressure (Exit)  110/66    Heart Rate (Admit)  76 bpm    Heart Rate (Exercise)  119 bpm    Heart Rate (Exit)  85 bpm    Oxygen Saturation (Admit)  96 %    Oxygen Saturation (Exercise)  91 %    Oxygen Saturation (Exit)  92 %    Rating of Perceived Exertion (Exercise)  12    Perceived Dyspnea (Exercise)  3    Duration  Continue with 30 min of aerobic exercise without signs/symptoms of physical distress.    Intensity  THRR unchanged      Progression   Progression  Continue to progress workloads to maintain intensity without signs/symptoms of physical distress.      Resistance Training   Training Prescription  Yes    Weight  orange bands    Reps  10-15    Time  10 Minutes      Bike   Level  4    Minutes  15      NuStep   Level  4    SPM  80    Minutes  15    METs  3.4       Social History   Tobacco Use  Smoking Status Former Smoker  . Packs/day: 1.00  . Years: 20.00  . Pack years: 20.00  . Types: Cigarettes  . Quit date: 04/02/2013  . Years since quitting: 6.1  Smokeless Tobacco Never Used    Goals Met:  Exercise tolerated well  Goals Unmet:  Not Applicable  Comments: Service time is from 1330 to 1430    Dr. Rush Farmer is Medical Director for Pulmonary Rehab at [facility].

## 2019-06-10 ENCOUNTER — Other Ambulatory Visit: Payer: Self-pay

## 2019-06-10 MED ORDER — ANORO ELLIPTA 62.5-25 MCG/INH IN AEPB: 1.0000 | INHALATION_SPRAY | Freq: Every day | RESPIRATORY_TRACT | 3 refills | Status: DC

## 2019-06-11 ENCOUNTER — Other Ambulatory Visit: Payer: Self-pay

## 2019-06-11 ENCOUNTER — Ambulatory Visit (HOSPITAL_COMMUNITY): Payer: 59

## 2019-06-11 ENCOUNTER — Encounter (HOSPITAL_COMMUNITY)
Admission: RE | Admit: 2019-06-11 | Discharge: 2019-06-11 | Disposition: A | Discharge status: Home / Self Care | Payer: 59 | Source: Ambulatory Visit | Attending: Pulmonary Disease | Admitting: Pulmonary Disease

## 2019-06-11 DIAGNOSIS — J439 Emphysema, unspecified: Secondary | ICD-10-CM | POA: Diagnosis not present

## 2019-06-11 NOTE — Progress Notes (Signed)
Daily Session Note  Patient Details  Name: Emily Ward MRN: [id number] Date of Birth: July 14, 1956 Referring Provider:     Pulmonary Rehab Walk Test from 04/20/2019 in [place]  Referring Provider  Dr. Vaughan Browner      Encounter Date: 06/11/2019  Check In: Session Check In - 06/11/19 1501      Check-In   Supervising physician immediately available to respond to emergencies  [facility] immediately available    Physician(s)  Dr. Benny Lennert    Location  [facility]    Staff Present  Rosebud Poles, RN, BSN;Carlette Wilber Oliphant, RN, Bjorn Loser, MS, Exercise Physiologist    Virtual Visit  No    Medication changes reported      No    Fall or balance concerns reported     No    Tobacco Cessation  No Change    Warm-up and Cool-down  Performed on first and last piece of equipment    Resistance Training Performed  Yes    VAD Patient?  No    PAD/SET Patient?  No      Pain Assessment   Currently in Pain?  Yes    Pain Score  1     Pain Location  Hip    Multiple Pain Sites  No       Capillary Blood Glucose: No results found for this or any previous visit (from the past 24 hour(s)).    Social History   Tobacco Use  Smoking Status Former Smoker  . Packs/day: 1.00  . Years: 20.00  . Pack years: 20.00  . Types: Cigarettes  . Quit date: 04/02/2013  . Years since quitting: 6.1  Smokeless Tobacco Never Used    Goals Met:  Exercise tolerated well  Goals Unmet:  Not Applicable  Comments: Service time is from 1330 to 1430    Dr. Rush Farmer is Medical Director for Pulmonary Rehab at [facility].

## 2019-06-16 ENCOUNTER — Ambulatory Visit (HOSPITAL_COMMUNITY): Payer: 59

## 2019-06-16 ENCOUNTER — Encounter (HOSPITAL_COMMUNITY)
Admission: RE | Admit: 2019-06-16 | Discharge: 2019-06-16 | Disposition: A | Discharge status: Home / Self Care | Payer: 59 | Source: Ambulatory Visit | Attending: Pulmonary Disease | Admitting: Pulmonary Disease

## 2019-06-16 ENCOUNTER — Other Ambulatory Visit: Payer: Self-pay

## 2019-06-16 DIAGNOSIS — J439 Emphysema, unspecified: Secondary | ICD-10-CM | POA: Diagnosis not present

## 2019-06-16 NOTE — Progress Notes (Signed)
Daily Session Note  Patient Details  Name: Cyntia Staley MRN: [id number] Date of Birth: 1956/01/25 Referring Provider:     Pulmonary Rehab Walk Test from 04/20/2019 in [place]  Referring Provider  Dr. Vaughan Browner      Encounter Date: 06/16/2019  Check In: Session Check In - 06/16/19 1438      Check-In   Supervising physician immediately available to respond to emergencies  [facility] immediately available    Physician(s)  Dr. Doristine Bosworth    Location  [facility]    Staff Present  Rosebud Poles, RN, BSN;Lisa Ysidro Evert, RN;Rainna Nearhood, MS, Exercise Physiologist    Virtual Visit  No    Medication changes reported      No    Fall or balance concerns reported     No    Tobacco Cessation  No Change    Warm-up and Cool-down  Performed on first and last piece of equipment    Resistance Training Performed  No    VAD Patient?  No    PAD/SET Patient?  No      Pain Assessment   Currently in Pain?  No/denies    Multiple Pain Sites  No       Capillary Blood Glucose: No results found for this or any previous visit (from the past 24 hour(s)).    Social History   Tobacco Use  Smoking Status Former Smoker  . Packs/day: 1.00  . Years: 20.00  . Pack years: 20.00  . Types: Cigarettes  . Quit date: 04/02/2013  . Years since quitting: 6.2  Smokeless Tobacco Never Used    Goals Met:  Exercise tolerated well  Goals Unmet:  Not Applicable  Comments: Service time is from 1320 to 1425    Dr. Rush Farmer is Medical Director for Pulmonary Rehab at [facility].

## 2019-06-16 NOTE — Progress Notes (Signed)
Daily Session Note  Patient Details  Name: Emily Ward MRN: [id number] Date of Birth: 05/26/1956 Referring Provider:     Pulmonary Rehab Walk Test from 04/20/2019 in [place]  Referring Provider  Dr. Vaughan Browner      Encounter Date: 06/16/2019  Check In: Session Check In - 06/16/19 1438      Check-In   Supervising physician immediately available to respond to emergencies  [facility] immediately available    Physician(s)  Dr. Doristine Bosworth    Location  [facility]    Staff Present  Rosebud Poles, RN, BSN;Lisa Ysidro Evert, RN;Lilliemae Fruge, MS, Exercise Physiologist    Virtual Visit  No    Medication changes reported      No    Fall or balance concerns reported     No    Tobacco Cessation  No Change    Warm-up and Cool-down  Performed on first and last piece of equipment    Resistance Training Performed  No    VAD Patient?  No    PAD/SET Patient?  No      Pain Assessment   Currently in Pain?  No/denies    Multiple Pain Sites  No       Capillary Blood Glucose: No results found for this or any previous visit (from the past 24 hour(s)).    Social History   Tobacco Use  Smoking Status Former Smoker  . Packs/day: 1.00  . Years: 20.00  . Pack years: 20.00  . Types: Cigarettes  . Quit date: 04/02/2013  . Years since quitting: 6.2  Smokeless Tobacco Never Used    Goals Met:  Exercise tolerated well  Goals Unmet:  Not Applicable  Comments: Service time is from 1320 to 1440    Dr. Rush Farmer is Medical Director for Pulmonary Rehab at [facility].

## 2019-06-18 ENCOUNTER — Encounter (HOSPITAL_COMMUNITY)
Admission: RE | Admit: 2019-06-18 | Discharge: 2019-06-18 | Disposition: A | Discharge status: Home / Self Care | Payer: 59 | Source: Ambulatory Visit | Attending: Pulmonary Disease | Admitting: Pulmonary Disease

## 2019-06-18 ENCOUNTER — Other Ambulatory Visit: Payer: Self-pay

## 2019-06-18 ENCOUNTER — Ambulatory Visit (HOSPITAL_COMMUNITY): Payer: 59

## 2019-06-18 DIAGNOSIS — J439 Emphysema, unspecified: Secondary | ICD-10-CM | POA: Insufficient documentation

## 2019-06-18 NOTE — Progress Notes (Signed)
Daily Session Note  Patient Details  Name: Emily Ward MRN: [id number] Date of Birth: 01/10/1956 Referring Provider:     Pulmonary Rehab Walk Test from 04/20/2019 in [name]  Referring Provider  Dr. Vaughan Browner      Encounter Date: 06/18/2019  Check In: Session Check In - 06/18/19 1502      Check-In   Supervising physician immediately available to respond to emergencies  [facility] immediately available    Physician(s)  Dr. Rolla Etienne    Location  [facility]    Staff Present  Hoy Register, MS, Exercise Physiologist;Tammie Yanda Ysidro Evert, RN;Portia Rollene Rotunda, RN, BSN    Virtual Visit  No    Medication changes reported      No    Fall or balance concerns reported     No    Tobacco Cessation  No Change    Warm-up and Cool-down  Performed on first and last piece of equipment    Resistance Training Performed  Yes    VAD Patient?  No    PAD/SET Patient?  No      Pain Assessment   Currently in Pain?  No/denies       Capillary Blood Glucose: No results found for this or any previous visit (from the past 24 hour(s)).    Social History   Tobacco Use  Smoking Status Former Smoker  . Packs/day: 1.00  . Years: 20.00  . Pack years: 20.00  . Types: Cigarettes  . Quit date: 04/02/2013  . Years since quitting: 6.2  Smokeless Tobacco Never Used    Goals Met:  Exercise tolerated well No report of cardiac concerns or symptoms Strength training completed today  Goals Unmet:  Not Applicable  Comments: Service time is from 1330 to 1441    Dr. Rush Farmer is Medical Director for Pulmonary Rehab at [facility].

## 2019-06-23 ENCOUNTER — Other Ambulatory Visit: Payer: Self-pay

## 2019-06-23 ENCOUNTER — Ambulatory Visit (HOSPITAL_COMMUNITY): Payer: 59

## 2019-06-23 ENCOUNTER — Encounter (HOSPITAL_COMMUNITY)
Admission: RE | Admit: 2019-06-23 | Discharge: 2019-06-23 | Disposition: A | Discharge status: Home / Self Care | Payer: 59 | Source: Ambulatory Visit | Attending: Pulmonary Disease | Admitting: Pulmonary Disease

## 2019-06-23 VITALS — Wt 111.1 lb

## 2019-06-23 DIAGNOSIS — J439 Emphysema, unspecified: Secondary | ICD-10-CM | POA: Diagnosis not present

## 2019-06-23 NOTE — Progress Notes (Signed)
Daily Session Note  Patient Details  Name: Hertha Gergen MRN: [id number] Date of Birth: 02/17/1956 Referring Provider:     Pulmonary Rehab Walk Test from 04/20/2019 in [place]  Referring Provider  Dr. Vaughan Browner      Encounter Date: 06/23/2019  Check In: Session Check In - 06/23/19 1407      Check-In   Supervising physician immediately available to respond to emergencies  [facility] immediately available    Physician(s)  Dr. Rolla Etienne    Location  [facility]    Staff Present  Hoy Register, MS, Exercise Physiologist;Kendrix Orman Ysidro Evert, RN;Portia Rollene Rotunda, RN, BSN    Virtual Visit  No    Medication changes reported      No    Fall or balance concerns reported     No    Tobacco Cessation  No Change    Warm-up and Cool-down  Performed on first and last piece of equipment    Resistance Training Performed  Yes    VAD Patient?  No    PAD/SET Patient?  No      Pain Assessment   Currently in Pain?  No/denies    Multiple Pain Sites  No       Capillary Blood Glucose: No results found for this or any previous visit (from the past 24 hour(s)).  Exercise Prescription Changes - 06/23/19 1500      Response to Exercise   Blood Pressure (Admit)  100/72    Blood Pressure (Exercise)  144/88    Blood Pressure (Exit)  102/68    Heart Rate (Admit)  70 bpm    Heart Rate (Exercise)  114 bpm    Heart Rate (Exit)  83 bpm    Oxygen Saturation (Admit)  100 %    Oxygen Saturation (Exercise)  92 %    Oxygen Saturation (Exit)  96 %    Rating of Perceived Exertion (Exercise)  11    Perceived Dyspnea (Exercise)  2    Duration  Continue with 30 min of aerobic exercise without signs/symptoms of physical distress.    Intensity  THRR unchanged      Progression   Progression  Continue to progress workloads to maintain intensity without signs/symptoms of physical distress.      Resistance Training   Training Prescription  Yes    Weight   orange bands    Reps  10-15    Time  10 Minutes      Bike   Level  4    Minutes  15      NuStep   Level  4    SPM  80    Minutes  15    METs  3.8       Social History   Tobacco Use  Smoking Status Former Smoker  . Packs/day: 1.00  . Years: 20.00  . Pack years: 20.00  . Types: Cigarettes  . Quit date: 04/02/2013  . Years since quitting: 6.2  Smokeless Tobacco Never Used    Goals Met:  Exercise tolerated well No report of cardiac concerns or symptoms Strength training completed today  Goals Unmet:  Not Applicable  Comments: Service time is from 1324 to 1424    Dr. Rush Farmer is Medical Director for Pulmonary Rehab at [facility].

## 2019-06-24 NOTE — Progress Notes (Signed)
Pulmonary Individual Treatment Plan  Patient Details  Name: Emily Ward MRN: [id number] Date of Birth: 1956-05-31 Referring Provider:     Pulmonary Rehab Walk Test from 04/20/2019 in [facility]  Referring Provider  Dr. Vaughan Browner      Initial Encounter Date:    Pulmonary Rehab Walk Test from 04/20/2019 in [facility]  Date  04/20/19      Visit Diagnosis: Pulmonary emphysema, unspecified emphysema type ([place])  Patient's Home Medications on Admission:   Current Outpatient Medications:  .  Albuterol Sulfate (PROAIR RESPICLICK) [id number] (90 Base) MCG/ACT AEPB, Inhale 2 puffs into the lungs every 6 (six) hours as needed., Disp: 1 each, Rfl: 5 .  butalbital-acetaminophen-caffeine (FIORICET) 50-325-40 MG tablet, TAKE 1 TABLET BY MOUTH EVERY 6 HOURS AS NEEDED, Disp: 120 tablet, Rfl: 5 .  CALCIUM PO, Take 1 tablet by mouth daily., Disp: , Rfl:  .  cetirizine (ZYRTEC) 10 MG tablet, Take 10 mg by mouth daily as needed for allergies (spring time). , Disp: , Rfl:  .  chlorzoxazone (PARAFON) 500 MG tablet, TAKE 1 TABLET BY MOUTH FOUR TIMES DAILY AS NEEDED FOR MUSCLE SPASMS (Patient taking differently: Take 500 mg by mouth 4 (four) times daily as needed for muscle spasms. ), Disp: 120 tablet, Rfl: 3 .  cholecalciferol (VITAMIN D) 1000 units tablet, Take 2,000 Units by mouth daily. , Disp: , Rfl:  .  co-enzyme Q-10 30 MG capsule, Take 30 mg by mouth daily., Disp: , Rfl:  .  Cyanocobalamin (VITAMIN B-12 PO), Take 1 tablet by mouth daily. , Disp: , Rfl:  .  cyclobenzaprine (FLEXERIL) 10 MG tablet, TAKE 1 TABLET BY MOUTH THREE TIMES DAILY AS NEEDED FOR MUSCLE SPASMS, Disp: 270 tablet, Rfl: 1 .  denosumab (PROLIA) 60 MG/ML SOSY injection, Inject 60 mg into the skin every 6 (six) months., Disp: , Rfl:  .  DULoxetine (CYMBALTA) 30 MG capsule, TAKE 2 CAPSULES BY MOUTH EVERY MORNING, Disp: 180 capsule, Rfl: 0 .  estradiol (ESTRACE) 1 MG tablet,  TAKE 1 TABLET(1 MG) BY MOUTH DAILY, Disp: 90 tablet, Rfl: 2 .  meloxicam (MOBIC) 7.5 MG tablet, TAKE 1 TABLET(7.5 MG) BY MOUTH DAILY (Patient taking differently: Take 7.5 mg by mouth daily as needed for pain. ), Disp: 90 tablet, Rfl: 3 .  terconazole (TERAZOL 7) 0.4 % vaginal cream, Place 1 applicator vaginally at bedtime., Disp: 45 g, Rfl: 0 .  umeclidinium-vilanterol (ANORO ELLIPTA) 62.5-25 MCG/INH AEPB, Inhale 1 puff into the lungs daily., Disp: 60 each, Rfl: 3 .  zolmitriptan (ZOMIG) 5 MG tablet, TAKE 1 TABLET BY MOUTH AS NEEDED, Disp: 30 tablet, Rfl: 5 .  zolpidem (AMBIEN) 10 MG tablet, TAKE 1 TABLET BY MOUTH EVERY NIGHT AT BEDTIME, Disp: 90 tablet, Rfl: 1  Past Medical History: Past Medical History:  Diagnosis Date  . Cervical disc disease 2006  . COPD (chronic obstructive pulmonary disease) ([place])   . Emphysema lung ([place])   . 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    . Fibromyalgia    sees Dr. Estanislado Pandy   . Heart murmur   . 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 Hyperlipidemia   . Migraines   . MVP (mitral valve prolapse)    no daily medication  . Peptic ulcer    no problems    Tobacco Use: Social History   Tobacco Use  Smoking Status Former Smoker  .  Packs/day: 1.00  . Years: 20.00  . Pack years: 20.00  . Types: Cigarettes  . Quit date: 04/02/2013  . Years since quitting: 6.2  Smokeless Tobacco Never Used    Labs: Recent Review Flowsheet Data    Labs for ITP Cardiac and Pulmonary Rehab Latest Ref Rng & Units 04/09/2014 04/21/2015 05/11/2016 07/12/2017 07/18/2018   Cholestrol 0 - 200 mg/dL 182 201(H) 218(H) 192 197   LDLCALC 0 - 99 mg/dL 104(H) 113(H) 130(H) 112(H) 114(H)   LDLDIRECT mg/dL - - - - -   HDL >39.00 mg/dL 51.60 66.20 74.40 65.70 72.10   Trlycerides 0.0 - 149.0 mg/dL 130.0 107.0 70.0 76.0 53.0      Capillary Blood Glucose: Lab Results  Component Value Date   GLUCAP 85 10/22/2018   GLUCAP 165 (H)  09/01/2018     Pulmonary Assessment Scores: Pulmonary Assessment Scores    Row Name 04/20/19 1132 04/20/19 1212       ADL UCSD   ADL Phase  Entry  Entry    SOB Score total  32  -      CAT Score   CAT Score  pre 14  -      mMRC Score   mMRC Score  -  2      UCSD: Self-administered rating of dyspnea associated with activities of daily living (ADLs) 6-point scale (0 = "not at all" to 5 = "maximal or unable to do because of breathlessness")  Scoring Scores range from 0 to 120.  Minimally important difference is 5 units  CAT: CAT can identify the health impairment of COPD patients and is better correlated with disease progression.  CAT has a scoring range of zero to 40. The CAT score is classified into four groups of low (less than 10), medium (10 - 20), high (21-30) and very high (31-40) based on the impact level of disease on health status. A CAT score over 10 suggests significant symptoms.  A worsening CAT score could be explained by an exacerbation, poor medication adherence, poor inhaler technique, or progression of COPD or comorbid conditions.  CAT MCID is 2 points  mMRC: mMRC (Modified Medical Research Council) Dyspnea Scale is used to assess the degree of baseline functional disability in patients of respiratory disease due to dyspnea. No minimal important difference is established. A decrease in score of 1 point or greater is considered a positive change.   Pulmonary Function Assessment: Pulmonary Function Assessment - 04/20/19 1132      Breath   Bilateral Breath Sounds  Clear    Shortness of Breath  Limiting activity;Fear of Shortness of Breath       Exercise Target Goals: Exercise Program Goal: Individual exercise prescription set using results from initial 6 min walk test and THRR while considering  patient's activity barriers and safety.   Exercise Prescription Goal: Initial exercise prescription builds to 30-45 minutes a day of aerobic activity, 2-3 days per  week.  Home exercise guidelines will be given to patient during program as part of exercise prescription that the participant will acknowledge.  Activity Barriers & Risk Stratification: Activity Barriers & Cardiac Risk Stratification - 04/20/19 1117      Activity Barriers & Cardiac Risk Stratification   Activity Barriers  Fibromyalgia       6 Minute Walk: 6 Minute Walk    Row Name 04/20/19 1154         6 Minute Walk   Phase  Initial     Distance  [id number]  feet     Walk Time  6 minutes     # of Rest Breaks  0     MPH  3.17     METS  3.45     RPE  12     Perceived Dyspnea   2     VO2 Peak  15.33     Symptoms  No     Resting HR  81 bpm     Resting BP  118/64     Resting Oxygen Saturation   99 %     Exercise Oxygen Saturation  during 6 min walk  89 %     Max Ex. HR  105 bpm     Max Ex. BP  124/62     2 Minute Post BP  112/68       Interval HR   1 Minute HR  87     2 Minute HR  94     3 Minute HR  98     4 Minute HR  99     5 Minute HR  106     6 Minute HR  105     2 Minute Post HR  76     Interval Heart Rate?  Yes       Interval Oxygen   Interval Oxygen?  Yes     Baseline Oxygen Saturation %  99 %     1 Minute Oxygen Saturation %  94 %     1 Minute Liters of Oxygen  0 L     2 Minute Oxygen Saturation %  92 %     2 Minute Liters of Oxygen  0 L     3 Minute Oxygen Saturation %  91 %     3 Minute Liters of Oxygen  0 L     4 Minute Oxygen Saturation %  92 %     4 Minute Liters of Oxygen  0 L     5 Minute Oxygen Saturation %  89 %     5 Minute Liters of Oxygen  0 L     6 Minute Oxygen Saturation %  89 %     6 Minute Liters of Oxygen  0 L     2 Minute Post Oxygen Saturation %  97 %     2 Minute Post Liters of Oxygen  0 L        Oxygen Initial Assessment: Oxygen Initial Assessment - 04/20/19 1154      Home Oxygen   Home Oxygen Device  None    Sleep Oxygen Prescription  None    Home Exercise Oxygen Prescription  None    Home at Rest Exercise Oxygen Prescription   None      Initial 6 min Walk   Oxygen Used  None      Program Oxygen Prescription   Program Oxygen Prescription  None      Intervention   Short Term Goals  To learn and exhibit compliance with exercise, home and travel O2 prescription;To learn and understand importance of monitoring SPO2 with pulse oximeter and demonstrate accurate use of the pulse oximeter.;To learn and understand importance of maintaining oxygen saturations>88%;To learn and demonstrate proper pursed lip breathing techniques or other breathing techniques.;To learn and demonstrate proper use of respiratory medications    Long  Term Goals  Compliance with respiratory medication;Exhibits proper breathing techniques, such as pursed lip breathing or other method taught during program session;Maintenance of O2 saturations>88%;Verbalizes  importance of monitoring SPO2 with pulse oximeter and return demonstration;Exhibits compliance with exercise, home and travel O2 prescription       Oxygen Re-Evaluation: Oxygen Re-Evaluation    Row Name 04/28/19 1500 05/25/19 0852 06/23/19 1521         Program Oxygen Prescription   Program Oxygen Prescription  None  None  None       Home Oxygen   Home Oxygen Device  None  None  None     Sleep Oxygen Prescription  None  None  None     Home Exercise Oxygen Prescription  None  None  None     Home at Rest Exercise Oxygen Prescription  None  None  None     Compliance with Home Oxygen Use  -  Yes  Yes       Goals/Expected Outcomes   Short Term Goals  To learn and exhibit compliance with exercise, home and travel O2 prescription;To learn and understand importance of monitoring SPO2 with pulse oximeter and demonstrate accurate use of the pulse oximeter.;To learn and understand importance of maintaining oxygen saturations>88%;To learn and demonstrate proper pursed lip breathing techniques or other breathing techniques.;To learn and demonstrate proper use of respiratory medications  To learn and  exhibit compliance with exercise, home and travel O2 prescription;To learn and understand importance of monitoring SPO2 with pulse oximeter and demonstrate accurate use of the pulse oximeter.;To learn and understand importance of maintaining oxygen saturations>88%;To learn and demonstrate proper pursed lip breathing techniques or other breathing techniques.;To learn and demonstrate proper use of respiratory medications  To learn and exhibit compliance with exercise, home and travel O2 prescription;To learn and understand importance of monitoring SPO2 with pulse oximeter and demonstrate accurate use of the pulse oximeter.;To learn and understand importance of maintaining oxygen saturations>88%;To learn and demonstrate proper pursed lip breathing techniques or other breathing techniques.;To learn and demonstrate proper use of respiratory medications     Long  Term Goals  Exhibits compliance with exercise, home and travel O2 prescription;Verbalizes importance of monitoring SPO2 with pulse oximeter and return demonstration;Maintenance of O2 saturations>88%;Exhibits proper breathing techniques, such as pursed lip breathing or other method taught during program session;Compliance with respiratory medication  Exhibits compliance with exercise, home and travel O2 prescription;Verbalizes importance of monitoring SPO2 with pulse oximeter and return demonstration;Maintenance of O2 saturations>88%;Exhibits proper breathing techniques, such as pursed lip breathing or other method taught during program session;Compliance with respiratory medication  Exhibits compliance with exercise, home and travel O2 prescription;Verbalizes importance of monitoring SPO2 with pulse oximeter and return demonstration;Maintenance of O2 saturations>88%;Exhibits proper breathing techniques, such as pursed lip breathing or other method taught during program session;Compliance with respiratory medication     Goals/Expected Outcomes  compliance   compliance  compliance        Oxygen Discharge (Final Oxygen Re-Evaluation): Oxygen Re-Evaluation - 06/23/19 1521      Program Oxygen Prescription   Program Oxygen Prescription  None      Home Oxygen   Home Oxygen Device  None    Sleep Oxygen Prescription  None    Home Exercise Oxygen Prescription  None    Home at Rest Exercise Oxygen Prescription  None    Compliance with Home Oxygen Use  Yes      Goals/Expected Outcomes   Short Term Goals  To learn and exhibit compliance with exercise, home and travel O2 prescription;To learn and understand importance of monitoring SPO2 with pulse oximeter and demonstrate accurate use of the pulse oximeter.;To  learn and understand importance of maintaining oxygen saturations>88%;To learn and demonstrate proper pursed lip breathing techniques or other breathing techniques.;To learn and demonstrate proper use of respiratory medications    Long  Term Goals  Exhibits compliance with exercise, home and travel O2 prescription;Verbalizes importance of monitoring SPO2 with pulse oximeter and return demonstration;Maintenance of O2 saturations>88%;Exhibits proper breathing techniques, such as pursed lip breathing or other method taught during program session;Compliance with respiratory medication    Goals/Expected Outcomes  compliance       Initial Exercise Prescription: Initial Exercise Prescription - 04/20/19 1200      Date of Initial Exercise RX and Referring Provider   Date  04/20/19    Referring Provider  Dr. Vaughan Browner      Bike   Level  2    Minutes  15      NuStep   Level  3    SPM  80    METs  15      Prescription Details   Frequency (times per week)  2    Duration  Progress to 30 minutes of continuous aerobic without signs/symptoms of physical distress      Intensity   THRR 40-80% of Max Heartrate  64-128    Ratings of Perceived Exertion  11-13    Perceived Dyspnea  0-4      Progression   Progression  Continue to progress workloads to  maintain intensity without signs/symptoms of physical distress.      Resistance Training   Training Prescription  Yes    Weight  orange bands    Reps  10-15       Perform Capillary Blood Glucose checks as needed.  Exercise Prescription Changes: Exercise Prescription Changes    Row Name 04/28/19 1500 05/07/19 1500 05/12/19 1600 05/26/19 1400 06/09/19 1600     Response to Exercise   Blood Pressure (Admit)  110/70  -  110/66  108/70  104/56   Blood Pressure (Exercise)  130/80  -  128/78  140/74  142/78   Blood Pressure (Exit)  112/64  -  100/62  104/76  110/66   Heart Rate (Admit)  89 bpm  -  82 bpm  70 bpm  76 bpm   Heart Rate (Exercise)  116 bpm  -  107 bpm  110 bpm  119 bpm   Heart Rate (Exit)  85 bpm  -  88 bpm  80 bpm  85 bpm   Oxygen Saturation (Admit)  100 %  -  95 %  98 %  96 %   Oxygen Saturation (Exercise)  95 %  -  91 %  95 %  91 %   Oxygen Saturation (Exit)  99 %  -  96 %  94 %  92 %   Rating of Perceived Exertion (Exercise)  11  -  12  13  12    Perceived Dyspnea (Exercise)  1  -  1  1  3    Duration  Continue with 30 min of aerobic exercise without signs/symptoms of physical distress.  -  Continue with 30 min of aerobic exercise without signs/symptoms of physical distress.  Continue with 30 min of aerobic exercise without signs/symptoms of physical distress.  Continue with 30 min of aerobic exercise without signs/symptoms of physical distress.   Intensity  THRR unchanged  -  THRR unchanged  THRR unchanged  THRR unchanged     Progression   Progression  Continue to progress workloads to maintain intensity without  signs/symptoms of physical distress.  -  Continue to progress workloads to maintain intensity without signs/symptoms of physical distress.  Continue to progress workloads to maintain intensity without signs/symptoms of physical distress.  Continue to progress workloads to maintain intensity without signs/symptoms of physical distress.     Resistance Training    Training Prescription  Yes  -  Yes  Yes  Yes   Weight  orange bands  -  orange bands  orange bands  orange bands   Reps  10-15  -  10-15  10-15  10-15   Time  10 Minutes  -  10 Minutes  10 Minutes  10 Minutes     Bike   Level  2  -  2  3.5  4   Minutes  15  -  15  15  15    METs  -  -  3.9  4.8  -     NuStep   Level  3  -  3  4  4    SPM  80  -  80  80  80   Minutes  -  -  15  15  15    METs  15  -  2.6  3.8  3.4     Home Exercise Plan   Plans to continue exercise at  -  Home (comment)  -  -  -   Frequency  -  Add 3 additional days to program exercise sessions.  -  -  -   Initial Home Exercises Provided  -  05/07/19  -  -  -   Row Name 06/23/19 1500             Response to Exercise   Blood Pressure (Admit)  100/72       Blood Pressure (Exercise)  144/88       Blood Pressure (Exit)  102/68       Heart Rate (Admit)  70 bpm       Heart Rate (Exercise)  114 bpm       Heart Rate (Exit)  83 bpm       Oxygen Saturation (Admit)  100 %       Oxygen Saturation (Exercise)  92 %       Oxygen Saturation (Exit)  96 %       Rating of Perceived Exertion (Exercise)  11       Perceived Dyspnea (Exercise)  2       Duration  Continue with 30 min of aerobic exercise without signs/symptoms of physical distress.       Intensity  THRR unchanged         Progression   Progression  Continue to progress workloads to maintain intensity without signs/symptoms of physical distress.         Resistance Training   Training Prescription  Yes       Weight  orange bands       Reps  10-15       Time  10 Minutes         Bike   Level  4       Minutes  15         NuStep   Level  4       SPM  80       Minutes  15       METs  3.8          Exercise Comments: Exercise Comments  [place] Name 05/07/19 1501           Exercise Comments  home exercise complete          Exercise Goals and Review: Exercise Goals    Row Name 05/25/19 [id number] 06/23/19 1522           Exercise Goals   Increase  Physical Activity  Yes  Yes      Intervention  Provide advice, education, support and counseling about physical activity/exercise needs.;Develop an individualized exercise prescription for aerobic and resistive training based on initial evaluation findings, risk stratification, comorbidities and participant's personal goals.  Provide advice, education, support and counseling about physical activity/exercise needs.;Develop an individualized exercise prescription for aerobic and resistive training based on initial evaluation findings, risk stratification, comorbidities and participant's personal goals.      Expected Outcomes  Short Term: Attend rehab on a regular basis to increase amount of physical activity.;Long Term: Add in home exercise to make exercise part of routine and to increase amount of physical activity.;Long Term: Exercising regularly at least 3-5 days a week.  Short Term: Attend rehab on a regular basis to increase amount of physical activity.;Long Term: Add in home exercise to make exercise part of routine and to increase amount of physical activity.;Long Term: Exercising regularly at least 3-5 days a week.      Increase Strength and Stamina  Yes  Yes      Intervention  Provide advice, education, support and counseling about physical activity/exercise needs.;Develop an individualized exercise prescription for aerobic and resistive training based on initial evaluation findings, risk stratification, comorbidities and participant's personal goals.  Provide advice, education, support and counseling about physical activity/exercise needs.;Develop an individualized exercise prescription for aerobic and resistive training based on initial evaluation findings, risk stratification, comorbidities and participant's personal goals.      Expected Outcomes  Short Term: Increase workloads from initial exercise prescription for resistance, speed, and METs.;Short Term: Perform resistance training exercises  routinely during rehab and add in resistance training at home;Long Term: Improve cardiorespiratory fitness, muscular endurance and strength as measured by increased METs and functional capacity (6MWT)  Short Term: Increase workloads from initial exercise prescription for resistance, speed, and METs.;Short Term: Perform resistance training exercises routinely during rehab and add in resistance training at home;Long Term: Improve cardiorespiratory fitness, muscular endurance and strength as measured by increased METs and functional capacity (6MWT)      Able to understand and use rate of perceived exertion (RPE) scale  Yes  Yes      Intervention  Provide education and explanation on how to use RPE scale  Provide education and explanation on how to use RPE scale      Expected Outcomes  Short Term: Able to use RPE daily in rehab to express subjective intensity level;Long Term:  Able to use RPE to guide intensity level when exercising independently  Short Term: Able to use RPE daily in rehab to express subjective intensity level;Long Term:  Able to use RPE to guide intensity level when exercising independently      Able to understand and use Dyspnea scale  Yes  Yes      Intervention  Provide education and explanation on how to use Dyspnea scale  Provide education and explanation on how to use Dyspnea scale      Expected Outcomes  Short Term: Able to use Dyspnea scale daily in rehab to express subjective sense of shortness of breath during exertion;Long Term: Able to use Dyspnea scale to  guide intensity level when exercising independently  Short Term: Able to use Dyspnea scale daily in rehab to express subjective sense of shortness of breath during exertion;Long Term: Able to use Dyspnea scale to guide intensity level when exercising independently      Knowledge and understanding of Target Heart Rate Range (THRR)  Yes  Yes      Expected Outcomes  Short Term: Able to state/look up THRR;Short Term: Able to use daily  as guideline for intensity in rehab;Long Term: Able to use THRR to govern intensity when exercising independently  Short Term: Able to state/look up THRR;Short Term: Able to use daily as guideline for intensity in rehab;Long Term: Able to use THRR to govern intensity when exercising independently      Understanding of Exercise Prescription  Yes  Yes      Intervention  Provide education, explanation, and written materials on patient's individual exercise prescription  Provide education, explanation, and written materials on patient's individual exercise prescription      Expected Outcomes  Short Term: Able to explain program exercise prescription;Long Term: Able to explain home exercise prescription to exercise independently  Short Term: Able to explain program exercise prescription;Long Term: Able to explain home exercise prescription to exercise independently         Exercise Goals Re-Evaluation : Exercise Goals Re-Evaluation    Row Name 04/28/19 1504 05/25/19 0857 06/23/19 1522         Exercise Goal Re-Evaluation   Exercise Goals Review  Increase Physical Activity;Increase Strength and Stamina;Able to understand and use rate of perceived exertion (RPE) scale;Able to understand and use Dyspnea scale;Knowledge and understanding of Target Heart Rate Range (THRR);Understanding of Exercise Prescription  Increase Physical Activity;Increase Strength and Stamina;Able to understand and use rate of perceived exertion (RPE) scale;Able to understand and use Dyspnea scale;Knowledge and understanding of Target Heart Rate Range (THRR);Understanding of Exercise Prescription  Increase Physical Activity;Increase Strength and Stamina;Able to understand and use rate of perceived exertion (RPE) scale;Able to understand and use Dyspnea scale;Knowledge and understanding of Target Heart Rate Range (THRR);Understanding of Exercise Prescription     Comments  Pt just completed her first exercise session. Pt exercised at 2.5  METs on the stepper. Will monitor and progress as able.  Pt has completed 8 exercise sessions. Pt is highly motivated and eager for workload increases. Pt exercises at 3.8 METs on the stepper. Pt is currently looking for an exercise bike so that she can continue her exercise at home. Will continue to monitor and progress as able.  Pt has completed 16 exercise sessions. Pt will be graduating on 10/22 and plans to continue exercising on her own. She has recently purchased a airdyne bike to exercise on. Pt exercises at 3.8 METs on the stepper. Will continue to monitor and progress as able.     Expected Outcomes  Through exercise at rehab and at home, the patient will decrease shortness of breath with daily activities and feel confident in carrying out an exercse regime at home.  Through exercise at rehab and at home, the patient will decrease shortness of breath with daily activities and feel confident in carrying out an exercse regime at home.  Through exercise at rehab and at home, the patient will decrease shortness of breath with daily activities and feel confident in carrying out an exercise regime at home.        Discharge Exercise Prescription (Final Exercise Prescription Changes): Exercise Prescription Changes - 06/23/19 1500  Response to Exercise   Blood Pressure (Admit)  100/72    Blood Pressure (Exercise)  144/88    Blood Pressure (Exit)  102/68    Heart Rate (Admit)  70 bpm    Heart Rate (Exercise)  114 bpm    Heart Rate (Exit)  83 bpm    Oxygen Saturation (Admit)  100 %    Oxygen Saturation (Exercise)  92 %    Oxygen Saturation (Exit)  96 %    Rating of Perceived Exertion (Exercise)  11    Perceived Dyspnea (Exercise)  2    Duration  Continue with 30 min of aerobic exercise without signs/symptoms of physical distress.    Intensity  THRR unchanged      Progression   Progression  Continue to progress workloads to maintain intensity without signs/symptoms of physical distress.       Resistance Training   Training Prescription  Yes    Weight  orange bands    Reps  10-15    Time  10 Minutes      Bike   Level  4    Minutes  15      NuStep   Level  4    SPM  80    Minutes  15    METs  3.8       Nutrition:  Target Goals: Understanding of nutrition guidelines, daily intake of sodium 1500mg , cholesterol 200mg , calories 30% from fat and 7% or less from saturated fats, daily to have 5 or more servings of fruits and vegetables.  Biometrics: Pre Biometrics - 04/20/19 1118      Pre Biometrics   Height  5\' 5"  (1.651 m)    Weight  110 lb 7.2 oz (50.1 kg)    BMI (Calculated)  18.38    Grip Strength  15 kg        Nutrition Therapy Plan and Nutrition Goals:   Nutrition Assessments:   Nutrition Goals Re-Evaluation:   Nutrition Goals Discharge (Final Nutrition Goals Re-Evaluation):   Psychosocial: Target Goals: Acknowledge presence or absence of significant depression and/or stress, maximize coping skills, provide positive support system. Participant is able to verbalize types and ability to use techniques and skills needed for reducing stress and depression.  Initial Review & Psychosocial Screening: Initial Psych Review & Screening - 04/20/19 1134      Initial Review   Current issues with  Current Sleep Concerns      Family Dynamics   Good Support System?  Yes      Barriers   Psychosocial barriers to participate in program  There are no identifiable barriers or psychosocial needs.      Screening Interventions   Interventions  Encouraged to exercise    Expected Outcomes  Long Term Goal: Stressors or current issues are controlled or eliminated.;Long Term goal: The participant improves quality of Life and PHQ9 Scores as seen by post scores and/or verbalization of changes       Quality of Life Scores:  Scores of 19 and below usually indicate a poorer quality of life in these areas.  A difference of  2-3 points is a clinically meaningful  difference.  A difference of 2-3 points in the total score of the Quality of Life Index has been associated with significant improvement in overall quality of life, self-image, physical symptoms, and general health in studies assessing change in quality of life.  PHQ-9: Recent Review Flowsheet Data    Depression screen [facility] 2/9 04/20/2019 07/18/2018  Decreased Interest 0 0   Down, Depressed, Hopeless 0 -   PHQ - 2 Score 0 0   Altered sleeping 3 -   Tired, decreased energy 1 -   Change in appetite 0 -   Feeling bad or failure about yourself  0 -   Trouble concentrating 3 -   Moving slowly or fidgety/restless 0 -   Suicidal thoughts 0 -   PHQ-9 Score 7 -   Difficult doing work/chores Not difficult at all -     Interpretation of Total Score  Total Score Depression Severity:  1-4 = Minimal depression, 5-9 = Mild depression, 10-14 = Moderate depression, 15-19 = Moderately severe depression, 20-27 = Severe depression   Psychosocial Evaluation and Intervention: Psychosocial Evaluation - 04/20/19 1135      Psychosocial Evaluation & Interventions   Interventions  Relaxation education;Encouraged to exercise with the program and follow exercise prescription    Comments  in hopes exercise will improve sleeping habits and improve quality of life.    Expected Outcomes  No barriers to participation in pulmonary rehab    Continue Psychosocial Services   Follow up required by staff       Psychosocial Re-Evaluation: Psychosocial Re-Evaluation    [place] Name 04/20/19 1137 04/28/19 1550 05/25/19 1413 06/24/19 1303       Psychosocial Re-Evaluation   Current issues with  Current Sleep Concerns  Current Sleep Concerns  None Identified  None Identified    Comments  -  Just started program today, too early to see if exercise improves sleep disturbances  No barriers or psychosocial concerns identified.  -    Expected Outcomes  -  No barriers to participation in pulmonary rehab  Will continue to have no  barriers or psychosocial concerns while participating in pulmonary rehab.  will continue to have no barriers or psychosocial concerns while in pulmonary rehab.    Interventions  -  Encouraged to attend Pulmonary Rehabilitation for the exercise  Encouraged to attend Pulmonary Rehabilitation for the exercise  Encouraged to attend Pulmonary Rehabilitation for the exercise    Continue Psychosocial Services   -  Follow up required by staff  No Follow up required  No Follow up required       Psychosocial Discharge (Final Psychosocial Re-Evaluation): Psychosocial Re-Evaluation - 06/24/19 1303      Psychosocial Re-Evaluation   Current issues with  None Identified    Expected Outcomes  will continue to have no barriers or psychosocial concerns while in pulmonary rehab.    Interventions  Encouraged to attend Pulmonary Rehabilitation for the exercise    Continue Psychosocial Services   No Follow up required       Education: Education Goals: Education classes will be provided on a weekly basis, covering required topics. Participant will state understanding/return demonstration of topics presented.  Learning Barriers/Preferences: Learning Barriers/Preferences - 04/20/19 1138      Learning Barriers/Preferences   Learning Barriers  None    Learning Preferences  Audio;Computer/Internet;Group Instruction;Individual Instruction;Pictoral;Skilled Demonstration;Verbal Instruction;Video;Written Material       Education Topics: Risk Factor Reduction:  -Group instruction that is supported by a PowerPoint presentation. Instructor discusses the definition of a risk factor, different risk factors for pulmonary disease, and how the heart and lungs work together.     Nutrition for Pulmonary Patient:  -Group instruction provided by PowerPoint slides, verbal discussion, and written materials to support subject matter. The instructor gives an explanation and review of healthy diet recommendations, which includes  a  discussion on weight management, recommendations for fruit and vegetable consumption, as well as protein, fluid, caffeine, fiber, sodium, sugar, and alcohol. Tips for eating when patients are short of breath are discussed.   Pursed Lip Breathing:  -Group instruction that is supported by demonstration and informational handouts. Instructor discusses the benefits of pursed lip and diaphragmatic breathing and detailed demonstration on how to preform both.     Oxygen Safety:  -Group instruction provided by PowerPoint, verbal discussion, and written material to support subject matter. There is an overview of "What is Oxygen" and "Why do we need it".  Instructor also reviews how to create a safe environment for oxygen use, the importance of using oxygen as prescribed, and the risks of noncompliance. There is a brief discussion on traveling with oxygen and resources the patient may utilize.   Oxygen Equipment:  -Group instruction provided by [facility] Staff utilizing handouts, written materials, and equipment demonstrations.   Signs and Symptoms:  -Group instruction provided by written material and verbal discussion to support subject matter. Warning signs and symptoms of infection, stroke, and heart attack are reviewed and when to call the physician/911 reinforced. Tips for preventing the spread of infection discussed.   Advanced Directives:  -Group instruction provided by verbal instruction and written material to support subject matter. Instructor reviews Advanced Directive laws and proper instruction for filling out document.   Pulmonary Video:  -Group video education that reviews the importance of medication and oxygen compliance, exercise, good nutrition, pulmonary hygiene, and pursed lip and diaphragmatic breathing for the pulmonary patient.   Exercise for the Pulmonary Patient:  -Group instruction that is supported by a PowerPoint presentation. Instructor discusses benefits of  exercise, core components of exercise, frequency, duration, and intensity of an exercise routine, importance of utilizing pulse oximetry during exercise, safety while exercising, and options of places to exercise outside of rehab.     Pulmonary Medications:  -Verbally interactive group education provided by instructor with focus on inhaled medications and proper administration.   Anatomy and Physiology of the Respiratory System and Intimacy:  -Group instruction provided by PowerPoint, verbal discussion, and written material to support subject matter. Instructor reviews respiratory cycle and anatomical components of the respiratory system and their functions. Instructor also reviews differences in obstructive and restrictive respiratory diseases with examples of each. Intimacy, Sex, and Sexuality differences are reviewed with a discussion on how relationships can change when diagnosed with pulmonary disease. Common sexual concerns are reviewed.   MD DAY -A group question and answer session with a medical doctor that allows participants to ask questions that relate to their pulmonary disease state.   OTHER EDUCATION -Group or individual verbal, written, or video instructions that support the educational goals of the pulmonary rehab program.   Holiday Eating Survival Tips:  -Group instruction provided by PowerPoint slides, verbal discussion, and written materials to support subject matter. The instructor gives patients tips, tricks, and techniques to help them not only survive but enjoy the holidays despite the onslaught of food that accompanies the holidays.   Knowledge Questionnaire Score: Knowledge Questionnaire Score - 04/20/19 1134      Knowledge Questionnaire Score   Pre Score  16/18  pre       Core Components/Risk Factors/Patient Goals at Admission: Personal Goals and Risk Factors at Admission - 04/20/19 1138      Core Components/Risk Factors/Patient Goals on Admission    Improve shortness of breath with ADL's  Yes    Intervention  Provide education, individualized  exercise plan and daily activity instruction to help decrease symptoms of SOB with activities of daily living.    Expected Outcomes  Short Term: Improve cardiorespiratory fitness to achieve a reduction of symptoms when performing ADLs;Long Term: Be able to perform more ADLs without symptoms or delay the onset of symptoms       Core Components/Risk Factors/Patient Goals Review:  Goals and Risk Factor Review    Row Name 04/20/19 1138 04/28/19 1552 05/25/19 1414 06/24/19 1304       Core Components/Risk Factors/Patient Goals Review   Personal Goals Review  Increase knowledge of respiratory medications and ability to use respiratory devices properly.;Improve shortness of breath with ADL's;Develop more efficient breathing techniques such as purse lipped breathing and diaphragmatic breathing and practicing self-pacing with activity.  Increase knowledge of respiratory medications and ability to use respiratory devices properly.;Improve shortness of breath with ADL's;Develop more efficient breathing techniques such as purse lipped breathing and diaphragmatic breathing and practicing self-pacing with activity.  Increase knowledge of respiratory medications and ability to use respiratory devices properly.;Improve shortness of breath with ADL's;Develop more efficient breathing techniques such as purse lipped breathing and diaphragmatic breathing and practicing self-pacing with activity.  Increase knowledge of respiratory medications and ability to use respiratory devices properly.;Improve shortness of breath with ADL's;Develop more efficient breathing techniques such as purse lipped breathing and diaphragmatic breathing and practicing self-pacing with activity.    Review  -  Just started program today, too early to see progress towards program goals  Has attended 8 exercise sessions, she is tolerating workload progression  well, very positive, seems to enjoy the program.  Able to demonstrate purse lip breathing and is self sufficient on exercise equipment.  Patient has done well in program, graduates 06/25/2019.  Has increased workloads on equipment to level 4 on [employer] bike and level 4 on nusteo despite her fibromyalgia.  She is capable of exercising independently and hopefully will continue after discharge.    Expected Outcomes  -  see admission goals  See admission goals.  See admission goals.       Core Components/Risk Factors/Patient Goals at Discharge (Final Review):  Goals and Risk Factor Review - 06/24/19 1304      Core Components/Risk Factors/Patient Goals Review   Personal Goals Review  Increase knowledge of respiratory medications and ability to use respiratory devices properly.;Improve shortness of breath with ADL's;Develop more efficient breathing techniques such as purse lipped breathing and diaphragmatic breathing and practicing self-pacing with activity.    Review  Patient has done well in program, graduates 06/25/2019.  Has increased workloads on equipment to level 4 on [employer] bike and level 4 on nusteo despite her fibromyalgia.  She is capable of exercising independently and hopefully will continue after discharge.    Expected Outcomes  See admission goals.       ITP Comments:   Comments: ITP REVIEW Pt is making expected progress toward pulmonary rehab goals after completing 16 sessions. Recommend continued exercise, life style modification, education, and utilization of breathing techniques to increase stamina and strength and decrease shortness of breath with exertion.

## 2019-06-25 ENCOUNTER — Ambulatory Visit (HOSPITAL_COMMUNITY): Payer: 59

## 2019-06-25 ENCOUNTER — Encounter (HOSPITAL_COMMUNITY)
Admission: RE | Admit: 2019-06-25 | Discharge: 2019-06-25 | Disposition: A | Discharge status: Home / Self Care | Payer: 59 | Source: Ambulatory Visit | Attending: Pulmonary Disease | Admitting: Pulmonary Disease

## 2019-06-25 ENCOUNTER — Other Ambulatory Visit: Payer: Self-pay

## 2019-06-25 DIAGNOSIS — J439 Emphysema, unspecified: Secondary | ICD-10-CM | POA: Diagnosis not present

## 2019-06-29 NOTE — Progress Notes (Signed)
Discharge Progress Report  Patient Details  Name: Emily Ward MRN: [id number] Date of Birth: October 27, 1955 Referring Provider:     Pulmonary Rehab Walk Test from 04/20/2019 in [place]  Referring Provider  Dr. Vaughan Browner       Number of Visits: 17  Reason for Discharge:  Patient reached a stable level of exercise. Patient independent in their exercise. Patient has met program and personal goals.  Smoking History:  Social History   Tobacco Use  Smoking Status Former Smoker  . Packs/day: 1.00  . Years: 20.00  . Pack years: 20.00  . Types: Cigarettes  . Quit date: 04/02/2013  . Years since quitting: 6.2  Smokeless Tobacco Never Used    Diagnosis:  Pulmonary emphysema, unspecified emphysema type ([place])  ADL [facility]: Pulmonary Assessment Scores    Row Name 04/20/19 1132 04/20/19 1212 06/25/19 1444     ADL [facility]   ADL Phase  Entry  Entry  Exit   SOB Score total  32  -  34     CAT Score   CAT Score  pre 14  -  14     mMRC Score   mMRC Score  -  2  1      Initial Exercise Prescription: Initial Exercise Prescription - 04/20/19 1200      Date of Initial Exercise RX and Referring Provider   Date  04/20/19    Referring Provider  Dr. Vaughan Browner      Bike   Level  2    Minutes  15      NuStep   Level  3    SPM  80    METs  15      Prescription Details   Frequency (times per week)  2    Duration  Progress to 30 minutes of continuous aerobic without signs/symptoms of physical distress      Intensity   THRR 40-80% of Max Heartrate  64-128    Ratings of Perceived Exertion  11-13    Perceived Dyspnea  0-4      Progression   Progression  Continue to progress workloads to maintain intensity without signs/symptoms of physical distress.      Resistance Training   Training Prescription  Yes    Weight  orange bands    Reps  10-15       Discharge Exercise Prescription (Final Exercise Prescription Changes): Exercise Prescription  Changes - 06/23/19 1500      Response to Exercise   Blood Pressure (Admit)  100/72    Blood Pressure (Exercise)  144/88    Blood Pressure (Exit)  102/68    Heart Rate (Admit)  70 bpm    Heart Rate (Exercise)  114 bpm    Heart Rate (Exit)  83 bpm    Oxygen Saturation (Admit)  100 %    Oxygen Saturation (Exercise)  92 %    Oxygen Saturation (Exit)  96 %    Rating of Perceived Exertion (Exercise)  11    Perceived Dyspnea (Exercise)  2    Duration  Continue with 30 min of aerobic exercise without signs/symptoms of physical distress.    Intensity  THRR unchanged      Progression   Progression  Continue to progress workloads to maintain intensity without signs/symptoms of physical distress.      Resistance Training   Training Prescription  Yes    Weight  orange bands    Reps  10-15  Time  10 Minutes      Bike   Level  4    Minutes  15      NuStep   Level  4    SPM  80    Minutes  15    METs  3.8       Functional Capacity: 6 Minute Walk    Row Name 04/20/19 1154 06/25/19 1450       6 Minute Walk   Phase  Initial  Discharge    Distance  1674 feet  1763 feet    Distance Feet Change  -  89 ft    Walk Time  6 minutes  6 minutes    # of Rest Breaks  0  0    MPH  3.17  3.34    METS  3.45  3.53    RPE  12  11    Perceived Dyspnea   2  2    VO2 Peak  15.33  17.04    Symptoms  No  No    Resting HR  81 bpm  78 bpm    Resting BP  118/64  100/70    Resting Oxygen Saturation   99 %  96 %    Exercise Oxygen Saturation  during 6 min walk  89 %  91 %    Max Ex. HR  105 bpm  115 bpm    Max Ex. BP  124/62  132/64    2 Minute Post BP  112/68  112/74      Interval HR   1 Minute HR  87  97    2 Minute HR  94  105    3 Minute HR  98  109    4 Minute HR  99  107    5 Minute HR  106  114    6 Minute HR  105  115    2 Minute Post HR  76  81    Interval Heart Rate?  Yes  Yes      Interval Oxygen   Interval Oxygen?  Yes  Yes    Baseline Oxygen Saturation %  99 %  96 %    1  Minute Oxygen Saturation %  94 %  96 %    1 Minute Liters of Oxygen  0 L  0 L    2 Minute Oxygen Saturation %  92 %  94 %    2 Minute Liters of Oxygen  0 L  0 L    3 Minute Oxygen Saturation %  91 %  92 %    3 Minute Liters of Oxygen  0 L  0 L    4 Minute Oxygen Saturation %  92 %  93 %    4 Minute Liters of Oxygen  0 L  0 L    5 Minute Oxygen Saturation %  89 %  91 %    5 Minute Liters of Oxygen  0 L  0 L    6 Minute Oxygen Saturation %  89 %  92 %    6 Minute Liters of Oxygen  0 L  0 L    2 Minute Post Oxygen Saturation %  97 %  100 %    2 Minute Post Liters of Oxygen  0 L  0 L       Psychological, QOL, Others - Outcomes: PHQ 2/9: Depression screen [facility] 2/9 06/25/2019 04/20/2019 07/18/2018  Decreased Interest  0 0 0  Down, Depressed, Hopeless 0 0 -  PHQ - 2 Score 0 0 0  Altered sleeping 0 3 -  Tired, decreased energy 0 1 -  Change in appetite 0 0 -  Feeling bad or failure about yourself  0 0 -  Trouble concentrating 0 3 -  Moving slowly or fidgety/restless 0 0 -  Suicidal thoughts 0 0 -  PHQ-9 Score 0 7 -  Difficult doing work/chores Not difficult at all Not difficult at all -  Some recent data might be hidden    Quality of Life:   Personal Goals: Goals established at orientation with interventions provided to work toward goal. Personal Goals and Risk Factors at Admission - 04/20/19 1138      Core Components/Risk Factors/Patient Goals on Admission   Improve shortness of breath with ADL's  Yes    Intervention  Provide education, individualized exercise plan and daily activity instruction to help decrease symptoms of SOB with activities of daily living.    Expected Outcomes  Short Term: Improve cardiorespiratory fitness to achieve a reduction of symptoms when performing ADLs;Long Term: Be able to perform more ADLs without symptoms or delay the onset of symptoms        Personal Goals Discharge: Goals and Risk Factor Review    Row Name 04/20/19 1138 04/28/19 1552  05/25/19 1414 06/24/19 1304       Core Components/Risk Factors/Patient Goals Review   Personal Goals Review  Increase knowledge of respiratory medications and ability to use respiratory devices properly.;Improve shortness of breath with ADL's;Develop more efficient breathing techniques such as purse lipped breathing and diaphragmatic breathing and practicing self-pacing with activity.  Increase knowledge of respiratory medications and ability to use respiratory devices properly.;Improve shortness of breath with ADL's;Develop more efficient breathing techniques such as purse lipped breathing and diaphragmatic breathing and practicing self-pacing with activity.  Increase knowledge of respiratory medications and ability to use respiratory devices properly.;Improve shortness of breath with ADL's;Develop more efficient breathing techniques such as purse lipped breathing and diaphragmatic breathing and practicing self-pacing with activity.  Increase knowledge of respiratory medications and ability to use respiratory devices properly.;Improve shortness of breath with ADL's;Develop more efficient breathing techniques such as purse lipped breathing and diaphragmatic breathing and practicing self-pacing with activity.    Review  -  Just started program today, too early to see progress towards program goals  Has attended 8 exercise sessions, she is tolerating workload progression well, very positive, seems to enjoy the program.  Able to demonstrate purse lip breathing and is self sufficient on exercise equipment.  Patient has done well in program, graduates 06/25/2019.  Has increased workloads on equipment to level 4 on Sci-[employer] bike and level 4 on nusteo despite her fibromyalgia.  She is capable of exercising independently and hopefully will continue after discharge.    Expected Outcomes  -  see admission goals  See admission goals.  See admission goals.       Exercise Goals and Review: Exercise Goals    Row Name  05/25/19 0853 06/23/19 1522           Exercise Goals   Increase Physical Activity  Yes  Yes      Intervention  Provide advice, education, support and counseling about physical activity/exercise needs.;Develop an individualized exercise prescription for aerobic and resistive training based on initial evaluation findings, risk stratification, comorbidities and participant's personal goals.  Provide advice, education, support and counseling about physical activity/exercise needs.;Develop an individualized exercise  prescription for aerobic and resistive training based on initial evaluation findings, risk stratification, comorbidities and participant's personal goals.      Expected Outcomes  Short Term: Attend rehab on a regular basis to increase amount of physical activity.;Long Term: Add in home exercise to make exercise part of routine and to increase amount of physical activity.;Long Term: Exercising regularly at least 3-5 days a week.  Short Term: Attend rehab on a regular basis to increase amount of physical activity.;Long Term: Add in home exercise to make exercise part of routine and to increase amount of physical activity.;Long Term: Exercising regularly at least 3-5 days a week.      Increase Strength and Stamina  Yes  Yes      Intervention  Provide advice, education, support and counseling about physical activity/exercise needs.;Develop an individualized exercise prescription for aerobic and resistive training based on initial evaluation findings, risk stratification, comorbidities and participant's personal goals.  Provide advice, education, support and counseling about physical activity/exercise needs.;Develop an individualized exercise prescription for aerobic and resistive training based on initial evaluation findings, risk stratification, comorbidities and participant's personal goals.      Expected Outcomes  Short Term: Increase workloads from initial exercise prescription for resistance,  speed, and METs.;Short Term: Perform resistance training exercises routinely during rehab and add in resistance training at home;Long Term: Improve cardiorespiratory fitness, muscular endurance and strength as measured by increased METs and functional capacity (6MWT)  Short Term: Increase workloads from initial exercise prescription for resistance, speed, and METs.;Short Term: Perform resistance training exercises routinely during rehab and add in resistance training at home;Long Term: Improve cardiorespiratory fitness, muscular endurance and strength as measured by increased METs and functional capacity (6MWT)      Able to understand and use rate of perceived exertion (RPE) scale  Yes  Yes      Intervention  Provide education and explanation on how to use RPE scale  Provide education and explanation on how to use RPE scale      Expected Outcomes  Short Term: Able to use RPE daily in rehab to express subjective intensity level;Long Term:  Able to use RPE to guide intensity level when exercising independently  Short Term: Able to use RPE daily in rehab to express subjective intensity level;Long Term:  Able to use RPE to guide intensity level when exercising independently      Able to understand and use Dyspnea scale  Yes  Yes      Intervention  Provide education and explanation on how to use Dyspnea scale  Provide education and explanation on how to use Dyspnea scale      Expected Outcomes  Short Term: Able to use Dyspnea scale daily in rehab to express subjective sense of shortness of breath during exertion;Long Term: Able to use Dyspnea scale to guide intensity level when exercising independently  Short Term: Able to use Dyspnea scale daily in rehab to express subjective sense of shortness of breath during exertion;Long Term: Able to use Dyspnea scale to guide intensity level when exercising independently      Knowledge and understanding of Target Heart Rate Range (THRR)  Yes  Yes      Expected Outcomes   Short Term: Able to state/look up THRR;Short Term: Able to use daily as guideline for intensity in rehab;Long Term: Able to use THRR to govern intensity when exercising independently  Short Term: Able to state/look up THRR;Short Term: Able to use daily as guideline for intensity in rehab;Long Term: Able to  use THRR to govern intensity when exercising independently      Understanding of Exercise Prescription  Yes  Yes      Intervention  Provide education, explanation, and written materials on patient's individual exercise prescription  Provide education, explanation, and written materials on patient's individual exercise prescription      Expected Outcomes  Short Term: Able to explain program exercise prescription;Long Term: Able to explain home exercise prescription to exercise independently  Short Term: Able to explain program exercise prescription;Long Term: Able to explain home exercise prescription to exercise independently         Exercise Goals Re-Evaluation: Exercise Goals Re-Evaluation    Row Name 04/28/19 1504 05/25/19 0857 06/23/19 1522         Exercise Goal Re-Evaluation   Exercise Goals Review  Increase Physical Activity;Increase Strength and Stamina;Able to understand and use rate of perceived exertion (RPE) scale;Able to understand and use Dyspnea scale;Knowledge and understanding of Target Heart Rate Range (THRR);Understanding of Exercise Prescription  Increase Physical Activity;Increase Strength and Stamina;Able to understand and use rate of perceived exertion (RPE) scale;Able to understand and use Dyspnea scale;Knowledge and understanding of Target Heart Rate Range (THRR);Understanding of Exercise Prescription  Increase Physical Activity;Increase Strength and Stamina;Able to understand and use rate of perceived exertion (RPE) scale;Able to understand and use Dyspnea scale;Knowledge and understanding of Target Heart Rate Range (THRR);Understanding of Exercise Prescription     Comments   Pt just completed her first exercise session. Pt exercised at 2.5 METs on the stepper. Will monitor and progress as able.  Pt has completed 8 exercise sessions. Pt is highly motivated and eager for workload increases. Pt exercises at 3.8 METs on the stepper. Pt is currently looking for an exercise bike so that she can continue her exercise at home. Will continue to monitor and progress as able.  Pt has completed 16 exercise sessions. Pt will be graduating on 10/22 and plans to continue exercising on her own. She has recently purchased a airdyne bike to exercise on. Pt exercises at 3.8 METs on the stepper. Will continue to monitor and progress as able.     Expected Outcomes  Through exercise at rehab and at home, the patient will decrease shortness of breath with daily activities and feel confident in carrying out an exercse regime at home.  Through exercise at rehab and at home, the patient will decrease shortness of breath with daily activities and feel confident in carrying out an exercse regime at home.  Through exercise at rehab and at home, the patient will decrease shortness of breath with daily activities and feel confident in carrying out an exercise regime at home.        Nutrition & Weight - Outcomes: Pre Biometrics - 04/20/19 1118      Pre Biometrics   Height  '5\' 5"'  (1.651 m)    Weight  50.1 kg    BMI (Calculated)  18.38    Grip Strength  15 kg        Nutrition:   Nutrition Discharge:   Education Questionnaire Score: Knowledge Questionnaire Score - 06/25/19 1445      Knowledge Questionnaire Score   Post Score  17/18       Goals reviewed with patient; copy given to patient.

## 2019-07-05 ENCOUNTER — Other Ambulatory Visit: Payer: Self-pay | Admitting: Family Medicine

## 2019-07-06 NOTE — Telephone Encounter (Signed)
Okay for refill?  

## 2019-07-20 ENCOUNTER — Telehealth: Payer: Self-pay | Admitting: *Deleted

## 2019-07-20 ENCOUNTER — Telehealth: Payer: Self-pay | Admitting: Pulmonary Disease

## 2019-07-20 LAB — NOVEL CORONAVIRUS, NAA: SARS-CoV-2, NAA: POSITIVE

## 2019-07-20 NOTE — Telephone Encounter (Signed)
Copied from [place] [phone]. Topic: General - Other >> Jul 20, 2019  3:11 PM Rutherford Nail, [place] wrote: Reason for CRM: Patient calling and states that she tested + for COVID today. States that she started having symptoms late Friday, early Saturday (07/17/2019-07/18/2019). States that she just wanted to inform Dr Sarajane Jews of her + test. CB#: [phone]

## 2019-07-20 NOTE — Telephone Encounter (Signed)
Called and spoke with pt who stated she tested positive for covid today, 11/16. Pt is wanting advice in regards of care due to the positive test. Dr. Vaughan Browner, please advise on this for pt. Thanks!

## 2019-07-21 NOTE — Telephone Encounter (Signed)
Please advise her to rest at home, stay isolated, stay well-hydrated. Continue inhalers If she has worsening symptoms of shortness of breath or low oxygen levels then go to the emergency room for evaluation.

## 2019-07-21 NOTE — Telephone Encounter (Signed)
noted 

## 2019-07-21 NOTE — Telephone Encounter (Signed)
Spoke with pt, aware of results/recs.  Nothing further needed.  

## 2019-07-24 ENCOUNTER — Encounter: Payer: 59 | Admitting: Family Medicine

## 2019-08-03 ENCOUNTER — Encounter: Payer: 59 | Admitting: Family Medicine

## 2019-08-11 ENCOUNTER — Other Ambulatory Visit: Payer: Self-pay | Admitting: Family Medicine

## 2019-08-11 ENCOUNTER — Other Ambulatory Visit: Payer: Self-pay

## 2019-08-11 DIAGNOSIS — Z1231 Encounter for screening mammogram for malignant neoplasm of breast: Secondary | ICD-10-CM

## 2019-08-12 ENCOUNTER — Encounter: Payer: Self-pay | Admitting: Family Medicine

## 2019-08-12 ENCOUNTER — Ambulatory Visit (INDEPENDENT_AMBULATORY_CARE_PROVIDER_SITE_OTHER): Payer: 59 | Admitting: Family Medicine

## 2019-08-12 ENCOUNTER — Other Ambulatory Visit: Payer: Self-pay | Admitting: Rheumatology

## 2019-08-12 VITALS — BP 100/60 | HR 52 | Temp 98.0°F | Ht 65.0 in | Wt 112.0 lb

## 2019-08-12 DIAGNOSIS — Z0000 Encounter for general adult medical examination without abnormal findings: Secondary | ICD-10-CM | POA: Diagnosis not present

## 2019-08-12 DIAGNOSIS — R0789 Other chest pain: Secondary | ICD-10-CM | POA: Diagnosis not present

## 2019-08-12 MED ORDER — MELOXICAM 7.5 MG PO TABS: ORAL_TABLET | ORAL | 3 refills | Status: DC

## 2019-08-12 MED ORDER — ZOLMITRIPTAN 5 MG PO TABS: 5.0000 mg | ORAL_TABLET | ORAL | 5 refills | Status: DC | PRN

## 2019-08-12 NOTE — Progress Notes (Signed)
   Subjective:    Patient ID: Emily Ward, female    DOB: 1956/05/12, 63 y.o.   MRN: [id number]  HPI Here for a well exam. She feels fine. She has completed a pulmonary rehab program for her emphysema, and this has been effective. She has recovered from a Covid-19 infection.    Review of Systems  Constitutional: Negative.   HENT: Negative.   Eyes: Negative.   Respiratory: Negative.   Cardiovascular: Negative.   Gastrointestinal: Negative.   Genitourinary: Negative for decreased urine volume, difficulty urinating, dyspareunia, dysuria, enuresis, flank pain, frequency, hematuria, pelvic pain and urgency.  Musculoskeletal: Negative.   Skin: Negative.   Neurological: Negative.   Psychiatric/Behavioral: Negative.        Objective:   Physical Exam Constitutional:      General: She is not in acute distress.    Appearance: She is well-developed.  HENT:     Head: Normocephalic and atraumatic.     Right Ear: External ear normal.     Left Ear: External ear normal.     Nose: Nose normal.     Mouth/Throat:     Pharynx: No oropharyngeal exudate.  Eyes:     General: No scleral icterus.    Conjunctiva/sclera: Conjunctivae normal.     Pupils: Pupils are equal, round, and reactive to light.  Neck:     Musculoskeletal: Normal range of motion and neck supple.     Thyroid: No thyromegaly.     Vascular: No JVD.  Cardiovascular:     Rate and Rhythm: Normal rate and regular rhythm.     Heart sounds: Normal heart sounds. No murmur. No friction rub. No gallop.   Pulmonary:     Effort: Pulmonary effort is normal. No respiratory distress.     Breath sounds: Normal breath sounds. No wheezing or rales.  Chest:     Chest wall: No tenderness.  Abdominal:     General: Bowel sounds are normal. There is no distension.     Palpations: Abdomen is soft. There is no mass.     Tenderness: There is no abdominal tenderness. There is no guarding or rebound.  Musculoskeletal: Normal range of  motion.        General: No tenderness.  Lymphadenopathy:     Cervical: No cervical adenopathy.  Skin:    General: Skin is warm and dry.     Findings: No erythema or rash.  Neurological:     Mental Status: She is alert and oriented to person, place, and time.     Cranial Nerves: No cranial nerve deficit.     Motor: No abnormal muscle tone.     Coordination: Coordination normal.     Deep Tendon Reflexes: Reflexes are normal and symmetric. Reflexes normal.  Psychiatric:        Behavior: Behavior normal.        Thought Content: Thought content normal.        Judgment: Judgment normal.           Assessment & Plan:  Well exam. We discussed diet and exercise. Get fasting labs.  Alysia Penna, MD

## 2019-08-12 NOTE — Telephone Encounter (Signed)
Last Visit: 05/20/2019 Next Visit: 11/16/2019  Okay to refill per Dr. Estanislado Pandy.

## 2019-08-13 LAB — CBC WITH DIFFERENTIAL/PLATELET
Basophils Absolute: 0.1 10*3/uL (ref 0.0–0.1)
Basophils Relative: 1.3 % (ref 0.0–3.0)
Eosinophils Absolute: 0 10*3/uL (ref 0.0–0.7)
Eosinophils Relative: 0.3 % (ref 0.0–5.0)
HCT: 41.8 % (ref 36.0–46.0)
Hemoglobin: 13.7 g/dL (ref 12.0–15.0)
Lymphocytes Relative: 32.9 % (ref 12.0–46.0)
Lymphs Abs: 1.5 10*3/uL (ref 0.7–4.0)
MCHC: 32.8 g/dL (ref 30.0–36.0)
MCV: 96.6 fl (ref 78.0–100.0)
Monocytes Absolute: 0.3 10*3/uL (ref 0.1–1.0)
Monocytes Relative: 7.8 % (ref 3.0–12.0)
Neutro Abs: 2.6 10*3/uL (ref 1.4–7.7)
Neutrophils Relative %: 57.7 % (ref 43.0–77.0)
Platelets: 264 10*3/uL (ref 150.0–400.0)
RBC: 4.33 Mil/uL (ref 3.87–5.11)
RDW: 13.8 % (ref 11.5–15.5)
WBC: 4.4 10*3/uL (ref 4.0–10.5)

## 2019-08-13 LAB — BASIC METABOLIC PANEL
BUN: 15 mg/dL (ref 6–23)
CO2: 26 mEq/L (ref 19–32)
Calcium: 9.2 mg/dL (ref 8.4–10.5)
Chloride: 104 mEq/L (ref 96–112)
Creatinine, Ser: 0.59 mg/dL (ref 0.40–1.20)
GFR: 103.63 mL/min (ref >60.00)
Glucose, Bld: 84 mg/dL (ref 70–99)
Potassium: 3.8 mEq/L (ref 3.5–5.1)
Sodium: 139 mEq/L (ref 135–145)

## 2019-08-13 LAB — HEPATIC FUNCTION PANEL
ALT: 11 U/L (ref 0–35)
AST: 23 U/L (ref 0–37)
Albumin: 4.3 g/dL (ref 3.5–5.2)
Alkaline Phosphatase: 88 U/L (ref 39–117)
Bilirubin, Direct: 0.1 mg/dL (ref 0.0–0.3)
Total Bilirubin: 0.5 mg/dL (ref 0.2–1.2)
Total Protein: 6.9 g/dL (ref 6.0–8.3)

## 2019-08-13 LAB — LIPID PANEL
Cholesterol: 224 mg/dL — ABNORMAL HIGH (ref 0–200)
HDL: 71.9 mg/dL (ref >39.00)
LDL Cholesterol: 139 mg/dL — ABNORMAL HIGH (ref 0–99)
NonHDL: 151.7
Total CHOL/HDL Ratio: 3
Triglycerides: 63 mg/dL (ref 0.0–149.0)
VLDL: 12.6 mg/dL (ref 0.0–40.0)

## 2019-08-13 LAB — TSH: TSH: 1.59 u[IU]/mL (ref 0.35–4.50)

## 2019-09-08 ENCOUNTER — Other Ambulatory Visit: Payer: Self-pay | Admitting: Family Medicine

## 2019-09-08 DIAGNOSIS — R0789 Other chest pain: Secondary | ICD-10-CM

## 2019-09-17 ENCOUNTER — Other Ambulatory Visit: Payer: Self-pay

## 2019-09-21 ENCOUNTER — Encounter: Payer: Self-pay | Admitting: Obstetrics & Gynecology

## 2019-09-21 ENCOUNTER — Ambulatory Visit (INDEPENDENT_AMBULATORY_CARE_PROVIDER_SITE_OTHER): Payer: 59 | Admitting: Obstetrics & Gynecology

## 2019-09-21 VITALS — BP 108/62 | HR 80 | Temp 97.3°F | Resp 10 | Ht 64.5 in | Wt 112.0 lb

## 2019-09-21 DIAGNOSIS — Z01419 Encounter for gynecological examination (general) (routine) without abnormal findings: Secondary | ICD-10-CM

## 2019-09-21 MED ORDER — ESTRADIOL 1 MG PO TABS: 1.0000 mg | ORAL_TABLET | Freq: Every day | ORAL | 4 refills | Status: DC

## 2019-09-21 NOTE — Progress Notes (Signed)
64 y.o. G2P2 Married White or Caucasian female here for annual exam.  Doing well.  Denies vaginal bleeding.    Had Covid in November.  Was really sick but didn't end up in the hospital.  Had her first vaccine 09/07/2019.  She will have her repeat vaccination in 4 weeks.  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    Patient's last menstrual period was 09/03/2000 (approximate).          Sexually active: Yes.    The current method of family planning is post menopausal status.    Exercising: Yes.    exercise bike and yoga Smoker:  no  Health Maintenance: Pap:   07/15/18 Neg:Neg HR HPV  01/18/17 Neg.              05/05/15 Neg History of abnormal Pap:  yes MMG:  06/27/18 BIRADS 1 negative/density b -- scheduled 09/30/19 Colonoscopy:  06/06/12 f/u 10 years  BMD:   12/13/2017.  On Prolia.   TDaP:  10/22/18 Pneumonia vaccine(s):  Pneumovax 03/23/19 Shingrix:   Discussed with pt today Hep C testing: 09/25/16 Neg Screening Labs: PCP   reports that she quit smoking about 6 years ago. Her smoking use included cigarettes. She has a 20.00 pack-year smoking history. She has never used smokeless tobacco. She reports current alcohol use of about 1.0 standard drinks of alcohol per week. She reports that she does not use drugs.  Past Medical History:  Diagnosis Date  . Cervical disc disease 2006  . COPD (chronic obstructive pulmonary disease) ([place])   . Emphysema lung ([place])   . 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    . Fibromyalgia    sees Dr. Estanislado Pandy   . Heart murmur   . 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 Hyperlipidemia   . Migraines   . MVP (mitral valve prolapse)    no daily medication  . Peptic ulcer    no problems    Past Surgical History:  Procedure Laterality Date  . CERVICAL LAMINECTOMY  2006  . CESAREAN SECTION  1993  . CHOLECYSTECTOMY  06/2002  . COLONOSCOPY  06/06/2012   per Dr. Olevia Perches, benign polyp, repeat in 10 yrs   .  CYSTOSCOPY N/A 09/01/2018   Procedure: CYSTOSCOPY;  Surgeon: Megan Salon, MD;  Location: [facility];  Service: Gynecology;  Laterality: N/A;  . DILATATION & CURETTAGE/HYSTEROSCOPY WITH MYOSURE N/A 04/12/2017   Procedure: DILATATION & CURETTAGE/HYSTEROSCOPY;  Surgeon: Megan Salon, MD;  Location: [facility];  Service: Gynecology;  Laterality: N/A;  . HYSTEROSCOPY     years ago with Dr Radene Knee-? 2000  . TOTAL LAPAROSCOPIC HYSTERECTOMY WITH SALPINGECTOMY Bilateral 09/01/2018   Procedure: TOTAL LAPAROSCOPIC HYSTERECTOMY WITH SALPINGECTOMY  BSO, REPAIR OF PERINEAL LACERATION;  Surgeon: Megan Salon, MD;  Location: [facility];  Service: Gynecology;  Laterality: Bilateral;  possible BSO  . URETHRAL DILATION      Current Outpatient Medications  Medication Sig Dispense Refill  . butalbital-acetaminophen-caffeine (FIORICET) 50-325-40 MG tablet TAKE 1 TABLET BY MOUTH EVERY 6 HOURS AS NEEDED 120 tablet 5  . CALCIUM PO Take 1 tablet by mouth daily.    . cetirizine (ZYRTEC) 10 MG tablet Take 10 mg by mouth daily as needed for allergies (spring time).     . chlorzoxazone (PARAFON) 500 MG tablet TAKE 1 TABLET BY MOUTH FOUR TIMES DAILY AS NEEDED FOR MUSCLE SPASMS  120 tablet 5  . cholecalciferol (VITAMIN D) 1000 units tablet Take 2,000 Units by mouth daily.     Marland Kitchen co-enzyme Q-10 30 MG capsule Take 30 mg by mouth daily.    . Cyanocobalamin (VITAMIN B-12 PO) Take 1 tablet by mouth daily.     . cyclobenzaprine (FLEXERIL) 10 MG tablet TAKE 1 TABLET BY MOUTH THREE TIMES DAILY AS NEEDED FOR MUSCLE SPASMS 270 tablet 1  . denosumab (PROLIA) 60 MG/ML SOSY injection Inject 60 mg into the skin every 6 (six) months.    . DULoxetine (CYMBALTA) 30 MG capsule TAKE 2 CAPSULES BY MOUTH EVERY MORNING 180 capsule 0  . estradiol (ESTRACE) 1 MG tablet TAKE 1 TABLET(1 MG) BY MOUTH DAILY 90 tablet 2  . meloxicam (MOBIC) 7.5 MG tablet TAKE 1 TABLET(7.5 MG) BY MOUTH DAILY 90 tablet 3   . umeclidinium-vilanterol (ANORO ELLIPTA) 62.5-25 MCG/INH AEPB Inhale 1 puff into the lungs daily. 60 each 3  . zolmitriptan (ZOMIG) 5 MG tablet Take 1 tablet (5 mg total) by mouth as needed. 30 tablet 5  . zolpidem (AMBIEN) 10 MG tablet TAKE 1 TABLET BY MOUTH EVERY NIGHT AT BEDTIME 90 tablet 1   No current facility-administered medications for this visit.    Family History  Problem Relation Age of Onset  . Breast cancer Mother        lumpectomy in late 50/60  . Heart attack Father   . Congestive Heart Failure Father   . Cancer Other        breast/fhx  . Heart disease Other        fhx  . Diabetes type II Sister        and ? brother  . Arthritis Son        psoriatic  . Colon cancer Neg Hx   . Esophageal cancer Neg Hx   . Rectal cancer Neg Hx   . Stomach cancer Neg Hx     Review of Systems  All other systems reviewed and are negative.   Exam:   BP 108/62 (BP Location: Right Arm, Patient Position: Sitting, Cuff Size: Normal)   Pulse 80   Temp (!) 97.3 F (36.3 C) (Temporal)   Resp 10   Ht 5' 4.5" (1.638 m)   Wt 112 lb (50.8 kg)   LMP 09/03/2000 (Approximate)   BMI 18.93 kg/m   Height:   Height: 5' 4.5" (163.8 cm)  Ht Readings from Last 3 Encounters:  09/21/19 5' 4.5" (1.638 m)  08/12/19 5\' 5"  (1.651 m)  05/20/19 5\' 5"  (1.651 m)    General appearance: alert, cooperative and appears stated age Head: Normocephalic, without obvious abnormality, atraumatic Neck: no adenopathy, supple, symmetrical, trachea midline and thyroid normal to inspection and palpation Lungs: clear to auscultation bilaterally Breasts: normal appearance, no masses or tenderness Heart: regular rate and rhythm Abdomen: soft, non-tender; bowel sounds normal; no masses,  no organomegaly Extremities: extremities normal, atraumatic, no cyanosis or edema Skin: Skin color, texture, turgor normal. No rashes or lesions Lymph nodes: Cervical, supraclavicular, and axillary nodes normal. No abnormal  inguinal nodes palpated Neurologic: Grossly normal   Pelvic: External genitalia:  no lesions              Urethra:  normal appearing urethra with no masses, tenderness or lesions              Bartholins and Skenes: normal                 Vagina: normal  appearing vagina with normal color and discharge, no lesions              Cervix: absent              Pap taken: No. Bimanual Exam:  Uterus:  uterus absent              Adnexa: no mass, fullness, tenderness               Rectovaginal: Confirms               Anus:  normal sphincter tone, no lesions  Chaperone, Terence Lux, CMA, was present for exam.  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  This is scheduled.   pap smear not indicated today (neg pap and neg HR HPV 2019) Colonoscopy UTD BMD due after 4th prolia injection RF for estrace 1.0mg  po q day.  #90/4RF shingrix vaccination reviewed. Return annually or prn

## 2019-09-30 ENCOUNTER — Other Ambulatory Visit: Payer: Self-pay

## 2019-09-30 ENCOUNTER — Ambulatory Visit
Admission: RE | Admit: 2019-09-30 | Discharge: 2019-09-30 | Disposition: A | Discharge status: Home / Self Care | Payer: 59 | Source: Ambulatory Visit | Attending: Family Medicine | Admitting: Family Medicine

## 2019-09-30 DIAGNOSIS — Z1231 Encounter for screening mammogram for malignant neoplasm of breast: Secondary | ICD-10-CM

## 2019-10-09 ENCOUNTER — Telehealth: Payer: Self-pay | Admitting: Family Medicine

## 2019-10-09 NOTE — Telephone Encounter (Signed)
Medication Refill: butalbital-acetaminophen Pharmacy: [facility] on [address]  Fax: [phone]

## 2019-10-09 NOTE — Telephone Encounter (Signed)
Last filled 04/08/2019 Last OV 08/12/2019  Ok to fill?

## 2019-10-12 ENCOUNTER — Other Ambulatory Visit: Payer: Self-pay

## 2019-10-12 MED ORDER — BUTALBITAL-APAP-CAFFEINE 50-325-40 MG PO TABS: 1.0000 | ORAL_TABLET | Freq: Four times a day (QID) | ORAL | 5 refills | Status: DC | PRN

## 2019-10-12 NOTE — Telephone Encounter (Signed)
This was done earlier today.

## 2019-10-12 NOTE — Telephone Encounter (Signed)
Noted. Patient is aware. 

## 2019-10-12 NOTE — Telephone Encounter (Signed)
Last filled 04/08/2019 Last OV 08/12/2019  Rx printed by mistake. Ok to fill?

## 2019-10-15 NOTE — Telephone Encounter (Signed)
Pt is having issues with the Pharmacy [facility]) on refilling her butalbital-acetaminophen.They are stated that the provider denied it. I informed the pt that he Ok to refill on 10/12/19. Pt states she will go by the pharmacy to have them check again.   She will call back if there is issues

## 2019-10-16 MED ORDER — BUTALBITAL-APAP-CAFFEINE 50-325-40 MG PO TABS: 1.0000 | ORAL_TABLET | Freq: Four times a day (QID) | ORAL | 5 refills | Status: DC | PRN

## 2019-11-04 ENCOUNTER — Other Ambulatory Visit: Payer: Self-pay | Admitting: *Deleted

## 2019-11-04 MED ORDER — DULOXETINE HCL 30 MG PO CPEP: 60.0000 mg | ORAL_CAPSULE | Freq: Every morning | ORAL | 0 refills | Status: DC

## 2019-11-04 NOTE — Telephone Encounter (Signed)
Refill request received via fax  Last Visit: 05/20/2019 Next Visit: 11/16/2019  Okay to refill per Dr. Estanislado Pandy

## 2019-11-05 ENCOUNTER — Other Ambulatory Visit: Payer: Self-pay | Admitting: Family Medicine

## 2019-11-05 ENCOUNTER — Other Ambulatory Visit: Payer: Self-pay

## 2019-11-05 NOTE — Telephone Encounter (Signed)
Rx faxed for refill for Zolpidem. Rx sent to PCP for advise

## 2019-11-06 MED ORDER — ZOLPIDEM TARTRATE 10 MG PO TABS: 10.0000 mg | ORAL_TABLET | Freq: Every day | ORAL | 1 refills | Status: DC

## 2019-11-09 NOTE — Progress Notes (Signed)
Office Visit Note  Patient: Emily Ward             Date of Birth: 06-22-1956           MRN: [id number]             PCP: Laurey Morale, MD Referring: Laurey Morale, MD Visit Date: 11/16/2019 Occupation: _0 @  Subjective:  Fatigue   History of Present Illness: Emily Ward is a 64 y.o. female with history of fibromyalgia and DDD.  She takes cymbalta 30 mg 2 capsules daily which has been effective dose.  She continues to have intermittent muscle tenderness and muscle aches due to fibromyalgia.  She has not had any increase in frequency of flares recently.  She takes flexeril 10 mg TID prn for muscle spasms.  [facility] which has improved her symptoms significantly.  She states that her neck pain and stiffness has almost completely resolved.  She is also noticed improvement in her discomfort from TMJ dysfunction bilaterally.  She continues to have chronic fatigue secondary to insomnia. She takes ambien 10 mg po at bedtime for insomnia.  She has been having difficulty staying asleep.  She receives prolia injections every 6 months and takes a calcium and vitamin D supplement daily.   Activities of Daily Living:  Patient reports morning stiffness for 0 minutes.   Patient Reports nocturnal pain.  Difficulty dressing/grooming: Denies Difficulty climbing stairs: Denies Difficulty getting out of chair: Denies Difficulty using hands for taps, buttons, cutlery, and/or writing: Reports  Review of Systems  Constitutional: Positive for fatigue.  HENT: Positive for mouth dryness. Negative for mouth sores and nose dryness.   Eyes: Negative for pain, itching, visual disturbance and dryness.  Respiratory: Negative for cough, hemoptysis and difficulty breathing.   Cardiovascular: Negative for chest pain, palpitations, hypertension and swelling in legs/feet.  Gastrointestinal: Negative for blood in stool, constipation and diarrhea.    Endocrine: Negative for increased urination.  Genitourinary: Negative for difficulty urinating and painful urination.  Musculoskeletal: Positive for myalgias, muscle tenderness and myalgias. Negative for arthralgias, joint pain, joint swelling, muscle weakness and morning stiffness.  Skin: Positive for hair loss. Negative for color change, pallor, rash, nodules/bumps, skin tightness, ulcers and sensitivity to sunlight.  Allergic/Immunologic: Negative for susceptible to infections.  Neurological: Positive for headaches. Negative for dizziness, numbness, memory loss and weakness.  Hematological: Negative for swollen glands.  Psychiatric/Behavioral: Positive for sleep disturbance. Negative for depressed mood and confusion. The patient is not nervous/anxious.     PMFS History:  Patient Active Problem List   Diagnosis Date Noted  . Concussion 10/27/2018  . Postmenopausal bleeding 09/01/2018  . MGUS (monoclonal gammopathy of unknown significance) 02/13/2018  . COPD with emphysema ([place]) 10/04/2017  . Palpitations 07/30/2017  . Shortness of breath 07/30/2017  . Hormone replacement therapy (HRT) 03/30/2017  . Former smoker 03/30/2017  . Other fatigue 06/30/2016  . Insomnia 06/30/2016  . DJD (degenerative joint disease), cervical 06/30/2016  . 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 IBS (irritable bowel syndrome) 06/30/2016  . Interstitial cystitis 06/30/2016  . SHOULDER PAIN, RIGHT 09/05/2010  . GERD 03/24/2009  . SACROILIAC STRAIN 03/24/2009  . Atypical chest pain 01/20/2008  . HEMATURIA UNSPECIFIED 10/14/2007  . FIBROMYALGIA 07/03/2007  . HYPERLIPIDEMIA NEC/NOS 05/16/2007  . HEADACHE 05/09/2007    Past Medical History:  Diagnosis Date  . Cervical disc disease 2006  . COPD (chronic obstructive pulmonary disease) ([place])   .  Emphysema lung ([place])   . 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    .  Fibromyalgia    sees Dr. Estanislado Pandy   . Heart murmur   . 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 Hyperlipidemia   . Migraines   . MVP (mitral valve prolapse)    no daily medication  . Peptic ulcer    no problems    Family History  Problem Relation Age of Onset  . Breast cancer Mother        lumpectomy in late 50/60  . Heart attack Father   . Congestive Heart Failure Father   . Cancer Other        breast/fhx  . Heart disease Other        fhx  . Diabetes type II Sister        and ? brother  . Arthritis Son        psoriatic  . Colon cancer Neg Hx   . Esophageal cancer Neg Hx   . Rectal cancer Neg Hx   . Stomach cancer Neg Hx    Past Surgical History:  Procedure Laterality Date  . CERVICAL LAMINECTOMY  2006  . CESAREAN SECTION  1993  . CHOLECYSTECTOMY  06/2002  . COLONOSCOPY  06/06/2012   per Dr. Olevia Perches, benign polyp, repeat in 10 yrs   . CYSTOSCOPY N/A 09/01/2018   Procedure: CYSTOSCOPY;  Surgeon: Megan Salon, MD;  Location: [facility];  Service: Gynecology;  Laterality: N/A;  . DILATATION & CURETTAGE/HYSTEROSCOPY WITH MYOSURE N/A 04/12/2017   Procedure: DILATATION & CURETTAGE/HYSTEROSCOPY;  Surgeon: Megan Salon, MD;  Location: [facility];  Service: Gynecology;  Laterality: N/A;  . HYSTEROSCOPY     years ago with Dr Radene Knee-? 2000  . TOTAL LAPAROSCOPIC HYSTERECTOMY WITH SALPINGECTOMY Bilateral 09/01/2018   Procedure: TOTAL LAPAROSCOPIC HYSTERECTOMY WITH SALPINGECTOMY  BSO, REPAIR OF PERINEAL LACERATION;  Surgeon: Megan Salon, MD;  Location: [facility];  Service: Gynecology;  Laterality: Bilateral;  possible BSO  . URETHRAL DILATION     Social History   Social History Narrative  . Not on file   Immunization History  Administered Date(s) Administered  . Influenza Whole 06/30/2007, 05/04/2010  . Influenza,inj,Quad PF,6+ Mos 07/12/2017, 05/23/2018  . Influenza-Unspecified 06/02/2014, 06/03/2016  . MMR  06/30/2007  . Pneumococcal Polysaccharide-23 03/23/2019  . Td 09/03/1998, 05/05/2009  . Tdap 10/22/2018     Objective: Vital Signs: BP 118/66 (BP Location: Left Arm, Patient Position: Sitting, Cuff Size: Normal)   Pulse 74   Resp 13   Ht _0  (1.651 m)   Wt 115 lb 3.2 oz (52.3 kg)   LMP 09/03/2000 (Approximate)   BMI 19.17 kg/m    Physical Exam Vitals and nursing note reviewed.  Constitutional:      Appearance: She is well-developed.  HENT:     Head: Normocephalic and atraumatic.  Eyes:     Conjunctiva/sclera: Conjunctivae normal.  Pulmonary:     Effort: Pulmonary effort is normal.  Abdominal:     General: Bowel sounds are normal.     Palpations: Abdomen is soft.  Musculoskeletal:     Cervical back: Normal range of motion.  Lymphadenopathy:     Cervical: No cervical adenopathy.  Skin:    General: Skin is warm and dry.     Capillary Refill: Capillary refill takes less than 2 seconds.  Neurological:     Mental Status:  She is alert and oriented to person, place, and time.  Psychiatric:        Behavior: Behavior normal.      Musculoskeletal Exam: C-spine, thoracic spine, and lumbar spine good ROM.  Trapezius muscle tension bilaterally. Shoulder joints, elbow joints, wrist joints, MCPs, PIPs, and DIPs good ROM with no synovitis.  Complete fist formation bilaterally. Hip joints, knee joints, ankle joints, MTPs, PIPs, and DIPs good ROM with no synovitis.  No warmth or effusion of knee joints noted.  No tenderness or inflammation of ankle joints. No tenderness over trochanteric bursa bilaterally.  CDAI Exam: CDAI Score: -- Patient Global: --; Provider Global: -- Swollen: --; Tender: -- Joint Exam 11/16/2019   No joint exam has been documented for this visit   There is currently no information documented on the homunculus. Go to the Rheumatology activity and complete the homunculus joint exam.  Investigation: No additional findings.  Imaging: No results  found.  Recent Labs: Lab Results  Component Value Date   WBC 4.4 08/12/2019   HGB 13.7 08/12/2019   PLT 264.0 08/12/2019   NA 139 08/12/2019   K 3.8 08/12/2019   CL 104 08/12/2019   CO2 26 08/12/2019   GLUCOSE 84 08/12/2019   BUN 15 08/12/2019   CREATININE 0.59 08/12/2019   BILITOT 0.5 08/12/2019   ALKPHOS 88 08/12/2019   AST 23 08/12/2019   ALT 11 08/12/2019   PROT 6.9 08/12/2019   ALBUMIN 4.3 08/12/2019   CALCIUM 9.2 08/12/2019   GFRAA >60 10/22/2018    Speciality Comments: No specialty comments available.  Procedures:  No procedures performed Allergies: Depakote [divalproex sodium] and Nsaids   Assessment / Plan:     Visit Diagnoses: Fibromyalgia: She experiences intermittent generalized hyperalgesia and positive tender points.  She experiences trapezius muscle tenderness and tension at times.  She takes Flexeril 10 mg 3 times daily as needed for muscle spasms.  [facility] which provided significant pain relief.  She has been performing home exercises since her insurance will no longer cover integrative therapy visits.  She continues to take Cymbalta 30 mg 2 capsules by mouth daily which has been an effective dose.  She reports that she will be retiring in July 2021 and at that time would like to discuss trying to taper her dose of Cymbalta.  She continues to have chronic fatigue secondary to insomnia.  She takes Ambien 10 mg by mouth at bedtime to help her sleep.  We discussed the importance of regular exercise and good sleep hygiene.  She will follow-up in the office in 6 months.  Primary insomnia: She takes Ambien 10 mg 1 tablet by mouth at bedtime for insomnia.  She continues to have difficulty falling asleep as well as staying asleep at night.  We discussed the importance of regular exercise and good sleep hygiene.  Other fatigue: She has chronic fatigue secondary to insomnia.  According to the patient she was diagnosed with  COVID-19 in November 2020 and experienced severe fatigue for several weeks.  Her level fatigue has been improving.  She was encouraged to exercise on a regular basis.  TMJ (temporomandibular joint syndrome): No tenderness on exam today.  Her symptoms have improved since going to physical [facility].  Trapezius muscle spasm: She experiences trapezius muscle tension and muscle tenderness bilaterally.  She has occasional spasms and takes Flexeril 10 mg 3 times daily as needed.  She has been working on neck range  of motion exercises which has been improving her discomfort.  DDD (degenerative disc disease), cervical: She has good range of motion on exam.  She has no symptoms of radiculopathy at this time.  She has been performing neck exercises on a regular basis.  [facility] which improved her discomfort.  DDD (degenerative disc disease), lumbar: She has intermittent lower back pain.  No symptoms of radiculopathy.  She takes Flexeril 10 mg 1 tablet 3 times daily as needed for pain relief.  Age-related osteoporosis without current pathological fracture: DEXA on 12/13/17: The BMD measured at Femur Total Left is 0.516 g/cm2 with a T-score of -3.9.  She will be due to update DEXA in April 2021.  Dr. Sabra Heck has been ordering her bone densities in the past.  She receives Prolia subcutaneous injections once every 6 months and continues to take calcium and vitamin D supplements daily.  Other medical conditions are listed as follows:   Petechiae  History of gastroesophageal reflux (GERD)  History of IBS  Interstitial cystitis  Orders: No orders of the defined types were placed in this encounter.  No orders of the defined types were placed in this encounter.    Follow-Up Instructions: Return in about 6 months (around 05/18/2020) for Fibromyalgia, DDD.   Ofilia Neas, PA-C  Note - This record has been created using Dragon software.   Chart creation errors have been sought, but may not always  have been located. Such creation errors do not reflect on  the standard of medical care.

## 2019-11-16 ENCOUNTER — Encounter: Payer: Self-pay | Admitting: Physician Assistant

## 2019-11-16 ENCOUNTER — Other Ambulatory Visit: Payer: Self-pay

## 2019-11-16 ENCOUNTER — Ambulatory Visit (INDEPENDENT_AMBULATORY_CARE_PROVIDER_SITE_OTHER): Payer: 59 | Admitting: Physician Assistant

## 2019-11-16 VITALS — BP 118/66 | HR 74 | Resp 13 | Ht 65.0 in | Wt 115.2 lb

## 2019-11-16 DIAGNOSIS — F5101 Primary insomnia: Secondary | ICD-10-CM | POA: Diagnosis not present

## 2019-11-16 DIAGNOSIS — R5383 Other fatigue: Secondary | ICD-10-CM | POA: Diagnosis not present

## 2019-11-16 DIAGNOSIS — M62838 Other muscle spasm: Secondary | ICD-10-CM

## 2019-11-16 DIAGNOSIS — M797 Fibromyalgia: Secondary | ICD-10-CM | POA: Diagnosis not present

## 2019-11-16 DIAGNOSIS — Z8719 Personal history of other diseases of the digestive system: Secondary | ICD-10-CM

## 2019-11-16 DIAGNOSIS — M5136 Other intervertebral disc degeneration, lumbar region: Secondary | ICD-10-CM

## 2019-11-16 DIAGNOSIS — M51369 Other intervertebral disc degeneration, lumbar region without mention of lumbar back pain or lower extremity pain: Secondary | ICD-10-CM

## 2019-11-16 DIAGNOSIS — R233 Spontaneous ecchymoses: Secondary | ICD-10-CM

## 2019-11-16 DIAGNOSIS — M26609 Unspecified temporomandibular joint disorder, unspecified side: Secondary | ICD-10-CM

## 2019-11-16 DIAGNOSIS — N3010 Interstitial cystitis (chronic) without hematuria: Secondary | ICD-10-CM

## 2019-11-16 DIAGNOSIS — M810 Age-related osteoporosis without current pathological fracture: Secondary | ICD-10-CM

## 2019-11-16 DIAGNOSIS — M5030 Other cervical disc degeneration, unspecified cervical region: Secondary | ICD-10-CM

## 2019-12-16 ENCOUNTER — Telehealth: Payer: Self-pay | Admitting: Pulmonary Disease

## 2019-12-16 MED ORDER — ANORO ELLIPTA 62.5-25 MCG/INH IN AEPB: 1.0000 | INHALATION_SPRAY | Freq: Every day | RESPIRATORY_TRACT | 1 refills | Status: DC

## 2019-12-16 NOTE — Telephone Encounter (Signed)
Spoke with pt and advised rx for Anoro was sent to pharmacy. Nothing further is needed. Appt made for 01/11/2020 with Dr. Vaughan Browner

## 2020-01-11 ENCOUNTER — Ambulatory Visit (INDEPENDENT_AMBULATORY_CARE_PROVIDER_SITE_OTHER): Payer: 59 | Admitting: Pulmonary Disease

## 2020-01-11 ENCOUNTER — Encounter: Payer: Self-pay | Admitting: Pulmonary Disease

## 2020-01-11 ENCOUNTER — Other Ambulatory Visit: Payer: Self-pay

## 2020-01-11 VITALS — BP 118/68 | HR 62 | Temp 97.8°F | Ht 65.0 in | Wt 114.4 lb

## 2020-01-11 DIAGNOSIS — J449 Chronic obstructive pulmonary disease, unspecified: Secondary | ICD-10-CM | POA: Diagnosis not present

## 2020-01-11 NOTE — Progress Notes (Signed)
Emily Ward    SU:[id number]    09/16/1955  Primary Care Physician:Ward, Emily Holter, MD  Referring Physician: Laurey Morale, MD [place],  [place] [address]  Chief complaint: Follow-up for severe COPD  HPI: 64 year old with history of mitral valve prolapse, allergies, chronic headaches Here for follow-up of severe COPD Initially evaluated in December 2019.  Started on Anoro inhaler at that time Finished pulmonary rehab in 2020  Underwent total hysterectomy with salpingectomy on 09/01/2018 under general anesthesia without any respiratory issues.  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 Smoking history: 35-pack-year smoker.  Quit in 2014 Travel history: No significant travel history Relevant family history: Mom had emphysema Emily Ward was a smoker)  Interim history: Contracted COVID-19 in November 2020.  Symptoms are mostly fever, joint pain, headache and fatigue.  She did not have any respiratory symptoms except for throat irritation.  Did not need isolation  States that breathing is stable with mild dyspnea on exertion.  Continues on Anoro inhaler  Outpatient Encounter Medications as of 01/11/2020  Medication Sig  . albuterol (VENTOLIN HFA) 108 (90 Base) MCG/ACT inhaler Inhale 1-2 puffs into the lungs every 6 (six) hours as needed for wheezing or shortness of breath.  . butalbital-acetaminophen-caffeine (FIORICET) 50-325-40 MG tablet Take 1 tablet by mouth every 6 (six) hours as needed.  Marland Kitchen CALCIUM PO Take 1 tablet by mouth daily.  . cetirizine (ZYRTEC) 10 MG tablet Take 10 mg by mouth daily as needed for allergies (spring time).   . chlorzoxazone (PARAFON) 500 MG tablet TAKE 1 TABLET BY MOUTH FOUR TIMES DAILY AS NEEDED FOR MUSCLE SPASMS  . cholecalciferol (VITAMIN D) 1000 units tablet Take 2,000 Units by mouth daily.    . Cyanocobalamin (VITAMIN B-12 PO) Take 1 tablet by mouth daily.   . cyclobenzaprine (FLEXERIL) 10 MG tablet TAKE 1 TABLET BY MOUTH THREE TIMES DAILY AS NEEDED FOR MUSCLE SPASMS  . denosumab (PROLIA) 60 MG/ML SOSY injection Inject 60 mg into the skin every 6 (six) months.  . DULoxetine (CYMBALTA) 30 MG capsule Take 2 capsules (60 mg total) by mouth every morning.  Marland Kitchen estradiol (ESTRACE) 1 MG tablet Take 1 tablet (1 mg total) by mouth daily.  . meloxicam (MOBIC) 7.5 MG tablet TAKE 1 TABLET(7.5 MG) BY MOUTH DAILY  . umeclidinium-vilanterol (ANORO ELLIPTA) 62.5-25 MCG/INH AEPB Inhale 1 puff into the lungs daily.  Marland Kitchen zolmitriptan (ZOMIG) 5 MG tablet Take 1 tablet (5 mg total) by mouth as needed.  . zolpidem (AMBIEN) 10 MG tablet Take 1 tablet (10 mg total) by mouth at bedtime.   No facility-administered encounter medications on file as of 01/11/2020.   Physical Exam: Blood pressure 118/68, pulse 62, temperature 97.8 F (36.6 C), temperature source Temporal, height 5\' 5"  (1.651 m), weight 114 lb 6.4 oz (51.9 kg), last menstrual period 09/03/2000, SpO2 97 %. Gen:      No acute distress HEENT:  EOMI, sclera anicteric Neck:     No masses; no thyromegaly Lungs:    Clear to auscultation bilaterally; normal respiratory effort CV:         Regular rate and rhythm; no murmurs Abd:      + bowel sounds; soft, non-tender; no palpable masses, no distension Ext:    No edema; adequate peripheral perfusion Skin:      Warm and  dry; no rash Neuro: alert and oriented x 3 Psych: normal mood and affect  Data Reviewed: Imaging: Screening CT 04/27/2019-right lower lobe pulmonary nodule, emphysema, linear scarring in the lower lobes. I have reviewed the images personally.  PFTs: 03/05/2019 FVC 2.22 [64%), FEV1 1.43 (53%),F/F 64, TLC 6.78 [130%], DLCO 9.79 [46%] Severe obstruction with air trapping and hyperinflation Severe diffusion defect  Cardiac: Echo 08/21/17- LVEF 60-65%, normal wall thickness, normal wall  motion, normal diastolic function, normal LA size ,trivial MR, normal RV size with low normal RV systolic function, normal IVC.  Labs: CBC 10/22/2018-WBC 6.5, eos 1%, absolute eosinophil count 65 Alpha-1 antitrypsin 08/15/2018-126, PI MM  Assessment:  Severe COPD Continue Anoro inhaler.  Will likely not benefit from inhaled corticosteroid as peripherally eosinophils are low.  She would like to avoid additional medications as well  Did not desat on exertion.  Ex-smoker Continue low-dose screening CTs of the chest.  Health maintenance Pneumovax 03/23/2019  Plan/Recommendations: - Continue Anoro - Screening CTs of the chest  Emily Garfinkel MD [facility] 01/11/2020, 3:46 PM  CC: Emily Morale, MD

## 2020-01-11 NOTE — Patient Instructions (Signed)
Glad you are doing well with regard to your breathing Continue the Anoro inhaler You are scheduled for low-dose screening CT in August 2021 Follow-up in 1 year.

## 2020-02-08 ENCOUNTER — Telehealth: Payer: Self-pay | Admitting: Rheumatology

## 2020-02-08 NOTE — Telephone Encounter (Signed)
Per Hazel Sams, PA-C please schedule patient an appointment for further evaluation.

## 2020-02-08 NOTE — Telephone Encounter (Signed)
Patient having a lot of pain right arm from base of thumb into forearm. Pain has been going on for 3 weeks now. Patient not sure if she should come here for an evaluation, or go to her regular doctor. Please call to discuss.

## 2020-02-08 NOTE — Telephone Encounter (Signed)
Patient scheduled for 02/09/2020 at 2:20 pm.

## 2020-02-09 ENCOUNTER — Ambulatory Visit: Payer: Self-pay

## 2020-02-09 ENCOUNTER — Ambulatory Visit (INDEPENDENT_AMBULATORY_CARE_PROVIDER_SITE_OTHER): Payer: 59 | Admitting: Physician Assistant

## 2020-02-09 ENCOUNTER — Other Ambulatory Visit: Payer: Self-pay

## 2020-02-09 ENCOUNTER — Encounter: Payer: Self-pay | Admitting: Physician Assistant

## 2020-02-09 VITALS — BP 119/72 | HR 62 | Resp 13 | Ht 65.0 in | Wt 114.0 lb

## 2020-02-09 DIAGNOSIS — F5101 Primary insomnia: Secondary | ICD-10-CM | POA: Diagnosis not present

## 2020-02-09 DIAGNOSIS — R233 Spontaneous ecchymoses: Secondary | ICD-10-CM

## 2020-02-09 DIAGNOSIS — M5136 Other intervertebral disc degeneration, lumbar region: Secondary | ICD-10-CM

## 2020-02-09 DIAGNOSIS — M26609 Unspecified temporomandibular joint disorder, unspecified side: Secondary | ICD-10-CM | POA: Diagnosis not present

## 2020-02-09 DIAGNOSIS — M797 Fibromyalgia: Secondary | ICD-10-CM | POA: Diagnosis not present

## 2020-02-09 DIAGNOSIS — M79641 Pain in right hand: Secondary | ICD-10-CM | POA: Diagnosis not present

## 2020-02-09 DIAGNOSIS — M25531 Pain in right wrist: Secondary | ICD-10-CM | POA: Diagnosis not present

## 2020-02-09 DIAGNOSIS — M62838 Other muscle spasm: Secondary | ICD-10-CM

## 2020-02-09 DIAGNOSIS — M79642 Pain in left hand: Secondary | ICD-10-CM

## 2020-02-09 DIAGNOSIS — M810 Age-related osteoporosis without current pathological fracture: Secondary | ICD-10-CM

## 2020-02-09 DIAGNOSIS — M5030 Other cervical disc degeneration, unspecified cervical region: Secondary | ICD-10-CM

## 2020-02-09 DIAGNOSIS — R5383 Other fatigue: Secondary | ICD-10-CM

## 2020-02-09 DIAGNOSIS — N3010 Interstitial cystitis (chronic) without hematuria: Secondary | ICD-10-CM

## 2020-02-09 DIAGNOSIS — Z8719 Personal history of other diseases of the digestive system: Secondary | ICD-10-CM

## 2020-02-09 NOTE — Progress Notes (Signed)
Office Visit Note  Patient: Emily Ward             Date of Birth: [birth year]           MRN: [id number]             PCP: Laurey Morale, MD Referring: Laurey Morale, MD Visit Date: 02/09/2020 Occupation: _0 @  Subjective:  Right wrist pain   History of Present Illness: Emily Ward is a 64 y.o. female with history of fibromyalgia, DDD, and osteoporosis.  Patient reports that she has had less generalized myalgias and muscle tenderness since retiring.  She states that she has been under less stress and has been sleeping better at night.  She takes Ambien 10 mg half tablet by mouth at bedtime but states that she is going to try to taper off of Ambien.  She continues to take Cymbalta 30 mg 2 capsules every morning.  She is taking meloxicam 7.5 mg 1 tablet by mouth daily and Flexeril 1 tablet by mouth 3 times daily as needed for muscle spasms.  She feels as though this has been a good combination of medications.  She states that for the past 3 weeks she has been experiencing pain in her right wrist.  She states that she woke up with the discomfort.  She denies any overuse activities or injuries.  She states the pain is radiating through the flexor tendons of the right wrist.  She is also having pain in the right CMC and first MCP joint.  She denies any joint swelling.  She states that the meloxicam has not been helping with this discomfort.   Activities of Daily Living:  Patient reports morning stiffness for 0 minutes.   Patient Denies nocturnal pain.  Difficulty dressing/grooming: Denies Difficulty climbing stairs: Denies Difficulty getting out of chair: Denies Difficulty using hands for taps, buttons, cutlery, and/or writing: Reports  Review of Systems  Constitutional: Negative for fatigue.  HENT: Positive for mouth dryness. Negative for mouth sores and nose dryness.   Eyes: Negative for itching and dryness.  Respiratory: Negative for shortness of breath and  difficulty breathing.   Cardiovascular: Negative for chest pain and palpitations.  Gastrointestinal: Negative for blood in stool, constipation and diarrhea.  Endocrine: Negative for increased urination.  Genitourinary: Negative for painful urination.  Musculoskeletal: Positive for arthralgias, joint pain, myalgias, muscle tenderness and myalgias. Negative for joint swelling and morning stiffness.  Skin: Positive for color change. Negative for rash and redness.  Allergic/Immunologic: Negative for susceptible to infections.  Neurological: Positive for numbness, headaches and weakness. Negative for dizziness and memory loss.  Hematological: Positive for bruising/bleeding tendency.  Psychiatric/Behavioral: Negative for confusion.    PMFS History:  Patient Active Problem List   Diagnosis Date Noted  . Concussion 10/27/2018  . Postmenopausal bleeding 09/01/2018  . MGUS (monoclonal gammopathy of unknown significance) 02/13/2018  . COPD with emphysema ([place]) 10/04/2017  . Palpitations 07/30/2017  . Shortness of breath 07/30/2017  . Hormone replacement therapy (HRT) 03/30/2017  . Former smoker 03/30/2017  . Other fatigue 06/30/2016  . Insomnia 06/30/2016  . DJD (degenerative joint disease), cervical 06/30/2016  . 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 IBS (irritable bowel syndrome) 06/30/2016  . Interstitial cystitis 06/30/2016  . SHOULDER PAIN, RIGHT 09/05/2010  . GERD 03/24/2009  . SACROILIAC STRAIN 03/24/2009  . Atypical chest pain 01/20/2008  . HEMATURIA UNSPECIFIED 10/14/2007  . FIBROMYALGIA 07/03/2007  . HYPERLIPIDEMIA NEC/NOS 05/16/2007  .  HEADACHE 05/09/2007    Past Medical History:  Diagnosis Date  . Cervical disc disease 2006  . COPD (chronic obstructive pulmonary disease) ([place])   . Emphysema lung ([place])   . 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    . Fibromyalgia    sees Dr. Estanislado Pandy     . Heart murmur   . 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 Hyperlipidemia   . Migraines   . MVP (mitral valve prolapse)    no daily medication  . Peptic ulcer    no problems    Family History  Problem Relation Age of Onset  . Breast cancer Mother        lumpectomy in late 50/60  . Heart attack Father   . Congestive Heart Failure Father   . Cancer Other        breast/fhx  . Heart disease Other        fhx  . Diabetes type II Sister        and ? brother  . Arthritis Son        psoriatic  . Colon cancer Neg Hx   . Esophageal cancer Neg Hx   . Rectal cancer Neg Hx   . Stomach cancer Neg Hx    Past Surgical History:  Procedure Laterality Date  . CERVICAL LAMINECTOMY  2006  . CESAREAN SECTION  1993  . CHOLECYSTECTOMY  06/2002  . COLONOSCOPY  06/06/2012   per Dr. Olevia Perches, benign polyp, repeat in 10 yrs   . CYSTOSCOPY N/A 09/01/2018   Procedure: CYSTOSCOPY;  Surgeon: Megan Salon, MD;  Location: [facility];  Service: Gynecology;  Laterality: N/A;  . DILATATION & CURETTAGE/HYSTEROSCOPY WITH MYOSURE N/A 04/12/2017   Procedure: DILATATION & CURETTAGE/HYSTEROSCOPY;  Surgeon: Megan Salon, MD;  Location: [facility];  Service: Gynecology;  Laterality: N/A;  . HYSTEROSCOPY     years ago with Dr Radene Knee-? 2000  . TOTAL LAPAROSCOPIC HYSTERECTOMY WITH SALPINGECTOMY Bilateral 09/01/2018   Procedure: TOTAL LAPAROSCOPIC HYSTERECTOMY WITH SALPINGECTOMY  BSO, REPAIR OF PERINEAL LACERATION;  Surgeon: Megan Salon, MD;  Location: [facility];  Service: Gynecology;  Laterality: Bilateral;  possible BSO  . URETHRAL DILATION     Social History   Social History Narrative  . Not on file   Immunization History  Administered Date(s) Administered  . Influenza Whole 06/30/2007, 05/04/2010  . Influenza,inj,Quad PF,6+ Mos 07/12/2017, 05/23/2018  . Influenza-Unspecified 06/02/2014, 06/03/2016  . MMR 06/30/2007  . Moderna SARS-COVID-2  Vaccination 09/07/2019, 10/12/2019  . Pneumococcal Polysaccharide-23 03/23/2019  . Td 09/03/1998, 05/05/2009  . Tdap 10/22/2018     Objective: Vital Signs: BP 119/72 (BP Location: Left Arm, Patient Position: Sitting, Cuff Size: Normal)   Pulse 62   Resp 13   Ht _0  (1.651 m)   Wt 114 lb (51.7 kg)   LMP 09/03/2000 (Approximate)   BMI 18.97 kg/m    Physical Exam Vitals and nursing note reviewed.  Constitutional:      Appearance: She is well-developed.  HENT:     Head: Normocephalic and atraumatic.  Eyes:     Conjunctiva/sclera: Conjunctivae normal.  Pulmonary:     Effort: Pulmonary effort is normal.  Abdominal:     General: Bowel sounds are normal.     Palpations: Abdomen is soft.  Musculoskeletal:     Cervical back: Normal range of motion.  Lymphadenopathy:     Cervical: No  cervical adenopathy.  Skin:    General: Skin is warm and dry.     Capillary Refill: Capillary refill takes less than 2 seconds.  Neurological:     Mental Status: She is alert and oriented to person, place, and time.  Psychiatric:        Behavior: Behavior normal.      Musculoskeletal Exam: C-spine, thoracic spine, lumbar spine good range of motion.  Shoulder joints, elbow joints, wrist joints, MCPs, PIPs, DIPs good range of motion with no synovitis.  She has tenderness of the right CMC and right first MCP joint but no inflammation was noted.  She has tenderness to palpation over the flexor tendons of the right wrist.  Hip joints, knee joints, ankle joints, MTPs, PIPs, DIPs good range of motion with no synovitis.  No warmth or effusion of bilateral knee joints.  No tenderness or swelling of ankle joints.  No tenderness of MTP joints.  CDAI Exam: CDAI Score: -- Patient Global: --; Provider Global: -- Swollen: --; Tender: -- Joint Exam 02/09/2020   No joint exam has been documented for this visit   There is currently no information documented on the homunculus. Go to the Rheumatology activity and  complete the homunculus joint exam.  Investigation: No additional findings.  Imaging: No results found.  Recent Labs: Lab Results  Component Value Date   WBC 4.4 08/12/2019   HGB 13.7 08/12/2019   PLT 264.0 08/12/2019   NA 139 08/12/2019   K 3.8 08/12/2019   CL 104 08/12/2019   CO2 26 08/12/2019   GLUCOSE 84 08/12/2019   BUN 15 08/12/2019   CREATININE 0.59 08/12/2019   BILITOT 0.5 08/12/2019   ALKPHOS 88 08/12/2019   AST 23 08/12/2019   ALT 11 08/12/2019   PROT 6.9 08/12/2019   ALBUMIN 4.3 08/12/2019   CALCIUM 9.2 08/12/2019   GFRAA >60 10/22/2018    Speciality Comments: No specialty comments available.  Procedures:  No procedures performed Allergies: Depakote [divalproex sodium] and Nsaids   Assessment / Plan:     Visit Diagnoses: Fibromyalgia -She has intermittent myalgias and muscle tenderness due to underlying fibromyalgia.  She has ongoing tenderness of bilateral trapezius muscles.  We discussed importance of performing stretching exercises.  She continues to take Cymbalta 30 mg 2 capsules by mouth every morning and takes Flexeril 10 mg 3 times daily as needed for muscle spasms.  She has been taking meloxicam 7.5 mg 1 tablet daily for pain relief.  She recently retired and has been under less stress and has been sleeping better at night.  She has been taking Ambien 10 mg half tablet by mouth at bedtime but plans on trying to taper off of Ambien.  Her level of fatigue has been stable.  We discussed the importance of good sleep hygiene and regular exercise.  She plans on starting to perform yoga more regularly since she has more time now.  She will follow-up in the office in 6 months.  Other fatigue - Stable   Primary insomnia - She takes Ambien 10 mg 1/2 tablet by mouth at bedtime for insomnia.  She is going to try to taper off of Ambien.  Good sleep hygiene was discussed.  TMJ (temporomandibular joint syndrome): She experiences occasional discomfort due to TMJ  syndrome.  Trapezius muscle spasm: She has ongoing trapezius muscle tension and muscle tenderness bilaterally.  She has good range of motion of the C-spine on exam.  She is not experiencing any symptoms of radiculopathy.  She was encouraged to perform neck exercises on a daily basis.  She will continue taking Flexeril 10 mg 3 times daily as needed for muscle spasms.  Pain in both hands - She experiences intermittent discomfort in both hands.  For the past 3 weeks she has been experiencing increased discomfort in the right CMC joint and has noticed radiating pain into her right forearm.  She has tenderness to palpation of the right CMC joint on exam today.  No inflammation was noted.  She has complete fist formation bilaterally.  No signs of flexor tenosynovitis was noted.  X-rays of both hands were obtained today.  She was encouraged to use a right CMC joint brace and use Voltaren gel topically as needed for pain relief.  Plan: XR Hand 2 View Left, XR Hand 2 View Right  Pain in right wrist -She has been experiencing right wrist pain for the past 3 weeks.  She did not have any overuse activities or injuries prior to the onset of symptoms.  She has good range of motion of the right wrist on examination today.  She has tenderness over the right CMC joint.  No signs of flexor tenosynovitis noted on exam.  X-rays of the right hand/wrist were obtained today which were unremarkable.  She was encouraged to use a right CMC joint brace and use Voltaren gel topically as needed for pain relief.  She was advised to notify us if her symptoms persist or worsen.  Plan: XR Hand 2 View Right  DDD (degenerative disc disease), cervical: She has good range of motion of the C-spine on exam.  No symptoms of radiculopathy.  DDD (degenerative disc disease), lumbar She is not having any discomfort in her lower back at this time.  No midline spinal tenderness.  No symptoms of radiculopathy.  She is taking Flexeril 10 mg 1 tablet 3  times daily as needed for pain relief.  She plans on performing more yoga on a regular basis.   Age-related osteoporosis without current pathological fracture - DEXA on 12/13/17: The BMD measured at Femur Total Left is 0.516 g/cm2 with a T-score of -3.9.  She is overdue to update DEXA and was advised to discuss with her endocrinologist.  She continues to receive prolia 60 mg sq injections every 6 months.  She is taking calcium and vitamin D as recommended.    Other medical conditions are listed as follows:   History of gastroesophageal reflux (GERD)  History of IBS  Interstitial cystitis  Petechiae   Orders: Orders Placed This Encounter  Procedures  . XR Hand 2 View Left  . XR Hand 2 View Right   No orders of the defined types were placed in this encounter.   Face-to-face time spent with patient was 30 minutes. Greater than 50% of time was spent in counseling and coordination of care.  Follow-Up Instructions: Return in about 6 months (around 08/10/2020) for Fibromyalgia.   Ofilia Neas, PA-C  Note - This record has been created using Dragon software.  Chart creation errors have been sought, but may not always  have been located. Such creation errors do not reflect on  the standard of medical care.

## 2020-02-16 ENCOUNTER — Other Ambulatory Visit: Payer: Self-pay | Admitting: *Deleted

## 2020-02-16 MED ORDER — ANORO ELLIPTA 62.5-25 MCG/INH IN AEPB: 1.0000 | INHALATION_SPRAY | Freq: Every day | RESPIRATORY_TRACT | 12 refills | Status: DC

## 2020-03-11 NOTE — Progress Notes (Signed)
Office Visit Note  Patient: Emily Ward             Date of Birth: 1956/03/08           MRN: [id number]             PCP: Laurey Morale, MD Referring: Laurey Morale, MD Visit Date: 03/15/2020 Occupation: _0 @  Subjective:    History of Present Illness: Emily Ward is a 64 y.o. female with history of fibromyalgia and DDD.  Patient presents today with severe left-sided neck pain which started about 1 week ago.  Her discomfort has progressively been worsening.  She has been experiencing severe muscle spasms and has noticed some radiating pain to her left middle finger intermittently.  She reports she recently had an occipital migraine on the left side as well.  She had to take Zomig several days ago and has been taking Flexeril as needed for muscle spasms.  She continues to have stiffness and muscle tenderness in the trapezius region.  She denies any overuse activities recently.  She has not been under increased stress recently.  She has been trying to work on her posture and has been performing exercises which she learned at physical therapy in the past.  She would like a new referral to physical therapy.  Activities of Daily Living:  Patient reports morning stiffness for 0  minutes.   Patient Reports nocturnal pain.  Difficulty dressing/grooming: Denies Difficulty climbing stairs: Denies Difficulty getting out of chair: Denies Difficulty using hands for taps, buttons, cutlery, and/or writing: Reports  Review of Systems  Constitutional: Negative for fatigue.  HENT: Positive for mouth dryness. Negative for mouth sores and nose dryness.   Eyes: Negative for itching and dryness.  Respiratory: Negative for shortness of breath and difficulty breathing.   Cardiovascular: Negative for chest pain and palpitations.  Gastrointestinal: Negative for blood in stool, constipation and diarrhea.  Endocrine: Negative for increased urination.  Genitourinary: Negative for  difficulty urinating.  Musculoskeletal: Positive for arthralgias, joint pain, joint swelling, myalgias, muscle tenderness and myalgias. Negative for morning stiffness.  Skin: Positive for color change. Negative for rash and redness.  Allergic/Immunologic: Negative for susceptible to infections.  Neurological: Positive for dizziness, numbness, headaches and parasthesias. Negative for memory loss and weakness.  Hematological: Negative for bruising/bleeding tendency.  Psychiatric/Behavioral: Negative for confusion.    PMFS History:  Patient Active Problem List   Diagnosis Date Noted  . Concussion 10/27/2018  . Postmenopausal bleeding 09/01/2018  . MGUS (monoclonal gammopathy of unknown significance) 02/13/2018  . 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 Palpitations 07/30/2017  . Shortness of breath 07/30/2017  . Hormone replacement therapy (HRT) 03/30/2017  . Former smoker 03/30/2017  . Other fatigue 06/30/2016  . Insomnia 06/30/2016  . DJD (degenerative joint disease), cervical 06/30/2016  . 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 IBS (irritable bowel syndrome) 06/30/2016  . Interstitial cystitis 06/30/2016  . SHOULDER PAIN, RIGHT 09/05/2010  . GERD 03/24/2009  . SACROILIAC STRAIN 03/24/2009  . Atypical chest pain 01/20/2008  . HEMATURIA UNSPECIFIED 10/14/2007  . FIBROMYALGIA 07/03/2007  . HYPERLIPIDEMIA NEC/NOS 05/16/2007  . HEADACHE 05/09/2007    Past Medical History:  Diagnosis Date  . Cervical disc disease 2006  . COPD (chronic obstructive pulmonary disease) ([place])   . Emphysema lung ([place])   . 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    . Fibromyalgia  sees Dr. Estanislado Pandy   . Heart murmur   . 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 Hyperlipidemia   . Migraines   . MVP (mitral valve prolapse)    no daily medication  . Peptic ulcer    no problems    Family History  Problem  Relation Age of Onset  . Breast cancer Mother        lumpectomy in late 50/60  . Heart attack Father   . Congestive Heart Failure Father   . Cancer Other        breast/fhx  . Heart disease Other        fhx  . Diabetes type II Sister        and ? brother  . Arthritis Son        psoriatic  . Colon cancer Neg Hx   . Esophageal cancer Neg Hx   . Rectal cancer Neg Hx   . Stomach cancer Neg Hx    Past Surgical History:  Procedure Laterality Date  . CERVICAL LAMINECTOMY  2006  . CESAREAN SECTION  1993  . CHOLECYSTECTOMY  06/2002  . COLONOSCOPY  06/06/2012   per Dr. Olevia Perches, benign polyp, repeat in 10 yrs   . CYSTOSCOPY N/A 09/01/2018   Procedure: CYSTOSCOPY;  Surgeon: Megan Salon, MD;  Location: [facility];  Service: Gynecology;  Laterality: N/A;  . DILATATION & CURETTAGE/HYSTEROSCOPY WITH MYOSURE N/A 04/12/2017   Procedure: DILATATION & CURETTAGE/HYSTEROSCOPY;  Surgeon: Megan Salon, MD;  Location: [facility];  Service: Gynecology;  Laterality: N/A;  . HYSTEROSCOPY     years ago with Dr Radene Knee-? 2000  . TOTAL LAPAROSCOPIC HYSTERECTOMY WITH SALPINGECTOMY Bilateral 09/01/2018   Procedure: TOTAL LAPAROSCOPIC HYSTERECTOMY WITH SALPINGECTOMY  BSO, REPAIR OF PERINEAL LACERATION;  Surgeon: Megan Salon, MD;  Location[facility];  Service: Gynecology;  Laterality: Bilateral;  possible BSO  . URETHRAL DILATION     Social History   Social History Narrative  . Not on file   Immunization History  Administered Date(s) Administered  . Influenza Whole 06/30/2007, 05/04/2010  . Influenza,inj,Quad PF,6+ Mos 07/12/2017, 05/23/2018  . Influenza-Unspecified 06/02/2014, 06/03/2016  . MMR 06/30/2007  . Moderna SARS-COVID-2 Vaccination 09/07/2019, 10/12/2019  . Pneumococcal Polysaccharide-23 03/23/2019  . Td 09/03/1998, 05/05/2009  . Tdap 10/22/2018     Objective: Vital Signs: BP 113/74 (BP Location: Right Arm, Patient Position: Sitting,  Cuff Size: Normal)   Pulse 75   Resp 14   Ht 5' 5" (1.651 m)   Wt 116 lb (52.6 kg)   LMP 09/03/2000 (Approximate)   BMI 19.30 kg/m    Physical Exam Vitals and nursing note reviewed.  Constitutional:      Appearance: She is well-developed.  HENT:     Head: Normocephalic and atraumatic.  Eyes:     Conjunctiva/sclera: Conjunctivae normal.  Pulmonary:     Effort: Pulmonary effort is normal.  Abdominal:     General: Bowel sounds are normal.     Palpations: Abdomen is soft.  Musculoskeletal:     Cervical back: Normal range of motion.  Lymphadenopathy:     Cervical: No cervical adenopathy.  Skin:    General: Skin is warm and dry.     Capillary Refill: Capillary refill takes less than 2 seconds.  Neurological:     Mental Status: She is alert and oriented to person, place, and time.  Psychiatric:        Behavior: Behavior normal.  Musculoskeletal Exam: C-spine has limited range of motion with lateral rotation especially to the right.  She has tenderness and muscle tension of both trapezius muscles, more severe on the left side.  She has good flexion-extension of the C-spine.  Thoracic and lumbar spine have good range of motion.  Shoulder joints have good range of motion with no discomfort.  Elbow joints, wrist joints, MCPs, PIPs, and DIPs good range of motion with no synovitis.  She has complete fist formation bilaterally.  Hip joints have good range of motion with no discomfort.  Knee joints, ankle joints, MTPs, PIPs, DIPs good range of motion with no synovitis.  No warmth or effusion of knee joints noted.  No tenderness or inflammation of ankle joints were noted.  CDAI Exam: CDAI Score: -- Patient Global: --; Provider Global: -- Swollen: --; Tender: -- Joint Exam 03/15/2020   No joint exam has been documented for this visit   There is currently no information documented on the homunculus. Go to the Rheumatology activity and complete the homunculus joint  exam.  Investigation: No additional findings.  Imaging: XR Cervical Spine 2 or 3 views  Result Date: 03/15/2020 Multilevel spondylosis was noted.  Hardware was noted for C5-C6 fusion.  Mild C7-T1 spondylolisthesis was noted.  Facet joint arthropathy was noted.  No interval change was noted when compared to the x-rays of 2019. Impression: These findings are consistent with degenerative disc disease, C5-C6 fusion and facet joint arthropathy.   Recent Labs: Lab Results  Component Value Date   WBC 4.4 08/12/2019   HGB 13.7 08/12/2019   PLT 264.0 08/12/2019   NA 139 08/12/2019   K 3.8 08/12/2019   CL 104 08/12/2019   CO2 26 08/12/2019   GLUCOSE 84 08/12/2019   BUN 15 08/12/2019   CREATININE 0.59 08/12/2019   BILITOT 0.5 08/12/2019   ALKPHOS 88 08/12/2019   AST 23 08/12/2019   ALT 11 08/12/2019   PROT 6.9 08/12/2019   ALBUMIN 4.3 08/12/2019   CALCIUM 9.2 08/12/2019   GFRAA >60 10/22/2018    Speciality Comments: No specialty comments available.  Procedures:  No procedures performed Allergies: Depakote [divalproex sodium] and Nsaids   Assessment / Plan:     Visit Diagnoses: Fibromyalgia -She presents today with trapezius muscle tension and muscle tenderness bilaterally, left greater than right.  She has been taking Flexeril 10 mg 3 times daily as needed for muscle spasms.  She recently had an occipital migraine and had to take Zomig to resolve her symptoms.  She requested a referral to integrative therapies to help with the neck pain and muscle spasms she is currently experiencing.  She continues to take Cymbalta 30 mg 2 capsules by mouth every morning, which she feels continues to be helpful. She would like to continue on the current dose.  We discussed the importance of regular exercise and good sleep hygiene.  She will follow-up in the office in 6 months.   Primary insomnia - She takes Ambien 10 mg 1/2 tablet by mouth at bedtime for insomnia  Other fatigue: Chronic and  secondary to insomnia.  TMJ (temporomandibular joint syndrome): She has intermittent symptoms.  No discomfort at this time.  Trapezius muscle spasm - She presents today with trapezius muscle tension and muscle tenderness bilaterally.  She has been experiencing frequent muscle spasms.  She is also had an occipital migraine recently which is exacerbated her discomfort.  She declined trigger point injections today.  Updated x-rays of the C-spine were obtained.  She is taking Flexeril as needed for muscle spasms.  We will be referring her to integrative therapies for further management.  Plan: XR Cervical Spine 2 or 3 views  DDD (degenerative disc disease), cervical: X-rays of the C-spine were updated today and compared to x-rays from 2019 which did not reveal any progression.  She has been experiencing severe left-sided neck pain for the past 1 week.  A referral to integrative therapies was placed today.  She declined trigger point injections.  If her symptoms persist or worsen we will proceed with an MRI of the C-spine since she is been experiencing intermittent radiculopathy to the left third digit.  Neck pain -She presents today with severe left-sided neck pain which started about 1 week ago.  She did not have any recent injuries prior to the onset of symptoms.  She experienced an occipital migraine and had to take Zomig recently.  She has been experiencing severe muscle tension and tenderness of the left trapezius and has had spasms intermittently.  The spasms have been frequent despite taking Flexeril as needed.  She has also noticed some intermittent numbness and tingling down to her left third digit.  We updated x-rays of the C-spine today, which revealeed hardware for C5-C6 fusion, mild C7-T1 spondylolisthesis, andfacet joint arthropathy was noted.  There has been no radiographic changes when compared to x-rays from 2019.  She has gone to physical therapy at integrative therapies in the past which  provided significant benefit.  She would like a new referral to PT.  We also discussed a trigger point injection but she declined at this time.  If her symptoms persist or worsen we will proceed with scheduling an MRI of the C-spine. She was encouraged to use Biofreeze or Salonpas patches as needed for pain relief.  We also discussed the benefit of applying warm moist heat.  She will continue taking Flexeril as needed for muscle spasms. plan: XR Cervical Spine 2 or 3 views  DDD (degenerative disc disease), lumbar -She experiences intermittent lower back pain.  She is not having any lower back pain or muscle spasms at this time.  Flexeril 10 mg 1 tablet 3 times daily as needed for pain relief.  Age-related osteoporosis without current pathological fracture - DEXA on 12/13/17: The BMD measured at Femur Total Left is 0.516 g/cm2 with a T-score of -3.9.She continues to receive prolia 60 mg sq injections every 6 months.  She is due to update her bone density.  Other medical conditions are listed as follows:  History of IBS  History of gastroesophageal reflux (GERD)  Interstitial cystitis  Petechiae    Orders: Orders Placed This Encounter  Procedures  . XR Cervical Spine 2 or 3 views  . Ambulatory referral to Physical Therapy   No orders of the defined types were placed in this encounter.   Face-to-face time spent with patient was 30 minutes. Greater than 50% of time was spent in counseling and coordination of care.  Follow-Up Instructions: Return in about 6 months (around 09/15/2020) for Fibromyalgia, DDD.   Ofilia Neas, PA-C  Note - This record has been created using Dragon software.  Chart creation errors have been sought, but may not always  have been located. Such creation errors do not reflect on  the standard of medical care.

## 2020-03-15 ENCOUNTER — Other Ambulatory Visit: Payer: Self-pay | Admitting: *Deleted

## 2020-03-15 ENCOUNTER — Ambulatory Visit (INDEPENDENT_AMBULATORY_CARE_PROVIDER_SITE_OTHER): Payer: 59 | Admitting: Physician Assistant

## 2020-03-15 ENCOUNTER — Other Ambulatory Visit: Payer: Self-pay

## 2020-03-15 ENCOUNTER — Ambulatory Visit: Payer: Self-pay

## 2020-03-15 ENCOUNTER — Encounter: Payer: Self-pay | Admitting: Physician Assistant

## 2020-03-15 VITALS — BP 113/74 | HR 75 | Resp 14 | Ht 65.0 in | Wt 116.0 lb

## 2020-03-15 DIAGNOSIS — R5383 Other fatigue: Secondary | ICD-10-CM | POA: Diagnosis not present

## 2020-03-15 DIAGNOSIS — M5030 Other cervical disc degeneration, unspecified cervical region: Secondary | ICD-10-CM

## 2020-03-15 DIAGNOSIS — M797 Fibromyalgia: Secondary | ICD-10-CM | POA: Diagnosis not present

## 2020-03-15 DIAGNOSIS — F5101 Primary insomnia: Secondary | ICD-10-CM | POA: Diagnosis not present

## 2020-03-15 DIAGNOSIS — Z8719 Personal history of other diseases of the digestive system: Secondary | ICD-10-CM

## 2020-03-15 DIAGNOSIS — M542 Cervicalgia: Secondary | ICD-10-CM | POA: Diagnosis not present

## 2020-03-15 DIAGNOSIS — M5136 Other intervertebral disc degeneration, lumbar region: Secondary | ICD-10-CM

## 2020-03-15 DIAGNOSIS — M26609 Unspecified temporomandibular joint disorder, unspecified side: Secondary | ICD-10-CM | POA: Diagnosis not present

## 2020-03-15 DIAGNOSIS — R233 Spontaneous ecchymoses: Secondary | ICD-10-CM

## 2020-03-15 DIAGNOSIS — M810 Age-related osteoporosis without current pathological fracture: Secondary | ICD-10-CM

## 2020-03-15 DIAGNOSIS — M62838 Other muscle spasm: Secondary | ICD-10-CM

## 2020-03-15 DIAGNOSIS — N3010 Interstitial cystitis (chronic) without hematuria: Secondary | ICD-10-CM

## 2020-03-15 MED ORDER — DULOXETINE HCL 30 MG PO CPEP: 60.0000 mg | ORAL_CAPSULE | Freq: Every morning | ORAL | 0 refills | Status: DC

## 2020-03-15 NOTE — Telephone Encounter (Signed)
Last Visit: 03/15/2020 Next visit: 09/13/2020  Current Dose per office note on 03/15/2020: Cymbalta 30 mg 2 capsules by mouth every morning  Okay to refill per Dr. Deveshwar  

## 2020-04-18 ENCOUNTER — Telehealth: Payer: Self-pay | Admitting: Rheumatology

## 2020-04-18 DIAGNOSIS — M542 Cervicalgia: Secondary | ICD-10-CM

## 2020-04-18 NOTE — Telephone Encounter (Signed)
Ok to place order for MRI of the C-spine since she has had an inadequate response to conservative treatment and PT.

## 2020-04-18 NOTE — Telephone Encounter (Signed)
Patient advised order for MRI has been placed.

## 2020-04-18 NOTE — Telephone Encounter (Signed)
Patient called stating she saw Lovena Le a couple of weeks ago regarding her neck pain.  Patient states she has attended a couple of sessions of physical therapy and is still experiencing pain.  Patient is requesting a return call to let her know if the MRI can just be ordered or if another appointment is needed first.

## 2020-04-27 ENCOUNTER — Telehealth: Payer: Self-pay | Admitting: Rheumatology

## 2020-04-27 NOTE — Telephone Encounter (Signed)
Secondary insurance requires a PA for Cervical MRI. ( primary has already approved) Case still under review with secondary ([employer]). A peer to peer is needed to speed up the review. Please call [phone] to give further information.

## 2020-05-02 ENCOUNTER — Other Ambulatory Visit: Payer: Self-pay

## 2020-05-03 ENCOUNTER — Ambulatory Visit (INDEPENDENT_AMBULATORY_CARE_PROVIDER_SITE_OTHER): Payer: 59 | Admitting: Family Medicine

## 2020-05-03 ENCOUNTER — Encounter: Payer: Self-pay | Admitting: Family Medicine

## 2020-05-03 VITALS — BP 124/64 | Temp 97.7°F | Wt 116.0 lb

## 2020-05-03 DIAGNOSIS — N390 Urinary tract infection, site not specified: Secondary | ICD-10-CM | POA: Diagnosis not present

## 2020-05-03 DIAGNOSIS — W57XXXA Bitten or stung by nonvenomous insect and other nonvenomous arthropods, initial encounter: Secondary | ICD-10-CM

## 2020-05-03 DIAGNOSIS — R300 Dysuria: Secondary | ICD-10-CM

## 2020-05-03 LAB — POCT URINALYSIS DIPSTICK
Bilirubin, UA: NEGATIVE
Glucose, UA: NEGATIVE
Ketones, UA: NEGATIVE
Nitrite, UA: NEGATIVE
Protein, UA: POSITIVE — AB
Spec Grav, UA: 1.02 (ref 1.010–1.025)
Urobilinogen, UA: 0.2 E.U./dL
pH, UA: 7 (ref 5.0–8.0)

## 2020-05-03 MED ORDER — CIPROFLOXACIN HCL 500 MG PO TABS
500.0000 mg | ORAL_TABLET | Freq: Two times a day (BID) | ORAL | 0 refills | Status: DC
Start: 2020-05-03 — End: 2020-05-17

## 2020-05-03 NOTE — Progress Notes (Signed)
   Subjective:    Patient ID: Emily Ward, female    DOB: 1956/07/10, 64 y.o.   MRN: [id number]  HPI Here for 2 issues. First for the past 5 days she has had low back pain and burning with urinations. Some urgency as well, but no fever. Also in June she pulled a tick out of her left groin and over the next few days a red rash spread over the left thigh. This is itchy but not painful. The rash slowly faded after that, but some discoloration remains. She has fibromylagia so it id difficult to tell if her joint pains are worse after the tick bite or not but she is concerned about possible Lyme disease.    Review of Systems  Constitutional: Negative.   Respiratory: Negative.   Cardiovascular: Negative.   Genitourinary: Positive for dysuria, frequency and pelvic pain. Negative for flank pain and hematuria.  Musculoskeletal: Positive for arthralgias.  Skin: Positive for rash.       Objective:   Physical Exam Constitutional:      Appearance: Normal appearance.  Cardiovascular:     Rate and Rhythm: Normal rate and regular rhythm.     Pulses: Normal pulses.     Heart sounds: Normal heart sounds.  Pulmonary:     Effort: Pulmonary effort is normal.     Breath sounds: Normal breath sounds.  Abdominal:     General: Abdomen is flat. Bowel sounds are normal. There is no distension.     Palpations: Abdomen is soft. There is no mass.     Tenderness: There is no abdominal tenderness. There is no guarding or rebound.     Hernia: No hernia is present.  Skin:    Comments: There is a faint hyperpigmented area on the left inner thigh   Neurological:     Mental Status: She is alert.           Assessment & Plan:  For the UTI, we will treat with Cipro and culture the urine sample. Since she had a rash associated with a tick bite, we will check for Lyme antibodies.  Alysia Penna, MD

## 2020-05-04 LAB — B. BURGDORFI ANTIBODIES: B burgdorferi Ab IgG+IgM: <0.9 index

## 2020-05-05 LAB — URINE CULTURE
MICRO NUMBER:: 10893708
SPECIMEN QUALITY:: ADEQUATE

## 2020-05-06 ENCOUNTER — Telehealth: Payer: Self-pay | Admitting: *Deleted

## 2020-05-06 DIAGNOSIS — M5030 Other cervical disc degeneration, unspecified cervical region: Secondary | ICD-10-CM

## 2020-05-06 DIAGNOSIS — M5136 Other intervertebral disc degeneration, lumbar region: Secondary | ICD-10-CM

## 2020-05-06 DIAGNOSIS — M542 Cervicalgia: Secondary | ICD-10-CM

## 2020-05-06 NOTE — Telephone Encounter (Signed)
Patient advised we received MRI results. Reviewed with patient  Impression: Adjacent segment degeneration at C3-C4 and C6-C7 with left foraminal impingement at both levels, greater at C6-7. C7-T1 osteoarthritis with asymmetric left spurring and mild anterolisthesis. C5-6 ACDF with solid arthrodesis.   Recommendation refer to Dr. Ernestina Patches.  Referral placed.

## 2020-05-16 ENCOUNTER — Telehealth: Payer: Self-pay | Admitting: Physical Medicine and Rehabilitation

## 2020-05-16 NOTE — Telephone Encounter (Signed)
Patient called needing to schedule an appointment with Dr Ernestina Patches. Patient was referred by Dr Estanislado Pandy. The number to contact patient is [phone]

## 2020-05-17 ENCOUNTER — Other Ambulatory Visit: Payer: Self-pay

## 2020-05-17 ENCOUNTER — Encounter: Payer: Self-pay | Admitting: Family Medicine

## 2020-05-17 ENCOUNTER — Telehealth (INDEPENDENT_AMBULATORY_CARE_PROVIDER_SITE_OTHER): Payer: 59 | Admitting: Family Medicine

## 2020-05-17 VITALS — Temp 99.8°F | Wt 115.0 lb

## 2020-05-17 DIAGNOSIS — N390 Urinary tract infection, site not specified: Secondary | ICD-10-CM | POA: Diagnosis not present

## 2020-05-17 MED ORDER — NITROFURANTOIN MONOHYD MACRO 100 MG PO CAPS
100.0000 mg | ORAL_CAPSULE | Freq: Two times a day (BID) | ORAL | 0 refills | Status: DC
Start: 2020-05-17 — End: 2020-06-01

## 2020-05-17 NOTE — Telephone Encounter (Signed)
Called pt and sch for 06/01/20

## 2020-05-17 NOTE — Progress Notes (Signed)
   Subjective:    Patient ID: Emily Ward, female    DOB: [birth year], 64 y.o.   MRN: [id number]  HPI Virtual Visit via Telephone Note  I connected with the patient on 05/17/20 at 11:00 AM EDT by telephone and verified that I am speaking with the correct person using two identifiers.   I discussed the limitations, risks, security and privacy concerns of performing an evaluation and management service by telephone and the availability of in person appointments. I also discussed with the patient that there may be a patient responsible charge related to this service. The patient expressed understanding and agreed to proceed.  Location patient: home Location provider: work or home office Participants present for the call: patient, provider Patient did not have a visit in the prior 7 days to address this/these issue(s).   History of Present Illness: Here for what sounds like a residual UTI. We saw her on 05-03-20 for classic UTI symptoms, and we treated her with 7 days of Cipro. The culture grew Enterococcus faecalis and only 3 antibiotics were tested against it, none of which were quinolones. She seemed to respond and at the end of the 7 days she felt great. Now for the past few days she again has some lower back aches and an urgency to urinate. No fever. She drinks plenty of water.    Observations/Objective: Patient sounds cheerful and well on the phone. I do not appreciate any SOB. Speech and thought processing are grossly intact. Patient reported vitals:  Assessment and Plan: Partially treated UTI. This time she will take 7 days of Macrobid. Recheck prn.  Alysia Penna, MD   Follow Up Instructions:     [phone] 5-10 [phone] 11-20 [id number] 21-30 I did not refer this patient for an OV in the next 24 hours for this/these issue(s).  I discussed the assessment and treatment plan with the patient. The patient was provided an opportunity to ask questions and all were answered. The  patient agreed with the plan and demonstrated an understanding of the instructions.   The patient was advised to call back or seek an in-person evaluation if the symptoms worsen or if the condition fails to improve as anticipated.  I provided 12 minutes of non-face-to-face time during this encounter.   Alysia Penna, MD    Review of Systems     Objective:   Physical Exam        Assessment & Plan:

## 2020-05-18 ENCOUNTER — Ambulatory Visit: Payer: 59 | Admitting: Physician Assistant

## 2020-05-19 ENCOUNTER — Other Ambulatory Visit: Payer: Self-pay | Admitting: Family Medicine

## 2020-05-20 NOTE — Telephone Encounter (Signed)
Last OV: 05/17/20 Last refill: 10/16/19 #120/5 refills

## 2020-05-23 ENCOUNTER — Other Ambulatory Visit: Payer: Self-pay | Admitting: Family Medicine

## 2020-05-25 NOTE — Telephone Encounter (Signed)
Can you advise in Dr. Barbie Banner absence?

## 2020-05-26 NOTE — Telephone Encounter (Signed)
Can you refill this Rx also please?

## 2020-06-01 ENCOUNTER — Encounter: Payer: Self-pay | Admitting: Physical Medicine and Rehabilitation

## 2020-06-01 ENCOUNTER — Other Ambulatory Visit: Payer: Self-pay

## 2020-06-01 ENCOUNTER — Ambulatory Visit (INDEPENDENT_AMBULATORY_CARE_PROVIDER_SITE_OTHER): Payer: 59 | Admitting: Physical Medicine and Rehabilitation

## 2020-06-01 ENCOUNTER — Telehealth: Payer: Self-pay | Admitting: Physical Medicine and Rehabilitation

## 2020-06-01 VITALS — BP 113/75 | HR 66

## 2020-06-01 DIAGNOSIS — M47812 Spondylosis without myelopathy or radiculopathy, cervical region: Secondary | ICD-10-CM

## 2020-06-01 DIAGNOSIS — F411 Generalized anxiety disorder: Secondary | ICD-10-CM

## 2020-06-01 DIAGNOSIS — M542 Cervicalgia: Secondary | ICD-10-CM

## 2020-06-01 DIAGNOSIS — M797 Fibromyalgia: Secondary | ICD-10-CM

## 2020-06-01 DIAGNOSIS — Z981 Arthrodesis status: Secondary | ICD-10-CM

## 2020-06-01 DIAGNOSIS — M7918 Myalgia, other site: Secondary | ICD-10-CM

## 2020-06-01 MED ORDER — DIAZEPAM 5 MG PO TABS
ORAL_TABLET | ORAL | 0 refills | Status: DC
Start: 2020-06-01 — End: 2020-09-06

## 2020-06-01 NOTE — Telephone Encounter (Signed)
Done at 6pm

## 2020-06-01 NOTE — Progress Notes (Signed)
Neck pain. More on the left. Has pain with turning head. Sometimes goes down left arm and fingers feel numb. Numeric Pain Rating Scale and Functional Assessment Average Pain 4 Pain Right Now 3 My pain is constant, tingling and aching Pain is worse with: turning head, looking down for a long period of time Pain improves with: time   In the last MONTH (on 0-10 scale) has pain interfered with the following?  1. General activity like being  able to carry out your everyday physical activities such as walking, climbing stairs, carrying groceries, or moving a chair?  Rating(5)  2. Relation with others like being able to carry out your usual social activities and roles such as  activities at home, at work and in your community. Rating(5)  3. Enjoyment of life such that you have  been bothered by emotional problems such as feeling anxious, depressed or irritable?  Rating(7)

## 2020-06-01 NOTE — Telephone Encounter (Signed)
Patient called asking about update of valium being called in her her procedure tomorrow. Please send to [facility] on [address]. Please call patient when medication has been called in. Patient phone number is [phone].

## 2020-06-01 NOTE — Progress Notes (Signed)
Emily Ward - 64 y.o. female MRN [id number]  Date of birth: May 31, 1956  Office Visit Note: Visit Date: 06/01/2020 PCP: Laurey Morale, MD Referred by: Laurey Morale, MD  Subjective: Chief Complaint  Patient presents with  . Neck - Pain   HPI: Emily Ward is a 64 y.o. female who comes in today For new patient evaluation and management at the request of Dr. Cy Blamer and Hazel Sams , PA-C for severe worsening left-sided neck pain with occasional referral down the arm.  Patient has a history of prior C5-6 anterior cervical discectomy and fusion.  This was completed in 2006 by Dr. Christella Noa.  She reports several month history of worsening left-sided neck pain without specific trauma.  She reports worsening pain with turning to the left more than the right.  She reports occasional symptoms down the arm with some referral pattern into the middle 2 digits and more of a C7 distribution.  She is left-hand dominant and since that she writes left-handed but does everything else right-handed.  She has had no prior history of carpal tunnel syndrome or electrodiagnostic study.  She is been through physical therapy and continues with physical therapy and comprehensive treatment with [facility].  She has seen them off and on over the years and usually when she regroups with treatment there she does get significant improvement with her pain.  Onset of symptoms was at the end of June.  Again no specific trauma.  She has a history of fibromyalgia as well as myofascial pain syndrome we had a long talk today about the differences in those 2.  She is followed by Dr. Estanislado Pandy for her fibromyalgia.  After her history of worsening symptoms despite physical therapy and continued medication management she did have MRI of the cervical spine performed.  This MRI is reviewed with the patient today with imaging and spine models.  Report is below.  She has adjacent level disease with  left-sided foraminal narrowing above the fusion which is a moderate she has significant left-sided foraminal narrowing below the fusion at C6-7.  She has facet arthropathy at C6-7 and C7-T1.  No high-grade central stenosis.  Review of Systems  Musculoskeletal: Positive for neck pain.       Intermittent radicular type arm pain with paresthesia  All other systems reviewed and are negative.  Otherwise per HPI.  Assessment & Plan: Visit Diagnoses:  1. Cervicalgia   2. Cervical spondylosis without myelopathy   3. S/P cervical spinal fusion   4. Pre-operative anxiety   5. Fibromyalgia   6. Myofascial pain syndrome     Plan: Findings:  Chronic history of neck pain with history of prior ACDF at C5-6 remotely in 2006 now with adjacent level spondylosis above and below the fusion.  No high-grade central stenosis.  Typically does well with physical therapy and alternative treatments.  She does have a combination now of arthritic change foraminal narrowing but also myofascial pain and underlying fibromyalgia.  After talking with her at great length today we talked for more than 40 minutes about her condition and treatment.  I do think it is reasonable to complete a diagnostic and hopefully therapeutic left C6-7 facet joint block.  Would consider epidural injection.  Went over the risks and benefits of this.  Foraminal injections in the cervical spine do carry significantly more potential risk and at this point I would not really consider given her level of pain.  It does not look like she  has a current surgical condition I think we can hopefully help her.  Would consider trigger point injections as well depending on how well she does.  Should continue with current physical therapy and massage and current medications.  She will continue to follow-up with Dr. Estanislado Pandy for her fibromyalgia.  Injection would be done with fluoroscopic guidance.  Patient does have some level of preprocedure anxiety and we would  give her Valium which I did prescribe today.    Meds & Orders:  Meds ordered this encounter  Medications  . diazepam (VALIUM) 5 MG tablet    Sig: Take 1 by mouth 1 hour  pre-procedure with very light food. May bring 2nd tablet to appointment.    Dispense:  2 tablet    Refill:  0   No orders of the defined types were placed in this encounter.   Follow-up: Return for Left C6-7 facet joint block with thorascopic guidance.   Procedures: No procedures performed  No notes on file   Clinical History: MRI CERVICAL SPINE WITHOUT CONTRAST  TECHNIQUE: Multiplanar, multisequence MR imaging of the cervical spine was performed. No intravenous contrast was administered.  COMPARISON: 10/22/2018 cervical spine CT  FINDINGS: Alignment: Degenerative reversal of cervical lordosis with mild C7-T1 anterolisthesis.  Vertebrae: C5-6 ACDF with solid arthrodesis. No fracture, discitis, or visible degenerative inflammation.  Cord: Normal signal and morphology.  Posterior Fossa, vertebral arteries, paraspinal tissues: Negative  Disc levels:  C2-3: Unremarkable.  C3-4: Unremarkable.  C4-5: Disc narrowing with endplate ridging eccentric to the left where there uncovertebral spurring and moderate foraminal narrowing. Patent canal  C5-6: ACDF with solid arthrodesis and no detected impingement.  C6-7: Prominent leftward uncal vertebral ridging. Asymmetric left facet spurring. Left foraminal impingement is advanced.  C7-T1:Facet osteoarthritis with asymmetric left-sided spurring and mild anterolisthesis. Mild left foraminal narrowing based on axial slices. Patent canal  IMPRESSION: 1. Adjacent segment degeneration at C4-5 and C6-7 with left foraminal impingement at both levels, greater at C6-7. 2. C7-T1 facet osteoarthritis with asymmetric left spurring and mild anterolisthesis. 3. C5-6 ACDF with solid arthrodesis.   Electronically Signed By: Monte Fantasia M.D. On: 05/04/2020 11:27     She reports that she quit smoking about 7 years ago. Her smoking use included cigarettes. She has a 20.00 pack-year smoking history. She has never used smokeless tobacco. No results for input(s): HGBA1C, LABURIC in the last 8760 hours.  Objective:  VS:  HT:    WT:   BMI:     BP:113/75  HR:66bpm  TEMP: ( )  RESP:  Physical Exam Vitals and nursing note reviewed.  Constitutional:      General: She is not in acute distress.    Appearance: Normal appearance. She is not ill-appearing.  HENT:     Head: Normocephalic and atraumatic.     Right Ear: External ear normal.     Left Ear: External ear normal.  Eyes:     Extraocular Movements: Extraocular movements intact.  Cardiovascular:     Rate and Rhythm: Normal rate.     Pulses: Normal pulses.  Abdominal:     General: There is no distension.     Palpations: Abdomen is soft.  Musculoskeletal:        General: Tenderness present.     Cervical back: Neck supple. Tenderness present. No rigidity.     Right lower leg: No edema.     Left lower leg: No edema.     Comments: Patient has good strength in the upper extremities including  5 out of 5 strength in wrist extension long finger flexion and APB.  There is no atrophy of the hands intrinsically.  There is a negative Hoffmann's test.  There is a negative Phalen's test bilaterally.  She does have mild shoulder impingement on the left more than right.  She does have active trigger points in the paraspinal region of the mid back including multifidus I and rhomboids.  Some trigger points across the supraspinatus as well.  She has some tender points across the upper back.  Her pain is worse in end ranges of motion of rotation to the left more than right of the cervical spine.   Lymphadenopathy:     Cervical: No cervical adenopathy.  Skin:    Findings: No erythema, lesion or rash.  Neurological:     General: No focal deficit present.     Mental Status: She is alert and oriented to person, place,  and time.     Sensory: No sensory deficit.     Motor: No weakness or abnormal muscle tone.     Coordination: Coordination normal.     Gait: Gait normal.  Psychiatric:        Mood and Affect: Mood normal.        Behavior: Behavior normal.     Ortho Exam  Imaging: No results found.  Past Medical/Family/Surgical/Social History: Medications & Allergies reviewed per EMR, new medications updated. Patient Active Problem List   Diagnosis Date Noted  . Concussion 10/27/2018  . Postmenopausal bleeding 09/01/2018  . MGUS (monoclonal gammopathy of unknown significance) 02/13/2018  . 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 Palpitations 07/30/2017  . Shortness of breath 07/30/2017  . Hormone replacement therapy (HRT) 03/30/2017  . Former smoker 03/30/2017  . Other fatigue 06/30/2016  . Insomnia 06/30/2016  . DJD (degenerative joint disease), cervical 06/30/2016  . 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 IBS (irritable bowel syndrome) 06/30/2016  . Interstitial cystitis 06/30/2016  . SHOULDER PAIN, RIGHT 09/05/2010  . GERD 03/24/2009  . SACROILIAC STRAIN 03/24/2009  . Atypical chest pain 01/20/2008  . HEMATURIA UNSPECIFIED 10/14/2007  . FIBROMYALGIA 07/03/2007  . HYPERLIPIDEMIA NEC/NOS 05/16/2007  . HEADACHE 05/09/2007   Past Medical History:  Diagnosis Date  . Cervical disc disease 2006  . COPD (chronic obstructive pulmonary disease) ([place])   . Emphysema lung ([place])   . 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    . Fibromyalgia    sees Dr. Estanislado Pandy   . Heart murmur   . 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 Hyperlipidemia   . Migraines   . MVP (mitral valve prolapse)    no daily medication  . Peptic ulcer    no problems   Family History  Problem Relation Age of Onset  . Breast cancer Mother        lumpectomy in late 50/60  . Heart attack Father   . Congestive Heart Failure Father     . Cancer Other        breast/fhx  . Heart disease Other        fhx  . Diabetes type II Sister        and ? brother  . Arthritis Son        psoriatic  . Colon cancer Neg Hx   . Esophageal cancer Neg Hx   . Rectal cancer Neg Hx   . Stomach cancer Neg Hx  Past Surgical History:  Procedure Laterality Date  . CERVICAL LAMINECTOMY  2006  . CESAREAN SECTION  1993  . CHOLECYSTECTOMY  06/2002  . COLONOSCOPY  06/06/2012   per Dr. Olevia Perches, benign polyp, repeat in 10 yrs   . CYSTOSCOPY N/A 09/01/2018   Procedure: CYSTOSCOPY;  Surgeon: Megan Salon, MD;  [facility];  Service: Gynecology;  Laterality: N/A;  . DILATATION & CURETTAGE/HYSTEROSCOPY WITH MYOSURE N/A 04/12/2017   Procedure: DILATATION & CURETTAGE/HYSTEROSCOPY;  Surgeon: Megan Salon, MD;  Location: [facility];  Service: Gynecology;  Laterality: N/A;  . HYSTEROSCOPY     years ago with Dr Radene Knee-? 2000  . TOTAL LAPAROSCOPIC HYSTERECTOMY WITH SALPINGECTOMY Bilateral 09/01/2018   Procedure: TOTAL LAPAROSCOPIC HYSTERECTOMY WITH SALPINGECTOMY  BSO, REPAIR OF PERINEAL LACERATION;  Surgeon: Megan Salon, MD;  Location: [facility];  Service: Gynecology;  Laterality: Bilateral;  possible BSO  . URETHRAL DILATION     Social History   Occupational History  . Not on file  Tobacco Use  . Smoking status: Former Smoker    Packs/day: 1.00    Years: 20.00    Pack years: 20.00    Types: Cigarettes    Quit date: 04/02/2013    Years since quitting: 7.1  . Smokeless tobacco: Never Used  Vaping Use  . Vaping Use: Never used  Substance and Sexual Activity  . Alcohol use: Yes    Alcohol/week: 1.0 standard drink    Types: 1 Glasses of wine per week    Comment: occ  . Drug use: Never  . Sexual activity: Never    Partners: Male

## 2020-06-01 NOTE — Telephone Encounter (Signed)
Please Advise

## 2020-06-02 ENCOUNTER — Ambulatory Visit: Payer: Self-pay

## 2020-06-02 ENCOUNTER — Ambulatory Visit (INDEPENDENT_AMBULATORY_CARE_PROVIDER_SITE_OTHER): Payer: 59 | Admitting: Physical Medicine and Rehabilitation

## 2020-06-02 ENCOUNTER — Encounter: Payer: Self-pay | Admitting: Physical Medicine and Rehabilitation

## 2020-06-02 VITALS — BP 105/73 | HR 75

## 2020-06-02 DIAGNOSIS — M542 Cervicalgia: Secondary | ICD-10-CM

## 2020-06-02 DIAGNOSIS — M47812 Spondylosis without myelopathy or radiculopathy, cervical region: Secondary | ICD-10-CM

## 2020-06-02 MED ORDER — METHYLPREDNISOLONE ACETATE 80 MG/ML IJ SUSP
40.0000 mg | Freq: Once | INTRAMUSCULAR | Status: AC
  Administered 2020-06-02: 40 mg

## 2020-06-02 NOTE — Progress Notes (Signed)
Pt state neck pain. Pt sate moving and lifting things makes the pain worse. Pt state some excise and heating pads helps ease the pain.  Numeric Pain Rating Scale and Functional Assessment Average Pain 8   In the last MONTH (on 0-10 scale) has pain interfered with the following?  1. General activity like being  able to carry out your everyday physical activities such as walking, climbing stairs, carrying groceries, or moving a chair?  Rating(8)   +Driver, -BT, -Dye Allergies.

## 2020-06-06 ENCOUNTER — Encounter: Payer: Self-pay | Admitting: Physical Medicine and Rehabilitation

## 2020-06-06 NOTE — Progress Notes (Signed)
Emily Ward - 64 y.o. female MRN [id number]  Date of birth: [birth year]  Office Visit Note: Visit Date: 06/02/2020 PCP: Laurey Morale, MD Referred by: Laurey Morale, MD  Subjective: Chief Complaint  Patient presents with  . Neck - Pain   HPI:  Emily Ward is a 64 y.o. female who comes in today at the request of Dr. Laurence Spates for planned Left C6-7 Cervical facet/medial branch block with fluoroscopic guidance.  The patient has failed conservative care including home exercise, medications, time and activity modification.  This injection will be diagnostic and hopefully therapeutic.  Please see requesting physician notes for further details and justification.  Exam has shown concordant pain with facet joint loading.  ROS Otherwise per HPI.  Assessment & Plan: Visit Diagnoses:  1. Cervical spondylosis without myelopathy   2. Cervicalgia     Plan: No additional findings.   Meds & Orders:  Meds ordered this encounter  Medications  . methylPREDNISolone acetate (DEPO-MEDROL) injection 40 mg    Orders Placed This Encounter  Procedures  . Facet Injection  . XR C-ARM NO REPORT    Follow-up: Return if symptoms worsen or fail to improve.   Procedures: No procedures performed  Diagnostic Cervical Facet Joint Nerve Block with Fluoroscopic Guidance  Patient: Emily Ward      Date of Birth: 03/27/1956 MRN: [id number] PCP: Laurey Morale, MD      Visit Date: 06/02/2020   Universal Protocol:    Date/Time: 10/04/214:52 AM  Consent Given By: the patient  Position: PRONE  Additional Comments: Vital signs were monitored before and after the procedure. Patient was prepped and draped in the usual sterile fashion. The correct patient, procedure, and site was verified.   Injection Procedure Details:  Procedure Site One Meds Administered:  Meds ordered this encounter  Medications  . methylPREDNISolone acetate (DEPO-MEDROL) injection 40 mg      Laterality: Left  Location/Site:  C6-7  Needle size: 25 G  Needle type: Spinal  Needle Placement: Articular Pillar  Findings:  -Contrast Used: 0.5 mL iohexol 180 mg iodine/mL   -Comments: Excellent flow of contrast across the articular pillars without intravascular flow  Procedure Details: The fluoroscope beam was positioned to square off the endplates of the desired vertebral level to achieve a true AP position. The beam was then moved in a small "counter" oblique to the contralateral side with a small amount of caudal tilt to achieve a trajectory alignment with the desired nerves.  For each target described below the skin was anesthetized with 1 ml of 1% Lidocaine without epinephrine.   To block the facet joint nerves from C3 through C7, the lateral masses of these respective levels were localized under fluoroscopic visualization.  A spinal needle was inserted down to the "waist" at the above mentioned cervical levels.  The  needle was then "walked off" until it rested just lateral to the trough of the lateral mass of the medial branch nerve, which innervates the cervical facet joint.  After contact with periosteum and negative aspirate for blood and CSF, correct placement without intravascular or epidural spread was confirmed by Bi-planar images and  injecting 0.5 ml. of Omnipaque-240.  A spot radiograph was obtained of this image.  Next, a 0.5 ml. volume of 1% Lidocaine without Epinephrine was then injected.  Prior to the procedure, the patient was given a Pain Diary which was completed for baseline measurements.  After the procedure, the patient rated their pain every  30 minutes and will continue rating at this frequency for a total of 5 hours.  The patient has been asked to complete the Diary and return to [place] by mail, fax or hand delivered as soon as possible.   Additional Comments:  The patient tolerated the procedure well Dressing: Band-Aid    Post-procedure  details: Patient was observed during the procedure. Post-procedure instructions were reviewed.  Patient left the clinic in stable condition.       Clinical History: MRI CERVICAL SPINE WITHOUT CONTRAST  TECHNIQUE: Multiplanar, multisequence MR imaging of the cervical spine was performed. No intravenous contrast was administered.  COMPARISON: 10/22/2018 cervical spine CT  FINDINGS: Alignment: Degenerative reversal of cervical lordosis with mild C7-T1 anterolisthesis.  Vertebrae: C5-6 ACDF with solid arthrodesis. No fracture, discitis, or visible degenerative inflammation.  Cord: Normal signal and morphology.  Posterior Fossa, vertebral arteries, paraspinal tissues: Negative  Disc levels:  C2-3: Unremarkable.  C3-4: Unremarkable.  C4-5: Disc narrowing with endplate ridging eccentric to the left where there uncovertebral spurring and moderate foraminal narrowing. Patent canal  C5-6: ACDF with solid arthrodesis and no detected impingement.  C6-7: Prominent leftward uncal vertebral ridging. Asymmetric left facet spurring. Left foraminal impingement is advanced.  C7-T1:Facet osteoarthritis with asymmetric left-sided spurring and mild anterolisthesis. Mild left foraminal narrowing based on axial slices. Patent canal  IMPRESSION: 1. Adjacent segment degeneration at C4-5 and C6-7 with left foraminal impingement at both levels, greater at C6-7. 2. C7-T1 facet osteoarthritis with asymmetric left spurring and mild anterolisthesis. 3. C5-6 ACDF with solid arthrodesis.   Electronically Signed By: Monte Fantasia M.D. On: 05/04/2020 11:27     Objective:  VS:  HT:    WT:   BMI:     BP:105/73  HR:75bpm  TEMP: ( )  RESP:  Physical Exam Vitals and nursing note reviewed.  Constitutional:      General: She is not in acute distress.    Appearance: Normal appearance. She is not ill-appearing.  HENT:     Head: Normocephalic and atraumatic.     Right Ear: External  ear normal.     Left Ear: External ear normal.  Eyes:     Extraocular Movements: Extraocular movements intact.  Cardiovascular:     Rate and Rhythm: Normal rate.     Pulses: Normal pulses.  Musculoskeletal:     Cervical back: Tenderness present. No rigidity.     Right lower leg: No edema.     Left lower leg: No edema.     Comments: Patient has good strength in the upper extremities including 5 out of 5 strength in wrist extension long finger flexion and APB.  There is no atrophy of the hands intrinsically.  There is a negative Hoffmann's test.   Lymphadenopathy:     Cervical: No cervical adenopathy.  Skin:    Findings: No erythema, lesion or rash.  Neurological:     General: No focal deficit present.     Mental Status: She is alert and oriented to person, place, and time.     Sensory: No sensory deficit.     Motor: No weakness or abnormal muscle tone.     Coordination: Coordination normal.  Psychiatric:        Mood and Affect: Mood normal.        Behavior: Behavior normal.      Imaging: No results found.

## 2020-06-06 NOTE — Procedures (Signed)
Diagnostic Cervical Facet Joint Nerve Block with Fluoroscopic Guidance  Patient: Emily Ward      Date of Birth: [birth year] MRN: [id number] PCP: Laurey Morale, MD      Visit Date: 06/02/2020   Universal Protocol:    Date/Time: 10/04/214:52 AM  Consent Given By: the patient  Position: PRONE  Additional Comments: Vital signs were monitored before and after the procedure. Patient was prepped and draped in the usual sterile fashion. The correct patient, procedure, and site was verified.   Injection Procedure Details:  Procedure Site One Meds Administered:  Meds ordered this encounter  Medications  . methylPREDNISolone acetate (DEPO-MEDROL) injection 40 mg     Laterality: Left  Location/Site:  C6-7  Needle size: 25 G  Needle type: Spinal  Needle Placement: Articular Pillar  Findings:  -Contrast Used: 0.5 mL iohexol 180 mg iodine/mL   -Comments: Excellent flow of contrast across the articular pillars without intravascular flow  Procedure Details: The fluoroscope beam was positioned to square off the endplates of the desired vertebral level to achieve a true AP position. The beam was then moved in a small "counter" oblique to the contralateral side with a small amount of caudal tilt to achieve a trajectory alignment with the desired nerves.  For each target described below the skin was anesthetized with 1 ml of 1% Lidocaine without epinephrine.   To block the facet joint nerves from C3 through C7, the lateral masses of these respective levels were localized under fluoroscopic visualization.  A spinal needle was inserted down to the "waist" at the above mentioned cervical levels.  The  needle was then "walked off" until it rested just lateral to the trough of the lateral mass of the medial branch nerve, which innervates the cervical facet joint.  After contact with periosteum and negative aspirate for blood and CSF, correct placement without intravascular or  epidural spread was confirmed by Bi-planar images and  injecting 0.5 ml. of Omnipaque-240.  A spot radiograph was obtained of this image.  Next, a 0.5 ml. volume of 1% Lidocaine without Epinephrine was then injected.  Prior to the procedure, the patient was given a Pain Diary which was completed for baseline measurements.  After the procedure, the patient rated their pain every 30 minutes and will continue rating at this frequency for a total of 5 hours.  The patient has been asked to complete the Diary and return to [place] by mail, fax or hand delivered as soon as possible.   Additional Comments:  The patient tolerated the procedure well Dressing: Band-Aid    Post-procedure details: Patient was observed during the procedure. Post-procedure instructions were reviewed.  Patient left the clinic in stable condition.

## 2020-06-07 ENCOUNTER — Ambulatory Visit (INDEPENDENT_AMBULATORY_CARE_PROVIDER_SITE_OTHER): Payer: 59 | Admitting: Physical Medicine and Rehabilitation

## 2020-06-07 ENCOUNTER — Other Ambulatory Visit: Payer: Self-pay

## 2020-06-07 VITALS — BP 115/77 | HR 68

## 2020-06-07 DIAGNOSIS — L244 Irritant contact dermatitis due to drugs in contact with skin: Secondary | ICD-10-CM | POA: Diagnosis not present

## 2020-06-07 NOTE — Progress Notes (Signed)
Pt complaints about a rash on her back of her neck. Pt state Friday morning pt wake up to what she said felt like a rash or hives. Pt state it felt like a bug bit. Pt state it had a ferried all around her neck.  Pt state it was very itch and red. Pt has an red bump right on center of her back at her neck. Pt had no drainage of the site.  Pt state she felt dizziness on Saturday.

## 2020-06-27 ENCOUNTER — Other Ambulatory Visit: Payer: Self-pay | Admitting: Adult Health

## 2020-06-27 ENCOUNTER — Other Ambulatory Visit: Payer: Self-pay | Admitting: Rheumatology

## 2020-06-28 NOTE — Telephone Encounter (Signed)
Last Visit: 03/15/2020 Next visit: 09/13/2020  Current Dose per office note on 03/15/2020: Cymbalta 30 mg 2 capsules by mouth every morning  Okay to refill per Dr. Estanislado Pandy

## 2020-07-10 ENCOUNTER — Encounter: Payer: Self-pay | Admitting: Physical Medicine and Rehabilitation

## 2020-07-10 NOTE — Progress Notes (Signed)
Emily Ward - 64 y.o. female MRN [id number]  Date of birth: Jan 18, 1956  Office Visit Note: Visit Date: 06/07/2020 PCP: Laurey Morale, MD Referred by: Laurey Morale, MD  Subjective: Chief Complaint  Patient presents with  . Neck - Pain  . Dizziness   HPI: Emily Ward is a 65 y.o. female who comes in today For evaluation management of localized rash or skin eruption that she feels was near the injection site of her cervical facet joint block.  We completed left C6-7 cervical facet joint block on 06/02/2020 really without any difficulty.  She did phone [place] on 10 4 with details in the phone conversation of localized redness particular right around the injection site.  No sign of systemic hives or swelling or shortness of breath etc.  She has known allergies of Depakote and nonsteroidal anti-inflammatories.  She reports that she felt like she had a bug bite and this really started Friday morning with some itchiness.  At this point she reports really only a red bump right at the center of her back at her neck.  She did report any drainage.  No fevers chills or headaches.  Review of Systems  Skin: Positive for itching and rash.  All other systems reviewed and are negative.  Otherwise per HPI.  Assessment & Plan: Visit Diagnoses:  1. Irritant contact dermatitis due to drug in contact with skin     Plan: Findings:  Very localized feeling of itchy and redness around potentially the site of injection although today on exam I do not really see very much.  There can be allergies to chlorhexidine and the ChloraPrep that we used to clean the patient off.  This is usually more of a localized drug type contact dermatitis and is usually fairly widespread exactly where we cleaned the patient off.  This seems to be may be more related to localized issue with ethyl chloride which is the "cold spray ".  Nonetheless everything looks well at this point I think this should be self-limiting.   She will let us know if things change.  Obviously allergies to contrast dye are notable but this would not be a localized skin reaction.    Meds & Orders: No orders of the defined types were placed in this encounter.  No orders of the defined types were placed in this encounter.   Follow-up: Return if symptoms worsen or fail to improve.   Procedures: No procedures performed  No notes on file   Clinical History: MRI CERVICAL SPINE WITHOUT CONTRAST  TECHNIQUE: Multiplanar, multisequence MR imaging of the cervical spine was performed. No intravenous contrast was administered.  COMPARISON: 10/22/2018 cervical spine CT  FINDINGS: Alignment: Degenerative reversal of cervical lordosis with mild C7-T1 anterolisthesis.  Vertebrae: C5-6 ACDF with solid arthrodesis. No fracture, discitis, or visible degenerative inflammation.  Cord: Normal signal and morphology.  Posterior Fossa, vertebral arteries, paraspinal tissues: Negative  Disc levels:  C2-3: Unremarkable.  C3-4: Unremarkable.  C4-5: Disc narrowing with endplate ridging eccentric to the left where there uncovertebral spurring and moderate foraminal narrowing. Patent canal  C5-6: ACDF with solid arthrodesis and no detected impingement.  C6-7: Prominent leftward uncal vertebral ridging. Asymmetric left facet spurring. Left foraminal impingement is advanced.  C7-T1:Facet osteoarthritis with asymmetric left-sided spurring and mild anterolisthesis. Mild left foraminal narrowing based on axial slices. Patent canal  IMPRESSION: 1. Adjacent segment degeneration at C4-5 and C6-7 with left foraminal impingement at both levels, greater at C6-7. 2. C7-T1 facet  osteoarthritis with asymmetric left spurring and mild anterolisthesis. 3. C5-6 ACDF with solid arthrodesis.   Electronically Signed By: Monte Fantasia M.D. On: 05/04/2020 11:27   She reports that she quit smoking about 7 years ago. Her smoking use included  cigarettes. She has a 20.00 pack-year smoking history. She has never used smokeless tobacco. No results for input(s): HGBA1C, LABURIC in the last 8760 hours.  Objective:  VS:  HT:    WT:   BMI:     BP:115/77  HR:68bpm  TEMP: ( )  RESP:  Physical Exam Musculoskeletal:     Cervical back: Normal range of motion and neck supple. Tenderness present. No rigidity.  Skin:    General: Skin is warm and dry.     Coloration: Skin is not jaundiced.     Findings: Erythema present. No bruising, lesion or rash.     Ortho Exam  Imaging: No results found.  Past Medical/Family/Surgical/Social History: Medications & Allergies reviewed per EMR, new medications updated. Patient Active Problem List   Diagnosis Date Noted  . Concussion 10/27/2018  . Postmenopausal bleeding 09/01/2018  . MGUS (monoclonal gammopathy of unknown significance) 02/13/2018  . COPD with emphysema ([place]) 10/04/2017  . Palpitations 07/30/2017  . Shortness of breath 07/30/2017  . Hormone replacement therapy (HRT) 03/30/2017  . Former smoker 03/30/2017  . Other fatigue 06/30/2016  . Insomnia 06/30/2016  . DJD (degenerative joint disease), cervical 06/30/2016  . 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 IBS (irritable bowel syndrome) 06/30/2016  . Interstitial cystitis 06/30/2016  . SHOULDER PAIN, RIGHT 09/05/2010  . GERD 03/24/2009  . SACROILIAC STRAIN 03/24/2009  . Atypical chest pain 01/20/2008  . HEMATURIA UNSPECIFIED 10/14/2007  . FIBROMYALGIA 07/03/2007  . HYPERLIPIDEMIA NEC/NOS 05/16/2007  . HEADACHE 05/09/2007   Past Medical History:  Diagnosis Date  . Cervical disc disease 2006  . COPD (chronic obstructive pulmonary disease) ([place])   . Emphysema lung ([place])   . 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    . Fibromyalgia    sees Dr. Estanislado Pandy   . Heart murmur   . 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 Hyperlipidemia   .  Migraines   . MVP (mitral valve prolapse)    no daily medication  . Peptic ulcer    no problems   Family History  Problem Relation Age of Onset  . Breast cancer Mother        lumpectomy in late 50/60  . Heart attack Father   . Congestive Heart Failure Father   . Cancer Other        breast/fhx  . Heart disease Other        fhx  . Diabetes type II Sister        and ? brother  . Arthritis Son        psoriatic  . Colon cancer Neg Hx   . Esophageal cancer Neg Hx   . Rectal cancer Neg Hx   . Stomach cancer Neg Hx    Past Surgical History:  Procedure Laterality Date  . CERVICAL LAMINECTOMY  2006  . CESAREAN SECTION  1993  . CHOLECYSTECTOMY  06/2002  . COLONOSCOPY  06/06/2012   per Dr. Olevia Perches, benign polyp, repeat in 10 yrs   . CYSTOSCOPY N/A 09/01/2018   Procedure: CYSTOSCOPY;  Surgeon: Megan Salon, MD;  [facility];  Service: Gynecology;  Laterality: N/A;  . DILATATION &  CURETTAGE/HYSTEROSCOPY WITH MYOSURE N/A 04/12/2017   Procedure: DILATATION & CURETTAGE/HYSTEROSCOPY;  Surgeon: Megan Salon, MD;  Location: [facility];  Service: Gynecology;  Laterality: N/A;  . HYSTEROSCOPY     years ago with Dr Radene Knee-? 2000  . TOTAL LAPAROSCOPIC HYSTERECTOMY WITH SALPINGECTOMY Bilateral 09/01/2018   Procedure: TOTAL LAPAROSCOPIC HYSTERECTOMY WITH SALPINGECTOMY  BSO, REPAIR OF PERINEAL LACERATION;  Surgeon: Megan Salon, MD;  Location: [facility];  Service: Gynecology;  Laterality: Bilateral;  possible BSO  . URETHRAL DILATION     Social History   Occupational History  . Not on file  Tobacco Use  . Smoking status: Former Smoker    Packs/day: 1.00    Years: 20.00    Pack years: 20.00    Types: Cigarettes    Quit date: 04/02/2013    Years since quitting: 7.2  . Smokeless tobacco: Never Used  Vaping Use  . Vaping Use: Never used  Substance and Sexual Activity  . Alcohol use: Yes    Alcohol/week: 1.0 standard drink     Types: 1 Glasses of wine per week    Comment: occ  . Drug use: Never  . Sexual activity: Never    Partners: Male

## 2020-07-20 ENCOUNTER — Ambulatory Visit: Payer: 59

## 2020-08-11 ENCOUNTER — Other Ambulatory Visit: Payer: Self-pay | Admitting: Family Medicine

## 2020-08-11 DIAGNOSIS — R0789 Other chest pain: Secondary | ICD-10-CM

## 2020-08-17 ENCOUNTER — Other Ambulatory Visit: Payer: Self-pay | Admitting: Family Medicine

## 2020-08-22 ENCOUNTER — Other Ambulatory Visit: Payer: Self-pay | Admitting: Family Medicine

## 2020-08-25 ENCOUNTER — Other Ambulatory Visit: Payer: Self-pay | Admitting: Rheumatology

## 2020-08-31 NOTE — Progress Notes (Addendum)
Office Visit Note  Patient: Emily Ward             Date of Birth: 1956/05/13           MRN: [id number]             PCP: Laurey Morale, MD Referring: Laurey Morale, MD Visit Date: 09/13/2020 Occupation: '@[email or web address]' @  Subjective:  Left knee joint pain   History of Present Illness: Emily Ward is a 64 y.o. female with history of fibromyalgia, DDD, and osteoporosis.  She has noticed significant improvement in her neck pain and radiculopathy after having a facet joint injection performed by Dr. Ernestina Patches on 9/30//21.  She continues to have trapezius muscle tension and tenderness bilaterally.  Patient reports she was recently using her exercise bike and when getting off the bike may have twisted her knee.  She states she initially was experiencing a buckling sensation and painful extension of the left knee.  She denies any joint swelling or warmth. She was evaluated at [facility] on 09/01/20 and had updated x-rays and had an intraarticular cortisone injection performed. She was also given a brace which she only wore for a few days but it caused irritation so she discontinued use.  The buckling sensation has resolved and her ROM has improved.  She has been experiencing any intermittent aching sensation in the left knee but denies any inflammation or mechanical symptoms.  She denies any recent falls or fractures.  She is due to update DEXA. She recieves prolia injections every 6 months at Dr. Almetta Lovely office. She continues to take vitamin D and calcium.  Patient reports that since she was diagnosed with covid-19 last year she has had an increased frequency of migraines.  She states she has noticed some improve since her neck pain has improved.  She also has started using a bite guard at night to help with grinding her teeth and ongoing discomfort due to TMJ syndrome.  She has noticed a decrease in frequency of migraines since starting to use the bite guard.      Activities of  Daily Living:  Patient reports joint stiffness all day  Patient Denies nocturnal pain.  Difficulty dressing/grooming: Denies Difficulty climbing stairs: Denies Difficulty getting out of chair: Denies Difficulty using hands for taps, buttons, cutlery, and/or writing: Reports  Review of Systems  Constitutional: Positive for fatigue.  HENT: Positive for mouth dryness. Negative for mouth sores and nose dryness.   Eyes: Negative for pain, visual disturbance and dryness.  Respiratory: Negative for cough, hemoptysis and difficulty breathing.   Cardiovascular: Negative for chest pain, palpitations, hypertension and swelling in legs/feet.  Gastrointestinal: Negative for blood in stool, constipation and diarrhea.  Endocrine: Negative for increased urination.  Genitourinary: Negative for painful urination.  Musculoskeletal: Positive for arthralgias, joint pain, myalgias, muscle weakness, morning stiffness, muscle tenderness and myalgias. Negative for joint swelling.  Skin: Negative for color change, pallor, rash, hair loss, nodules/bumps, skin tightness, ulcers and sensitivity to sunlight.  Allergic/Immunologic: Negative for susceptible to infections.  Neurological: Positive for weakness. Negative for dizziness, numbness and headaches.  Hematological: Negative for bruising/bleeding tendency and swollen glands.  Psychiatric/Behavioral: Positive for sleep disturbance. Negative for depressed mood. The patient is not nervous/anxious.     PMFS History:  Patient Active Problem List   Diagnosis Date Noted  . Concussion 10/27/2018  . Postmenopausal bleeding 09/01/2018  . MGUS (monoclonal gammopathy of unknown significance) 02/13/2018  . COPD with emphysema ([place]) 10/04/2017  . Palpitations  07/30/2017  . Shortness of breath 07/30/2017  . Hormone replacement therapy (HRT) 03/30/2017  . Former smoker 03/30/2017  . Other fatigue 06/30/2016  . Insomnia 06/30/2016  . DJD (degenerative joint disease),  cervical 06/30/2016  . 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 IBS (irritable bowel syndrome) 06/30/2016  . Interstitial cystitis 06/30/2016  . SHOULDER PAIN, RIGHT 09/05/2010  . GERD 03/24/2009  . SACROILIAC STRAIN 03/24/2009  . Atypical chest pain 01/20/2008  . HEMATURIA UNSPECIFIED 10/14/2007  . FIBROMYALGIA 07/03/2007  . HYPERLIPIDEMIA NEC/NOS 05/16/2007  . HEADACHE 05/09/2007    Past Medical History:  Diagnosis Date  . Cervical disc disease 2006  . COPD (chronic obstructive pulmonary disease) ([place])   . Emphysema lung ([place])   . 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    . Fibromyalgia    sees Dr. Estanislado Pandy   . Heart murmur   . 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 Hyperlipidemia   . Migraines   . MVP (mitral valve prolapse)    no daily medication  . Peptic ulcer    no problems    Family History  Problem Relation Age of Onset  . Breast cancer Mother        lumpectomy in late 50/60  . Heart attack Father   . Congestive Heart Failure Father   . Cancer Other        breast/fhx  . Heart disease Other        fhx  . Diabetes type II Sister        and ? brother  . Arthritis Son        psoriatic  . Colon cancer Neg Hx   . Esophageal cancer Neg Hx   . Rectal cancer Neg Hx   . Stomach cancer Neg Hx    Past Surgical History:  Procedure Laterality Date  . CERVICAL LAMINECTOMY  2006  . CESAREAN SECTION  1993  . CHOLECYSTECTOMY  06/2002  . COLONOSCOPY  06/06/2012   per Dr. Olevia Perches, benign polyp, repeat in 10 yrs   . CYSTOSCOPY N/A 09/01/2018   Procedure: CYSTOSCOPY;  Surgeon: Megan Salon, MD;  Location: [facility];  Service: Gynecology;  Laterality: N/A;  . DILATATION & CURETTAGE/HYSTEROSCOPY WITH MYOSURE N/A 04/12/2017   Procedure: DILATATION & CURETTAGE/HYSTEROSCOPY;  Surgeon: Megan Salon, MD;  Location: [facility];  Service: Gynecology;   Laterality: N/A;  . HYSTEROSCOPY     years ago with Dr Radene Knee-? 2000  . TOTAL LAPAROSCOPIC HYSTERECTOMY WITH SALPINGECTOMY Bilateral 09/01/2018   Procedure: TOTAL LAPAROSCOPIC HYSTERECTOMY WITH SALPINGECTOMY  BSO, REPAIR OF PERINEAL LACERATION;  Surgeon: Megan Salon, MD;  Location: [facility];  Service: Gynecology;  Laterality: Bilateral;  possible BSO  . URETHRAL DILATION     Social History   Social History Narrative  . Not on file   Immunization History  Administered Date(s) Administered  . Influenza Whole 06/30/2007, 05/04/2010  . Influenza,inj,Quad PF,6+ Mos 07/12/2017, 05/23/2018  . Influenza-Unspecified 06/02/2014, 06/03/2016, 06/20/2020  . MMR 06/30/2007  . Moderna Sars-Covid-2 Vaccination 09/07/2019, 10/12/2019, 07/13/2020  . Pneumococcal Polysaccharide-23 03/23/2019  . Td 09/03/1998, 05/05/2009  . Tdap 10/22/2018     Objective: Vital Signs: BP 107/73 (BP Location: Left Arm, Patient Position: Sitting, Cuff Size: Normal)   Pulse 84   Resp 14   Ht '5\' 5"'  (1.651 m)   Wt 115 lb (52.2 kg)   LMP  09/03/2000 (Approximate)   BMI 19.14 kg/m    Physical Exam Vitals and nursing note reviewed.  Constitutional:      Appearance: She is well-developed and well-nourished.  HENT:     Head: Normocephalic and atraumatic.  Eyes:     Extraocular Movements: EOM normal.     Conjunctiva/sclera: Conjunctivae normal.  Cardiovascular:     Pulses: Intact distal pulses.  Pulmonary:     Effort: Pulmonary effort is normal.  Abdominal:     Palpations: Abdomen is soft.  Musculoskeletal:     Cervical back: Normal range of motion.  Skin:    General: Skin is warm and dry.     Capillary Refill: Capillary refill takes less than 2 seconds.  Neurological:     Mental Status: She is alert and oriented to person, place, and time.  Psychiatric:        Mood and Affect: Mood and affect normal.        Behavior: Behavior normal.      Musculoskeletal Exam: C-spine, thoracic  spine, and lumbar spine good ROM.  Trapezius muscle tension and tenderness bilaterally.  Shoulder joints, elbow joints, wrist joints, MCPs, PIPs, and DIPs good ROM with no synovitis.  Complete fist formation bilaterally.  Hip joints good ROM with no discomfort.  No tenderness over trochanteric bursa on exam.  Knee joints good ROM with no warmth or effusion.  Ankle joints good ROM with no tenderness or inflammation.   CDAI Exam: CDAI Score: - Patient Global: -; Provider Global: - Swollen: -; Tender: - Joint Exam 09/13/2020   No joint exam has been documented for this visit   There is currently no information documented on the homunculus. Go to the Rheumatology activity and complete the homunculus joint exam.  Investigation: No additional findings.  Imaging: No results found.  Recent Labs: Lab Results  Component Value Date   WBC 4.3 09/06/2020   HGB 13.9 09/06/2020   PLT 177.0 09/06/2020   NA 138 09/06/2020   K 5.2 (H) 09/06/2020   CL 103 09/06/2020   CO2 31 09/06/2020   GLUCOSE 87 09/06/2020   BUN 21 09/06/2020   CREATININE 0.63 09/06/2020   BILITOT 0.3 09/06/2020   ALKPHOS 61 09/06/2020   AST 18 09/06/2020   ALT 7 09/06/2020   PROT 6.9 09/06/2020   ALBUMIN 4.5 09/06/2020   CALCIUM 9.5 09/06/2020   GFRAA >60 10/22/2018    Speciality Comments: No specialty comments available.  Procedures:  No procedures performed Allergies: Divalproex sodium and Nsaids   Assessment / Plan:     Visit Diagnoses: Fibromyalgia - She not had any recent fibromyalgia flares.  However her symptoms have been stable on the current treatment regimen.  She continues to take Cymbalta 30 mg 2 capsules by mouth daily.  She has chronic trapezius muscle tension and muscle tenderness bilaterally and experiences muscle spasms intermittently.  She tries to perform range of motion exercises on a regular basis.  She also takes Flexeril 10 mg 3 times daily as needed for muscle spasms.  She has chronic fatigue  secondary to insomnia.  Her level of fatigue has been stable recently.  She also takes Ambien 10 mg 1 to half tablet by mouth at bedtime for insomnia.  Discussed the importance of regular exercising to see patient.  She is encouraged to continue to use her stationary bike.  She will follow-up in the office in 6 months.  Other fatigue: Chronic and secondary to insomnia. She exercises on a regular basis.  Primary insomnia -She takes Ambien 10 mg 1/2 tablet by mouth at bedtime for insomnia.    TMJ (temporomandibular joint syndrome): She has noticed improvement in her symptoms since starting to use a bite guard as recommended by her dentist.  She has had slightly less frequent migraines since using the bite guard at night.   Trapezius muscle spasm: She has trapezius muscle tension and tenderness bilaterally.  She experiences intermittent muscle spasms.  She takes flexeril 10 mg 1 tablet by mouth TID PRN for muscle spasms. Discussed the use of a heating pad and massage.   Spondylosis without myelopathy or radiculopathy, cervical region -MRI performed on 05/03/20: Impression: Adjacent segment degeneration at C3-C4 and C6-C7 with left foraminal impingement at both levels, greater at C6-7. C7-T1 osteoarthritis with asymmetric left spurring and mild anterolisthesis. C5-6 ACDF with solid arthrodesis.  She was referred to Dr. Ernestina Patches and underwent a facet joint injection on 06/02/20.  Her neck pain, stiffness, and symptoms of radiculopathy resolved after the injection.  She has ongoing trapezius muscle tension and tenderness and was encouraged to perform stretching exercises, use heat, and massage.  She will be following up with Dr. Ernestina Patches on an as needed basis.   DDD (degenerative disc disease), lumbar - She experiences intermittent discomfort in her lower back.  She takes Flexeril 10 mg 1 tablet 3 times daily as needed for muscle spasms.   Age-related osteoporosis without current pathological fracture - DEXA on  12/13/17: The BMD measured at [facility] Left is 0.516 g/cm2 with a T-score of -3.9. She is overdue to update DEXA.  She contacted Dr. Almetta Lovely office after leaving her appointment today and sent a mychart message that her DEXA is scheduled for 10/25/20. She continues to receive prolia 60 mg sq injections every 6 months at Dr. Almetta Lovely office (endocrionologist).  She is taking vitamin D 2,000 units daily and a calcium supplement as recommended. She has not had any recent falls or fractures.  Discussed the importance of resistive exercises.  She has been using a stationary bike for exercise.   Chronic pain of left knee: She recently injured her left knee when getting off of her stationary bike in December 2021.  Initially she was having difficulty with full extension as well as a buckling sensation with ambulation.  She was evaluated at [facility] on 09/01/2020 and had updated x-rays at that time.  According to the patient she was told she did not have any osteoarthritic changes.  The left knee joint was injected with cortisone and she was given a knee brace to wear.  She tried using the brace for several days but discontinued due to increased irritation.  The buckling sensation has resolved.  She has not had any mechanical symptoms recently.  She is able to fully extend her left knee but has been experiencing occasional discomfort.  According to the patient in the past she was referred to physical therapy for chondromalacia patella which improved her symptoms significantly.  We discussed the importance of lower extremity muscle strengthening.  She declined referral to physical therapy at this time.  She was given a handout of knee joint exercises to perform.  Other medical conditions are listed as follows:   History of gastroesophageal reflux (GERD)  History of IBS  Petechiae  Interstitial cystitis  Orders: No orders of the defined types were placed in this encounter.  No orders of the defined  types were placed in this encounter.    Follow-Up Instructions: Return in about  6 months (around 03/13/2021) for Fibromyalgia, DDD, Osteoporosis.   Ofilia Neas, PA-C  Note - This record has been created using Dragon software.  Chart creation errors have been sought, but may not always  have been located. Such creation errors do not reflect on  the standard of medical care.

## 2020-09-06 ENCOUNTER — Ambulatory Visit (INDEPENDENT_AMBULATORY_CARE_PROVIDER_SITE_OTHER): Payer: 59 | Admitting: Family Medicine

## 2020-09-06 ENCOUNTER — Encounter: Payer: Self-pay | Admitting: Family Medicine

## 2020-09-06 ENCOUNTER — Other Ambulatory Visit: Payer: Self-pay

## 2020-09-06 VITALS — BP 108/80 | HR 76 | Temp 97.9°F | Ht 64.75 in | Wt 113.6 lb

## 2020-09-06 DIAGNOSIS — Z0000 Encounter for general adult medical examination without abnormal findings: Secondary | ICD-10-CM

## 2020-09-06 LAB — HEPATIC FUNCTION PANEL
ALT: 7 U/L (ref 0–35)
AST: 18 U/L (ref 0–37)
Albumin: 4.5 g/dL (ref 3.5–5.2)
Alkaline Phosphatase: 61 U/L (ref 39–117)
Bilirubin, Direct: 0.1 mg/dL (ref 0.0–0.3)
Total Bilirubin: 0.3 mg/dL (ref 0.2–1.2)
Total Protein: 6.9 g/dL (ref 6.0–8.3)

## 2020-09-06 LAB — BASIC METABOLIC PANEL
BUN: 21 mg/dL (ref 6–23)
CO2: 31 mEq/L (ref 19–32)
Calcium: 9.5 mg/dL (ref 8.4–10.5)
Chloride: 103 mEq/L (ref 96–112)
Creatinine, Ser: 0.63 mg/dL (ref 0.40–1.20)
GFR: 95.33 mL/min (ref >60.00)
Glucose, Bld: 87 mg/dL (ref 70–99)
Potassium: 5.2 mEq/L — ABNORMAL HIGH (ref 3.5–5.1)
Sodium: 138 mEq/L (ref 135–145)

## 2020-09-06 LAB — LIPID PANEL
Cholesterol: 238 mg/dL — ABNORMAL HIGH (ref 0–200)
HDL: 83.5 mg/dL (ref >39.00)
LDL Cholesterol: 139 mg/dL — ABNORMAL HIGH (ref 0–99)
NonHDL: 154.63
Total CHOL/HDL Ratio: 3
Triglycerides: 77 mg/dL (ref 0.0–149.0)
VLDL: 15.4 mg/dL (ref 0.0–40.0)

## 2020-09-06 LAB — CBC WITH DIFFERENTIAL/PLATELET
Basophils Absolute: 0 10*3/uL (ref 0.0–0.1)
Basophils Relative: 0.8 % (ref 0.0–3.0)
Eosinophils Absolute: 0.2 10*3/uL (ref 0.0–0.7)
Eosinophils Relative: 4.3 % (ref 0.0–5.0)
HCT: 42.3 % (ref 36.0–46.0)
Hemoglobin: 13.9 g/dL (ref 12.0–15.0)
Lymphocytes Relative: 27.1 % (ref 12.0–46.0)
Lymphs Abs: 1.2 10*3/uL (ref 0.7–4.0)
MCHC: 32.9 g/dL (ref 30.0–36.0)
MCV: 96.9 fl (ref 78.0–100.0)
Monocytes Absolute: 0.3 10*3/uL (ref 0.1–1.0)
Monocytes Relative: 6.9 % (ref 3.0–12.0)
Neutro Abs: 2.6 10*3/uL (ref 1.4–7.7)
Neutrophils Relative %: 60.9 % (ref 43.0–77.0)
Platelets: 177 10*3/uL (ref 150.0–400.0)
RBC: 4.37 Mil/uL (ref 3.87–5.11)
RDW: 13.5 % (ref 11.5–15.5)
WBC: 4.3 10*3/uL (ref 4.0–10.5)

## 2020-09-06 LAB — TSH: TSH: 1.65 u[IU]/mL (ref 0.35–4.50)

## 2020-09-06 NOTE — Progress Notes (Signed)
Subjective:    Patient ID: Emily Ward, female    DOB: [birth year], 65 y.o.   MRN: [id number]  HPI Here for a well exam. She feels fine but she does mention her migraines. This past fall and early winter the migraines were becoming much more frequent, averaging about 2 a week. The Zomig helped but she had to take it more often. Now she says she has not had one in the past 2 weeks.    Review of Systems  Constitutional: Negative.   HENT: Negative.   Eyes: Negative.   Respiratory: Negative.   Cardiovascular: Negative.   Gastrointestinal: Negative.   Genitourinary: Negative for decreased urine volume, difficulty urinating, dyspareunia, dysuria, enuresis, flank pain, frequency, hematuria, pelvic pain and urgency.  Musculoskeletal: Negative.   Skin: Negative.   Neurological: Positive for headaches.  Psychiatric/Behavioral: Negative.        Objective:   Physical Exam Constitutional:      General: She is not in acute distress.    Appearance: She is well-developed and well-nourished.  HENT:     Head: Normocephalic and atraumatic.     Right Ear: External ear normal.     Left Ear: External ear normal.     Nose: Nose normal.     Mouth/Throat:     Mouth: Oropharynx is clear and moist.     Pharynx: No oropharyngeal exudate.  Eyes:     General: No scleral icterus.    Extraocular Movements: EOM normal.     Conjunctiva/sclera: Conjunctivae normal.     Pupils: Pupils are equal, round, and reactive to light.  Neck:     Thyroid: No thyromegaly.     Vascular: No JVD.  Cardiovascular:     Rate and Rhythm: Normal rate and regular rhythm.     Pulses: Intact distal pulses.     Heart sounds: Normal heart sounds. No murmur heard. No friction rub. No gallop.   Pulmonary:     Effort: Pulmonary effort is normal. No respiratory distress.     Breath sounds: Normal breath sounds. No wheezing or rales.  Chest:     Chest wall: No tenderness.  Abdominal:     General: Bowel sounds are  normal. There is no distension.     Palpations: Abdomen is soft. There is no mass.     Tenderness: There is no abdominal tenderness. There is no guarding or rebound.  Musculoskeletal:        General: No tenderness or edema. Normal range of motion.     Cervical back: Normal range of motion and neck supple.  Lymphadenopathy:     Cervical: No cervical adenopathy.  Skin:    General: Skin is warm and dry.     Findings: No erythema or rash.  Neurological:     Mental Status: She is alert and oriented to person, place, and time.     Cranial Nerves: No cranial nerve deficit.     Motor: No abnormal muscle tone.     Coordination: Coordination normal.     Deep Tendon Reflexes: Reflexes are normal and symmetric. Reflexes normal.  Psychiatric:        Mood and Affect: Mood and affect normal.        Behavior: Behavior normal.        Thought Content: Thought content normal.        Judgment: Judgment normal.           Assessment & Plan:  Well exam. We discussed diet and exercise.  Get fasting labs. She will monitor the migraines and let us know if they become frequent. If so we may try a preventative agent like Topamax.  Alysia Penna, MD

## 2020-09-06 NOTE — Addendum Note (Signed)
Addended by: Lerry Liner on: 09/06/2020 01:58 PM   Modules accepted: Orders

## 2020-09-06 NOTE — Addendum Note (Signed)
Addended by: Lerry Liner on: 09/06/2020 01:57 PM   Modules accepted: Orders

## 2020-09-13 ENCOUNTER — Other Ambulatory Visit: Payer: Self-pay

## 2020-09-13 ENCOUNTER — Ambulatory Visit: Payer: 59 | Admitting: Physician Assistant

## 2020-09-13 ENCOUNTER — Encounter: Payer: Self-pay | Admitting: Physician Assistant

## 2020-09-13 VITALS — BP 107/73 | HR 84 | Resp 14 | Ht 65.0 in | Wt 115.0 lb

## 2020-09-13 DIAGNOSIS — M26609 Unspecified temporomandibular joint disorder, unspecified side: Secondary | ICD-10-CM | POA: Diagnosis not present

## 2020-09-13 DIAGNOSIS — R5383 Other fatigue: Secondary | ICD-10-CM | POA: Diagnosis not present

## 2020-09-13 DIAGNOSIS — N3010 Interstitial cystitis (chronic) without hematuria: Secondary | ICD-10-CM

## 2020-09-13 DIAGNOSIS — M47812 Spondylosis without myelopathy or radiculopathy, cervical region: Secondary | ICD-10-CM

## 2020-09-13 DIAGNOSIS — M810 Age-related osteoporosis without current pathological fracture: Secondary | ICD-10-CM

## 2020-09-13 DIAGNOSIS — M797 Fibromyalgia: Secondary | ICD-10-CM | POA: Diagnosis not present

## 2020-09-13 DIAGNOSIS — F5101 Primary insomnia: Secondary | ICD-10-CM

## 2020-09-13 DIAGNOSIS — G8929 Other chronic pain: Secondary | ICD-10-CM

## 2020-09-13 DIAGNOSIS — R233 Spontaneous ecchymoses: Secondary | ICD-10-CM

## 2020-09-13 DIAGNOSIS — M51369 Other intervertebral disc degeneration, lumbar region without mention of lumbar back pain or lower extremity pain: Secondary | ICD-10-CM

## 2020-09-13 DIAGNOSIS — M5136 Other intervertebral disc degeneration, lumbar region: Secondary | ICD-10-CM

## 2020-09-13 DIAGNOSIS — M62838 Other muscle spasm: Secondary | ICD-10-CM

## 2020-09-13 DIAGNOSIS — M25562 Pain in left knee: Secondary | ICD-10-CM

## 2020-09-13 DIAGNOSIS — Z8719 Personal history of other diseases of the digestive system: Secondary | ICD-10-CM

## 2020-09-13 NOTE — Patient Instructions (Signed)
Knee Exercises Ask your health care provider which exercises are safe for you. Do exercises exactly as told by your health care provider and adjust them as directed. It is normal to feel mild stretching, pulling, tightness, or discomfort as you do these exercises. Stop right away if you feel sudden pain or your pain gets worse. Do not begin these exercises until told by your health care provider. Stretching and range-of-motion exercises These exercises warm up your muscles and joints and improve the movement and flexibility of your knee. These exercises also help to relieve pain and swelling. Knee extension, prone 1. Lie on your abdomen (prone position) on a bed. 2. Place your left / right knee just beyond the edge of the surface so your knee is not on the bed. You can put a towel under your left / right thigh just above your kneecap for comfort. 3. Relax your leg muscles and allow gravity to straighten your knee (extension). You should feel a stretch behind your left / right knee. 4. Hold this position for __________ seconds. 5. Scoot up so your knee is supported between repetitions. Repeat __________ times. Complete this exercise __________ times a day. Knee flexion, active 1. Lie on your back with both legs straight. If this causes back discomfort, bend your left / right knee so your foot is flat on the floor. 2. Slowly slide your left / right heel back toward your buttocks. Stop when you feel a gentle stretch in the front of your knee or thigh (flexion). 3. Hold this position for __________ seconds. 4. Slowly slide your left / right heel back to the starting position. Repeat __________ times. Complete this exercise __________ times a day.   Quadriceps stretch, prone 1. Lie on your abdomen on a firm surface, such as a bed or padded floor. 2. Bend your left / right knee and hold your ankle. If you cannot reach your ankle or pant leg, loop a belt around your foot and grab the belt  instead. 3. Gently pull your heel toward your buttocks. Your knee should not slide out to the side. You should feel a stretch in the front of your thigh and knee (quadriceps). 4. Hold this position for __________ seconds. Repeat __________ times. Complete this exercise __________ times a day.   Hamstring, supine 1. Lie on your back (supine position). 2. Loop a belt or towel over the ball of your left / right foot. The ball of your foot is on the walking surface, right under your toes. 3. Straighten your left / right knee and slowly pull on the belt to raise your leg until you feel a gentle stretch behind your knee (hamstring). ? Do not let your knee bend while you do this. ? Keep your other leg flat on the floor. 4. Hold this position for __________ seconds. Repeat __________ times. Complete this exercise __________ times a day. Strengthening exercises These exercises build strength and endurance in your knee. Endurance is the ability to use your muscles for a long time, even after they get tired. Quadriceps, isometric This exercise stretches the muscles in front of your thigh (quadriceps) without moving your knee joint (isometric). 1. Lie on your back with your left / right leg extended and your other knee bent. Put a rolled towel or small pillow under your knee if told by your health care provider. 2. Slowly tense the muscles in the front of your left / right thigh. You should see your kneecap slide up toward your   hip or see increased dimpling just above the knee. This motion will push the back of the knee toward the floor. 3. For __________ seconds, hold the muscle as tight as you can without increasing your pain. 4. Relax the muscles slowly and completely. Repeat __________ times. Complete this exercise __________ times a day.   Straight leg raises This exercise stretches the muscles in front of your thigh (quadriceps) and the muscles that move your hips (hip flexors). 1. Lie on your back  with your left / right leg extended and your other knee bent. 2. Tense the muscles in the front of your left / right thigh. You should see your kneecap slide up or see increased dimpling just above the knee. Your thigh may even shake a bit. 3. Keep these muscles tight as you raise your leg 4-6 inches (10-15 cm) off the floor. Do not let your knee bend. 4. Hold this position for __________ seconds. 5. Keep these muscles tense as you lower your leg. 6. Relax your muscles slowly and completely after each repetition. Repeat __________ times. Complete this exercise __________ times a day. Hamstring, isometric 1. Lie on your back on a firm surface. 2. Bend your left / right knee about __________ degrees. 3. Dig your left / right heel into the surface as if you are trying to pull it toward your buttocks. Tighten the muscles in the back of your thighs (hamstring) to "dig" as hard as you can without increasing any pain. 4. Hold this position for __________ seconds. 5. Release the tension gradually and allow your muscles to relax completely for __________ seconds after each repetition. Repeat __________ times. Complete this exercise __________ times a day. Hamstring curls If told by your health care provider, do this exercise while wearing ankle weights. Begin with __________ lb weights. Then increase the weight by 1 lb (0.5 kg) increments. Do not wear ankle weights that are more than __________ lb. 1. Lie on your abdomen with your legs straight. 2. Bend your left / right knee as far as you can without feeling pain. Keep your hips flat against the floor. 3. Hold this position for __________ seconds. 4. Slowly lower your leg to the starting position. Repeat __________ times. Complete this exercise __________ times a day.   Squats This exercise strengthens the muscles in front of your thigh and knee (quadriceps). 1. Stand in front of a table, with your feet and knees pointing straight ahead. You may rest  your hands on the table for balance but not for support. 2. Slowly bend your knees and lower your hips like you are going to sit in a chair. ? Keep your weight over your heels, not over your toes. ? Keep your lower legs upright so they are parallel with the table legs. ? Do not let your hips go lower than your knees. ? Do not bend lower than told by your health care provider. ? If your knee pain increases, do not bend as low. 3. Hold the squat position for __________ seconds. 4. Slowly push with your legs to return to standing. Do not use your hands to pull yourself to standing. Repeat __________ times. Complete this exercise __________ times a day. Wall slides This exercise strengthens the muscles in front of your thigh and knee (quadriceps). 1. Lean your back against a smooth wall or door, and walk your feet out 18-24 inches (46-61 cm) from it. 2. Place your feet hip-width apart. 3. Slowly slide down the wall or door   until your knees bend __________ degrees. Keep your knees over your heels, not over your toes. Keep your knees in line with your hips. 4. Hold this position for __________ seconds. Repeat __________ times. Complete this exercise __________ times a day.   Straight leg raises This exercise strengthens the muscles that rotate the leg at the hip and move it away from your body (hip abductors). 1. Lie on your side with your left / right leg in the top position. Lie so your head, shoulder, knee, and hip line up. You may bend your bottom knee to help you keep your balance. 2. Roll your hips slightly forward so your hips are stacked directly over each other and your left / right knee is facing forward. 3. Leading with your heel, lift your top leg 4-6 inches (10-15 cm). You should feel the muscles in your outer hip lifting. ? Do not let your foot drift forward. ? Do not let your knee roll toward the ceiling. 4. Hold this position for __________ seconds. 5. Slowly return your leg to the  starting position. 6. Let your muscles relax completely after each repetition. Repeat __________ times. Complete this exercise __________ times a day.   Straight leg raises This exercise stretches the muscles that move your hips away from the front of the pelvis (hip extensors). 1. Lie on your abdomen on a firm surface. You can put a pillow under your hips if that is more comfortable. 2. Tense the muscles in your buttocks and lift your left / right leg about 4-6 inches (10-15 cm). Keep your knee straight as you lift your leg. 3. Hold this position for __________ seconds. 4. Slowly lower your leg to the starting position. 5. Let your leg relax completely after each repetition. Repeat __________ times. Complete this exercise __________ times a day. This information is not intended to replace advice given to you by your health care provider. Make sure you discuss any questions you have with your health care provider. Document Revised: 06/10/2018 Document Reviewed: 06/10/2018 Elsevier Patient Education  2021 Elsevier Inc.  

## 2020-09-26 ENCOUNTER — Other Ambulatory Visit: Payer: Self-pay | Admitting: Obstetrics & Gynecology

## 2020-09-29 ENCOUNTER — Other Ambulatory Visit (HOSPITAL_BASED_OUTPATIENT_CLINIC_OR_DEPARTMENT_OTHER): Payer: Self-pay | Admitting: *Deleted

## 2020-09-29 MED ORDER — ESTRADIOL 1 MG PO TABS: 1.0000 mg | ORAL_TABLET | Freq: Every day | ORAL | 0 refills | Status: DC

## 2020-09-29 NOTE — Telephone Encounter (Signed)
Pt called for refill of estradiol 1mg . Pt has annual exam scheduled with Dr Sabra Heck in Feb 2022. Pt knows to check with her [facility] pharmacy later in the day to be able to pick it up.  Sharrie Rothman CMA

## 2020-10-18 ENCOUNTER — Ambulatory Visit (INDEPENDENT_AMBULATORY_CARE_PROVIDER_SITE_OTHER): Payer: 59 | Admitting: Obstetrics & Gynecology

## 2020-10-18 ENCOUNTER — Other Ambulatory Visit: Payer: Self-pay

## 2020-10-18 ENCOUNTER — Encounter (HOSPITAL_BASED_OUTPATIENT_CLINIC_OR_DEPARTMENT_OTHER): Payer: Self-pay

## 2020-10-18 ENCOUNTER — Encounter (HOSPITAL_BASED_OUTPATIENT_CLINIC_OR_DEPARTMENT_OTHER): Payer: Self-pay | Admitting: Obstetrics & Gynecology

## 2020-10-18 VITALS — BP 116/76 | HR 77 | Ht 65.0 in | Wt 116.6 lb

## 2020-10-18 DIAGNOSIS — Z803 Family history of malignant neoplasm of breast: Secondary | ICD-10-CM

## 2020-10-18 DIAGNOSIS — Z01419 Encounter for gynecological examination (general) (routine) without abnormal findings: Secondary | ICD-10-CM | POA: Diagnosis not present

## 2020-10-18 DIAGNOSIS — Z1231 Encounter for screening mammogram for malignant neoplasm of breast: Secondary | ICD-10-CM

## 2020-10-18 DIAGNOSIS — Z9229 Personal history of other drug therapy: Secondary | ICD-10-CM

## 2020-10-18 DIAGNOSIS — E213 Hyperparathyroidism, unspecified: Secondary | ICD-10-CM | POA: Insufficient documentation

## 2020-10-18 DIAGNOSIS — M818 Other osteoporosis without current pathological fracture: Secondary | ICD-10-CM | POA: Diagnosis not present

## 2020-10-18 DIAGNOSIS — R32 Unspecified urinary incontinence: Secondary | ICD-10-CM

## 2020-10-18 DIAGNOSIS — D472 Monoclonal gammopathy: Secondary | ICD-10-CM

## 2020-10-18 DIAGNOSIS — J439 Emphysema, unspecified: Secondary | ICD-10-CM

## 2020-10-18 DIAGNOSIS — Z87891 Personal history of nicotine dependence: Secondary | ICD-10-CM

## 2020-10-18 DIAGNOSIS — M810 Age-related osteoporosis without current pathological fracture: Secondary | ICD-10-CM | POA: Insufficient documentation

## 2020-10-18 MED ORDER — ESTRADIOL 1 MG PO TABS: 1.0000 mg | ORAL_TABLET | Freq: Every day | ORAL | 4 refills | Status: DC

## 2020-10-18 NOTE — Progress Notes (Signed)
65 y.o. G2P2 Married White or Caucasian female here for annual exam.  She retired in June.  This has been such a good decision for her.  Husband retired after 64 years at [employer].  Had to have 63 year old oak taken down due to disease.    She is having some urinary leakage issue.  She is wearing a little light day pad.  Does leak with cough or sneeze.    BMD is scheduled for next month.  Patient's last menstrual period was 09/03/2000 (approximate).          Sexually active: Yes.    The current method of family planning is status post hysterectomy.    Exercising: Yes.   Smoker:  no  Health Maintenance: Pap:  11/19 neg with neg HR HPV History of abnormal Pap:  Yes, remote hx MMG:  09/30/2019 Colonoscopy:  06/2012.  Follow up 10 years. BMD:   12/13/2017 TDaP:  10/22/2018 Pneumonia vaccine(s):  Pneumovax 03/23/19 Shingrix:   Discussed with pt today Hep C testing: 09/25/2016 Screening Labs: Dr. Sarajane Jews done in Jan 2022   reports that she quit smoking about 7 years ago. Her smoking use included cigarettes. She has a 20.00 pack-year smoking history. She has never used smokeless tobacco. She reports current alcohol use of about 1.0 standard drink of alcohol per week. She reports that she does not use drugs.  Past Medical History:  Diagnosis Date  . Cervical disc disease 2006  . COPD (chronic obstructive pulmonary disease) ([place])   . Emphysema lung ([place])   . 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    . Fibromyalgia    sees Dr. Estanislado Pandy   . Heart murmur   . 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 Hyperlipidemia   . Migraines   . MVP (mitral valve prolapse)    no daily medication  . Peptic ulcer    no problems    Past Surgical History:  Procedure Laterality Date  . CERVICAL LAMINECTOMY  2006  . CESAREAN SECTION  1993  . CHOLECYSTECTOMY  06/2002  . COLONOSCOPY  06/06/2012   per Dr. Olevia Perches, benign polyp, repeat in 10 yrs   . CYSTOSCOPY N/A  09/01/2018   Procedure: CYSTOSCOPY;  Surgeon: Megan Salon, MD;  Location: [facility];  Service: Gynecology;  Laterality: N/A;  . DILATATION & CURETTAGE/HYSTEROSCOPY WITH MYOSURE N/A 04/12/2017   Procedure: DILATATION & CURETTAGE/HYSTEROSCOPY;  Surgeon: Megan Salon, MD;  Location: [facility];  Service: Gynecology;  Laterality: N/A;  . HYSTEROSCOPY     years ago with Dr Radene Knee-? 2000  . TOTAL LAPAROSCOPIC HYSTERECTOMY WITH SALPINGECTOMY Bilateral 09/01/2018   Procedure: TOTAL LAPAROSCOPIC HYSTERECTOMY WITH SALPINGECTOMY  BSO, REPAIR OF PERINEAL LACERATION;  Surgeon: Megan Salon, MD;  Location: [facility];  Service: Gynecology;  Laterality: Bilateral;  possible BSO  . URETHRAL DILATION      Current Outpatient Medications  Medication Sig Dispense Refill  . albuterol (VENTOLIN HFA) 108 (90 Base) MCG/ACT inhaler Inhale 1-2 puffs into the lungs every 6 (six) hours as needed for wheezing or shortness of breath.    . butalbital-acetaminophen-caffeine (FIORICET) 50-325-40 MG tablet TAKE 1 TABLET BY MOUTH EVERY 6 HOURS AS NEEDED 120 tablet 5  . CALCIUM PO Take 1 tablet by mouth daily.    . cetirizine (ZYRTEC) 10 MG tablet Take 10 mg by mouth daily as needed for allergies (spring time).    Marland Kitchen  chlorzoxazone (PARAFON) 500 MG tablet TAKE 1 TABLET BY MOUTH FOUR TIMES DAILY AS NEEDED FOR MUSCLE SPASMS 120 tablet 5  . cholecalciferol (VITAMIN D) 1000 units tablet Take 2,000 Units by mouth daily.     . Cyanocobalamin (VITAMIN B-12 PO) Take 1 tablet by mouth daily.     . cyclobenzaprine (FLEXERIL) 10 MG tablet TAKE 1 TABLET BY MOUTH THREE TIMES DAILY AS NEEDED FOR MUSCLE SPASMS 270 tablet 1  . denosumab (PROLIA) 60 MG/ML SOSY injection Inject 60 mg into the skin every 6 (six) months.    . DULoxetine (CYMBALTA) 30 MG capsule TAKE 2 CAPSULES BY MOUTH EVERY MORNING 180 capsule 0  . estradiol (ESTRACE) 1 MG tablet Take 1 tablet (1 mg total) by mouth daily. 90  tablet 0  . meloxicam (MOBIC) 7.5 MG tablet TAKE 1 TABLET(7.5 MG) BY MOUTH DAILY 90 tablet 3  . umeclidinium-vilanterol (ANORO ELLIPTA) 62.5-25 MCG/INH AEPB Inhale 1 puff into the lungs daily. 60 each 12  . zolmitriptan (ZOMIG) 5 MG tablet TAKE 1 TABLET BY MOUTH AS NEEDED AS DIRECTED 30 tablet 5  . zolpidem (AMBIEN) 10 MG tablet TAKE 1 TABLET(10 MG) BY MOUTH AT BEDTIME 30 tablet 5   No current facility-administered medications for this visit.    Family History  Problem Relation Age of Onset  . Breast cancer Mother        lumpectomy in late 50/60  . Heart attack Father   . Congestive Heart Failure Father   . Cancer Other        breast/fhx  . Heart disease Other        fhx  . Diabetes type II Sister        and ? brother  . Arthritis Son        psoriatic  . Colon cancer Neg Hx   . Esophageal cancer Neg Hx   . Rectal cancer Neg Hx   . Stomach cancer Neg Hx     Review of Systems  All other systems reviewed and are negative.   Exam:   BP 116/76   Pulse 77   Ht 5\' 5"  (1.651 m)   Wt 116 lb 9.6 oz (52.9 kg)   LMP 09/03/2000 (Approximate)   SpO2 99%   BMI 19.40 kg/m   Height: 5\' 5"  (165.1 cm)  General appearance: alert, cooperative and appears stated age Head: Normocephalic, without obvious abnormality, atraumatic Neck: no adenopathy, supple, symmetrical, trachea midline and thyroid normal to inspection and palpation Lungs: clear to auscultation bilaterally Breasts: normal appearance, no masses or tenderness Heart: regular rate and rhythm Abdomen: soft, non-tender; bowel sounds normal; no masses,  no organomegaly Extremities: extremities normal, atraumatic, no cyanosis or edema Skin: Skin color, texture, turgor normal. No rashes or lesions Lymph nodes: Cervical, supraclavicular, and axillary nodes normal. No abnormal inguinal nodes palpated Neurologic: Grossly normal   Pelvic: External genitalia:  no lesions              Urethra:  normal appearing urethra with no  masses, tenderness or lesions              Bartholins and Skenes: normal                 Vagina: normal appearing vagina with normal color and discharge, no lesions              Cervix: absent              Pap taken: No. Bimanual Exam:  Uterus:  uterus absent              Adnexa: normal adnexa and no mass, fullness, tenderness               Rectovaginal: Confirms               Anus:  normal sphincter tone, no lesions  Chaperone, Britt Bottom, CMA, was present for exam.  Assessment/Plan: 1. Well woman exam with routine gynecological exam - pap not indicated - MMG 09/30/2019.  MMG order placed today.   - colonoscopy due   2. Urinary incontinence in female - Ambulatory referral to Physical Therapy  3. History of postmenopausal HRT - estradiol (ESTRACE) 1 MG tablet; Take 1 tablet (1 mg total) by mouth daily.  Dispense: 90 tablet; Refill: 4  4. Other osteoporosis without current pathological fracture - DG DXA FRACTURE ASSESSMENT; Future  5. Former smoker  6. Pulmonary emphysema, unspecified emphysema type ([place]) - followed by Dr. Vaughan Browner  7. MGUS (monoclonal gammopathy of unknown significance) - followed by Dr. Julien Nordmann  8.  Family history of breast cancer - Tyrer Cusick model calculated today <10%

## 2020-10-20 ENCOUNTER — Encounter (HOSPITAL_BASED_OUTPATIENT_CLINIC_OR_DEPARTMENT_OTHER): Payer: Self-pay

## 2020-10-21 ENCOUNTER — Other Ambulatory Visit: Payer: Self-pay | Admitting: Rheumatology

## 2020-10-21 NOTE — Telephone Encounter (Signed)
Last Visit: 09/13/2020 Next Visit: 03/14/2021  Current Dose per office note on 09/13/2020, Cymbalta 30 mg 2 capsules by mouth daily. Dx:  Fibromyalgia   Last Fill:06/28/2020  Okay to refill Cymbalta?

## 2020-10-22 ENCOUNTER — Ambulatory Visit
Admission: RE | Admit: 2020-10-22 | Discharge: 2020-10-22 | Disposition: A | Discharge status: Home / Self Care | Payer: 59 | Source: Ambulatory Visit | Attending: Obstetrics & Gynecology | Admitting: Obstetrics & Gynecology

## 2020-10-22 ENCOUNTER — Other Ambulatory Visit: Payer: Self-pay

## 2020-10-22 DIAGNOSIS — M818 Other osteoporosis without current pathological fracture: Secondary | ICD-10-CM

## 2020-10-22 DIAGNOSIS — Z1231 Encounter for screening mammogram for malignant neoplasm of breast: Secondary | ICD-10-CM

## 2020-10-28 ENCOUNTER — Encounter (HOSPITAL_BASED_OUTPATIENT_CLINIC_OR_DEPARTMENT_OTHER): Payer: Self-pay

## 2020-11-18 ENCOUNTER — Other Ambulatory Visit: Payer: Self-pay | Admitting: Family Medicine

## 2020-12-07 ENCOUNTER — Ambulatory Visit: Payer: 59 | Attending: Obstetrics & Gynecology | Admitting: Physical Therapy

## 2020-12-07 ENCOUNTER — Encounter: Payer: Self-pay | Admitting: Physical Therapy

## 2020-12-07 ENCOUNTER — Other Ambulatory Visit: Payer: Self-pay

## 2020-12-07 DIAGNOSIS — R32 Unspecified urinary incontinence: Secondary | ICD-10-CM | POA: Insufficient documentation

## 2020-12-07 DIAGNOSIS — R278 Other lack of coordination: Secondary | ICD-10-CM | POA: Diagnosis present

## 2020-12-07 DIAGNOSIS — M6281 Muscle weakness (generalized): Secondary | ICD-10-CM | POA: Diagnosis not present

## 2020-12-07 NOTE — Patient Instructions (Addendum)
Moisturizers . They are used in the vagina to hydrate the mucous membrane that make up the vaginal canal. . Designed to keep a more normal acid balance (ph) . Once placed in the vagina, it will last between two to three days.  . Use 2-3 times per week at bedtime  . Ingredients to avoid is glycerin and fragrance, can increase chance of infection . Should not be used just before sex due to causing irritation . Most are gels administered either in a tampon-shaped applicator or as a vaginal suppository. They are non-hormonal.   Types of Moisturizers(internal use)  . Vitamin E vaginal suppositories- Whole foods, Amazon . Moist Again . Coconut oil- can break down condoms . Julva- (Do no use if on Tamoxifen) amazon . Yes moisturizer- amazon . NeuEve Silk , NeuEve Silver for menopausal or over 65 (if have severe vaginal atrophy or cancer treatments use NeuEve Silk for  1 month than move to NeuEve Silver)- Amazon, Neuve.com . Olive and Bee intimate cream- www.oliveandbee.com.au . Mae vaginal moisturizer- Amazon . Aloe .    Creams to use externally on the Vulva area  Desert Harvest Releveum (good for for cancer patients that had radiation to the area)- amazon or www.desertharvest.com  V-magic cream - amazon  Julva-amazon  Vital "V Wild Yam salve ( help moisturize and help with thinning vulvar area, does have Beeswax  MoodMaid Botanical Pro-Meno Wild Yam Cream- Amazon  Desert Harvest Gele  Cleo by Damiva labial moisturizer (Amazon,   Coconut or olive oil  aloe   Things to avoid in the vaginal area . Do not use things to irritate the vulvar area . No lotions just specialized creams for the vulva area- Neogyn, V-magic, No soaps; can use Aveeno or Calendula cleanser if needed. Must be gentle . No deodorants . No douches . Good to sleep without underwear to let the vaginal area to air out . No scrubbing: spread the lips to let warm water rinse over labias and pat dry  [facility] [address], [address] [place], [place] [address] Phone # [phone] Fax [phone]  

## 2020-12-07 NOTE — Therapy (Signed)
[facility] [address] [place], [place], [address] Phone: [phone]   Fax:  [phone]  Physical Therapy Evaluation  Patient Details  Name: Emily Ward MRN: [id number] Date of Birth: 1956/02/06 Referring Provider (PT): Dr. Hale Bogus   Encounter Date: 12/07/2020   PT End of Session - 12/07/20 1243    Visit Number 1    Date for PT Re-Evaluation 03/01/21    Authorization Type UHC    PT Start Time 1100    PT Stop Time 1140    PT Time Calculation (min) 40 min    Activity Tolerance Patient tolerated treatment well;No increased pain    Behavior During Therapy WFL for tasks assessed/performed           Past Medical History:  Diagnosis Date  . Cervical disc disease 2006  . COPD (chronic obstructive pulmonary disease) ([place])   . Emphysema lung ([place])   . 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    . Fibromyalgia    sees Dr. Estanislado Pandy   . Heart murmur   . 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 Hyperlipidemia   . Migraines   . MVP (mitral valve prolapse)    no daily medication  . Peptic ulcer    no problems    Past Surgical History:  Procedure Laterality Date  . CERVICAL LAMINECTOMY  2006  . CESAREAN SECTION  1993  . CHOLECYSTECTOMY  06/2002  . COLONOSCOPY  06/06/2012   per Dr. Olevia Perches, benign polyp, repeat in 10 yrs   . CYSTOSCOPY N/A 09/01/2018   Procedure: CYSTOSCOPY;  Surgeon: Megan Salon, MD;  [facility];  Service: Gynecology;  Laterality: N/A;  . DILATATION & CURETTAGE/HYSTEROSCOPY WITH MYOSURE N/A 04/12/2017   Procedure: DILATATION & CURETTAGE/HYSTEROSCOPY;  Surgeon: Megan Salon, MD;  Location: [facility];  Service: Gynecology;  Laterality: N/A;  . HYSTEROSCOPY     years ago with Dr Radene Knee-? 2000  . TOTAL LAPAROSCOPIC HYSTERECTOMY WITH SALPINGECTOMY Bilateral 09/01/2018   Procedure: TOTAL  LAPAROSCOPIC HYSTERECTOMY WITH SALPINGECTOMY  BSO, REPAIR OF PERINEAL LACERATION;  Surgeon: Megan Salon, MD;  Location: [facility];  Service: Gynecology;  Laterality: Bilateral;  possible BSO  . URETHRAL DILATION      There were no vitals filed for this visit.    Subjective Assessment - 12/07/20 1106    Subjective Patient has the urge to urinate all of the time. Patient has had her bladder dilated 2x du eot IC. Marland Kitchen Patient has had PT before. Urege to urinate started after hysterectomy on 09/01/3018.    Patient Stated Goals reduce leakage    Currently in Pain? Yes   did not get pain level due to patient talking about it end of eval   Pain Location Vagina    Pain Orientation Mid    Pain Type Chronic pain    Pain Onset More than a month ago    Pain Frequency Intermittent    Aggravating Factors  penile penetration vaginally    Pain Relieving Factors no penile penetration vaginally              [facility] PT Assessment - 12/07/20 0001      Assessment   Medical Diagnosis R32 urinary incontinence in female    Referring Provider (PT) Dr. Hale Bogus    Onset Date/Surgical Date --   09/01/2018   Prior Therapy prior  pelvic floor physical therapy      Precautions   Precautions Other (comment)    Precaution Comments osteoporosis      Restrictions   Weight Bearing Restrictions No      Balance Screen   Has the patient fallen in the past 6 months No    Has the patient had a decrease in activity level because of a fear of falling?  No    Is the patient reluctant to leave their home because of a fear of falling?  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    Leisure walking, would like to get into yoga      Cognition   Overall Cognitive Status Within Functional Limits for tasks assessed      Posture/Postural Control   Posture/Postural Control  No significant limitations      ROM / Strength   AROM / PROM / Strength PROM;AROM;Strength      Strength   Overall Strength Comments shaky when standing on one leg    Right Hip Flexion 4/5    Right Hip Extension 4/5    Right Hip External Rotation  4/5    Right Hip Internal Rotation 4/5    Left Hip Flexion 4/5    Left Hip External Rotation 4/5    Left Hip Internal Rotation 4/5    Left Hip ABduction 4/5    Left Hip ADduction 4/5                      Objective measurements completed on examination: See above findings.     Pelvic Floor Special Questions - 12/07/20 0001    Prior Pregnancies Yes    Number of C-Sections 1    Number of Vaginal Deliveries 1   grade 3   Currently Sexually Active Yes    Is this Painful Yes   dryness   Urinary Leakage Yes    Pad use thin pads 5-8    Activities that cause leaking Coughing;Sneezing;Laughing;Lifting;Bending;Walking   no urinary leakage at night   Urinary urgency Yes   does not leak on the way to the commode   Urinary frequency always has had issues with emptying her bladder; the meatus feels irritated    Fecal incontinence No    Falling out feeling (prolapse) No    Pelvic Floor Internal Exam Patient confirms identification and approves PT to assess and treat    Exam Type Vaginal    Palpation tightness in the perineal body, along the puborectalis and superficial transverse    Strength Flicker                    PT Education - 12/07/20 1243    Education Details education on vaginal moisturizers and diaphragmatic breathing    Person(s) Educated Patient    Methods Explanation;Handout;Demonstration    Comprehension Verbalized understanding;Returned demonstration            PT Short Term Goals - 12/07/20 1249      PT SHORT TERM GOAL #1   Title independent with initial HEP with diaphragmatic breathing and stretches    Time 4    Period Weeks    Target Date 01/04/21      PT SHORT TERM GOAL #2   Title  understand on how to perform her  own perineal massage to lengthen the pelvic floor muscles    Time 4    Period Weeks    Status New    Target Date 01/04/21             PT Long Term Goals - 12/07/20 1250      PT LONG TERM GOAL #1   Title independent with advanced HEP for pelvic floor strength    Time 12    Period Weeks    Target Date 03/01/21      PT LONG TERM GOAL #2   Title urinary leakage decreased so she is wearing </= 1 pad per day    Time 12    Period Weeks    Status New    Target Date 03/01/21      PT LONG TERM GOAL #3   Title pelvic floor strength is >/= 3/5 and able to fully relax so she has increased coordination of the muscles to prevent urinary leakage.    Time 12    Period Weeks    Status New    Target Date 03/01/21      PT LONG TERM GOAL #4   Title able to cough, laugh, lift without leaking urine due to full contraction of the pelvic floor quickly with full relaxation    Time 12    Period Weeks    Status New    Target Date 03/01/21      PT LONG TERM GOAL #5   Title educated on vaginal health to incorporate moisturizers and lubricants to reduce dryness and friction with intercourse    Time 12    Period Weeks    Status New    Target Date 03/01/21                  Plan - 12/07/20 1141    Clinical Impression Statement Patient is a 65 year old female with urinary leakage since her hysterectomy on 09/01/2018. Patient will leak urine with coughing, laughing, lifting, bending, and just leak for no reason. She does not leak as she is walking to the commode. Patient reports she has pain with intercourse. Pelvic floor strength is 1/5 and unable to relax the pelvic floor. Tenderness and tightness on the perineal body, superficial transverse. Patient is able to breath into the lungs but has difficulty with getting the air into the pelvic floor to relax. Patient will benefit from skilled therapy to improve pelvic floor mobility and coordination to reduce  urinary leakage.    Personal Factors and Comorbidities Comorbidity 3+;Fitness;Sex    Comorbidities Fibromyalgia; COPD; C-section 1993; Hysterectomy 09/01/2018; Cervical lamenectomy    Examination-Activity Limitations Continence;Lift;Locomotion Level;Carry;Stand;Toileting    Examination-Participation Restrictions Community Activity;Cleaning;Meal Prep;Interpersonal Relationship    Stability/Clinical Decision Making Stable/Uncomplicated    Clinical Decision Making Low    Rehab Potential Excellent    PT Frequency 1x / week   every other week   PT Duration 12 weeks    PT Treatment/Interventions ADLs/Self Care Home Management;Cryotherapy;Moist Heat;Therapeutic activities;Therapeutic exercise;Neuromuscular re-education;Manual techniques;Patient/family education;Dry needling    PT Next Visit Plan dry needling the perineal body; manual work to the pelvic floor, hip stretches for the pelvic floor, diaphragmatic breathing    Consulted and Agree with Plan of Care Patient           Patient will benefit from skilled therapeutic intervention in order to improve the following deficits and impairments:  Decreased coordination,Increased fascial restricitons,Pain,Decreased endurance,Increased muscle spasms,Decreased activity tolerance,Decreased strength  Visit Diagnosis: Muscle weakness (generalized) -  Plan: PT plan of care cert/re-cert  Other lack of coordination - Plan: PT plan of care cert/re-cert  Urinary incontinence, unspecified type - Plan: PT plan of care cert/re-cert     Problem List Patient Active Problem List   Diagnosis Date Noted  . Hyperparathyroidism ([place]) 10/18/2020  . Osteoporosis 10/18/2020  . Concussion 10/27/2018  . Postmenopausal bleeding 09/01/2018  . MGUS (monoclonal gammopathy of unknown significance) 02/13/2018  . COPD with emphysema ([place]) 10/04/2017  . Palpitations 07/30/2017  . Shortness of breath 07/30/2017  . Hormone replacement therapy (HRT) 03/30/2017  . Former  smoker 03/30/2017  . Other fatigue 06/30/2016  . Insomnia 06/30/2016  . DJD (degenerative joint disease), cervical 06/30/2016  . 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 IBS (irritable bowel syndrome) 06/30/2016  . Interstitial cystitis 06/30/2016  . SHOULDER PAIN, RIGHT 09/05/2010  . GERD 03/24/2009  . SACROILIAC STRAIN 03/24/2009  . Atypical chest pain 01/20/2008  . HEMATURIA UNSPECIFIED 10/14/2007  . FIBROMYALGIA 07/03/2007  . HYPERLIPIDEMIA NEC/NOS 05/16/2007  . HEADACHE 05/09/2007    Earlie Counts, PT 12/07/20 12:57 PM   [facility] [address], [place], [place], [address] Phone: [phone]   Fax:  [phone]  Name: Biviana Saddler MRN: [id number] Date of Birth: 17-May-1956

## 2020-12-09 ENCOUNTER — Ambulatory Visit: Payer: 59

## 2020-12-15 ENCOUNTER — Other Ambulatory Visit: Payer: Self-pay | Admitting: Family Medicine

## 2020-12-19 NOTE — Telephone Encounter (Signed)
Last refill-06/28/2020-120 tabs with 5 refills Last office visit- 09/06/2020  No future refill has been scheduled

## 2020-12-21 ENCOUNTER — Encounter: Payer: Self-pay | Admitting: Physical Therapy

## 2020-12-21 ENCOUNTER — Other Ambulatory Visit: Payer: Self-pay

## 2020-12-21 ENCOUNTER — Ambulatory Visit: Payer: 59 | Admitting: Physical Therapy

## 2020-12-21 DIAGNOSIS — M6281 Muscle weakness (generalized): Secondary | ICD-10-CM | POA: Diagnosis not present

## 2020-12-21 DIAGNOSIS — R32 Unspecified urinary incontinence: Secondary | ICD-10-CM

## 2020-12-21 DIAGNOSIS — R278 Other lack of coordination: Secondary | ICD-10-CM

## 2020-12-21 NOTE — Patient Instructions (Addendum)
Access Code: [email or web address] URL: [email or web address] Date: 12/21/2020 Prepared by: Earlie Counts  Exercises Supine Hamstring Stretch with Strap - 1 x daily - 7 x weekly - 1 sets - 2 reps - 30 sec hold Supine ITB Stretch with Strap - 1 x daily - 7 x weekly - 1 sets - 2 reps - 30 sec hold Hip Adductors and Hamstring Stretch with Strap - 1 x daily - 7 x weekly - 1 sets - 2 reps - 30 sec hold Supine Pelvic Floor Stretch - 1 x daily - 7 x weekly - 1 sets - 2 reps - 1 min hold Pigeon Pose - 1 x daily - 7 x weekly - 1 sets - 2 reps - 30 sec hold [facility] [address] [place], [place] [address] Phone # [phone] Fax [phone]   Trigger Point Dry Needling  . What is Trigger Point Dry Needling (DN)? o DN is a physical therapy technique used to treat muscle pain and dysfunction. Specifically, DN helps deactivate muscle trigger points (muscle knots).  o A thin filiform needle is used to penetrate the skin and stimulate the underlying trigger point. The goal is for a local twitch response (LTR) to occur and for the trigger point to relax. No medication of any kind is injected during the procedure.   . What Does Trigger Point Dry Needling Feel Like?  o The procedure feels different for each individual patient. Some patients report that they do not actually feel the needle enter the skin and overall the process is not painful. Very mild bleeding may occur. However, many patients feel a deep cramping in the muscle in which the needle was inserted. This is the local twitch response.   Marland Kitchen How Will I feel after the treatment? o Soreness is normal, and the onset of soreness may not occur for a few hours. Typically this soreness does not last longer than two days.  o Bruising is uncommon, however; ice can be used to decrease any possible bruising.  o In rare cases feeling tired or nauseous after the treatment is normal. In addition, your symptoms may get worse before  they get better, this period will typically not last longer than 24 hours.   . What Can I do After My Treatment? o Increase your hydration by drinking more water for the next 24 hours. o You may place ice or heat on the areas treated that have become sore, however, do not use heat on inflamed or bruised areas. Heat often brings more relief post needling. o You can continue your regular activities, but vigorous activity is not recommended initially after the treatment for 24 hours. o DN is best combined with other physical therapy such as strengthening, stretching, and other therapies.    [facility] [address], [place], [place] [address] Phone # [phone] Fax [phone]

## 2020-12-21 NOTE — Therapy (Signed)
[facility] [address], [place], [place], [address] Phone: [phone]   Fax:  [phone]  Physical Therapy Treatment  Patient Details  Name: Emily Ward MRN: [id number] Date of Birth: [birth year] Referring Provider (PT): Dr. Hale Bogus   Encounter Date: 12/21/2020   PT End of Session - 12/21/20 0949    Visit Number 2    Date for PT Re-Evaluation 03/01/21    Authorization Type UHC    Authorization - Visit Number 2    Authorization - Number of Visits 60    PT Start Time 0930    PT Stop Time 1010    PT Time Calculation (min) 40 min    Activity Tolerance Patient tolerated treatment well;No increased pain    Behavior During Therapy WFL for tasks assessed/performed           Past Medical History:  Diagnosis Date  . Cervical disc disease 2006  . COPD (chronic obstructive pulmonary disease) ([place])   . Emphysema lung ([place])   . 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    . Fibromyalgia    sees Dr. Estanislado Pandy   . Heart murmur   . 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 Hyperlipidemia   . Migraines   . MVP (mitral valve prolapse)    no daily medication  . Peptic ulcer    no problems    Past Surgical History:  Procedure Laterality Date  . CERVICAL LAMINECTOMY  2006  . CESAREAN SECTION  1993  . CHOLECYSTECTOMY  06/2002  . COLONOSCOPY  06/06/2012   per Dr. Olevia Perches, benign polyp, repeat in 10 yrs   . CYSTOSCOPY N/A 09/01/2018   Procedure: CYSTOSCOPY;  Surgeon: Megan Salon, MD;  Location: [facility];  Service: Gynecology;  Laterality: N/A;  . DILATATION & CURETTAGE/HYSTEROSCOPY WITH MYOSURE N/A 04/12/2017   Procedure: DILATATION & CURETTAGE/HYSTEROSCOPY;  Surgeon: Megan Salon, MD;  Location: [facility];  Service: Gynecology;  Laterality: N/A;  . HYSTEROSCOPY     years ago with Dr Radene Knee-? 2000  . TOTAL LAPAROSCOPIC  HYSTERECTOMY WITH SALPINGECTOMY Bilateral 09/01/2018   Procedure: TOTAL LAPAROSCOPIC HYSTERECTOMY WITH SALPINGECTOMY  BSO, REPAIR OF PERINEAL LACERATION;  Surgeon: Megan Salon, MD;  Location: [facility];  Service: Gynecology;  Laterality: Bilateral;  possible BSO  . URETHRAL DILATION      There were no vitals filed for this visit.   Subjective Assessment - 12/21/20 0935    Subjective NO changes since last visit. I am using the vitamin E supoositories and like the desert harvest Gele. There may be a less of urge to urinate using the moisturizers. I am scared to do the yoga due to theleakage.    Patient Stated Goals reduce leakage    Currently in Pain? Yes    Pain Location Vagina    Pain Orientation Mid    Pain Descriptors / Indicators Aching    Pain Onset More than a month ago    Pain Frequency Intermittent    Aggravating Factors  penile penetration vaginally    Pain Relieving Factors no penile penetration vaginally    Multiple Pain Sites No                          Pelvic Floor Special Questions - 12/21/20 0001    Pelvic Floor Internal Exam Patient confirms identification and  approves PT to assess and treat    Exam Type Vaginal             [facility] Adult PT Treatment/Exercise - 12/21/20 0001      Self-Care   Self-Care Other Self-Care Comments    Other Self-Care Comments  discussed vaginal moisturizers and which ones she is feeling better with      Lumbar Exercises: Stretches   Active Hamstring Stretch Right;Left;1 rep;30 seconds    Active Hamstring Stretch Limitations supine stretch    ITB Stretch Right;Left    ITB Stretch Limitations supine with strap    Piriformis Stretch Right;Left;1 rep;30 seconds    Piriformis Stretch Limitations pigeon pose    Other Lumbar Stretch Exercise hip adductor stretch with strap in supine, right, left, 30 sec    Other Lumbar Stretch Exercise happy baby x 1 min      Manual Therapy   Manual Therapy Soft  tissue mobilization    Soft tissue mobilization manual mobilizatio nto bilateral hip adductors, ischiocavernosus, and superficial transverse to elongate tissue                  PT Education - 12/21/20 1013    Education Details Access Code: [email or web address]; discussed with patient about dry needling    Person(s) Educated Patient    Methods Explanation;Demonstration;Verbal cues;Handout    Comprehension Returned demonstration;Verbalized understanding            PT Short Term Goals - 12/07/20 1249      PT SHORT TERM GOAL #1   Title independent with initial HEP with diaphragmatic breathing and stretches    Time 4    Period Weeks    Target Date 01/04/21      PT SHORT TERM GOAL #2   Title understand on how to perform her own perineal massage to lengthen the pelvic floor muscles    Time 4    Period Weeks    Status New    Target Date 01/04/21             PT Long Term Goals - 12/07/20 1250      PT LONG TERM GOAL #1   Title independent with advanced HEP for pelvic floor strength    Time 12    Period Weeks    Target Date 03/01/21      PT LONG TERM GOAL #2   Title urinary leakage decreased so she is wearing </= 1 pad per day    Time 12    Period Weeks    Status New    Target Date 03/01/21      PT LONG TERM GOAL #3   Title pelvic floor strength is >/= 3/5 and able to fully relax so she has increased coordination of the muscles to prevent urinary leakage.    Time 12    Period Weeks    Status New    Target Date 03/01/21      PT LONG TERM GOAL #4   Title able to cough, laugh, lift without leaking urine due to full contraction of the pelvic floor quickly with full relaxation    Time 12    Period Weeks    Status New    Target Date 03/01/21      PT LONG TERM GOAL #5   Title educated on vaginal health to incorporate moisturizers and lubricants to reduce dryness and friction with intercourse    Time 12    Period Weeks    Status New  Target Date 03/01/21                  Plan - 12/21/20 0949    Clinical Impression Statement Patient has tight hip adductors but were relaxed afterwards. Patient is using vitamin E suppositories and Desert harvest Gele to reduce the dryness of the vulva area. Patient has noticed less leakage when she is using the moisturizers and less urgency. Patient was educated on dry needling and will think about it. Patient just started therapy so she has not met any goals at this time.    Personal Factors and Comorbidities Comorbidity 3+;Fitness;Sex    Comorbidities Fibromyalgia; COPD; C-section 1993; Hysterectomy 09/01/2018; Cervical lamenectomy    Examination-Activity Limitations Continence;Lift;Locomotion Level;Carry;Stand;Toileting    Examination-Participation Restrictions Community Activity;Cleaning;Meal Prep;Interpersonal Relationship    Stability/Clinical Decision Making Stable/Uncomplicated    Rehab Potential Excellent    PT Frequency 1x / week   every other week   PT Duration 12 weeks    PT Treatment/Interventions ADLs/Self Care Home Management;Cryotherapy;Moist Heat;Therapeutic activities;Therapeutic exercise;Neuromuscular re-education;Manual techniques;Patient/family education;Dry needling    PT Next Visit Plan dry needling the perineal body if patient is comfortable with it;  manual work to the pelvic floor,  diaphragmatic breathing    PT Home Exercise Plan Access Code: [email or web address]    Recommended Other Services MD signed initial eval    Consulted and Agree with Plan of Care Patient           Patient will benefit from skilled therapeutic intervention in order to improve the following deficits and impairments:  Decreased coordination,Increased fascial restricitons,Pain,Decreased endurance,Increased muscle spasms,Decreased activity tolerance,Decreased strength  Visit Diagnosis: Muscle weakness (generalized)  Other lack of coordination  Urinary incontinence, unspecified type     Problem List Patient Active  Problem List   Diagnosis Date Noted  . Hyperparathyroidism ([place]) 10/18/2020  . Osteoporosis 10/18/2020  . Concussion 10/27/2018  . Postmenopausal bleeding 09/01/2018  . MGUS (monoclonal gammopathy of unknown significance) 02/13/2018  . COPD with emphysema ([place]) 10/04/2017  . Palpitations 07/30/2017  . Shortness of breath 07/30/2017  . Hormone replacement therapy (HRT) 03/30/2017  . Former smoker 03/30/2017  . Other fatigue 06/30/2016  . Insomnia 06/30/2016  . DJD (degenerative joint disease), cervical 06/30/2016  . 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 IBS (irritable bowel syndrome) 06/30/2016  . Interstitial cystitis 06/30/2016  . SHOULDER PAIN, RIGHT 09/05/2010  . GERD 03/24/2009  . SACROILIAC STRAIN 03/24/2009  . Atypical chest pain 01/20/2008  . HEMATURIA UNSPECIFIED 10/14/2007  . FIBROMYALGIA 07/03/2007  . HYPERLIPIDEMIA NEC/NOS 05/16/2007  . HEADACHE 05/09/2007     [facility] [address] [place], [place], [address] Phone: [phone]   Fax:  [phone]  Name: Emily Ward MRN: [id number] Date of Birth: 01/10/1956

## 2021-01-16 NOTE — Progress Notes (Signed)
Office Visit Note  Patient: Emily Ward             Date of Birth: [birth year]           MRN: [id number]             PCP: Laurey Morale, MD Referring: Laurey Morale, MD Visit Date: 01/26/2021 Occupation: @GUAROCC @  Subjective:  Pian in both hands.   History of Present Illness: Amyah Clawson is a 65 y.o. female with a history of fibromyalgia and degenerative disc disease.  She states about 3 weeks ago she developed severe pain and discomfort all over the body with hyperalgesia.  She states the pain eased off over time but her hands continue to hurt.  She also had some discomfort in her feet but not as intense as in her hands.  She has not noticed any joint swelling.  She has a trigger point in her mid back.  She also experiences some discomfort in her elbows.  Activities of Daily Living:  Patient reports morning stiffness for 1 hour.   Patient Denies nocturnal pain.  Difficulty dressing/grooming: Denies Difficulty climbing stairs: Denies Difficulty getting out of chair: Denies Difficulty using hands for taps, buttons, cutlery, and/or writing: Reports  Review of Systems  Constitutional: Negative for fatigue, night sweats, weight gain and weight loss.  HENT: Positive for mouth dryness. Negative for mouth sores, trouble swallowing, trouble swallowing and nose dryness.   Eyes: Negative for pain, redness, visual disturbance and dryness.  Respiratory: Positive for shortness of breath. Negative for cough and difficulty breathing.        COPD  Cardiovascular: Negative for chest pain, palpitations, hypertension, irregular heartbeat and swelling in legs/feet.  Gastrointestinal: Negative for blood in stool, constipation and diarrhea.  Endocrine: Negative for increased urination.  Genitourinary: Negative for vaginal dryness.  Musculoskeletal: Positive for arthralgias, joint pain, myalgias, morning stiffness and myalgias. Negative for joint swelling, muscle weakness and  muscle tenderness.  Skin: Negative for color change, rash, hair loss, skin tightness, ulcers and sensitivity to sunlight.  Allergic/Immunologic: Negative for susceptible to infections.  Neurological: Negative for dizziness, memory loss, night sweats and weakness.  Hematological: Negative for swollen glands.  Psychiatric/Behavioral: Negative for depressed mood and sleep disturbance. The patient is not nervous/anxious.     PMFS History:  Patient Active Problem List   Diagnosis Date Noted  . Hyperparathyroidism ([place]) 10/18/2020  . Osteoporosis 10/18/2020  . Concussion 10/27/2018  . Postmenopausal bleeding 09/01/2018  . MGUS (monoclonal gammopathy of unknown significance) 02/13/2018  . 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 Palpitations 07/30/2017  . Shortness of breath 07/30/2017  . Hormone replacement therapy (HRT) 03/30/2017  . Former smoker 03/30/2017  . Other fatigue 06/30/2016  . Insomnia 06/30/2016  . DJD (degenerative joint disease), cervical 06/30/2016  . 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 IBS (irritable bowel syndrome) 06/30/2016  . Interstitial cystitis 06/30/2016  . SHOULDER PAIN, RIGHT 09/05/2010  . GERD 03/24/2009  . SACROILIAC STRAIN 03/24/2009  . Atypical chest pain 01/20/2008  . HEMATURIA UNSPECIFIED 10/14/2007  . FIBROMYALGIA 07/03/2007  . HYPERLIPIDEMIA NEC/NOS 05/16/2007  . HEADACHE 05/09/2007    Past Medical History:  Diagnosis Date  . Cervical disc disease 2006  . COPD (chronic obstructive pulmonary disease) ([place])   . Emphysema lung ([place])   . 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    . Fibromyalgia    sees Dr.  Anmol Fleck   . Heart murmur   . 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 Hyperlipidemia   . Migraines   . MVP (mitral valve prolapse)    no daily medication  . Peptic ulcer    no problems    Family History  Problem Relation Age of Onset  . Breast  cancer Mother        lumpectomy in late 50/60  . Heart attack Father   . Congestive Heart Failure Father   . Cancer Other        breast/fhx  . Heart disease Other        fhx  . Diabetes type II Sister        and ? brother  . Arthritis Son        psoriatic  . Colon cancer Neg Hx   . Esophageal cancer Neg Hx   . Rectal cancer Neg Hx   . Stomach cancer Neg Hx    Past Surgical History:  Procedure Laterality Date  . CERVICAL LAMINECTOMY  2006  . CESAREAN SECTION  1993  . CHOLECYSTECTOMY  06/2002  . COLONOSCOPY  06/06/2012   per Dr. Olevia Perches, benign polyp, repeat in 10 yrs   . CYSTOSCOPY N/A 09/01/2018   Procedure: CYSTOSCOPY;  Surgeon: Megan Salon, MD;  Location: [facility];  Service: Gynecology;  Laterality: N/A;  . DILATATION & CURETTAGE/HYSTEROSCOPY WITH MYOSURE N/A 04/12/2017   Procedure: DILATATION & CURETTAGE/HYSTEROSCOPY;  Surgeon: Megan Salon, MD;  Location[facility];  Service: Gynecology;  Laterality: N/A;  . HYSTEROSCOPY     years ago with Dr Radene Knee-? 2000  . TOTAL LAPAROSCOPIC HYSTERECTOMY WITH SALPINGECTOMY Bilateral 09/01/2018   Procedure: TOTAL LAPAROSCOPIC HYSTERECTOMY WITH SALPINGECTOMY  BSO, REPAIR OF PERINEAL LACERATION;  Surgeon: Megan Salon, MD;  Location: [facility];  Service: Gynecology;  Laterality: Bilateral;  possible BSO  . URETHRAL DILATION     Social History   Social History Narrative  . Not on file   Immunization History  Administered Date(s) Administered  . Influenza Whole 06/30/2007, 05/04/2010  . Influenza,inj,Quad PF,6+ Mos 07/12/2017, 05/23/2018  . Influenza-Unspecified 06/02/2014, 06/03/2016, 06/20/2020  . MMR 06/30/2007  . Moderna Sars-Covid-2 Vaccination 09/07/2019, 10/12/2019, 07/13/2020  . Pneumococcal Polysaccharide-23 03/23/2019  . Td 09/03/1998, 05/05/2009  . Tdap 10/22/2018     Objective: Vital Signs: BP 112/65 (BP Location: Left Arm, Patient Position: Sitting, Cuff Size:  Normal)   Pulse 78   Resp 14   Ht $R'5\' 5"'MF$  (1.651 m)   Wt 113 lb (51.3 kg)   LMP 09/03/2000 (Approximate)   BMI 18.80 kg/m    Physical Exam Vitals and nursing note reviewed.  Constitutional:      Appearance: She is well-developed.  HENT:     Head: Normocephalic and atraumatic.  Eyes:     Conjunctiva/sclera: Conjunctivae normal.  Cardiovascular:     Rate and Rhythm: Normal rate and regular rhythm.     Heart sounds: Normal heart sounds.  Pulmonary:     Effort: Pulmonary effort is normal.     Breath sounds: Normal breath sounds.  Abdominal:     General: Bowel sounds are normal.     Palpations: Abdomen is soft.  Musculoskeletal:     Cervical back: Normal range of motion.  Lymphadenopathy:     Cervical: No cervical adenopathy.  Skin:    General: Skin is warm and dry.     Capillary Refill: Capillary refill takes less than 2 seconds.  Neurological:     Mental Status: She is alert and oriented to person, place, and time.  Psychiatric:        Behavior: Behavior normal.      Musculoskeletal Exam: C-spine was in good range of motion.  She had bilateral trapezius spasm.  She has some trigger points in her thoracic paraspinal region.  Shoulder joints, elbow joints, wrist joints with good range of motion.  She had no tenderness over MCPs and PIPs.  She has generalized hyperalgesia.  Hip joints, knee joints, ankles, MTPs and PIPs with good range of motion.  She generalized hyperalgesia and hypermobility.  CDAI Exam: CDAI Score: -- Patient Global: --; Provider Global: -- Swollen: --; Tender: -- Joint Exam 01/26/2021   No joint exam has been documented for this visit   There is currently no information documented on the homunculus. Go to the Rheumatology activity and complete the homunculus joint exam.  Investigation: No additional findings.  Imaging: No results found.  Recent Labs: Lab Results  Component Value Date   WBC 4.3 09/06/2020   HGB 13.9 09/06/2020   PLT 177.0  09/06/2020   NA 138 09/06/2020   K 5.2 (H) 09/06/2020   CL 103 09/06/2020   CO2 31 09/06/2020   GLUCOSE 87 09/06/2020   BUN 21 09/06/2020   CREATININE 0.63 09/06/2020   BILITOT 0.3 09/06/2020   ALKPHOS 61 09/06/2020   AST 18 09/06/2020   ALT 7 09/06/2020   PROT 6.9 09/06/2020   ALBUMIN 4.5 09/06/2020   CALCIUM 9.5 09/06/2020   GFRAA >60 10/22/2018    Speciality Comments: No specialty comments available.  Procedures:  Trigger Point Inj  Date/Time: 01/26/2021 3:25 PM Performed by: Bo Merino, MD Authorized by: Bo Merino, MD   Consent Given by:  Patient Site marked: the procedure site was marked   Timeout: prior to procedure the correct patient, procedure, and site was verified   Indications:  Muscle spasm and pain Total # of Trigger Points:  2 Location: neck   Needle Size:  27 G Approach:  Dorsal Medications #1:  0.5 mL lidocaine 1 %; 10 mg triamcinolone acetonide 40 MG/ML Medications #2:  0.5 mL lidocaine 1 %; 10 mg triamcinolone acetonide 40 MG/ML Patient tolerance:  Patient tolerated the procedure well with no immediate complications   Allergies: Divalproex sodium and Nsaids   Assessment / Plan:     Visit Diagnoses: Pain in both hands-she had a flare of fibromyalgia about 3 weeks ago.  She states the aches and pains have eased off but she continues to have pain and discomfort in her bilateral hands.  No tenderness over MCPs PIPs or DIPs was noted.  No synovitis was noted.  I believe the discomfort is coming from underlying fibromyalgia.  Advised her to use compression gloves.  Trapezius muscle spasm -she had to increase trapezius spasm and tenderness.  She has been taking flexeril 10 mg 1 tablet by mouth TID PRN for muscle spasms.  After different treatment options were discussed per her request bilateral trapezius area was injected with cortisone as described above.  She tolerated the procedure well.  Postprocedure precautions were discussed.  DDD  (degenerative disc disease), cervical - MRI performed on 05/03/20: She had facet joint injection in the past by Dr. Ernestina Patches which was helpful.  DDD (degenerative disc disease), lumbar -she continues to have some lower back pain off and on.  Flexeril 10 mg 1 tablet 3 times daily as needed for muscle spasms.   Fibromyalgia -she  is on Cymbalta 30 mg 2 capsules by mouth daily.  She had a flare of fibromyalgia with increased pain and discomfort all over.  She still has hyperalgesia.  The symptoms are improving.  I will refer her to physical therapy.  Primary insomnia -she continues to have insomnia which is fairly well controlled with Ambien 10 mg 1/2 tablet by mouth at bedtime for insomnia.    TMJ (temporomandibular joint syndrome)-she did not have any discomfort today.  Age-related osteoporosis without current pathological fracture - DEXA on 12/13/17: The BMD measured at Femur Total Left is 0.516 g/cm2 with a T-score of -3.9.  She is on Prolia.  Use of calcium, vitamin D and resistive exercises was discussed.  History of gastroesophageal reflux (GERD)  History of IBS  Interstitial cystitis-she has frequency of urination.  She is going to physical therapy for bladder training.  Orders: Orders Placed This Encounter  Procedures  . Trigger Point Inj   No orders of the defined types were placed in this encounter.     Follow-Up Instructions: Return for FMS, DDD.   Bo Merino, MD  Note - This record has been created using Editor, commissioning.  Chart creation errors have been sought, but may not always  have been located. Such creation errors do not reflect on  the standard of medical care.

## 2021-01-18 ENCOUNTER — Other Ambulatory Visit: Payer: Self-pay | Admitting: Rheumatology

## 2021-01-18 ENCOUNTER — Ambulatory Visit: Payer: 59 | Attending: Obstetrics & Gynecology | Admitting: Physical Therapy

## 2021-01-18 ENCOUNTER — Other Ambulatory Visit: Payer: Self-pay

## 2021-01-18 ENCOUNTER — Other Ambulatory Visit: Payer: Self-pay | Admitting: Family Medicine

## 2021-01-18 ENCOUNTER — Encounter: Payer: Self-pay | Admitting: Physical Therapy

## 2021-01-18 DIAGNOSIS — R32 Unspecified urinary incontinence: Secondary | ICD-10-CM | POA: Diagnosis present

## 2021-01-18 DIAGNOSIS — M6281 Muscle weakness (generalized): Secondary | ICD-10-CM | POA: Insufficient documentation

## 2021-01-18 DIAGNOSIS — R278 Other lack of coordination: Secondary | ICD-10-CM | POA: Insufficient documentation

## 2021-01-18 NOTE — Telephone Encounter (Signed)
Last refill-06/28/2020-30 tabs 5 refills Last office visit- 09/06/20  No future office visit scheduled

## 2021-01-18 NOTE — Therapy (Signed)
[facility] [address] [place], [place], [address] Phone: [phone]   Fax:  [phone]  Physical Therapy Treatment  Patient Details  Name: Emily Ward MRN: [id number] Date of Birth: 07-01-1956 Referring Provider (PT): Dr. Hale Bogus   Encounter Date: 01/18/2021   PT End of Session - 01/18/21 1110    Visit Number 3    Date for PT Re-Evaluation 03/01/21    Authorization Type [employer]    Authorization - Visit Number 3    Authorization - Number of Visits 60    PT Start Time 1100    PT Stop Time 1140    PT Time Calculation (min) 40 min    Activity Tolerance Patient tolerated treatment well;No increased pain    Behavior During Therapy WFL for tasks assessed/performed           Past Medical History:  Diagnosis Date  . Cervical disc disease 2006  . COPD (chronic obstructive pulmonary disease) ([place])   . Emphysema lung ([place])   . 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    . Fibromyalgia    sees Dr. Estanislado Pandy   . Heart murmur   . 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 Hyperlipidemia   . Migraines   . MVP (mitral valve prolapse)    no daily medication  . Peptic ulcer    no problems    Past Surgical History:  Procedure Laterality Date  . CERVICAL LAMINECTOMY  2006  . CESAREAN SECTION  1993  . CHOLECYSTECTOMY  06/2002  . COLONOSCOPY  06/06/2012   per Dr. Olevia Perches, benign polyp, repeat in 10 yrs   . CYSTOSCOPY N/A 09/01/2018   Procedure: CYSTOSCOPY;  Surgeon: Megan Salon, MD;  Location: [facility];  Service: Gynecology;  Laterality: N/A;  . DILATATION & CURETTAGE/HYSTEROSCOPY WITH MYOSURE N/A 04/12/2017   Procedure: DILATATION & CURETTAGE/HYSTEROSCOPY;  Surgeon: Megan Salon, MD;  Location: [facility];  Service: Gynecology;  Laterality: N/A;  . HYSTEROSCOPY     years ago with Dr Radene Knee-? 2000  . TOTAL LAPAROSCOPIC  HYSTERECTOMY WITH SALPINGECTOMY Bilateral 09/01/2018   Procedure: TOTAL LAPAROSCOPIC HYSTERECTOMY WITH SALPINGECTOMY  BSO, REPAIR OF PERINEAL LACERATION;  Surgeon: Megan Salon, MD;  Location: [facility];  Service: Gynecology;  Laterality: Bilateral;  possible BSO  . URETHRAL DILATION      There were no vitals filed for this visit.   Subjective Assessment - 01/18/21 1105    Subjective The urinary leakage is better. Some days are better than other. Patient is using 3 pads on a good day. No intercourse since last visit.    Patient Stated Goals reduce leakage    Currently in Pain? No/denies                          Pelvic Floor Special Questions - 01/18/21 0001    Pelvic Floor Internal Exam Patient confirms identification and approves PT to assess and treat    Exam Type Vaginal    Strength weak squeeze, no lift             [facility] Adult PT Treatment/Exercise - 01/18/21 0001      Neuro Re-ed    Neuro Re-ed Details  diaphragmtic breathing to elongate teh pelvic floor with tactile cues      Manual Therapy   Manual Therapy Soft tissue mobilization;Internal  Pelvic Floor    Soft tissue mobilization manual mobilization to the hip adductors and alon gthe pubic rami    Internal Pelvic Floor manual therapy to bulbocavernsosu, ischiocavernosus, perineal body, superior transverse, along the sides of the bladder, levator ani elongation                  PT Education - 01/18/21 1215    Education Details diaphragmatic breathing    Person(s) Educated Patient    Methods Explanation;Demonstration    Comprehension Verbalized understanding;Returned demonstration            PT Short Term Goals - 01/18/21 1221      PT SHORT TERM GOAL #1   Title independent with initial HEP with diaphragmatic breathing and stretches    Time 4    Period Weeks    Status Achieved    Target Date 01/04/21      PT SHORT TERM GOAL #2   Title understand on how to perform  her own perineal massage to lengthen the pelvic floor muscles    Time 4    Period Weeks    Status Achieved             PT Long Term Goals - 01/18/21 1222      PT LONG TERM GOAL #1   Title independent with advanced HEP for pelvic floor strength    Time 12    Period Weeks    Status On-going      PT LONG TERM GOAL #2   Title urinary leakage decreased so she is wearing </= 1 pad per day    Time 12    Period Weeks    Status On-going      PT LONG TERM GOAL #3   Title pelvic floor strength is >/= 3/5 and able to fully relax so she has increased coordination of the muscles to prevent urinary leakage.    Time 12    Period Weeks    Status On-going      PT LONG TERM GOAL #4   Title able to cough, laugh, lift without leaking urine due to full contraction of the pelvic floor quickly with full relaxation    Period Weeks    Status On-going      PT LONG TERM GOAL #5   Title educated on vaginal health to incorporate moisturizers and lubricants to reduce dryness and friction with intercourse    Time 12    Period Weeks                 Plan - 01/18/21 1220    Clinical Impression Statement Patient has tightness in the perineal body, bulbocavernosus, ishiocavernosus, and superior transverse perineum. The hip adductors have increased mobility. Patient is not able to relax her pelvic floor with diaphragmatic breathing. Patient is able to contract her pelvic floor better with therapist giving tactile cues to the lower abdomen. Pelvic floor strength increased to 2/5. Patient will benefit from skilled therapy to improve pelvic floor strength and coordination.    Personal Factors and Comorbidities Comorbidity 3+;Fitness;Sex    Comorbidities Fibromyalgia; COPD; C-section 1993; Hysterectomy 09/01/2018; Cervical lamenectomy    Examination-Activity Limitations Continence;Lift;Locomotion Level;Carry;Stand;Toileting    Examination-Participation Restrictions Community Activity;Cleaning;Meal  Prep;Interpersonal Relationship    Stability/Clinical Decision Making Stable/Uncomplicated    Rehab Potential Excellent    PT Frequency 1x / week   every other week   PT Duration 12 weeks    PT Treatment/Interventions ADLs/Self Care Home Management;Cryotherapy;Moist Heat;Therapeutic activities;Therapeutic exercise;Neuromuscular re-education;Manual  techniques;Patient/family education;Dry needling    PT Next Visit Plan internal work; pelvic floor drop,    PT Home Exercise Plan Access Code: [email or web address]    Consulted and Agree with Plan of Care Patient           Patient will benefit from skilled therapeutic intervention in order to improve the following deficits and impairments:  Decreased coordination,Increased fascial restricitons,Pain,Decreased endurance,Increased muscle spasms,Decreased activity tolerance,Decreased strength  Visit Diagnosis: Muscle weakness (generalized)  Other lack of coordination  Urinary incontinence, unspecified type     Problem List Patient Active Problem List   Diagnosis Date Noted  . Hyperparathyroidism ([place]) 10/18/2020  . Osteoporosis 10/18/2020  . Concussion 10/27/2018  . Postmenopausal bleeding 09/01/2018  . MGUS (monoclonal gammopathy of unknown significance) 02/13/2018  . 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 Palpitations 07/30/2017  . Shortness of breath 07/30/2017  . Hormone replacement therapy (HRT) 03/30/2017  . Former smoker 03/30/2017  . Other fatigue 06/30/2016  . Insomnia 06/30/2016  . DJD (degenerative joint disease), cervical 06/30/2016  . 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 IBS (irritable bowel syndrome) 06/30/2016  . Interstitial cystitis 06/30/2016  . SHOULDER PAIN, RIGHT 09/05/2010  . GERD 03/24/2009  . SACROILIAC STRAIN 03/24/2009  . Atypical chest pain 01/20/2008  . HEMATURIA UNSPECIFIED 10/14/2007  . FIBROMYALGIA 07/03/2007  . HYPERLIPIDEMIA NEC/NOS 05/16/2007  . HEADACHE  05/09/2007    Earlie Counts, PT 01/18/21 12:45 PM   [facility] [address], [place], [place], [address] Phone: [phone]   Fax:  [phone]  Name: Emily Ward MRN: [id number] Date of Birth: 10/19/1955

## 2021-01-18 NOTE — Telephone Encounter (Signed)
Next Visit: 01/26/2021  Last Visit: 09/13/2020  Last Fill: 10/21/2020  DX: Fibromyalgia   Current Dose per office note on 09/13/2020: Cymbalta 30 mg 2 capsules by mouth daily  Okay to refill cymbalta?

## 2021-01-26 ENCOUNTER — Encounter: Payer: Self-pay | Admitting: Rheumatology

## 2021-01-26 ENCOUNTER — Other Ambulatory Visit: Payer: Self-pay

## 2021-01-26 ENCOUNTER — Ambulatory Visit: Payer: 59 | Admitting: Rheumatology

## 2021-01-26 VITALS — BP 112/65 | HR 78 | Resp 14 | Ht 65.0 in | Wt 113.0 lb

## 2021-01-26 DIAGNOSIS — R5383 Other fatigue: Secondary | ICD-10-CM

## 2021-01-26 DIAGNOSIS — M26609 Unspecified temporomandibular joint disorder, unspecified side: Secondary | ICD-10-CM

## 2021-01-26 DIAGNOSIS — M62838 Other muscle spasm: Secondary | ICD-10-CM

## 2021-01-26 DIAGNOSIS — F5101 Primary insomnia: Secondary | ICD-10-CM

## 2021-01-26 DIAGNOSIS — R233 Spontaneous ecchymoses: Secondary | ICD-10-CM

## 2021-01-26 DIAGNOSIS — M5030 Other cervical disc degeneration, unspecified cervical region: Secondary | ICD-10-CM | POA: Diagnosis not present

## 2021-01-26 DIAGNOSIS — N3010 Interstitial cystitis (chronic) without hematuria: Secondary | ICD-10-CM

## 2021-01-26 DIAGNOSIS — M810 Age-related osteoporosis without current pathological fracture: Secondary | ICD-10-CM

## 2021-01-26 DIAGNOSIS — M79642 Pain in left hand: Secondary | ICD-10-CM

## 2021-01-26 DIAGNOSIS — Z8719 Personal history of other diseases of the digestive system: Secondary | ICD-10-CM

## 2021-01-26 DIAGNOSIS — M79641 Pain in right hand: Secondary | ICD-10-CM

## 2021-01-26 DIAGNOSIS — M47812 Spondylosis without myelopathy or radiculopathy, cervical region: Secondary | ICD-10-CM

## 2021-01-26 DIAGNOSIS — M5136 Other intervertebral disc degeneration, lumbar region: Secondary | ICD-10-CM

## 2021-01-26 DIAGNOSIS — M797 Fibromyalgia: Secondary | ICD-10-CM

## 2021-01-26 MED ORDER — LIDOCAINE HCL 1 % IJ SOLN
0.5000 mL | INTRAMUSCULAR | Status: AC | PRN
  Administered 2021-01-26: .5 mL

## 2021-01-26 MED ORDER — TRIAMCINOLONE ACETONIDE 40 MG/ML IJ SUSP
10.0000 mg | INTRAMUSCULAR | Status: AC | PRN
  Administered 2021-01-26: 10 mg via INTRAMUSCULAR

## 2021-02-02 ENCOUNTER — Ambulatory Visit: Payer: 59 | Attending: Obstetrics & Gynecology | Admitting: Physical Therapy

## 2021-02-02 ENCOUNTER — Other Ambulatory Visit: Payer: Self-pay

## 2021-02-02 ENCOUNTER — Encounter: Payer: Self-pay | Admitting: Physical Therapy

## 2021-02-02 DIAGNOSIS — R32 Unspecified urinary incontinence: Secondary | ICD-10-CM | POA: Diagnosis present

## 2021-02-02 DIAGNOSIS — M6281 Muscle weakness (generalized): Secondary | ICD-10-CM | POA: Insufficient documentation

## 2021-02-02 DIAGNOSIS — R278 Other lack of coordination: Secondary | ICD-10-CM | POA: Diagnosis present

## 2021-02-02 NOTE — Therapy (Signed)
[facility] [address] [place], [place], [address] Phone: [phone]   Fax:  [phone]  Physical Therapy Treatment  Patient Details  Name: Emily Ward MRN: [id number] Date of Birth: Jul 04, 1956 Referring Provider (PT): Dr. Hale Bogus   Encounter Date: 02/02/2021   PT End of Session - 02/02/21 1358    Visit Number 4    Date for PT Re-Evaluation 03/01/21    Authorization Type UHC    Authorization - Visit Number 4    Authorization - Number of Visits 60    PT Start Time 1400    PT Stop Time 1440    PT Time Calculation (min) 40 min    Activity Tolerance Patient tolerated treatment well;No increased pain    Behavior During Therapy WFL for tasks assessed/performed           Past Medical History:  Diagnosis Date  . Cervical disc disease 2006  . COPD (chronic obstructive pulmonary disease) ([place])   . Emphysema lung ([place])   . 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    . Fibromyalgia    sees Dr. Estanislado Pandy   . Heart murmur   . 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 Hyperlipidemia   . Migraines   . MVP (mitral valve prolapse)    no daily medication  . Peptic ulcer    no problems    Past Surgical History:  Procedure Laterality Date  . CERVICAL LAMINECTOMY  2006  . CESAREAN SECTION  1993  . CHOLECYSTECTOMY  06/2002  . COLONOSCOPY  06/06/2012   per Dr. Olevia Perches, benign polyp, repeat in 10 yrs   . CYSTOSCOPY N/A 09/01/2018   Procedure: CYSTOSCOPY;  Surgeon: Megan Salon, MD;  Location: [facility];  Service: Gynecology;  Laterality: N/A;  . DILATATION & CURETTAGE/HYSTEROSCOPY WITH MYOSURE N/A 04/12/2017   Procedure: DILATATION & CURETTAGE/HYSTEROSCOPY;  Surgeon: Megan Salon, MD;  Location: [facility];  Service: Gynecology;  Laterality: N/A;  . HYSTEROSCOPY     years ago with Dr Radene Knee-? 2000  . TOTAL LAPAROSCOPIC  HYSTERECTOMY WITH SALPINGECTOMY Bilateral 09/01/2018   Procedure: TOTAL LAPAROSCOPIC HYSTERECTOMY WITH SALPINGECTOMY  BSO, REPAIR OF PERINEAL LACERATION;  Surgeon: Megan Salon, MD;  Location: [facility];  Service: Gynecology;  Laterality: Bilateral;  possible BSO  . URETHRAL DILATION      There were no vitals filed for this visit.   Subjective Assessment - 02/02/21 1403    Subjective I had a bad couple of weeks and a huge Fibromyalgia flare-up. I was not sleeping well and not wanting to do exercise due to pain. Yesterday I have felt better. I am going back to Integrative therapy for the fibromyalgia and will start next Tuesday.    Patient Stated Goals reduce leakage    Currently in Pain? No/denies                             [facility] Adult PT Treatment/Exercise - 02/02/21 0001      Neuro Re-ed    Neuro Re-ed Details  diaphragmatic breathing to elongate the pelvic floor and able to perform correctly      Lumbar Exercises: Stretches   Active Hamstring Stretch Right;Left;1 rep;30 seconds    Active Hamstring Stretch Limitations supine    Piriformis Stretch Right;Left;1 rep;30 seconds    Piriformis Stretch Limitations  supine with trunk rotation    Other Lumbar Stretch Exercise hip adductor stretch in sitting holding for 1 mon      Lumbar Exercises: Supine   Straight Leg Raise 10 reps;1 second    Straight Leg Raises Limitations each leg with keeping the pelvis leved and contract the pelvic floor    Other Supine Lumbar Exercises pelvic floor contraction in supine with pilow under knees holding for 5 sec. 10x    Other Supine Lumbar Exercises supine with legs at 90/90 with ball between legs and bilateral shoulder extension 15x with pelvic floor contraction                  PT Education - 02/02/21 1442    Education Details Access Code: [email or web address]    Person(s) Educated Patient    Methods Explanation;Demonstration;Verbal cues;Handout    Comprehension  Returned demonstration;Verbalized understanding            PT Short Term Goals - 01/18/21 1221      PT SHORT TERM GOAL #1   Title independent with initial HEP with diaphragmatic breathing and stretches    Time 4    Period Weeks    Status Achieved    Target Date 01/04/21      PT SHORT TERM GOAL #2   Title understand on how to perform her own perineal massage to lengthen the pelvic floor muscles    Time 4    Period Weeks    Status Achieved             PT Long Term Goals - 02/02/21 1408      PT LONG TERM GOAL #1   Title independent with advanced HEP for pelvic floor strength    Time 12    Period Weeks    Status On-going      PT LONG TERM GOAL #2   Title urinary leakage decreased so she is wearing </= 1 pad per day    Baseline down to 2 pads for leakage    Time 12    Period Weeks    Status On-going      PT LONG TERM GOAL #3   Title pelvic floor strength is >/= 3/5 and able to fully relax so she has increased coordination of the muscles to prevent urinary leakage.    Time 12    Period Weeks    Status On-going      PT LONG TERM GOAL #4   Title able to cough, laugh, lift without leaking urine due to full contraction of the pelvic floor quickly with full relaxation    Time 12    Period Weeks    Status On-going      PT LONG TERM GOAL #5   Title educated on vaginal health to incorporate moisturizers and lubricants to reduce dryness and friction with intercourse    Time 12    Period Weeks    Status Achieved                 Plan - 02/02/21 1442    Clinical Impression Statement Patient had a flare-up with her Fibromyalgia in the past several weeks and she was not able to do as much. Patient reports she is now down to 2 pads. She is able to feel the pelvic floor contract for the first time. She is able to do diaphragmatic breathing and relax her pelvic floor for the first time. Patient will benefit from skilled therapy to improve pelvic floor strength and  coordination.    Personal Factors and Comorbidities Comorbidity 3+;Fitness;Sex    Comorbidities Fibromyalgia; COPD; C-section 1993; Hysterectomy 09/01/2018; Cervical lamenectomy    Examination-Activity Limitations Continence;Lift;Locomotion Level;Carry;Stand;Toileting    Examination-Participation Restrictions Community Activity;Cleaning;Meal Prep;Interpersonal Relationship    Stability/Clinical Decision Making Stable/Uncomplicated    Rehab Potential Excellent    PT Frequency 1x / week   every other week   PT Duration 12 weeks    PT Treatment/Interventions ADLs/Self Care Home Management;Cryotherapy;Moist Heat;Therapeutic activities;Therapeutic exercise;Neuromuscular re-education;Manual techniques;Patient/family education;Dry needling    PT Next Visit Plan progress exercises to standing and quadruped    PT Home Exercise Plan Access Code: [email or web address]    Consulted and Agree with Plan of Care Patient           Patient will benefit from skilled therapeutic intervention in order to improve the following deficits and impairments:  Decreased coordination,Increased fascial restricitons,Pain,Decreased endurance,Increased muscle spasms,Decreased activity tolerance,Decreased strength  Visit Diagnosis: Muscle weakness (generalized)  Other lack of coordination  Urinary incontinence, unspecified type     Problem List Patient Active Problem List   Diagnosis Date Noted  . Hyperparathyroidism ([place]) 10/18/2020  . Osteoporosis 10/18/2020  . Concussion 10/27/2018  . Postmenopausal bleeding 09/01/2018  . MGUS (monoclonal gammopathy of unknown significance) 02/13/2018  . COPD with emphysema ([place]) 10/04/2017  . Palpitations 07/30/2017  . Shortness of breath 07/30/2017  . Hormone replacement therapy (HRT) 03/30/2017  . Former smoker 03/30/2017  . Other fatigue 06/30/2016  . Insomnia 06/30/2016  . DJD (degenerative joint disease), cervical 06/30/2016  . Spondylosis of lumbar region without myelopathy  or radiculopathy 06/30/2016  . IBS (irritable bowel syndrome) 06/30/2016  . Interstitial cystitis 06/30/2016  . SHOULDER PAIN, RIGHT 09/05/2010  . GERD 03/24/2009  . SACROILIAC STRAIN 03/24/2009  . Atypical chest pain 01/20/2008  . HEMATURIA UNSPECIFIED 10/14/2007  . FIBROMYALGIA 07/03/2007  . HYPERLIPIDEMIA NEC/NOS 05/16/2007  . HEADACHE 05/09/2007    Earlie Counts, PT 02/02/21 2:45 PM   [facility] [address] [place], [place], [address] Phone: [phone]   Fax:  [phone]  Name: Emily Ward MRN: [id number] Date of Birth: 1955-10-18

## 2021-02-02 NOTE — Patient Instructions (Signed)
Access Code: [email or web address] URL: [email or web address] Date: 02/02/2021 Prepared by: Earlie Counts  Exercises Supine Transversus Abdominis Bracing with Leg Extension - 1 x daily - 7 x weekly - 1 sets - 10 reps Supine 90/90 Shoulder Flexion with Abdominal Bracing - 1 x daily - 7 x weekly - 1 sets - 10 reps [facility] [address] [place], [place] [address] Phone # [phone] Fax [phone]

## 2021-02-06 ENCOUNTER — Other Ambulatory Visit: Payer: Self-pay

## 2021-02-07 ENCOUNTER — Ambulatory Visit: Payer: 59 | Admitting: Family Medicine

## 2021-02-07 ENCOUNTER — Encounter: Payer: Self-pay | Admitting: Family Medicine

## 2021-02-07 VITALS — BP 120/78 | HR 77 | Temp 97.7°F | Wt 111.2 lb

## 2021-02-07 DIAGNOSIS — N390 Urinary tract infection, site not specified: Secondary | ICD-10-CM | POA: Diagnosis not present

## 2021-02-07 DIAGNOSIS — R300 Dysuria: Secondary | ICD-10-CM

## 2021-02-07 LAB — POC URINALSYSI DIPSTICK (AUTOMATED)
Bilirubin, UA: NEGATIVE
Blood, UA: POSITIVE
Glucose, UA: NEGATIVE
Ketones, UA: NEGATIVE
Nitrite, UA: NEGATIVE
Protein, UA: NEGATIVE
Spec Grav, UA: 1.015 (ref 1.010–1.025)
Urobilinogen, UA: 0.2 E.U./dL
pH, UA: 5.5 (ref 5.0–8.0)

## 2021-02-07 MED ORDER — CIPROFLOXACIN HCL 500 MG PO TABS: 500.0000 mg | ORAL_TABLET | Freq: Two times a day (BID) | ORAL | 0 refills | Status: DC

## 2021-02-07 NOTE — Progress Notes (Signed)
   Subjective:    Patient ID: Emily Ward, female    DOB: [birth year], 65 y.o.   MRN: [id number]  HPI Here for 4 days of low back pain, nausea, and urgency to urinate. No fever. Drinking lots of water.    Review of Systems  Constitutional: Negative.   Respiratory: Negative.   Cardiovascular: Negative.   Gastrointestinal: Negative.   Genitourinary: Positive for frequency and urgency. Negative for dysuria, flank pain and hematuria.       Objective:   Physical Exam Constitutional:      Appearance: Normal appearance.  Cardiovascular:     Rate and Rhythm: Normal rate and regular rhythm.     Pulses: Normal pulses.     Heart sounds: Normal heart sounds.  Pulmonary:     Effort: Pulmonary effort is normal.     Breath sounds: Normal breath sounds.  Abdominal:     General: Abdomen is flat. Bowel sounds are normal. There is no distension.     Palpations: Abdomen is soft. There is no mass.     Tenderness: There is no abdominal tenderness. There is no right CVA tenderness, left CVA tenderness, guarding or rebound.     Hernia: No hernia is present.  Neurological:     Mental Status: She is alert.           Assessment & Plan:  UTI, treat with Cipro. Culture the sample.  Alysia Penna, MD

## 2021-02-13 LAB — URINE CULTURE
MICRO NUMBER:: 11988790
SPECIMEN QUALITY:: ADEQUATE

## 2021-02-15 ENCOUNTER — Encounter: Payer: Self-pay | Admitting: Physical Therapy

## 2021-02-15 ENCOUNTER — Ambulatory Visit: Payer: 59 | Admitting: Physical Therapy

## 2021-02-15 ENCOUNTER — Other Ambulatory Visit: Payer: Self-pay

## 2021-02-15 DIAGNOSIS — R32 Unspecified urinary incontinence: Secondary | ICD-10-CM

## 2021-02-15 DIAGNOSIS — M6281 Muscle weakness (generalized): Secondary | ICD-10-CM

## 2021-02-15 DIAGNOSIS — R278 Other lack of coordination: Secondary | ICD-10-CM

## 2021-02-15 NOTE — Therapy (Signed)
[facility] [address], [place], [place], [address] Phone: [phone]   Fax:  [phone]  Physical Therapy Treatment  Patient Details  Name: Emily Ward MRN: [id number] Date of Birth: [birth year] Referring Provider (PT): Dr. Hale Bogus   Encounter Date: 02/15/2021   PT End of Session - 02/15/21 1443     Visit Number 5    Date for PT Re-Evaluation 03/01/21    Authorization Type UHC    Authorization - Visit Number 5    Authorization - Number of Visits 60    PT Start Time 1400    PT Stop Time 1440    PT Time Calculation (min) 40 min    Activity Tolerance Patient tolerated treatment well;No increased pain    Behavior During Therapy [facility] for tasks assessed/performed             Past Medical History:  Diagnosis Date   Cervical disc disease 2006   COPD (chronic obstructive pulmonary disease) ([place])    Emphysema lung ([place])    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     Fibromyalgia    sees Dr. 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 Olevia Perches, benign polyp, repeat in 10 yrs    CYSTOSCOPY N/A 09/01/2018   Procedure: CYSTOSCOPY;  Surgeon: Megan Salon, MD;  Location: [facility];  Service: Gynecology;  Laterality: N/A;   DILATATION & CURETTAGE/HYSTEROSCOPY WITH MYOSURE N/A 04/12/2017   Procedure: DILATATION & CURETTAGE/HYSTEROSCOPY;  Surgeon: Megan Salon, MD;  Location: [facility];  Service: Gynecology;  Laterality: N/A;   HYSTEROSCOPY     years ago with Dr Radene Knee-? 2000   TOTAL LAPAROSCOPIC HYSTERECTOMY  WITH SALPINGECTOMY Bilateral 09/01/2018   Procedure: TOTAL LAPAROSCOPIC HYSTERECTOMY WITH SALPINGECTOMY  BSO, REPAIR OF PERINEAL LACERATION;  Surgeon: Megan Salon, MD;  Location: [place];  Service: Gynecology;  Laterality: Bilateral;  possible BSO   URETHRAL DILATION      There were no vitals filed for this visit.   Subjective Assessment - 02/15/21 1403     Subjective I thought I had a UTI with blood in the urine, pain in the back, and felt sick. I was placed on Cipro and felt better. Sunday I had bad bladder spasms and now have a yeast infection from the Cipro. Today the spasms are not as bad.    Patient Stated Goals reduce leakage    Currently in Pain? No/denies    Multiple Pain Sites No                               [facility] Adult PT Treatment/Exercise - 02/15/21 0001       Manual Therapy   Manual Therapy Myofascial release;Soft tissue mobilization    Soft tissue mobilization to the diaphragm    Myofascial Release release of the urogenital diaphragm with one hand on the lower abdomen and other on the back to releasae  through the fascia; release suprapubically for the bladder. along the midline of the abdomen, release for the left side of the bladder to the left uppper quadrant; release of the left upper quadrant going through the planes of fascia                      PT Short Term Goals - 01/18/21 1221       PT SHORT TERM GOAL #1   Title independent with initial HEP with diaphragmatic breathing and stretches    Time 4    Period Weeks    Status Achieved    Target Date 01/04/21      PT SHORT TERM GOAL #2   Title understand on how to perform her own perineal massage to lengthen the pelvic floor muscles    Time 4    Period Weeks    Status Achieved               PT Long Term Goals - 02/15/21 1407       PT LONG TERM GOAL #1   Title independent with advanced HEP for pelvic floor strength    Time 12    Period Weeks     Status On-going      PT LONG TERM GOAL #2   Title urinary leakage decreased so she is wearing </= 1 pad per day    Baseline down to 4-5 thin pads for leakage since UTI    Time 12    Period Weeks    Status On-going      PT LONG TERM GOAL #3   Title pelvic floor strength is >/= 3/5 and able to fully relax so she has increased coordination of the muscles to prevent urinary leakage.    Time 12    Period Weeks    Status On-going      PT LONG TERM GOAL #4   Title able to cough, laugh, lift without leaking urine due to full contraction of the pelvic floor quickly with full relaxation    Time 12    Period Weeks      PT LONG TERM GOAL #5   Title educated on vaginal health to incorporate moisturizers and lubricants to reduce dryness and friction with intercourse    Time 12    Period Weeks    Status Achieved                   Plan - 02/15/21 1444     Clinical Impression Statement Patient had a UTI then yeast infection since last visit. She is now using 4-5 thin pads due to vaginal discharge and leakage. The pads are damp. Patient was able to increase her breath after work. She continues to have restrictions in the superior umbilicus. Patient was set back due to the UTI and yeast infection and not able to do internal work due ot still on medication. Patient was not having to be in constant motion after the manual work. Patient will benefit from skilled therapy to improve pelvic floor strength and coordination.    Personal Factors and Comorbidities Comorbidity 3+;Fitness;Sex    Comorbidities Fibromyalgia; COPD; C-section 1993; Hysterectomy 09/01/2018; Cervical lamenectomy    Examination-Activity Limitations Continence;Lift;Locomotion Level;Carry;Stand;Toileting    Examination-Participation Restrictions Community Activity;Cleaning;Meal Prep;Interpersonal Relationship    Stability/Clinical Decision Making Stable/Uncomplicated    Rehab Potential Excellent    PT Frequency 1x / week    every other week   PT Duration 12 weeks  PT Treatment/Interventions ADLs/Self Care Home Management;Cryotherapy;Moist Heat;Therapeutic activities;Therapeutic exercise;Neuromuscular re-education;Manual techniques;Patient/family education;Dry needling    PT Next Visit Plan progress exercises to standing and quadruped; manual work around the umbilicus; see if bladder spasms and leakage has reduced    PT Home Exercise Plan Access Code: [email or web address]    Consulted and Agree with Plan of Care Patient             Patient will benefit from skilled therapeutic intervention in order to improve the following deficits and impairments:  Decreased coordination, Increased fascial restricitons, Pain, Decreased endurance, Increased muscle spasms, Decreased activity tolerance, Decreased strength  Visit Diagnosis: Muscle weakness (generalized)  Other lack of coordination  Urinary incontinence, unspecified type     Problem List Patient Active Problem List   Diagnosis Date Noted   Hyperparathyroidism ([place]) 10/18/2020   Osteoporosis 10/18/2020   Concussion 10/27/2018   Postmenopausal bleeding 09/01/2018   MGUS (monoclonal gammopathy of unknown significance) 02/13/2018   COPD with emphysema ([place]) 10/04/2017   Palpitations 07/30/2017   Shortness of breath 07/30/2017   Hormone replacement therapy (HRT) 03/30/2017   Former smoker 03/30/2017   Other fatigue 06/30/2016   Insomnia 06/30/2016   DJD (degenerative joint disease), cervical 06/30/2016   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/2016   IBS (irritable bowel syndrome) 06/30/2016   Interstitial cystitis 06/30/2016   SHOULDER PAIN, RIGHT 09/05/2010   GERD 03/24/2009   SACROILIAC STRAIN 03/24/2009   Atypical chest pain 01/20/2008   HEMATURIA UNSPECIFIED 10/14/2007   FIBROMYALGIA 07/03/2007   HYPERLIPIDEMIA NEC/NOS 05/16/2007   HEADACHE 05/09/2007    Earlie Counts, PT 02/15/21 2:48 PM   [facility] [address] [place], [place], [address] Phone: [phone]   Fax:  [phone]  Name: Emily Ward MRN: [id number] Date of Birth: 1956-04-24

## 2021-02-28 ENCOUNTER — Other Ambulatory Visit: Payer: Self-pay | Admitting: Pulmonary Disease

## 2021-03-01 ENCOUNTER — Encounter: Payer: Self-pay | Admitting: Physical Therapy

## 2021-03-01 ENCOUNTER — Other Ambulatory Visit: Payer: Self-pay

## 2021-03-01 ENCOUNTER — Ambulatory Visit: Payer: 59 | Admitting: Physical Therapy

## 2021-03-01 DIAGNOSIS — R278 Other lack of coordination: Secondary | ICD-10-CM

## 2021-03-01 DIAGNOSIS — R32 Unspecified urinary incontinence: Secondary | ICD-10-CM

## 2021-03-01 DIAGNOSIS — M6281 Muscle weakness (generalized): Secondary | ICD-10-CM

## 2021-03-01 NOTE — Therapy (Signed)
[facility] [address] [place], [place], [address] Phone: [phone]   Fax:  [phone]  Physical Therapy Treatment  Patient Details  Name: Emily Ward MRN: [id number] Date of Birth: 1955-11-25 Referring Provider (PT): Dr. Hale Bogus   Encounter Date: 03/01/2021   PT End of Session - 03/01/21 1440     Visit Number 6    Date for PT Re-Evaluation 05/24/21    Authorization Type UHC    Authorization - Visit Number 6    Authorization - Number of Visits 60    PT Start Time 1400    PT Stop Time 4034    PT Time Calculation (min) 38 min    Activity Tolerance Patient tolerated treatment well;No increased pain    Behavior During Therapy [facility] for tasks assessed/performed             Past Medical History:  Diagnosis Date   Cervical disc disease 2006   COPD (chronic obstructive pulmonary disease) ([place])    Emphysema lung ([place])    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     Fibromyalgia    sees Dr. 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 Olevia Perches, benign polyp, repeat in 10 yrs    CYSTOSCOPY N/A 09/01/2018   Procedure: CYSTOSCOPY;  Surgeon: Megan Salon, MD;  Location: [facility];  Service: Gynecology;  Laterality: N/A;   DILATATION & CURETTAGE/HYSTEROSCOPY WITH MYOSURE N/A 04/12/2017   Procedure: DILATATION & CURETTAGE/HYSTEROSCOPY;  Surgeon: Megan Salon, MD;  Location: [facility];  Service: Gynecology;  Laterality: N/A;   HYSTEROSCOPY     years ago with Dr Radene Knee-? 2000   TOTAL LAPAROSCOPIC HYSTERECTOMY  WITH SALPINGECTOMY Bilateral 09/01/2018   Procedure: TOTAL LAPAROSCOPIC HYSTERECTOMY WITH SALPINGECTOMY  BSO, REPAIR OF PERINEAL LACERATION;  Surgeon: Megan Salon, MD;  Location: [place];  Service: Gynecology;  Laterality: Bilateral;  possible BSO   URETHRAL DILATION      There were no vitals filed for this visit.   Subjective Assessment - 03/01/21 1359     Subjective I feel better. I have bladder spasms here and there but not the constant bladder spasms. I had my second COVID booster and had a reaction. No intercourse for awhile so do not know about pain. The urinary leakage is not changed due to being in bed for 4 days from the booster shot.    Patient Stated Goals reduce leakage    Currently in Pain? No/denies                [facility] PT Assessment - 03/01/21 0001       Assessment   Medical Diagnosis R32 urinary incontinence in female    Referring Provider (PT) Dr. 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    Leisure walking, would like to get into yoga      Cognition   Overall Cognitive Status Within Functional Limits for tasks assessed      Strength   Right Hip Flexion 4+/5    Right Hip Extension 5/5    Right Hip External Rotation  5/5    Right Hip Internal Rotation 4/5    Left Hip Flexion 4+/5    Left Hip External Rotation 5/5    Left Hip Internal Rotation 4/5    Left Hip ABduction 4/5    Left Hip ADduction 4+/5                        Pelvic Floor Special Questions - 03/01/21 0001     Pad use 3-5 pads per day    Activities that cause leaking  Coughing;Sneezing;Laughing;Lifting;Bending;Walking   no urinary leakage at night   Urinary urgency Yes   does not leak on the way to the commode   Urinary frequency always has had issues with emptying her bladder; the meatus feels irritated    Strength weak squeeze, no lift               [facility] Adult PT Treatment/Exercise - 03/01/21 0001       Neuro Re-ed    Neuro Re-ed Details  diaphragmatic breathing to elongate the pelvic floor and able to perform correctly; urge to void drill with quick flicks and ways to deter the urge.      Manual Therapy   Manual Therapy Myofascial release    Myofascial Release release of the urogenital diaphragm with one hand on the lower abdomen and other on the back to releasae through the fascia; release suprapubically for the bladder. along the midline of the abdomen, release around the umbilicus and suprapubic ligaments                    PT Education - 03/01/21 1437     Education Details urge to void    Person(s) Educated Patient    Methods Explanation;Handout    Comprehension Verbalized understanding              PT Short Term Goals - 01/18/21 1221       PT SHORT TERM GOAL #1   Title independent with initial HEP with diaphragmatic breathing and stretches    Time 4    Period Weeks    Status Achieved    Target Date 01/04/21      PT SHORT TERM GOAL #2   Title understand on how to perform her own perineal massage to lengthen the pelvic floor muscles    Time 4    Period Weeks    Status Achieved               PT Long Term Goals - 03/01/21 1406       PT LONG TERM GOAL #1   Title independent with advanced HEP for pelvic floor strength    Time 12    Period Weeks    Status On-going      PT LONG TERM GOAL #2   Title urinary leakage decreased so she is wearing </= 1 pad per day  Baseline down to 3-5 thin pads for leakage since UTI    Time 12    Period Weeks    Status On-going      PT LONG TERM GOAL #3   Title  pelvic floor strength is >/= 3/5 and able to fully relax so she has increased coordination of the muscles to prevent urinary leakage.    Time 12    Period Weeks    Status On-going      PT LONG TERM GOAL #4   Title able to cough, laugh, lift without leaking urine due to full contraction of the pelvic floor quickly with full relaxation    Time 12    Period Weeks    Status On-going      PT LONG TERM GOAL #5   Title educated on vaginal health to incorporate moisturizers and lubricants to reduce dryness and friction with intercourse    Time 12    Period Weeks    Status Achieved                   Plan - 03/01/21 1441     Clinical Impression Statement Patient has increased pelvic floor strength to 2/5. She is wearing 3-5 pads instead of 5-8. Patient pads are damp when changed and could also be due to discharge She has increased in bilateral hip strength. Patient continues to have fascial restrictions in the lower abdomen and around the umbilicus. Patient had some set backs with a UTI ,yeast infection, and layed in bed for 4 days after she had the COVID booster. Patient has difficulty with relaxing the pelvic floor. She will be able to relax better after fascial work is done on the lower abdominal area. Patient continues to have bladder spasms and that is when she leaks. She has learned the urge to void and will work on this for the next week. Patient has not had intercourse with her husband for awhile so does not know if there is pain. She is using creams for vaginal dryness. Patient will benefit from skilled therapy to improve pelvic floor strength and coordination.    Personal Factors and Comorbidities Comorbidity 3+;Fitness;Sex    Comorbidities Fibromyalgia; COPD; C-section 1993; Hysterectomy 09/01/2018; Cervical lamenectomy    Examination-Activity Limitations Continence;Lift;Locomotion Level;Carry;Stand;Toileting    Examination-Participation Restrictions Community  Activity;Cleaning;Meal Prep;Interpersonal Relationship    Stability/Clinical Decision Making Stable/Uncomplicated    Rehab Potential Excellent    PT Frequency 1x / week    PT Duration 12 weeks    PT Treatment/Interventions ADLs/Self Care Home Management;Cryotherapy;Moist Heat;Therapeutic activities;Therapeutic exercise;Neuromuscular re-education;Manual techniques;Patient/family education;Dry needling    PT Next Visit Plan progress exercises to standing and quadruped; manual work around the umbilicus; fascial work on lower abdomen, internal work    PT Home Exercise Plan Access Code: [email or web address]    Recommended Other Services sent MD renewal    Consulted and Agree with Plan of Care Patient             Patient will benefit from skilled therapeutic intervention in order to improve the following deficits and impairments:  Decreased coordination, Increased fascial restricitons, Pain, Decreased endurance, Increased muscle spasms, Decreased activity tolerance, Decreased strength  Visit Diagnosis: Muscle weakness (generalized) - Plan: PT plan of care cert/re-cert  Other lack of coordination - Plan: PT plan of care cert/re-cert  Urinary incontinence, unspecified type - Plan: PT plan of care cert/re-cert     Problem List Patient Active Problem List   Diagnosis Date Noted   Hyperparathyroidism ([place])  10/18/2020   Osteoporosis 10/18/2020   Concussion 10/27/2018   Postmenopausal bleeding 09/01/2018   MGUS (monoclonal gammopathy of unknown significance) 02/13/2018   COPD with emphysema ([place]) 10/04/2017   Palpitations 07/30/2017   Shortness of breath 07/30/2017   Hormone replacement therapy (HRT) 03/30/2017   Former smoker 03/30/2017   Other fatigue 06/30/2016   Insomnia 06/30/2016   DJD (degenerative joint disease), cervical 06/30/2016   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/2016   IBS (irritable bowel syndrome) 06/30/2016   Interstitial cystitis 06/30/2016    SHOULDER PAIN, RIGHT 09/05/2010   GERD 03/24/2009   SACROILIAC STRAIN 03/24/2009   Atypical chest pain 01/20/2008   HEMATURIA UNSPECIFIED 10/14/2007   FIBROMYALGIA 07/03/2007   HYPERLIPIDEMIA NEC/NOS 05/16/2007   HEADACHE 05/09/2007    Earlie Counts, PT 03/01/21 2:48 PM  [facility] [address], [place], [place], [address] Phone: [phone]   Fax:  [phone]  Name: Trey Bebee MRN: [id number] Date of Birth: [birth year]

## 2021-03-13 ENCOUNTER — Ambulatory Visit: Payer: 59 | Attending: Obstetrics & Gynecology | Admitting: Physical Therapy

## 2021-03-13 ENCOUNTER — Other Ambulatory Visit: Payer: Self-pay

## 2021-03-13 ENCOUNTER — Encounter: Payer: Self-pay | Admitting: Physical Therapy

## 2021-03-13 DIAGNOSIS — R278 Other lack of coordination: Secondary | ICD-10-CM | POA: Insufficient documentation

## 2021-03-13 DIAGNOSIS — M6281 Muscle weakness (generalized): Secondary | ICD-10-CM

## 2021-03-13 DIAGNOSIS — R32 Unspecified urinary incontinence: Secondary | ICD-10-CM | POA: Insufficient documentation

## 2021-03-13 NOTE — Therapy (Signed)
[facility] [address] [place], [place], [address] Phone: [phone]   Fax:  [phone]  Physical Therapy Treatment  Patient Details  Name: Emily Ward MRN: [id number] Date of Birth: [birth year] Referring Provider (PT): Dr. Hale Bogus   Encounter Date: 03/13/2021   PT End of Session - 03/13/21 1443     Visit Number 7    Date for PT Re-Evaluation 05/24/21    Authorization Type UHC    Authorization - Visit Number 7    Authorization - Number of Visits 60    PT Start Time 1400    PT Stop Time 1497    PT Time Calculation (min) 38 min    Activity Tolerance Patient tolerated treatment well;No increased pain    Behavior During Therapy [facility] for tasks assessed/performed             Past Medical History:  Diagnosis Date   Cervical disc disease 2006   COPD (chronic obstructive pulmonary disease) ([place])    Emphysema lung ([place])    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     Fibromyalgia    sees Dr. 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 Olevia Perches, benign polyp, repeat in 10 yrs    CYSTOSCOPY N/A 09/01/2018   Procedure: CYSTOSCOPY;  Surgeon: Megan Salon, MD;  Location: [facility];  Service: Gynecology;  Laterality: N/A;   DILATATION & CURETTAGE/HYSTEROSCOPY WITH MYOSURE N/A 04/12/2017   Procedure: DILATATION & CURETTAGE/HYSTEROSCOPY;  Surgeon: Megan Salon, MD;  Location: [facility];  Service: Gynecology;  Laterality: N/A;   HYSTEROSCOPY     years ago with Dr Radene Knee-? 2000   TOTAL LAPAROSCOPIC HYSTERECTOMY  WITH SALPINGECTOMY Bilateral 09/01/2018   Procedure: TOTAL LAPAROSCOPIC HYSTERECTOMY WITH SALPINGECTOMY  BSO, REPAIR OF PERINEAL LACERATION;  Surgeon: Megan Salon, MD;  Location: [place];  Service: Gynecology;  Laterality: Bilateral;  possible BSO   URETHRAL DILATION      There were no vitals filed for this visit.   Subjective Assessment - 03/13/21 1403     Subjective I feel better. I am down to 1-2 pads per day. the urge to void is helping. Bladder spasms have reduced. Some days does not have badder spasms at all compared to several times per day. Down to 1-2 pads per day.    Patient Stated Goals reduce leakage    Currently in Pain? No/denies    Multiple Pain Sites No                               [facility] Adult PT Treatment/Exercise - 03/13/21 0001       Balance Poses: Yoga   Warrior I 2 reps;30 seconds   cues to contract the pelvic floor, bring inner thighs together   Warrior II 2 reps;30 seconds   contract the pelvic floor, bring the inner thighs together     Lumbar Exercises: Stretches  Prone on Elbows Stretch 1 rep;10 seconds      Lumbar Exercises: Quadruped   Single Arm Raise Right;Left;10 reps    Single Arm Raise Weights (lbs) not allowing the body to move, towel roll to contract the pelvic floor    Straight Leg Raise 5 reps    Straight Leg Raises Limitations each side with tactile cues to not move the rest of body and was having neck pain      Knee/Hip Exercises: Standing   Functional Squat 1 set;15 reps    Functional Squat Limitations mini squat with pelvic floor contraction, tactile cues to not lean trunk forward    Other Standing Knee Exercises stand with one leg ahead from the other pulling the red band toward the forward leg to contract the obliques and pelvi cfloor 10x each way                    PT Education - 03/13/21 1441     Education Details Access Code: [email or web address]    Person(s) Educated Patient    Methods  Explanation;Demonstration;Verbal cues;Handout    Comprehension Returned demonstration;Verbalized understanding              PT Short Term Goals - 01/18/21 1221       PT SHORT TERM GOAL #1   Title independent with initial HEP with diaphragmatic breathing and stretches    Time 4    Period Weeks    Status Achieved    Target Date 01/04/21      PT SHORT TERM GOAL #2   Title understand on how to perform her own perineal massage to lengthen the pelvic floor muscles    Time 4    Period Weeks    Status Achieved               PT Long Term Goals - 03/13/21 1451       PT LONG TERM GOAL #1   Title independent with advanced HEP for pelvic floor strength    Time 12    Period Weeks    Status On-going      PT LONG TERM GOAL #2   Title urinary leakage decreased so she is wearing </= 1 pad per day    Baseline down to 1-2 thin pads    Time 12    Period Weeks      PT LONG TERM GOAL #3   Title pelvic floor strength is >/= 3/5 and able to fully relax so she has increased coordination of the muscles to prevent urinary leakage.    Time 12    Period Weeks    Status On-going      PT LONG TERM GOAL #4   Title able to cough, laugh, lift without leaking urine due to full contraction of the pelvic floor quickly with full relaxation    Time 12    Period Weeks    Status On-going      PT LONG TERM GOAL #5   Title educated on vaginal health to incorporate moisturizers and lubricants to reduce dryness and friction with intercourse    Time 12    Period Weeks    Status Achieved                   Plan - 03/13/21 1444     Clinical Impression Statement Patient reports she is able to hold her urine better when she does the urge to void. Patient is not having daily bladder spasms  anymore and they have reduced by 70%. She is now using 1-2 pads per day and mostly due to vaginal discharge compared to urinary leakage. Patient will leak with squatting, coughing, and sneezing. Patient  has difficulty with quadruped lift extremity due to the pain in her neck and weakness in the muscles. Patient needed tactile cues to keep chest up with squats. Patient will benefit from skilled therapy to improve pelvic floor strength and coordination.    Personal Factors and Comorbidities Comorbidity 3+;Fitness;Sex    Comorbidities Fibromyalgia; COPD; C-section 1993; Hysterectomy 09/01/2018; Cervical lamenectomy    Examination-Activity Limitations Continence;Lift;Locomotion Level;Carry;Stand;Toileting    Examination-Participation Restrictions Community Activity;Cleaning;Meal Prep;Interpersonal Relationship    Stability/Clinical Decision Making Stable/Uncomplicated    Rehab Potential Excellent    PT Frequency 1x / week    PT Duration 12 weeks    PT Treatment/Interventions ADLs/Self Care Home Management;Cryotherapy;Moist Heat;Therapeutic activities;Therapeutic exercise;Neuromuscular re-education;Manual techniques;Patient/family education;Dry needling    PT Next Visit Plan progress exercises to standing , see how the leakage is , manual work around the umbilicus; fascial work on lower abdomen, internal work    PT Home Exercise Plan Access Code: [email or web address]    Recommended Other Services MD signed all notes    Consulted and Agree with Plan of Care Patient             Patient will benefit from skilled therapeutic intervention in order to improve the following deficits and impairments:  Decreased coordination, Increased fascial restricitons, Pain, Decreased endurance, Increased muscle spasms, Decreased activity tolerance, Decreased strength  Visit Diagnosis: Muscle weakness (generalized)  Other lack of coordination  Urinary incontinence, unspecified type     Problem List Patient Active Problem List   Diagnosis Date Noted   Hyperparathyroidism ([place]) 10/18/2020   Osteoporosis 10/18/2020   Concussion 10/27/2018   Postmenopausal bleeding 09/01/2018   MGUS (monoclonal gammopathy of unknown  significance) 02/13/2018   COPD with emphysema ([place]) 10/04/2017   Palpitations 07/30/2017   Shortness of breath 07/30/2017   Hormone replacement therapy (HRT) 03/30/2017   Former smoker 03/30/2017   Other fatigue 06/30/2016   Insomnia 06/30/2016   DJD (degenerative joint disease), cervical 06/30/2016   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/2016   IBS (irritable bowel syndrome) 06/30/2016   Interstitial cystitis 06/30/2016   SHOULDER PAIN, RIGHT 09/05/2010   GERD 03/24/2009   SACROILIAC STRAIN 03/24/2009   Atypical chest pain 01/20/2008   HEMATURIA UNSPECIFIED 10/14/2007   FIBROMYALGIA 07/03/2007   HYPERLIPIDEMIA NEC/NOS 05/16/2007   HEADACHE 05/09/2007    Earlie Counts, PT 03/13/21 2:53 PM   [facility] [address] [place], [place], [address] Phone: [phone]   Fax:  [phone]  Name: Emily Ward MRN: [id number] Date of Birth: November 24, 1955

## 2021-03-13 NOTE — Patient Instructions (Signed)
Access Code: [email or web address] URL: [email or web address] Date: 03/13/2021 Prepared by: Earlie Counts  Program Notes Exhale with exertion   Exercises Supine Hamstring Stretch with Strap - 1 x daily - 7 x weekly - 1 sets - 2 reps - 30 sec hold Supine ITB Stretch with Strap - 1 x daily - 7 x weekly - 1 sets - 2 reps - 30 sec hold Hip Adductors and Hamstring Stretch with Strap - 1 x daily - 7 x weekly - 1 sets - 2 reps - 30 sec hold Supine Pelvic Floor Stretch - 1 x daily - 7 x weekly - 1 sets - 2 reps - 1 min hold Pigeon Pose - 1 x daily - 7 x weekly - 1 sets - 2 reps - 30 sec hold Supine Transversus Abdominis Bracing with Leg Extension - 1 x daily - 3 x weekly - 1 sets - 10 reps Supine 90/90 Shoulder Flexion with Abdominal Bracing - 1 x daily - 3 x weekly - 1 sets - 10 reps Functional Pelvic Floor Contractions with Cough Simulation - 1 x daily - 7 x weekly - 1 sets - 5 reps Mini Squat with Pelvic Floor Contraction - 1 x daily - 3 x weekly - 1 sets - 10 reps Warrior I - 1 x daily - 3 x weekly - 1 sets - 2 reps - 30 sec hold Warrior II - 1 x daily - 3 x weekly - 1 sets - 2 reps - 30 sec hold Standing Anti-Rotation Press with Anchored Resistance - 1 x daily - 3 x weekly - 1 sets - 10 reps [facility] [address] [place], [place] [address] Phone # [phone] Fax [phone]

## 2021-03-14 ENCOUNTER — Ambulatory Visit: Payer: 59 | Admitting: Rheumatology

## 2021-03-24 ENCOUNTER — Telehealth: Payer: Self-pay | Admitting: Physical Medicine and Rehabilitation

## 2021-03-24 NOTE — Telephone Encounter (Signed)
Patient called needing to schedule ann appointment for her neck. The number to contact patient is [phone]

## 2021-03-30 ENCOUNTER — Other Ambulatory Visit: Payer: Self-pay

## 2021-03-30 ENCOUNTER — Other Ambulatory Visit: Payer: Self-pay | Admitting: Pulmonary Disease

## 2021-03-30 ENCOUNTER — Ambulatory Visit (INDEPENDENT_AMBULATORY_CARE_PROVIDER_SITE_OTHER): Payer: 59 | Admitting: Physical Medicine and Rehabilitation

## 2021-03-30 ENCOUNTER — Ambulatory Visit: Payer: Self-pay

## 2021-03-30 VITALS — BP 113/79 | HR 69

## 2021-03-30 DIAGNOSIS — M47812 Spondylosis without myelopathy or radiculopathy, cervical region: Secondary | ICD-10-CM | POA: Diagnosis not present

## 2021-03-30 DIAGNOSIS — Z981 Arthrodesis status: Secondary | ICD-10-CM | POA: Diagnosis not present

## 2021-03-30 DIAGNOSIS — M797 Fibromyalgia: Secondary | ICD-10-CM

## 2021-03-30 DIAGNOSIS — R202 Paresthesia of skin: Secondary | ICD-10-CM

## 2021-03-30 HISTORY — PX: OTHER SURGICAL HISTORY: SHX169

## 2021-03-30 MED ORDER — BETAMETHASONE SOD PHOS & ACET 6 (3-3) MG/ML IJ SUSP
12.0000 mg | Freq: Once | INTRAMUSCULAR | Status: AC
  Administered 2021-03-30: 12 mg

## 2021-03-30 NOTE — Patient Instructions (Signed)

## 2021-03-30 NOTE — Progress Notes (Signed)
Pt state neck pain that travels to her left hand, making her ring and pinky finger numb. Pt state she uses ice and over the counter pain meds to help ease her pain.  Numeric Pain Rating Scale and Functional Assessment Average Pain 6   In the last MONTH (on 0-10 scale) has pain interfered with the following?  1. General activity like being  able to carry out your everyday physical activities such as walking, climbing stairs, carrying groceries, or moving a chair?  Rating(8)   +Driver, -BT, -Dye Allergies. Pt had

## 2021-04-04 ENCOUNTER — Encounter: Payer: Self-pay | Admitting: Physical Medicine and Rehabilitation

## 2021-04-04 NOTE — Progress Notes (Signed)
Emily Ward - 65 y.o. female MRN [id number]  Date of birth: [birth year]  Office Visit Note: Visit Date: 03/30/2021 PCP: Laurey Morale, MD Referred by: Laurey Morale, MD  Subjective: Chief Complaint  Patient presents with   Neck - Pain   Left Hand - Pain   HPI:  Emily Ward is a 65 y.o. female who comes in today For evaluation and management of chronic worsening severe neck pain with radicular type symptoms in the left arm and left hand.  She is followed by Dr. Cy Blamer and Hazel Sams, PA-C for fibromyalgia and degenerative arthritis as well as osteoporosis.  I saw the patient last year and completed a left C8 6-7 facet joint block which she reports really relieved all of her symptoms at that time for quite a while.  My notes can be fully reviewed.  She has had prior ACDF in 2006 at C5-6.  MRI from 2021 shows adjacent level disease which is mainly arthritic changes and foraminal narrowing on the left at C4-5 and C6-7.  Her symptoms in the hand are more ring finger and fifth digit.  This would be more of a C8 distribution which does not quite fit with any findings on the MRI.  The C6-7 foraminal narrowing typically would cause more of the middle digits to be numb or painful.  Nonetheless not everyone follows a specific dermatome.  She has no right-sided complaints.  No new trauma.  She continues with extensive physical therapy.  She has been undergoing that for some time and continues with that.  She has had medication management which also includes muscle relaxer as well as duloxetine.  She has not had follow-up with a spine surgeon.  She has not had any electrodiagnostic study.  She is right-hand dominant.  Her pain level is 6 out of 10 but does affect her daily living quite a bit.  Her biggest complaint is the neck pain more than the hand tingling which is intermittent.  She has had no other red flag symptoms.  Review of Systems  Musculoskeletal:  Positive for joint  pain and neck pain.  Neurological:  Positive for tingling.  Otherwise per HPI.  Assessment & Plan: Visit Diagnoses:    ICD-10-CM   1. Cervical spondylosis without myelopathy  M47.812 XR C-ARM NO REPORT    Facet Injection    betamethasone acetate-betamethasone sodium phosphate (CELESTONE) injection 12 mg    2. Paresthesia of skin  R20.2     3. S/P cervical spinal fusion  Z98.1     4. Fibromyalgia  M79.7       Plan: Findings:  Chronic severe worsening several months of progressive worsening left more than right neck pain with some pain and paresthesia in the left hand which is intermittent.  She has mainly arthritic changes above and below the fusion at C5-6.  She does have some foraminal narrowing.  She reports almost complete relief with facet joint block last year for several months.  I will go ahead today and repeat that and the amount of pain that she is having.  If she does not get relief with that I think the neck step would be electrodiagnostic study and/or have her follow-up with a spine surgeon.  Electrodiagnostic study to rule out C8 radiculopathy versus ulnar neuropathy at the elbow.  Her case is complicated by fibromyalgia which can also elicit paresthesia.   Meds & Orders:  Meds ordered this encounter  Medications   betamethasone acetate-betamethasone  sodium phosphate (CELESTONE) injection 12 mg    Orders Placed This Encounter  Procedures   Facet Injection   XR C-ARM NO REPORT    Follow-up: Return if symptoms worsen or fail to improve.   Procedures: No procedures performed  Cervical Facet Joint Intra-Articular Injection with Fluoroscopic Guidance  Patient: Emily Ward      Date of Birth: [birth year] MRN: [id number] PCP: Laurey Morale, MD      Visit Date: 03/30/2021   Universal Protocol:    Date/Time: 08/02/226:09 AM  Consent Given By: the patient  Position: PRONE  Additional Comments: Vital signs were monitored before and after the  procedure. Patient was prepped and draped in the usual sterile fashion. The correct patient, procedure, and site was verified.   Injection Procedure Details:  Procedure Site One Meds Administered:  Meds ordered this encounter  Medications   betamethasone acetate-betamethasone sodium phosphate (CELESTONE) injection 12 mg     Laterality: Left  Location/Site:  C6-7  Needle size: 25 G  Needle type: Spinal  Needle Placement: Articular  Findings:  -Contrast Used: 0.5 mL iohexol 180 mg iodine/mL   -Comments: Excellent flow of contrast producing a partial arthrogram.  Procedure Details: The region overlying the facet joints mentioned above were localized under fluoroscopic visualization. The needle was inserted down to the level of the lateral mass of the superior articular process of the facet joint to be injected. Then, the needle was "walked off" inferiorly into the lateral aspect of the facet joint. Bi-planar images were used for confirming placement and spot radiographs were documented.  A 0.25 ml volume of Omnipaque-240 was injected into the facet joint and a standard partial arthrogram was obtained. Radiographs were obtained of the arthrogram. A 0.5 ml. volume of the steroid/anesthetic solution was injected into the joint. This procedure was repeated for each facet joint injected.   Additional Comments:  The patient tolerated the procedure well Dressing: Band-Aid    Post-procedure details: Patient was observed during the procedure. Post-procedure instructions were reviewed.  Patient left the clinic in stable condition.    Clinical History: MRI CERVICAL SPINE WITHOUT CONTRAST  TECHNIQUE: Multiplanar, multisequence MR imaging of the cervical spine was performed. No intravenous contrast was administered.  COMPARISON: 10/22/2018 cervical spine CT  FINDINGS: Alignment: Degenerative reversal of cervical lordosis with mild C7-T1 anterolisthesis.  Vertebrae: C5-6 ACDF  with solid arthrodesis. No fracture, discitis, or visible degenerative inflammation.  Cord: Normal signal and morphology.  Posterior Fossa, vertebral arteries, paraspinal tissues: Negative  Disc levels:  C2-3: Unremarkable.  C3-4: Unremarkable.  C4-5: Disc narrowing with endplate ridging eccentric to the left where there uncovertebral spurring and moderate foraminal narrowing. Patent canal  C5-6: ACDF with solid arthrodesis and no detected impingement.  C6-7: Prominent leftward uncal vertebral ridging. Asymmetric left facet spurring. Left foraminal impingement is advanced.  C7-T1:Facet osteoarthritis with asymmetric left-sided spurring and mild anterolisthesis. Mild left foraminal narrowing based on axial slices. Patent canal  IMPRESSION: 1. Adjacent segment degeneration at C4-5 and C6-7 with left foraminal impingement at both levels, greater at C6-7. 2. C7-T1 facet osteoarthritis with asymmetric left spurring and mild anterolisthesis. 3. C5-6 ACDF with solid arthrodesis.   Electronically Signed By: Monte Fantasia M.D. On: 05/04/2020 11:27     Objective:  VS:  HT:    WT:   BMI:     BP:113/79  HR:69bpm  TEMP: ( )  RESP:  Physical Exam Vitals and nursing note reviewed.  Constitutional:      General: She is  not in acute distress.    Appearance: Normal appearance. She is not ill-appearing.  HENT:     Head: Normocephalic and atraumatic.     Right Ear: External ear normal.     Left Ear: External ear normal.  Eyes:     Extraocular Movements: Extraocular movements intact.  Cardiovascular:     Rate and Rhythm: Normal rate.     Pulses: Normal pulses.  Musculoskeletal:     Cervical back: Tenderness present. No rigidity.     Right lower leg: No edema.     Left lower leg: No edema.     Comments: Patient has good strength in the upper extremities including 5 out of 5 strength in wrist extension long finger flexion and APB.  There is no atrophy of the hands  intrinsically.  There is a negative Hoffmann's test.   Lymphadenopathy:     Cervical: No cervical adenopathy.  Skin:    Findings: No erythema, lesion or rash.  Neurological:     General: No focal deficit present.     Mental Status: She is alert and oriented to person, place, and time.     Sensory: No sensory deficit.     Motor: No weakness or abnormal muscle tone.     Coordination: Coordination normal.  Psychiatric:        Mood and Affect: Mood normal.        Behavior: Behavior normal.     Imaging: No results found.

## 2021-04-04 NOTE — Procedures (Signed)
Cervical Facet Joint Intra-Articular Injection with Fluoroscopic Guidance  Patient: Emily Ward      Date of Birth: 02-Jul-1956 MRN: [id number] PCP: Laurey Morale, MD      Visit Date: 03/30/2021   Universal Protocol:    Date/Time: 08/02/226:09 AM  Consent Given By: the patient  Position: PRONE  Additional Comments: Vital signs were monitored before and after the procedure. Patient was prepped and draped in the usual sterile fashion. The correct patient, procedure, and site was verified.   Injection Procedure Details:  Procedure Site One Meds Administered:  Meds ordered this encounter  Medications   betamethasone acetate-betamethasone sodium phosphate (CELESTONE) injection 12 mg     Laterality: Left  Location/Site:  C6-7  Needle size: 25 G  Needle type: Spinal  Needle Placement: Articular  Findings:  -Contrast Used: 0.5 mL iohexol 180 mg iodine/mL   -Comments: Excellent flow of contrast producing a partial arthrogram.  Procedure Details: The region overlying the facet joints mentioned above were localized under fluoroscopic visualization. The needle was inserted down to the level of the lateral mass of the superior articular process of the facet joint to be injected. Then, the needle was "walked off" inferiorly into the lateral aspect of the facet joint. Bi-planar images were used for confirming placement and spot radiographs were documented.  A 0.25 ml volume of Omnipaque-240 was injected into the facet joint and a standard partial arthrogram was obtained. Radiographs were obtained of the arthrogram. A 0.5 ml. volume of the steroid/anesthetic solution was injected into the joint. This procedure was repeated for each facet joint injected.   Additional Comments:  The patient tolerated the procedure well Dressing: Band-Aid    Post-procedure details: Patient was observed during the procedure. Post-procedure instructions were reviewed.  Patient left the  clinic in stable condition.

## 2021-04-06 ENCOUNTER — Ambulatory Visit: Payer: 59 | Admitting: Physical Medicine and Rehabilitation

## 2021-04-10 ENCOUNTER — Other Ambulatory Visit: Payer: Self-pay

## 2021-04-10 ENCOUNTER — Ambulatory Visit: Payer: 59 | Attending: Obstetrics & Gynecology | Admitting: Physical Therapy

## 2021-04-10 ENCOUNTER — Encounter: Payer: Self-pay | Admitting: Physical Therapy

## 2021-04-10 DIAGNOSIS — R32 Unspecified urinary incontinence: Secondary | ICD-10-CM | POA: Diagnosis present

## 2021-04-10 DIAGNOSIS — M6281 Muscle weakness (generalized): Secondary | ICD-10-CM | POA: Insufficient documentation

## 2021-04-10 DIAGNOSIS — R278 Other lack of coordination: Secondary | ICD-10-CM | POA: Diagnosis present

## 2021-04-10 NOTE — Patient Instructions (Signed)
Access Code: [email or web address] URL: [email or web address] Date: 04/10/2021 Prepared by: Earlie Counts  Program Notes Exhale with exertion   Exercises Supine Hamstring Stretch with Strap - 1 x daily - 7 x weekly - 1 sets - 2 reps - 30 sec hold Supine ITB Stretch with Strap - 1 x daily - 7 x weekly - 1 sets - 2 reps - 30 sec hold Hip Adductors and Hamstring Stretch with Strap - 1 x daily - 7 x weekly - 1 sets - 2 reps - 30 sec hold Supine Pelvic Floor Stretch - 1 x daily - 7 x weekly - 1 sets - 2 reps - 1 min hold Pigeon Pose - 1 x daily - 7 x weekly - 1 sets - 2 reps - 30 sec hold Supine Transversus Abdominis Bracing with Leg Extension - 1 x daily - 3 x weekly - 1 sets - 10 reps Supine 90/90 Shoulder Flexion with Abdominal Bracing - 1 x daily - 3 x weekly - 1 sets - 10 reps Functional Pelvic Floor Contractions with Cough Simulation - 1 x daily - 7 x weekly - 1 sets - 5 reps Mini Squat with Pelvic Floor Contraction - 1 x daily - 3 x weekly - 1 sets - 10 reps Warrior I - 1 x daily - 3 x weekly - 1 sets - 2 reps - 30 sec hold Warrior II - 1 x daily - 3 x weekly - 1 sets - 2 reps - 30 sec hold Standing Anti-Rotation Press with Anchored Resistance - 1 x daily - 3 x weekly - 1 sets - 10 reps Seated Cough with Pelvic Floor Contraction and Hand to Mouth - 1 x daily - 7 x weekly - 3 sets - 10 reps Standing [employer] at [employer] - 1 x daily - 7 x weekly - 2 sets - 10 reps Forward T Arms Overhead with Band - 1 x daily - 7 x weekly - 1 sets - 10 reps  URL: [email or web address] Date: 04/10/2021 Prepared by: Earlie Counts  Program Notes Exhale with exertion   Exercises Supine Hamstring Stretch with Strap - 1 x daily - 7 x weekly - 1 sets - 2 reps - 30 sec hold Supine ITB Stretch with Strap - 1 x daily - 7 x weekly - 1 sets - 2 reps - 30 sec hold Hip Adductors and Hamstring Stretch with Strap - 1 x daily - 7 x weekly - 1 sets - 2 reps - 30 sec hold Supine Pelvic Floor  Stretch - 1 x daily - 7 x weekly - 1 sets - 2 reps - 1 min hold Pigeon Pose - 1 x daily - 7 x weekly - 1 sets - 2 reps - 30 sec hold Supine Transversus Abdominis Bracing with Leg Extension - 1 x daily - 3 x weekly - 1 sets - 10 reps Supine 90/90 Shoulder Flexion with Abdominal Bracing - 1 x daily - 3 x weekly - 1 sets - 10 reps Functional Pelvic Floor Contractions with Cough Simulation - 1 x daily - 7 x weekly - 1 sets - 5 reps Mini Squat with Pelvic Floor Contraction - 1 x daily - 3 x weekly - 1 sets - 10 reps Warrior I - 1 x daily - 3 x weekly - 1 sets - 2 reps - 30 sec hold Warrior II - 1 x daily - 3 x weekly - 1 sets - 2 reps - 30 sec hold Standing Anti-Rotation Press with Anchored Resistance -  1 x daily - 3 x weekly - 1 sets - 10 reps Seated Cough with Pelvic Floor Contraction and Hand to Mouth - 1 x daily - 7 x weekly - 3 sets - 10 reps Standing [place] 1 x daily - 7 x weekly - 2 sets - 10 reps Forward T Arms Overhead with Band - 1 x daily - 7 x weekly - 1 sets - 10 reps [facility] [address], [place], [place] [address] Phone # [phone] Fax [phone]

## 2021-04-10 NOTE — Therapy (Signed)
[facility] [address], [place], [place], [address] Phone: [phone]   Fax:  [phone]  Physical Therapy Treatment  Patient Details  Name: Emily Ward MRN: [id number] Date of Birth: 02-24-1956 Referring Provider (PT): Dr. Hale Bogus   Encounter Date: 04/10/2021   PT End of Session - 04/10/21 1310     Visit Number 8    Date for PT Re-Evaluation 05/24/21    Authorization Type UHC    Authorization - Visit Number 8    Authorization - Number of Visits 60    PT Start Time [id number]    PT Stop Time 1308    PT Time Calculation (min) 38 min    Activity Tolerance Patient tolerated treatment well;No increased pain    Behavior During Therapy [address] for tasks assessed/performed             Past Medical History:  Diagnosis Date   Cervical disc disease 2006   COPD (chronic obstructive pulmonary disease) ([place])    Emphysema lung ([place])    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     Fibromyalgia    sees Dr. 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 Olevia Perches, benign polyp, repeat in 10 yrs    CYSTOSCOPY N/A 09/01/2018   Procedure: CYSTOSCOPY;  Surgeon: Megan Salon, MD;  Location: [facility];  Service: Gynecology;  Laterality: N/A;   DILATATION & CURETTAGE/HYSTEROSCOPY WITH MYOSURE N/A 04/12/2017   Procedure: DILATATION & CURETTAGE/HYSTEROSCOPY;  Surgeon: Megan Salon, MD;  Location: [facility];  Service: Gynecology;  Laterality: N/A;   HYSTEROSCOPY     years ago with Dr Radene Knee-? 2000   TOTAL LAPAROSCOPIC HYSTERECTOMY  WITH SALPINGECTOMY Bilateral 09/01/2018   Procedure: TOTAL LAPAROSCOPIC HYSTERECTOMY WITH SALPINGECTOMY  BSO, REPAIR OF PERINEAL LACERATION;  Surgeon: Megan Salon, MD;  Location: [facility];  Service: Gynecology;  Laterality: Bilateral;  possible BSO   URETHRAL DILATION      There were no vitals filed for this visit.   Subjective Assessment - 04/10/21 1235     Subjective I am doing pretty good. I had a facet joint block that has helped. I have been doing my exercises. I want you to check on my squats. Urinary leakage is 80% better.    Patient Stated Goals reduce leakage    Currently in Pain? No/denies                               [facility] Adult PT Treatment/Exercise - 04/10/21 0001       Lumbar Exercises: Standing   Other Standing Lumbar Exercises lean forward with opposite arm and leg keeping balance and pelvic floor contraction 10x each side    Other Standing Lumbar Exercises walking and coughing      Knee/Hip Exercises: Standing   Functional Squat 1 set;15 reps    Functional Squat Limitations mini squat with pelvic floor contraction, tactile cues to  not lean trunk forward    Other Standing Knee Exercises stand with one leg ahead from the other pulling the red band toward the forward leg to contract the obliques and pelvi cfloor 10x each way    Other Standing Knee Exercises bird dog in standing facing the wall with pelvic contraction                    PT Education - 04/10/21 1308     Education Details Access Code: [email or web address]    Person(s) Educated Patient    Methods Explanation;Demonstration;Verbal cues;Handout    Comprehension Returned demonstration;Verbalized understanding              PT Short Term Goals - 01/18/21 1221       PT SHORT TERM GOAL #1   Title independent with initial HEP with diaphragmatic breathing and stretches    Time 4    Period Weeks    Status Achieved    Target Date 01/04/21      PT SHORT TERM  GOAL #2   Title understand on how to perform her own perineal massage to lengthen the pelvic floor muscles    Time 4    Period Weeks    Status Achieved               PT Long Term Goals - 04/10/21 1315       PT LONG TERM GOAL #1   Title independent with advanced HEP for pelvic floor strength    Time 12    Period Weeks    Status On-going      PT LONG TERM GOAL #2   Title urinary leakage decreased so she is wearing </= 1 pad per day    Baseline down to 1 thin pads    Time 12    Period Weeks    Status On-going      PT LONG TERM GOAL #3   Title pelvic floor strength is >/= 3/5 and able to fully relax so she has increased coordination of the muscles to prevent urinary leakage.    Time 12    Period Weeks    Status On-going      PT LONG TERM GOAL #4   Title able to cough, laugh, lift without leaking urine due to full contraction of the pelvic floor quickly with full relaxation    Time 12    Period Weeks    Status On-going      PT LONG TERM GOAL #5   Title educated on vaginal health to incorporate moisturizers and lubricants to reduce dryness and friction with intercourse    Time 12    Period Weeks    Status Achieved                   Plan - 04/10/21 1240     Clinical Impression Statement She is wearing 1 pad per day now. Patient reports her urinary leakage is 75% better. She is not leaking with squats. She will leak with walking and coughing at the same time. Patient is doing well wtih her HEP. She does better with lifting opposite extremity in standing and puts less pressure on her neck. Patient will benefit from skilled therapy to improve pelvic floor strength and coordination.    Personal Factors and Comorbidities Comorbidity 3+;Fitness;Sex    Comorbidities Fibromyalgia; COPD; C-section 1993; Hysterectomy 09/01/2018; Cervical lamenectomy    Examination-Activity Limitations Continence;Lift;Locomotion Level;Carry;Stand;Toileting    Examination-Participation  Restrictions Community Activity;Cleaning;Meal Prep;Interpersonal Relationship  Stability/Clinical Decision Making Stable/Uncomplicated    Rehab Potential Excellent    PT Frequency 1x / week    PT Duration 12 weeks    PT Treatment/Interventions ADLs/Self Care Home Management;Cryotherapy;Moist Heat;Therapeutic activities;Therapeutic exercise;Neuromuscular re-education;Manual techniques;Patient/family education;Dry needling    PT Next Visit Plan progress her HEP; possible discharge    PT Home Exercise Plan Access Code: [email or web address]    Consulted and Agree with Plan of Care Patient             Patient will benefit from skilled therapeutic intervention in order to improve the following deficits and impairments:  Decreased coordination, Increased fascial restricitons, Pain, Decreased endurance, Increased muscle spasms, Decreased activity tolerance, Decreased strength  Visit Diagnosis: Muscle weakness (generalized)  Other lack of coordination  Urinary incontinence, unspecified type     Problem List Patient Active Problem List   Diagnosis Date Noted   Hyperparathyroidism ([place]) 10/18/2020   Osteoporosis 10/18/2020   Concussion 10/27/2018   Postmenopausal bleeding 09/01/2018   MGUS (monoclonal gammopathy of unknown significance) 02/13/2018   COPD with emphysema ([place]) 10/04/2017   Palpitations 07/30/2017   Shortness of breath 07/30/2017   Hormone replacement therapy (HRT) 03/30/2017   Former smoker 03/30/2017   Other fatigue 06/30/2016   Insomnia 06/30/2016   DJD (degenerative joint disease), cervical 06/30/2016   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/2016   IBS (irritable bowel syndrome) 06/30/2016   Interstitial cystitis 06/30/2016   SHOULDER PAIN, RIGHT 09/05/2010   GERD 03/24/2009   SACROILIAC STRAIN 03/24/2009   Atypical chest pain 01/20/2008   HEMATURIA UNSPECIFIED 10/14/2007   FIBROMYALGIA 07/03/2007   HYPERLIPIDEMIA NEC/NOS 05/16/2007    HEADACHE 05/09/2007    Earlie Counts, PT 04/10/21 1:18 PM  [facility] [address], [place], [place], [address] Phone: [phone]   Fax:  [phone]  Name: Emily Ward MRN: [id number] Date of Birth: Jul 29, 1956

## 2021-04-14 ENCOUNTER — Other Ambulatory Visit: Payer: Self-pay | Admitting: Rheumatology

## 2021-04-14 NOTE — Telephone Encounter (Signed)
Next Visit: 08/01/2021  Last Visit: 01/26/2021  Last Fill: 01/18/2021  Dx:   Fibromyalgia  Current Dose per office note on 01/26/2021: Cymbalta 30 mg 2 capsules by mouth daily.   Okay to refill Cymbalta?

## 2021-04-17 ENCOUNTER — Other Ambulatory Visit: Payer: Self-pay | Admitting: Pulmonary Disease

## 2021-04-19 ENCOUNTER — Other Ambulatory Visit: Payer: Self-pay | Admitting: Pulmonary Disease

## 2021-04-19 ENCOUNTER — Encounter: Payer: Self-pay | Admitting: Physical Therapy

## 2021-04-19 ENCOUNTER — Other Ambulatory Visit: Payer: Self-pay

## 2021-04-19 ENCOUNTER — Ambulatory Visit: Payer: 59 | Admitting: Physical Therapy

## 2021-04-19 DIAGNOSIS — R278 Other lack of coordination: Secondary | ICD-10-CM

## 2021-04-19 DIAGNOSIS — M6281 Muscle weakness (generalized): Secondary | ICD-10-CM

## 2021-04-19 DIAGNOSIS — R32 Unspecified urinary incontinence: Secondary | ICD-10-CM

## 2021-04-19 NOTE — Therapy (Signed)
[facility] [address], [place], [place], [address] Phone: [phone]   Fax:  [phone]  Physical Therapy Treatment  Patient Details  Name: Emily Ward MRN: [id number] Date of Birth: Oct 30, 1955 Referring Provider (PT): Dr. Hale Bogus   Encounter Date: 04/19/2021   PT End of Session - 04/19/21 1316     Visit Number 9    Date for PT Re-Evaluation 05/24/21    Authorization Type UHC    Authorization - Visit Number 9    Authorization - Number of Visits 60    PT Start Time [id number]    PT Stop Time 1310    PT Time Calculation (min) 40 min    Activity Tolerance Patient tolerated treatment well;No increased pain    Behavior During Therapy [facility] for tasks assessed/performed             Past Medical History:  Diagnosis Date   Cervical disc disease 2006   COPD (chronic obstructive pulmonary disease) ([place])    Emphysema lung ([place])    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     Fibromyalgia    sees Dr. 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 Olevia Perches, benign polyp, repeat in 10 yrs    CYSTOSCOPY N/A 09/01/2018   Procedure: CYSTOSCOPY;  Surgeon: Megan Salon, MD;  Location: [facility];  Service: Gynecology;  Laterality: N/A;   DILATATION & CURETTAGE/HYSTEROSCOPY WITH MYOSURE N/A 04/12/2017   Procedure: DILATATION & CURETTAGE/HYSTEROSCOPY;  Surgeon: Megan Salon, MD;  Location: [facility];  Service: Gynecology;  Laterality: N/A;   HYSTEROSCOPY     years ago with Dr Radene Knee-? 2000   TOTAL LAPAROSCOPIC HYSTERECTOMY  WITH SALPINGECTOMY Bilateral 09/01/2018   Procedure: TOTAL LAPAROSCOPIC HYSTERECTOMY WITH SALPINGECTOMY  BSO, REPAIR OF PERINEAL LACERATION;  Surgeon: Megan Salon, MD;  Location: [place];  Service: Gynecology;  Laterality: Bilateral;  possible BSO   URETHRAL DILATION      There were no vitals filed for this visit.   Subjective Assessment - 04/19/21 1234     Subjective I am doing better. Most days I am not having any leakage except for 1-2 times. When colon gets full, I feel like I have muscle spasms.    Patient Stated Goals reduce leakage    Currently in Pain? No/denies    Multiple Pain Sites No                               [facility] Adult PT Treatment/Exercise - 04/19/21 0001       Exercises   Exercises Other Exercises    Other Exercises  laying on side to do the open book stretch and therapist assist with the breath, rib expansion and holding the pelvis down      Manual Therapy   Manual Therapy Soft tissue mobilization;Myofascial release    Soft tissue mobilization bilateral diaphgram, along the obliques, and transverse abdominus; along  the quadratus    Myofascial Release using the suction cup to the abdominal area to release the fascia followed by tissue rolling in the abdominal and lower rib cage in supine and sidely                      PT Short Term Goals - 01/18/21 1221       PT SHORT TERM GOAL #1   Title independent with initial HEP with diaphragmatic breathing and stretches    Time 4    Period Weeks    Status Achieved    Target Date 01/04/21      PT SHORT TERM GOAL #2   Title understand on how to perform her own perineal massage to lengthen the pelvic floor muscles    Time 4    Period Weeks    Status Achieved               PT Long Term Goals - 04/19/21 1320       PT LONG TERM GOAL #1   Title independent with advanced HEP for pelvic floor strength    Time 12    Period Weeks    Status On-going       PT LONG TERM GOAL #2   Title urinary leakage decreased so she is wearing </= 1 pad per day    Baseline down to 1 thin pads    Time 12    Period Weeks    Status On-going      PT LONG TERM GOAL #3   Title pelvic floor strength is >/= 3/5 and able to fully relax so she has increased coordination of the muscles to prevent urinary leakage.    Time 12    Period Weeks    Status On-going      PT LONG TERM GOAL #4   Title able to cough, laugh, lift without leaking urine due to full contraction of the pelvic floor quickly with full relaxation    Time 12    Period Weeks    Status On-going                   Plan - 04/19/21 1317     Clinical Impression Statement Patient continues to have less urinary leakage. Patient has tight abdominal fascial and muscles restricting from the lower rib cage from opening up and the movement of the diaphragm. After therapy patient was able to breath into her diaphragm and contract her pelvic floor better. Patient will benefit from skilled therapy to improve pelvic floor strength and coordination.    Personal Factors and Comorbidities Comorbidity 3+;Fitness;Sex    Comorbidities Fibromyalgia; COPD; C-section 1993; Hysterectomy 09/01/2018; Cervical lamenectomy    Examination-Activity Limitations Continence;Lift;Locomotion Level;Carry;Stand;Toileting    Examination-Participation Restrictions Community Activity;Cleaning;Meal Prep;Interpersonal Relationship    Stability/Clinical Decision Making Stable/Uncomplicated    Rehab Potential Excellent    PT Frequency 1x / week    PT Duration 12 weeks    PT Treatment/Interventions ADLs/Self Care Home Management;Cryotherapy;Moist Heat;Therapeutic activities;Therapeutic exercise;Neuromuscular re-education;Manual techniques;Patient/family education;Dry needling    PT Next Visit Plan work on abdominals, core strength and pelvic floor contraction    PT Home Exercise Plan Access Code: [email or web address]    Consulted and Agree with  Plan of Care Patient             Patient will benefit from skilled therapeutic intervention in order to improve the following deficits and impairments:  Decreased coordination, Increased fascial restricitons, Pain, Decreased endurance, Increased  muscle spasms, Decreased activity tolerance, Decreased strength  Visit Diagnosis: Muscle weakness (generalized)  Other lack of coordination  Urinary incontinence, unspecified type     Problem List Patient Active Problem List   Diagnosis Date Noted   Hyperparathyroidism ([place]) 10/18/2020   Osteoporosis 10/18/2020   Concussion 10/27/2018   Postmenopausal bleeding 09/01/2018   MGUS (monoclonal gammopathy of unknown significance) 02/13/2018   COPD with emphysema ([place]) 10/04/2017   Palpitations 07/30/2017   Shortness of breath 07/30/2017   Hormone replacement therapy (HRT) 03/30/2017   Former smoker 03/30/2017   Other fatigue 06/30/2016   Insomnia 06/30/2016   DJD (degenerative joint disease), cervical 06/30/2016   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/2016   IBS (irritable bowel syndrome) 06/30/2016   Interstitial cystitis 06/30/2016   SHOULDER PAIN, RIGHT 09/05/2010   GERD 03/24/2009   SACROILIAC STRAIN 03/24/2009   Atypical chest pain 01/20/2008   HEMATURIA UNSPECIFIED 10/14/2007   FIBROMYALGIA 07/03/2007   HYPERLIPIDEMIA NEC/NOS 05/16/2007   HEADACHE 05/09/2007    Earlie Counts, PT 04/19/21 1:22 PM  [facility] [address], [place], [place], [address] Phone: [phone]   Fax:  [phone]  Name: Emily Ward MRN: [id number] Date of Birth: 12/21/1955

## 2021-04-21 ENCOUNTER — Telehealth: Payer: Self-pay | Admitting: Pulmonary Disease

## 2021-04-21 MED ORDER — ANORO ELLIPTA 62.5-25 MCG/INH IN AEPB: 1.0000 | INHALATION_SPRAY | Freq: Every day | RESPIRATORY_TRACT | 0 refills | Status: DC

## 2021-04-21 NOTE — Telephone Encounter (Signed)
Pt stated that her rx for Anono inhaler was denied by her pharmacy and she stated that she was not sure as to why did inform pt that she was overdue for a 1 yr follow up appoint which we have scheduled for 06/01/2021 with Dr. Vaughan Browner however she will be out of the medication before then and was requesting a refill or possible sample.  Pharmacy; [id number] - [place], [place] [address]; [phone]

## 2021-04-21 NOTE — Telephone Encounter (Signed)
Call made to patient, confirmed DOB. Made aware refill has been sent in to hold her until appt however no more refills will be sent until she is able to make an in office visit. Voiced understanding.   Nothing further needed at this time.

## 2021-05-17 ENCOUNTER — Ambulatory Visit: Payer: 59 | Admitting: Physical Therapy

## 2021-05-26 ENCOUNTER — Other Ambulatory Visit: Payer: Self-pay

## 2021-05-26 ENCOUNTER — Ambulatory Visit: Payer: 59 | Attending: Obstetrics & Gynecology | Admitting: Physical Therapy

## 2021-05-26 ENCOUNTER — Encounter: Payer: Self-pay | Admitting: Physical Therapy

## 2021-05-26 DIAGNOSIS — R278 Other lack of coordination: Secondary | ICD-10-CM | POA: Insufficient documentation

## 2021-05-26 DIAGNOSIS — M6281 Muscle weakness (generalized): Secondary | ICD-10-CM | POA: Diagnosis not present

## 2021-05-26 DIAGNOSIS — R32 Unspecified urinary incontinence: Secondary | ICD-10-CM | POA: Diagnosis present

## 2021-05-26 NOTE — Therapy (Signed)
[facility] [address] [place], [place], [address] Phone: [phone]   Fax:  [phone]  Physical Therapy Treatment  Patient Details  Name: Emily Ward MRN: [id number] Date of Birth: Feb 07, 1956 Referring Provider (PT): Dr. Hale Bogus   Encounter Date: 05/26/2021   PT End of Session - 05/26/21 1107     Visit Number 10    Date for PT Re-Evaluation 07/21/21    Authorization Type UHC    Authorization - Visit Number 10    Authorization - Number of Visits 60    PT Start Time 4709    PT Stop Time 1100    PT Time Calculation (min) 45 min    Activity Tolerance Patient tolerated treatment well;No increased pain    Behavior During Therapy [facility] for tasks assessed/performed             Past Medical History:  Diagnosis Date   Cervical disc disease 2006   COPD (chronic obstructive pulmonary disease) ([place])    Emphysema lung ([place])    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     Fibromyalgia    sees Dr. 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 Olevia Perches, benign polyp, repeat in 10 yrs    CYSTOSCOPY N/A 09/01/2018   Procedure: CYSTOSCOPY;  Surgeon: Megan Salon, MD;  Location: [facility];  Service: Gynecology;  Laterality: N/A;   DILATATION & CURETTAGE/HYSTEROSCOPY WITH MYOSURE N/A 04/12/2017   Procedure: DILATATION & CURETTAGE/HYSTEROSCOPY;  Surgeon: Megan Salon, MD;  Location: [facility];  Service: Gynecology;  Laterality: N/A;   HYSTEROSCOPY     years ago with Dr Radene Knee-? 2000   TOTAL LAPAROSCOPIC HYSTERECTOMY  WITH SALPINGECTOMY Bilateral 09/01/2018   Procedure: TOTAL LAPAROSCOPIC HYSTERECTOMY WITH SALPINGECTOMY  BSO, REPAIR OF PERINEAL LACERATION;  Surgeon: Megan Salon, MD;  Location: [place];  Service: Gynecology;  Laterality: Bilateral;  possible BSO   URETHRAL DILATION      There were no vitals filed for this visit.   Subjective Assessment - 05/26/21 1019     Subjective I had COVID and in bed for 4 days. I had to cough very hard and I would leak. I feel like I have hurt a muscle in the left lower quadrant. Ihave not had leakage  since COVID. I have trouble doing the exercises due to the pain.    Patient Stated Goals reduce leakage    Currently in Pain? Yes    Pain Score 8     Pain Location Abdomen    Pain Orientation Left;Lower    Pain Descriptors / Indicators Burning;Sharp    Pain Type Acute pain    Pain Onset More than a month ago    Pain Frequency Intermittent    Aggravating Factors  twisting to the right, sit to stand, bracing the abdominals, sit to lying and lying to sitting.    Pain Relieving Factors being still    Multiple Pain Sites No  [facility] PT Assessment - 05/26/21 0001       Assessment   Medical Diagnosis R32 urinary incontinence in female    Referring Provider (PT) Dr. 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    Leisure walking, would like to get into yoga      Cognition   Overall Cognitive Status Within Functional Limits for tasks assessed      Strength   Right Hip Flexion 4+/5    Right Hip Extension 5/5    Right Hip External Rotation  5/5    Right  Hip Internal Rotation 4/5    Left Hip Flexion 4+/5    Left Hip External Rotation 5/5    Left Hip Internal Rotation 4/5    Left Hip ABduction 4/5    Left Hip ADduction 4+/5      Palpation   SI assessment  left ilum rotated posteriorly                        Pelvic Floor Special Questions - 05/26/21 0001     Urinary Leakage No    Pad use 1 pad just in case    Urinary frequency on occasion has trouble emptying her bladder when she is in a hurry               [facility] Adult PT Treatment/Exercise - 05/26/21 0001       Self-Care   Self-Care Other Self-Care Comments    Other Self-Care Comments  educated patient on how to manual mobilize her lower abdominals where she has trigger points and fascial tightness      Lumbar Exercises: Stretches   Piriformis Stretch Right;Left;1 rep;30 seconds    Piriformis Stretch Limitations pigeon pose      Manual Therapy   Manual Therapy Soft tissue mobilization;Myofascial release    Soft tissue mobilization manual work of the diaphragm, obliques and rectus femoris    Myofascial Release tissue rolling of the abdomen to release the fascia and using breath to release; fascial release of the mesenteric root; release arounf the umbilicus; release of the pubovesical ligaments;                       PT Short Term Goals - 01/18/21 1221       PT SHORT TERM GOAL #1   Title independent with initial HEP with diaphragmatic breathing and stretches    Time 4    Period Weeks    Status Achieved    Target Date 01/04/21      PT SHORT TERM GOAL #2   Title understand on how to perform her own perineal massage to lengthen the pelvic floor muscles    Time 4    Period Weeks    Status Achieved               PT Long Term Goals - 05/26/21 1113       PT LONG TERM GOAL #1   Title independent with advanced HEP for  pelvic floor strength    Baseline not able to due to her left lower quadrant pain    Time 12    Period Weeks     Status On-going      PT LONG TERM GOAL #2   Title urinary leakage decreased so she is wearing </= 1 pad per day    Baseline wears 1 liner for just    Time 12    Period Weeks    Status On-going      PT LONG TERM GOAL #3   Title pelvic floor strength is >/= 3/5 and able to fully relax so she has increased coordination of the muscles to prevent urinary leakage.    Baseline not having urinary leakage    Time 12    Period Weeks    Status Achieved      PT LONG TERM GOAL #4   Title able to cough, laugh, lift without leaking urine due to full contraction of the pelvic floor quickly with full relaxation    Time 12    Period Weeks    Status Achieved      PT LONG TERM GOAL #5   Title educated on vaginal health to incorporate moisturizers and lubricants to reduce dryness and friction with intercourse    Time 12    Period Weeks    Status Achieved                   Plan - 05/26/21 1108     Clinical Impression Statement Patient had COVID and has recovered except for left lower abdominal pain at level 8/10 when she contracts the muscles making it difficult for her to do her exercises. Patient had urinary leakage with coughing during COVID but now is not. Patient has weakness in the hip flexors and hip abductors. Patient has palpable tenderness located in the left lower quadrant. She has fascial tightness in the umbilicus, mid abdominals, from the umbilicus to the pubic bone and left lower quadrant. Patient will need skilled therapy to reduce the trigger poins and fascial restrictions she has from when she had COVID to do her exercises to continue to not leak urine.    Personal Factors and Comorbidities Comorbidity 3+;Fitness;Sex    Comorbidities Fibromyalgia; COPD; C-section 1993; Hysterectomy 09/01/2018; Cervical lamenectomy    Examination-Activity Limitations Continence;Lift;Locomotion Level;Carry;Stand;Toileting    Examination-Participation Restrictions Community  Activity;Cleaning;Meal Prep;Interpersonal Relationship    Stability/Clinical Decision Making Stable/Uncomplicated    Rehab Potential Excellent    PT Frequency 1x / week    PT Duration 12 weeks    PT Treatment/Interventions ADLs/Self Care Home Management;Cryotherapy;Moist Heat;Therapeutic activities;Therapeutic exercise;Neuromuscular re-education;Manual techniques;Patient/family education;Dry needling    PT Next Visit Plan work on abdominals abdominal tissue to release trigger points, review HEP to see what she can do    PT Home Exercise Plan Access Code: [email or web address]    Consulted and Agree with Plan of Care Patient             Patient will benefit from skilled therapeutic intervention in order to improve the following deficits and impairments:  Decreased coordination, Increased fascial restricitons, Pain, Decreased endurance, Increased muscle spasms, Decreased activity tolerance, Decreased strength  Visit Diagnosis: Muscle weakness (generalized) - Plan: PT plan of care cert/re-cert  Other lack of coordination - Plan: PT plan of care cert/re-cert  Urinary incontinence, unspecified type - Plan: PT plan of care cert/re-cert     Problem List Patient Active Problem List   Diagnosis Date Noted   Hyperparathyroidism (  [place]) 10/18/2020   Osteoporosis 10/18/2020   Concussion 10/27/2018   Postmenopausal bleeding 09/01/2018   MGUS (monoclonal gammopathy of unknown significance) 02/13/2018   COPD with emphysema ([place]) 10/04/2017   Palpitations 07/30/2017   Shortness of breath 07/30/2017   Hormone replacement therapy (HRT) 03/30/2017   Former smoker 03/30/2017   Other fatigue 06/30/2016   Insomnia 06/30/2016   DJD (degenerative joint disease), cervical 06/30/2016   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/2016   IBS (irritable bowel syndrome) 06/30/2016   Interstitial cystitis 06/30/2016   SHOULDER PAIN, RIGHT 09/05/2010   GERD 03/24/2009   SACROILIAC STRAIN  03/24/2009   Atypical chest pain 01/20/2008   HEMATURIA UNSPECIFIED 10/14/2007   FIBROMYALGIA 07/03/2007   HYPERLIPIDEMIA NEC/NOS 05/16/2007   HEADACHE 05/09/2007    Earlie Counts, PT 05/26/21 11:17 AM  [facility] [address], [place], [place], [address] Phone: [phone]   Fax:  [phone]  Name: Gwendola Hornaday MRN: [id number] Date of Birth: Jun 01, 1956

## 2021-06-01 ENCOUNTER — Ambulatory Visit: Payer: 59 | Admitting: Pulmonary Disease

## 2021-06-01 ENCOUNTER — Ambulatory Visit (INDEPENDENT_AMBULATORY_CARE_PROVIDER_SITE_OTHER): Payer: 59

## 2021-06-01 ENCOUNTER — Other Ambulatory Visit: Payer: Self-pay

## 2021-06-01 ENCOUNTER — Encounter: Payer: Self-pay | Admitting: Pulmonary Disease

## 2021-06-01 VITALS — BP 114/62 | HR 78 | Temp 98.0°F | Ht 65.0 in | Wt 116.4 lb

## 2021-06-01 DIAGNOSIS — Z87891 Personal history of nicotine dependence: Secondary | ICD-10-CM | POA: Diagnosis not present

## 2021-06-01 DIAGNOSIS — R0602 Shortness of breath: Secondary | ICD-10-CM | POA: Diagnosis not present

## 2021-06-01 DIAGNOSIS — J449 Chronic obstructive pulmonary disease, unspecified: Secondary | ICD-10-CM | POA: Diagnosis not present

## 2021-06-01 MED ORDER — ANORO ELLIPTA 62.5-25 MCG/INH IN AEPB: 1.0000 | INHALATION_SPRAY | Freq: Every day | RESPIRATORY_TRACT | 5 refills | Status: DC

## 2021-06-01 MED ORDER — ALBUTEROL SULFATE HFA 108 (90 BASE) MCG/ACT IN AERS: 1.0000 | INHALATION_SPRAY | Freq: Four times a day (QID) | RESPIRATORY_TRACT | 11 refills | Status: DC | PRN

## 2021-06-01 NOTE — Patient Instructions (Signed)
Continue with her inhaler.  We will send in a prescription I will make a referral to get you restarted on low-dose screening CT of the chest Get a chest x-ray today  Follow-up in 6 months.

## 2021-06-01 NOTE — Progress Notes (Signed)
Emily Ward    [id number]    09/04/1955  Primary Care Physician:Fry, Ishmael Holter, MD  Referring Physician: Laurey Morale, MD [place],  [place] [address]  Chief complaint: Follow-up for severe COPD  HPI: 65 year old with history of mitral valve prolapse, allergies, chronic headaches Here for follow-up of severe COPD Initially evaluated in December 2019.  Started on Anoro inhaler at that time Finished pulmonary rehab in 2020  Underwent total hysterectomy with salpingectomy on 09/01/2018 under general anesthesia without any respiratory issues. Contracted COVID-19 in November 2020.  Symptoms are mostly fever, joint pain, headache and fatigue.  She did not have any respiratory symptoms except for throat irritation.  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 Smoking history: 35-pack-year smoker.  Quit in 2014 Travel history: No significant travel history Relevant family history: Mom had emphysema [name] was a smoker)  Interim history: Diagnosed with COVID again in early 2022.  She did not require hospitalization and has recovered back to baseline States that breathing is stable with mild dyspnea on exertion.  Continues on Anoro inhaler  Outpatient Encounter Medications as of 06/01/2021  Medication Sig   albuterol (VENTOLIN HFA) 108 (90 Base) MCG/ACT inhaler Inhale 1-2 puffs into the lungs every 6 (six) hours as needed for wheezing or shortness of breath.   butalbital-acetaminophen-caffeine (FIORICET) 50-325-40 MG tablet TAKE 1 TABLET BY MOUTH EVERY 6 HOURS AS NEEDED   CALCIUM PO Take 1 tablet by mouth daily.   cetirizine (ZYRTEC) 10 MG tablet Take 10 mg by mouth daily as needed for allergies (spring time).   chlorzoxazone (PARAFON) 500 MG tablet TAKE 1 TABLET BY MOUTH FOUR TIMES DAILY AS NEEDED  FOR MUSCLE SPASMS   cholecalciferol (VITAMIN D) 1000 units tablet Take 2,000 Units by mouth daily.    Cyanocobalamin (VITAMIN B-12 PO) Take 1 tablet by mouth daily.    cyclobenzaprine (FLEXERIL) 10 MG tablet TAKE 1 TABLET BY MOUTH THREE TIMES DAILY AS NEEDED FOR MUSCLE SPASMS   denosumab (PROLIA) 60 MG/ML SOSY injection Inject 60 mg into the skin every 6 (six) months.   DULoxetine (CYMBALTA) 30 MG capsule TAKE 2 CAPSULES BY MOUTH EVERY MORNING   estradiol (ESTRACE) 1 MG tablet Take 1 tablet (1 mg total) by mouth daily.   meloxicam (MOBIC) 7.5 MG tablet TAKE 1 TABLET(7.5 MG) BY MOUTH DAILY   umeclidinium-vilanterol (ANORO ELLIPTA) 62.5-25 MCG/INH AEPB Inhale 1 puff into the lungs daily.   zolmitriptan (ZOMIG) 5 MG tablet TAKE 1 TABLET BY MOUTH AS NEEDED AS DIRECTED   zolpidem (AMBIEN) 10 MG tablet TAKE 1 TABLET(10 MG) BY MOUTH AT BEDTIME   No facility-administered encounter medications on file as of 06/01/2021.   Physical Exam: Blood pressure 114/62, pulse 78, temperature 98 F (36.7 C), temperature source Oral, height 5\' 5"  (1.651 m), weight 116 lb 6.4 oz (52.8 kg), last menstrual period 09/03/2000, SpO2 95 %. Gen:      No acute distress HEENT:  EOMI, sclera anicteric Neck:     No masses; no thyromegaly Lungs:    Clear to auscultation bilaterally; normal respiratory effort CV:         Regular rate and rhythm; no murmurs Abd:      + bowel sounds; soft, non-tender; no palpable masses, no distension Ext:    No edema; adequate  peripheral perfusion Skin:      Warm and dry; no rash Neuro: alert and oriented x 3 Psych: normal mood and affect   Data Reviewed: Imaging: Screening CT 04/27/2019-right lower lobe pulmonary nodule, emphysema, linear scarring in the lower lobes. I have reviewed the images personally.  PFTs: 03/05/2019 FVC 2.22 [64%), FEV1 1.43 (53%),F/F 64, TLC 6.78 [130%], DLCO 9.79 [46%] Severe obstruction with air trapping and hyperinflation Severe diffusion  defect  Cardiac: Echo 08/21/17- LVEF 60-65%, normal wall thickness, normal wall motion, normal diastolic function, normal LA size ,trivial MR, normal RV size with low normal RV systolic function, normal IVC.  Labs: CBC 10/22/2018-WBC 6.5, eos 1%, absolute eosinophil count 65 Alpha-1 antitrypsin 08/15/2018-126, PI MM  Assessment:  Severe COPD Continue Anoro inhaler.  Will likely not benefit from inhaled corticosteroid as peripherally eosinophils are low.  She would like to avoid additional medications as well  Did not desat on exertion.  Ex-smoker She has not had a screening CT since 2020. We will get her back on the annual screening program Get chest x-ray today  Health maintenance Pneumovax 03/23/2019  Plan/Recommendations: - Continue Anoro - Resume screening CTs of the chest - Chest x-ray  Marshell Garfinkel MD [facility] 06/01/2021, 11:28 AM  CC: Laurey Morale, MD

## 2021-06-02 ENCOUNTER — Telehealth: Payer: Self-pay | Admitting: Pulmonary Disease

## 2021-06-02 DIAGNOSIS — J449 Chronic obstructive pulmonary disease, unspecified: Secondary | ICD-10-CM

## 2021-06-02 DIAGNOSIS — R0602 Shortness of breath: Secondary | ICD-10-CM

## 2021-06-02 NOTE — Telephone Encounter (Signed)
PM call report taken on pts CXR.  Below are the results:   please advise. Thanks  Narrative & Impression  CLINICAL DATA:  Shortness of breath. Recent Covid-19 viral infection.   EXAM: CHEST - 2 VIEW   COMPARISON:  None.   FINDINGS: The heart size and mediastinal contours are within normal limits. Pulmonary hyperinflation again seen, suspicious for COPD.   No evidence of pulmonary infiltrate or edema. No evidence of pleural effusion. A nodular density is seen in the central right upper lobe which was not seen on previous study. Cervical spine fusion hardware again noted.   IMPRESSION: Nodular density in central right upper lobe, new since prior study. Recommend chest CT without contrast to exclude pulmonary neoplasm.   COPD.   These results will be called to the ordering clinician or representative by the Radiologist Assistant, and communication documented in the PACS or [employer].     Electronically Signed   By: Marlaine Hind M.D.   On: 06/02/2021 13:48

## 2021-06-02 NOTE — Telephone Encounter (Signed)
Order placed. Patient aware.   [facility] to contact patient to schedule.   Nothing further needed at this time.

## 2021-06-02 NOTE — Telephone Encounter (Signed)
I called and discussed with patient  Please order super D CT chest.

## 2021-06-06 ENCOUNTER — Telehealth: Payer: Self-pay | Admitting: Pulmonary Disease

## 2021-06-06 NOTE — Telephone Encounter (Signed)
Pt stated that she feels that the proair respiclick works better for her than the ventolin HFA.  Pt is requesting that this be sent to the pharmacy for her.     I have called the pt and she is aware that the proair respiclick is not covered by her insurance.  She decided to stay with the ventolin hfa.

## 2021-06-12 ENCOUNTER — Other Ambulatory Visit: Payer: Self-pay | Admitting: Family Medicine

## 2021-06-12 NOTE — Telephone Encounter (Signed)
Last office visit- 02/07/21 Last refill- 12/20/20--120 tab, 5 refills  No future office visit scheduled.  Can the patient receive a refill?

## 2021-06-13 ENCOUNTER — Other Ambulatory Visit: Payer: Self-pay

## 2021-06-13 ENCOUNTER — Ambulatory Visit (INDEPENDENT_AMBULATORY_CARE_PROVIDER_SITE_OTHER)
Admission: RE | Admit: 2021-06-13 | Discharge: 2021-06-13 | Disposition: A | Discharge status: Home / Self Care | Payer: 59 | Source: Ambulatory Visit | Attending: Pulmonary Disease | Admitting: Pulmonary Disease

## 2021-06-13 DIAGNOSIS — J449 Chronic obstructive pulmonary disease, unspecified: Secondary | ICD-10-CM

## 2021-06-13 DIAGNOSIS — R0602 Shortness of breath: Secondary | ICD-10-CM | POA: Diagnosis not present

## 2021-06-21 ENCOUNTER — Other Ambulatory Visit: Payer: Self-pay | Admitting: Rheumatology

## 2021-06-21 NOTE — Telephone Encounter (Signed)
Next Visit: 08/01/2021  Last Visit: 01/26/2021  Last Fill: 04/16/2021   Dx: Fibromyalgia   Current Dose per office note on 01/26/2021: Cymbalta 30 mg 2 capsules by mouth daily  Okay to refill Cymbalta?

## 2021-06-26 ENCOUNTER — Other Ambulatory Visit: Payer: Self-pay | Admitting: *Deleted

## 2021-06-26 DIAGNOSIS — Z87891 Personal history of nicotine dependence: Secondary | ICD-10-CM

## 2021-07-18 NOTE — Progress Notes (Signed)
Office Visit Note  Patient: Emily Ward             Date of Birth: Jan 31, 1956           MRN: [id number]             PCP: Laurey Morale, MD Referring: Laurey Morale, MD Visit Date: 08/01/2021 Occupation: @GUAROCC @  Subjective:  Pain in multiple joints.   History of Present Illness: Emily Ward is a 65 y.o. female with a history of fibromyalgia, osteoarthritis and degenerative disc disease.  She states she has been having some discomfort in her cervical spine.  She got a new mattress and she thinks it may be related to it.  She continues to have some stiffness in her hands.  She had cervical spine injection by Dr. Ernestina Patches few months ago which was helpful.  She was also having some discomfort in her left knee joint.  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  She recently had left knee joint cortisone injection at [facility] which was helpful.  Activities of Daily Living:  Patient reports morning stiffness for 0 minutes.   Patient Reports nocturnal pain.  Difficulty dressing/grooming: Denies Difficulty climbing stairs: Denies Difficulty getting out of chair: Denies Difficulty using hands for taps, buttons, cutlery, and/or writing: Denies  Review of Systems  Constitutional:  Positive for fatigue.  HENT:  Positive for mouth dryness. Negative for mouth sores and nose dryness.   Eyes:  Negative for pain, itching and dryness.  Respiratory:  Positive for shortness of breath. Negative for difficulty breathing.   Cardiovascular:  Negative for chest pain and palpitations.  Gastrointestinal:  Negative for blood in stool, constipation and diarrhea.  Endocrine: Negative for increased urination.  Genitourinary:  Negative for difficulty urinating.  Musculoskeletal:  Positive for myalgias, muscle tenderness and myalgias. Negative for joint pain, joint pain, joint swelling and morning stiffness.  Skin:  Negative for color change, rash and redness.   Allergic/Immunologic: Negative for susceptible to infections.  Neurological:  Positive for headaches and weakness. Negative for dizziness, numbness and memory loss.  Hematological:  Negative for bruising/bleeding tendency.  Psychiatric/Behavioral:  Negative for confusion.    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   Palpitations 07/30/2017   Shortness of breath 07/30/2017   Hormone replacement therapy (HRT) 03/30/2017   Former smoker 03/30/2017   Other fatigue 06/30/2016   Insomnia 06/30/2016   DJD (degenerative joint disease), cervical 06/30/2016   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/2016   IBS (irritable bowel syndrome) 06/30/2016   Interstitial cystitis 06/30/2016   SHOULDER PAIN, RIGHT 09/05/2010   GERD 03/24/2009   SACROILIAC STRAIN 03/24/2009   Atypical chest pain 01/20/2008   HEMATURIA UNSPECIFIED 10/14/2007   FIBROMYALGIA 07/03/2007   HYPERLIPIDEMIA NEC/NOS 05/16/2007   HEADACHE 05/09/2007    Past Medical History:  Diagnosis Date   Cervical disc disease 2006   COPD (chronic obstructive pulmonary disease) ([place])    Emphysema lung ([place])    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     Fibromyalgia    sees Dr. 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 brother   Arthritis Son        psoriatic   Colon cancer Neg Hx    Esophageal cancer Neg Hx    Rectal cancer Neg Hx    Stomach cancer Neg Hx    Past Surgical History:  Procedure Laterality Date   CERVICAL LAMINECTOMY  2006   [place]   CHOLECYSTECTOMY  06/2002   COLONOSCOPY  06/06/2012   per Dr. Olevia Perches, benign polyp, repeat in 10 yrs    CYSTOSCOPY N/A 09/01/2018   Procedure: CYSTOSCOPY;  Surgeon: Megan Salon, MD;  Location: [facility];  Service: Gynecology;  Laterality: N/A;   DILATATION & CURETTAGE/HYSTEROSCOPY WITH MYOSURE N/A 04/12/2017   Procedure: DILATATION & CURETTAGE/HYSTEROSCOPY;  Surgeon: Megan Salon, MD;  Location: [facility];  Service: Gynecology;  Laterality: N/A;   facet joint block  03/30/2021   per patient, performed by Dr. Ernestina Patches   HYSTEROSCOPY     years ago with Dr Radene Knee-? [place] SALPINGECTOMY Bilateral 09/01/2018   Procedure: TOTAL LAPAROSCOPIC HYSTERECTOMY WITH SALPINGECTOMY  BSO, REPAIR OF PERINEAL LACERATION;  Surgeon: Megan Salon, MD;  Location: [facility];  Service: Gynecology;  Laterality: Bilateral;  possible BSO   URETHRAL DILATION     Social History   Social History Narrative   Not on file   Immunization History  Administered Date(s) Administered   Influenza Whole 06/30/2007, 05/04/2010   Influenza,inj,Quad PF,6+ Mos 07/12/2017, 05/23/2018   Influenza-Unspecified 06/02/2014, 06/03/2016, 06/20/2020   MMR 06/30/2007   Moderna Sars-Covid-2 Vaccination 09/07/2019, 10/12/2019, 07/13/2020   Pneumococcal Polysaccharide-23 03/23/2019   Td 09/03/1998, 05/05/2009   Tdap 10/22/2018     Objective: Vital Signs: BP 121/80 (BP Location: Left Arm, Patient Position: Sitting, Cuff Size: Normal)   Pulse 69   Ht $R'5\' 5"'Dw$  (1.651 m)   Wt 112 lb (50.8 kg)   LMP 09/03/2000 (Approximate)   BMI 18.64 kg/m    Physical Exam Vitals  and nursing note reviewed.  Constitutional:      Appearance: She is well-developed.  HENT:     Head: Normocephalic and atraumatic.  Eyes:     Conjunctiva/sclera: Conjunctivae normal.  Cardiovascular:     Rate and Rhythm: Normal rate and regular rhythm.     Heart sounds: Normal heart sounds.  Pulmonary:     Effort: Pulmonary effort is normal.     Breath sounds: Normal breath sounds.  Abdominal:     General: Bowel sounds are normal.     Palpations: Abdomen is soft.  Musculoskeletal:     Cervical back: Normal range of motion.  Lymphadenopathy:     Cervical: No cervical adenopathy.  Skin:    General: Skin is warm and dry.     Capillary Refill: Capillary refill takes less than 2 seconds.  Neurological:     Mental Status: She is alert  and oriented to person, place, and time.  Psychiatric:        Behavior: Behavior normal.     Musculoskeletal Exam: C-spine was in good range of motion.  Lumbar spine was in good range of motion.  Shoulder joints, elbow joints, wrist joints, MCPs PIPs and DIPs with good range of motion.  She had bilateral PIP and DIP thickening.  Hip joints and knee joints in good range of motion.  She had no tenderness over ankles or MTPs.  CDAI Exam: CDAI Score: -- Patient Global: --; Provider Global: -- Swollen: --; Tender: -- Joint Exam 08/01/2021   No joint exam has been documented for this visit   There is currently no information documented on the homunculus. Go to the Rheumatology activity and complete the homunculus joint exam.  Investigation: No additional findings.  Imaging: No results found.  Recent Labs: Lab Results  Component Value Date   WBC 4.3 09/06/2020   HGB 13.9 09/06/2020   PLT 177.0 09/06/2020   NA 138 09/06/2020   K 5.2 (H) 09/06/2020   CL 103 09/06/2020   CO2 31 09/06/2020   GLUCOSE 87 09/06/2020   BUN 21 09/06/2020   CREATININE 0.63 09/06/2020   BILITOT 0.3 09/06/2020   ALKPHOS 61 09/06/2020   AST 18 09/06/2020   ALT 7  09/06/2020   PROT 6.9 09/06/2020   ALBUMIN 4.5 09/06/2020   CALCIUM 9.5 09/06/2020   GFRAA >60 10/22/2018    Speciality Comments: No specialty comments available.  Procedures:  No procedures performed Allergies: Divalproex sodium and Nsaids   Assessment / Plan:     Visit Diagnoses: Pain in both hands-she continues to have pain and stiffness in her bilateral hands.  No synovitis was noted.  DIP and PIP thickening was noted consistent with osteoarthritis.  Joint protection muscle strengthening was discussed.  Trapezius muscle spasm-she has been experiencing increased trapezius spasm which she relates to a new mattress.  Stretching exercises were emphasized.  DDD (degenerative disc disease), cervical - MRI performed on 05/03/20: She had facet joint injection in the past by Dr. Ernestina Patches which was helpful.  DDD (degenerative disc disease), lumbar - Flexeril 10 mg 1 tablet 3 times daily as needed for muscle spasms.   Fibromyalgia -she continues to have some generalized pain and discomfort.  She had positive tender points.  Cymbalta has been helpful.  She is currently on Cymbalta 30 mg 2 capsules by mouth daily.   Primary insomnia-sleep hygiene was discussed.  TMJ (temporomandibular joint syndrome)-she has intermittent discomfort.  Age-related osteoporosis without current pathological fracture - DEXA on 12/13/17: The BMD measured at [facility] is 0.516 g/cm2 with a T-score of -3.9.  She is on Prolia since 2019 by Dr. Chalmers Cater.  Her last Prolia injection was on July 31, 2021.  Patient wants to switch her Prolia injections to her office.  I will discuss that at the follow-up visit.  Other medical problems are listed as follows:  History of gastroesophageal reflux (GERD)  History of IBS  Interstitial cystitis  Orders: No orders of the defined types were placed in this encounter.  No orders of the defined types were placed in this encounter.    Follow-Up Instructions: Return in  about 5 months (around 12/30/2021) for Osteoarthritis.   Bo Merino, MD  Note - This record has been created using Editor, commissioning.  Chart creation errors have been sought, but may not always  have been located. Such creation errors do not reflect on  the  standard of medical care.

## 2021-07-24 ENCOUNTER — Encounter: Payer: Self-pay | Admitting: Physical Therapy

## 2021-07-24 ENCOUNTER — Ambulatory Visit: Payer: 59 | Attending: Obstetrics & Gynecology | Admitting: Physical Therapy

## 2021-07-24 ENCOUNTER — Other Ambulatory Visit: Payer: Self-pay

## 2021-07-24 DIAGNOSIS — M6281 Muscle weakness (generalized): Secondary | ICD-10-CM | POA: Insufficient documentation

## 2021-07-24 DIAGNOSIS — R278 Other lack of coordination: Secondary | ICD-10-CM | POA: Insufficient documentation

## 2021-07-24 DIAGNOSIS — R32 Unspecified urinary incontinence: Secondary | ICD-10-CM | POA: Diagnosis present

## 2021-07-24 NOTE — Therapy (Signed)
[facility] @ [address] [place], [place], [address] Phone: [phone]   Fax:  [phone]  Physical Therapy Treatment  Patient Details  Name: Emily Ward MRN: [id number] Date of Birth: 04/20/1956 Referring Provider (PT): Dr. Hale Bogus   Encounter Date: 07/24/2021   PT End of Session - 07/24/21 1627     Visit Number 11    Date for PT Re-Evaluation 09/15/21    Authorization Type [employer]    Authorization - Visit Number 11    Authorization - Number of Visits 60    PT Start Time 3875    PT Stop Time 6433    PT Time Calculation (min) 38 min    Activity Tolerance Patient tolerated treatment well;No increased pain    Behavior During Therapy [facility] for tasks assessed/performed             Past Medical History:  Diagnosis Date   Cervical disc disease 2006   COPD (chronic obstructive pulmonary disease) ([place])    Emphysema lung ([place])    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     Fibromyalgia    sees Dr. 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 Olevia Perches, benign polyp, repeat in 10 yrs    CYSTOSCOPY N/A 09/01/2018   Procedure: CYSTOSCOPY;  Surgeon: Megan Salon, MD;  Location: [facility];  Service: Gynecology;  Laterality: N/A;   DILATATION & CURETTAGE/HYSTEROSCOPY WITH MYOSURE N/A 04/12/2017   Procedure: DILATATION & CURETTAGE/HYSTEROSCOPY;  Surgeon: Megan Salon, MD;  Location: [facility];  Service: Gynecology;  Laterality: N/A;   HYSTEROSCOPY     years ago with Dr Radene Knee-? 2000   TOTAL LAPAROSCOPIC HYSTERECTOMY WITH  SALPINGECTOMY Bilateral 09/01/2018   Procedure: TOTAL LAPAROSCOPIC HYSTERECTOMY WITH SALPINGECTOMY  BSO, REPAIR OF PERINEAL LACERATION;  Surgeon: Megan Salon, MD;  Location: [place];  Service: Gynecology;  Laterality: Bilateral;  possible BSO   URETHRAL DILATION      There were no vitals filed for this visit.   Subjective Assessment - 07/24/21 1621     Subjective I leak with coughing or sneezing. It is not daily.    Patient Stated Goals reduce leakage    Currently in Pain? No/denies    Multiple Pain Sites No                [facility] PT Assessment - 07/24/21 0001       Assessment   Medical Diagnosis R32 urinary incontinence in female    Referring Provider (PT) Dr. 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    Leisure walking, would like to get into yoga      Cognition   Overall Cognitive Status Within Functional Limits for tasks assessed      ROM / Strength   AROM / PROM / Strength AROM;PROM;Strength      Strength   Right Hip Flexion 4+/5    Right Hip Extension 5/5    Right Hip External Rotation  5/5    Right Hip Internal Rotation 4/5    Left Hip Flexion 4+/5    Left Hip External Rotation 5/5    Left Hip Internal Rotation 4/5    Left Hip ABduction 4/5    Left Hip ADduction 4+/5      Palpation   SI assessment  ASIS are equal                        Pelvic Floor Special Questions - 07/24/21 0001     Pad use 2 pads per day due to vaginal discharge, does not feel comfortable to go without leakage    Urinary frequency able to empty her bladder                [facility] Adult PT Treatment/Exercise - 07/24/21 0001       Lumbar Exercises: Supine   Dead Bug 20 reps;1 second    Dead Bug Limitations with pelvic floor contraction    Other Supine Lumbar Exercises supine with feet on ball and do a bridge with pelvic floor contraction 15x; supine with feet on ball and rotate knees side to side with pelvic floor and abdominal contraction      Lumbar Exercises: Sidelying   Hip Abduction Right;Left;10 reps;1 second    Hip Abduction Weights (lbs) with pelvic floor contraction                     PT Education - 07/24/21 1656     Education Details Access Code: [email or web address]    Person(s) Educated Patient    Methods Explanation;Demonstration;Verbal cues;Handout    Comprehension Returned demonstration;Verbalized understanding              PT Short Term Goals - 01/18/21 1221       PT SHORT TERM GOAL #1   Title independent with initial HEP with diaphragmatic breathing and stretches    Time 4    Period Weeks    Status Achieved    Target Date 01/04/21      PT SHORT TERM GOAL #2   Title understand on how to perform her own perineal massage to lengthen the pelvic floor muscles    Time 4    Period Weeks    Status Achieved               PT Long Term Goals - 07/24/21 1646       PT LONG TERM GOAL #1   Title independent with advanced HEP for pelvic floor strength    Time 12    Period Weeks    Status On-going      PT LONG TERM GOAL #2   Title urinary leakage decreased so she is wearing </= 1 pad per day    Baseline wears 2 pads per day    Time 12    Period Weeks    Status On-going      PT LONG TERM GOAL #3   Title pelvic floor strength is >/= 3/5 and  able to fully relax so she has increased coordination of the muscles to prevent urinary leakage.    Baseline hurt her knee so was not able to exercise so leakage is back    Time 12    Period Weeks    Status On-going      PT LONG TERM GOAL #4   Title able to cough, laugh,  lift without leaking urine due to full contraction of the pelvic floor quickly with full relaxation    Time 12    Period Weeks    Status Not Met      PT LONG TERM GOAL #5   Title educated on vaginal health to incorporate moisturizers and lubricants to reduce dryness and friction with intercourse    Time 12    Period Weeks    Status Achieved                   Plan - 07/24/21 1628     Clinical Impression Statement Patient hurt her knee and made it difficult to for her to do her pelvic floor exercises so her leakage has returned with coughing and sneezing. Patient has weakness in her hips. Her pelvic floor strength is 2/5. Patient has difficulty with quick contraction of the pelvic floor. Patient has learned new exercises that will not aggravate her knee. Her pelvis in correct alignment. Patient will benefit from skilled therapy to reduce the trigger points and fascial restrictions on the pelvic floor and improve pelvic floor strength.    Personal Factors and Comorbidities Comorbidity 3+;Fitness;Sex    Comorbidities Fibromyalgia; COPD; C-section 1993; Hysterectomy 09/01/2018; Cervical lamenectomy    Examination-Activity Limitations Continence;Lift;Locomotion Level;Carry;Stand;Toileting    Examination-Participation Restrictions Community Activity;Cleaning;Meal Prep;Interpersonal Relationship    Stability/Clinical Decision Making Stable/Uncomplicated    Rehab Potential Excellent    PT Frequency 1x / week    PT Duration 8 weeks    PT Treatment/Interventions ADLs/Self Care Home Management;Cryotherapy;Moist Heat;Therapeutic activities;Therapeutic exercise;Neuromuscular re-education;Manual techniques;Patient/family education;Dry needling    PT Next Visit Plan work on abdominals abdominal tissue to release trigger points, manual work to the pelvic floor to improve pelvic floor contraction    PT Home Exercise Plan Access Code: [email or web address]    Consulted and Agree with Plan of Care Patient              Patient will benefit from skilled therapeutic intervention in order to improve the following deficits and impairments:  Decreased coordination, Increased fascial restricitons, Pain, Decreased endurance, Increased muscle spasms, Decreased activity tolerance, Decreased strength  Visit Diagnosis: Muscle weakness (generalized) - Plan: PT plan of care cert/re-cert  Other lack of coordination - Plan: PT plan of care cert/re-cert  Urinary incontinence, unspecified type - Plan: PT plan of care cert/re-cert     Problem List Patient Active Problem List   Diagnosis Date Noted   Hyperparathyroidism ([place]) 10/18/2020   Osteoporosis 10/18/2020   Concussion 10/27/2018   Postmenopausal bleeding 09/01/2018   MGUS (monoclonal gammopathy of unknown significance) 02/13/2018   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   Palpitations 07/30/2017   Shortness of breath 07/30/2017   Hormone replacement therapy (HRT) 03/30/2017   Former smoker 03/30/2017   Other fatigue 06/30/2016   Insomnia 06/30/2016   DJD (degenerative joint disease), cervical 06/30/2016   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/2016   IBS (irritable bowel syndrome) 06/30/2016   Interstitial cystitis 06/30/2016   SHOULDER PAIN, RIGHT 09/05/2010   GERD 03/24/2009   SACROILIAC STRAIN 03/24/2009   Atypical chest  pain 01/20/2008   HEMATURIA UNSPECIFIED 10/14/2007   FIBROMYALGIA 07/03/2007   HYPERLIPIDEMIA NEC/NOS 05/16/2007   HEADACHE 05/09/2007   Earlie Counts, PT [facility] @ [address] [place], [place], [address] Phone: [phone]   Fax:  [phone]  Name: Emily Ward MRN: [id number] Date of Birth: 13-May-1956

## 2021-07-24 NOTE — Patient Instructions (Signed)
Access Code: [email or web address] URL: [email or web address] Date: 07/24/2021 Prepared by: Earlie Counts  Exercises Hooklying Transversus Abdominis Palpation - 1 x daily - 7 x weekly - 1 sets - 10 reps - 5 sec hold Sit to Stand with Pelvic Floor Contraction - 1 x daily - 7 x weekly - 1 sets - 10 reps Supine Dead Bug with Leg Extension - 1 x daily - 3 x weekly - 1 sets - 10 reps Sidelying Pelvic Floor Contraction with Hip Abduction - 1 x daily - 3 x weekly - 2 sets - 10 reps Supine Bridge with Pelvic Floor Contraction on Swiss Ball - 1 x daily - 3 x weekly - 2 sets - 10 reps Supine Lower Trunk Rotation with Swiss Ball - 1 x daily - 3 x weekly - 1 sets - 10 reps Seated Pelvic Floor Contraction on Swiss Ball - 1 x daily - 3 x weekly - 1 sets - 10 reps  [facility] [address] [place], [place] [address] Phone # [phone] Fax [phone]

## 2021-08-01 ENCOUNTER — Encounter: Payer: Self-pay | Admitting: Rheumatology

## 2021-08-01 ENCOUNTER — Ambulatory Visit: Payer: 59 | Admitting: Rheumatology

## 2021-08-01 ENCOUNTER — Other Ambulatory Visit: Payer: Self-pay

## 2021-08-01 VITALS — BP 121/80 | HR 69 | Ht 65.0 in | Wt 112.0 lb

## 2021-08-01 DIAGNOSIS — M79641 Pain in right hand: Secondary | ICD-10-CM

## 2021-08-01 DIAGNOSIS — F5101 Primary insomnia: Secondary | ICD-10-CM

## 2021-08-01 DIAGNOSIS — M62838 Other muscle spasm: Secondary | ICD-10-CM

## 2021-08-01 DIAGNOSIS — M5030 Other cervical disc degeneration, unspecified cervical region: Secondary | ICD-10-CM | POA: Diagnosis not present

## 2021-08-01 DIAGNOSIS — M797 Fibromyalgia: Secondary | ICD-10-CM

## 2021-08-01 DIAGNOSIS — M5136 Other intervertebral disc degeneration, lumbar region: Secondary | ICD-10-CM | POA: Diagnosis not present

## 2021-08-01 DIAGNOSIS — M26609 Unspecified temporomandibular joint disorder, unspecified side: Secondary | ICD-10-CM

## 2021-08-01 DIAGNOSIS — M79642 Pain in left hand: Secondary | ICD-10-CM

## 2021-08-01 DIAGNOSIS — M810 Age-related osteoporosis without current pathological fracture: Secondary | ICD-10-CM

## 2021-08-01 DIAGNOSIS — N3010 Interstitial cystitis (chronic) without hematuria: Secondary | ICD-10-CM

## 2021-08-01 DIAGNOSIS — Z8719 Personal history of other diseases of the digestive system: Secondary | ICD-10-CM

## 2021-08-02 ENCOUNTER — Ambulatory Visit: Payer: 59 | Admitting: Physical Therapy

## 2021-08-02 ENCOUNTER — Encounter: Payer: Self-pay | Admitting: Physical Therapy

## 2021-08-02 DIAGNOSIS — M6281 Muscle weakness (generalized): Secondary | ICD-10-CM

## 2021-08-02 DIAGNOSIS — R278 Other lack of coordination: Secondary | ICD-10-CM

## 2021-08-02 DIAGNOSIS — R32 Unspecified urinary incontinence: Secondary | ICD-10-CM

## 2021-08-02 NOTE — Therapy (Signed)
[facility] @ [address] [place], [place], [address] Phone: [phone]   Fax:  [phone]  Physical Therapy Treatment  Patient Details  Name: Emily Ward MRN: [id number] Date of Birth: Jul 04, 1956 Referring Provider (PT): Dr. Hale Bogus   Encounter Date: 08/02/2021   PT End of Session - 08/02/21 1403     Visit Number 12    Date for PT Re-Evaluation 09/15/21    Authorization Type UHC    Authorization - Visit Number 12    Authorization - Number of Visits 60    PT Start Time 1400    PT Stop Time 3532    PT Time Calculation (min) 38 min    Activity Tolerance Patient tolerated treatment well;No increased pain    Behavior During Therapy [facility] for tasks assessed/performed             Past Medical History:  Diagnosis Date   Cervical disc disease 2006   COPD (chronic obstructive pulmonary disease) ([place])    Emphysema lung ([place])    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     Fibromyalgia    sees Dr. 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 Olevia Perches, benign polyp, repeat in 10 yrs    CYSTOSCOPY N/A 09/01/2018   Procedure: CYSTOSCOPY;  Surgeon: Megan Salon, MD;  Location: [facility];  Service: Gynecology;  Laterality: N/A;   DILATATION & CURETTAGE/HYSTEROSCOPY WITH MYOSURE N/A 04/12/2017   Procedure: DILATATION & CURETTAGE/HYSTEROSCOPY;  Surgeon: Megan Salon, MD;  Location: [facility];  Service: Gynecology;  Laterality: N/A;   facet joint block  03/30/2021   per patient, performed by Dr. Ernestina Patches   HYSTEROSCOPY      years ago with Dr Radene Knee-? [place] SALPINGECTOMY Bilateral 09/01/2018   Procedure: TOTAL LAPAROSCOPIC HYSTERECTOMY WITH SALPINGECTOMY  BSO, REPAIR OF PERINEAL LACERATION;  Surgeon: Megan Salon, MD;  Location: [facility];  Service: Gynecology;  Laterality: Bilateral;  possible BSO   URETHRAL DILATION      There were no vitals filed for this visit.   Subjective Assessment - 08/02/21 1404     Subjective I still use 2 pads per day. I have not had leakage when I cough. I am changing the pad more due to vaginal discharge. I am doing the exercises.    Patient Stated Goals reduce leakage    Currently in Pain? No/denies    Multiple Pain Sites No                            Pelvic Floor Special Questions - 08/02/21 0001     Pelvic Floor Internal Exam Patient confirms identification and approves PT to assess and treat    Exam Type Vaginal    Palpation increased tone in th eperineal body, along the buobocavernsous and superior transverse    Strength weak squeeze, no lift  [facility] Adult PT Treatment/Exercise - 08/02/21 0001       Self-Care   Self-Care Other Self-Care Comments    Other Self-Care Comments  discussed wtih pateint on wine being a bladder irritants.      Neuro Re-ed    Neuro Re-ed Details  diaphragmatic breathing to elongate the pelvic floor and able to perform correctly but needed several trials and verbal cues      Manual Therapy   Manual Therapy Internal Pelvic Floor    Internal Pelvic Floor manual work to the bulbocavernosus, perineal body and superio transverse perineum to elongate but took awhile                       PT Short Term Goals - 01/18/21 1221       PT SHORT TERM GOAL #1   Title independent with initial HEP with diaphragmatic breathing and stretches    Time 4    Period Weeks    Status Achieved    Target Date 01/04/21      PT SHORT TERM GOAL #2   Title  understand on how to perform her own perineal massage to lengthen the pelvic floor muscles    Time 4    Period Weeks    Status Achieved               PT Long Term Goals - 08/02/21 1410       PT LONG TERM GOAL #1   Title independent with advanced HEP for pelvic floor strength    Baseline not able to due to her left lower quadrant pain    Time 12    Period Weeks    Status On-going      PT LONG TERM GOAL #2   Title urinary leakage decreased so she is wearing </= 1 pad per day    Baseline wears 2 pads per day but may be due to discharge    Time 12    Period Weeks    Status On-going      PT LONG TERM GOAL #3   Title pelvic floor strength is >/= 3/5 and able to fully relax so she has increased coordination of the muscles to prevent urinary leakage.    Baseline hurt her knee so was not able to exercise so leakage is back    Time 12    Period Weeks    Status On-going      PT LONG TERM GOAL #4   Title able to cough, laugh, lift without leaking urine due to full contraction of the pelvic floor quickly with full relaxation    Baseline cough hard, possible lifting with full bladder    Time 12    Period Weeks    Status On-going      PT LONG TERM GOAL #5   Title educated on vaginal health to incorporate moisturizers and lubricants to reduce dryness and friction with intercourse    Time 12    Period Weeks    Status Achieved                   Plan - 08/02/21 1441     Clinical Impression Statement Patient has tightness in the perineal body, superior transverse perineum and bulbocavernosus. She had trouble initially relaxing the pelvic floor with diaphragmatic breathing but when she closed her eyes to focus she was able to do it correctly. Patient strength in the pelvic floor is 2/5 due to muscle tightness. Patient  will wear 2 pads due to slight urinary leakage and vaginal discharge. Patient leaks with hard cough or lifting with a full bladder. She does not leak with  laughing anymore. Patient will benefit from skilled therapy to improve pelvic floor elongation and coordination to reduce leakage.    Personal Factors and Comorbidities Comorbidity 3+;Fitness;Sex    Comorbidities Fibromyalgia; COPD; C-section 1993; Hysterectomy 09/01/2018; Cervical lamenectomy    Examination-Activity Limitations Continence;Lift;Locomotion Level;Carry;Stand;Toileting    Examination-Participation Restrictions Community Activity;Cleaning;Meal Prep;Interpersonal Relationship    Stability/Clinical Decision Making Stable/Uncomplicated    Rehab Potential Excellent    PT Frequency 1x / week    PT Duration 8 weeks    PT Treatment/Interventions ADLs/Self Care Home Management;Cryotherapy;Moist Heat;Therapeutic activities;Therapeutic exercise;Neuromuscular re-education;Manual techniques;Patient/family education;Dry needling    PT Next Visit Plan manual work to the pelvic floor to improve pelvic floor contraction, breathing with elongation of pelvic floor; contraction with hard cough    PT Home Exercise Plan Access Code: [email or web address]    Consulted and Agree with Plan of Care Patient             Patient will benefit from skilled therapeutic intervention in order to improve the following deficits and impairments:  Decreased coordination, Increased fascial restricitons, Pain, Decreased endurance, Increased muscle spasms, Decreased activity tolerance, Decreased strength  Visit Diagnosis: Muscle weakness (generalized)  Other lack of coordination  Urinary incontinence, unspecified type     Problem List Patient Active Problem List   Diagnosis Date Noted   Hyperparathyroidism ([place]) 10/18/2020   Osteoporosis 10/18/2020   Concussion 10/27/2018   Postmenopausal bleeding 09/01/2018   MGUS (monoclonal gammopathy of unknown significance) 02/13/2018   COPD with emphysema ([place]) 10/04/2017   Palpitations 07/30/2017   Shortness of breath 07/30/2017   Hormone replacement therapy (HRT)  03/30/2017   Former smoker 03/30/2017   Other fatigue 06/30/2016   Insomnia 06/30/2016   DJD (degenerative joint disease), cervical 06/30/2016   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/2016   IBS (irritable bowel syndrome) 06/30/2016   Interstitial cystitis 06/30/2016   SHOULDER PAIN, RIGHT 09/05/2010   GERD 03/24/2009   SACROILIAC STRAIN 03/24/2009   Atypical chest pain 01/20/2008   HEMATURIA UNSPECIFIED 10/14/2007   FIBROMYALGIA 07/03/2007   HYPERLIPIDEMIA NEC/NOS 05/16/2007   HEADACHE 05/09/2007    Earlie Counts, PT 08/02/21 2:46 PM  [facility] @ [address] [place], [place], [address] Phone: [phone]   Fax:  [phone]  Name: Emily Ward MRN: [id number] Date of Birth: 1956-04-04

## 2021-08-08 ENCOUNTER — Other Ambulatory Visit: Payer: Self-pay | Admitting: Family Medicine

## 2021-08-08 ENCOUNTER — Telehealth: Payer: Self-pay | Admitting: *Deleted

## 2021-08-08 NOTE — Telephone Encounter (Signed)
Patient states she would like to know if she can decrease her Cymbalta to 40 mg. Patient states she has been doing well. Please advise.

## 2021-08-08 NOTE — Telephone Encounter (Signed)
She may reduce Cymbalta to 60 mg alternating with 30 mg for 1 week.  If she tolerates the reduced dose then she may decrease Cymbalta to 30 mg p.o. daily.

## 2021-08-08 NOTE — Telephone Encounter (Signed)
Received a potential drug to drug interaction from [employer].   Possible interaction between: Cyclobenzaprine and Duloxetine.  Possible Risks: Increased risk on Serotonin Syndrome  Reviewed by: Hazel Sams, PA-C  Recommendations: Patient is taking low dose of Cymbalta. Please make patient aware of the risks for serotonin syndrome.    Notified patient of possible interactions between Cyclobenzaprine and Duloxetine. Advised risk include: Increased risk of serotonin syndrome (agitation and altered consciousness) Patient advised. Patient expressed understanding.

## 2021-08-08 NOTE — Telephone Encounter (Signed)
Patient advised she may reduce Cymbalta to 60 mg alternating with 30 mg for 1 week.  If she tolerates the reduced dose then she may decrease Cymbalta to 30 mg p.o. daily

## 2021-08-09 ENCOUNTER — Ambulatory Visit: Payer: 59 | Attending: Obstetrics & Gynecology | Admitting: Physical Therapy

## 2021-08-09 ENCOUNTER — Encounter: Payer: Self-pay | Admitting: Physical Therapy

## 2021-08-09 ENCOUNTER — Other Ambulatory Visit: Payer: Self-pay

## 2021-08-09 DIAGNOSIS — M6281 Muscle weakness (generalized): Secondary | ICD-10-CM | POA: Diagnosis not present

## 2021-08-09 DIAGNOSIS — R278 Other lack of coordination: Secondary | ICD-10-CM | POA: Insufficient documentation

## 2021-08-09 DIAGNOSIS — R32 Unspecified urinary incontinence: Secondary | ICD-10-CM | POA: Diagnosis present

## 2021-08-09 NOTE — Therapy (Signed)
[facility] @ [address] [place], [place], [address] Phone: [phone]   Fax:  [phone]  Physical Therapy Treatment  Patient Details  Name: Emily Ward MRN: [id number] Date of Birth: [birth year] Referring Provider (PT): Dr. Hale Bogus   Encounter Date: 08/09/2021   PT End of Session - 08/09/21 1606     Visit Number 13    Date for PT Re-Evaluation 09/15/21    Authorization Type UHC    Authorization - Visit Number 13    Authorization - Number of Visits 60    PT Start Time 7209    PT Stop Time 4709    PT Time Calculation (min) 38 min    Activity Tolerance Patient tolerated treatment well;No increased pain    Behavior During Therapy [facility] for tasks assessed/performed             Past Medical History:  Diagnosis Date   Cervical disc disease 2006   COPD (chronic obstructive pulmonary disease) ([place])    Emphysema lung ([place])    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     Fibromyalgia    sees Dr. 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 Olevia Perches, benign polyp, repeat in 10 yrs    CYSTOSCOPY N/A 09/01/2018   Procedure: CYSTOSCOPY;  Surgeon: Megan Salon, MD;  Location: [facility];  Service: Gynecology;  Laterality: N/A;   DILATATION & CURETTAGE/HYSTEROSCOPY WITH MYOSURE N/A 04/12/2017   Procedure: DILATATION & CURETTAGE/HYSTEROSCOPY;  Surgeon: Megan Salon, MD;  Location: [facility];  Service: Gynecology;  Laterality: N/A;   facet joint block  03/30/2021   per patient, performed by Dr. Ernestina Patches   HYSTEROSCOPY      years ago with Dr Radene Knee-? [place] SALPINGECTOMY Bilateral 09/01/2018   Procedure: TOTAL LAPAROSCOPIC HYSTERECTOMY WITH SALPINGECTOMY  BSO, REPAIR OF PERINEAL LACERATION;  Surgeon: Megan Salon, MD;  Location: [facility];  Service: Gynecology;  Laterality: Bilateral;  possible BSO   URETHRAL DILATION      There were no vitals filed for this visit.   Subjective Assessment - 08/09/21 1534     Subjective I stilll wear a pad. Walking to the bathroom may leak urine. Still wear a pad when going out due to fear of leaking, Cough unexpectantly will leak.    Patient Stated Goals reduce leakage    Currently in Pain? No/denies                [facility] PT Assessment - 08/09/21 0001       Assessment   Medical Diagnosis R32 urinary incontinence in female    Referring Provider (PT) Dr. 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    Leisure walking, would like to get into yoga      Cognition   Overall Cognitive Status Within Functional Limits for tasks assessed                           [facility] Adult PT Treatment/Exercise - 08/09/21 0001       Neuro Re-ed    Neuro Re-ed Details  diaphragmatic breathing to elongate the pelvic floor and able to perform correctly and therapist able to feel the opening of the pelvic floor with tactile cues      Manual Therapy   Manual Therapy Myofascial release    Myofascial Release fascial release of the urogenital diaphragm; release of bilateral obturator internist; release of the area around the left side of the abdomen;  release of the suprapubic area where the bladder; release of the diaphragm                       PT Short Term Goals - 01/18/21 1221       PT SHORT TERM GOAL #1   Title independent with initial HEP with diaphragmatic breathing and stretches    Time 4    Period Weeks    Status Achieved    Target Date 01/04/21      PT SHORT TERM GOAL #2   Title understand on how to perform her own perineal massage to lengthen the pelvic floor muscles    Time 4    Period Weeks    Status Achieved               PT Long Term Goals - 08/09/21 1610       PT LONG TERM GOAL #1   Title independent with advanced HEP for pelvic floor strength    Baseline not able to due to her left lower quadrant pain    Time 12    Period Weeks    Status On-going      PT LONG TERM GOAL #2   Title urinary leakage decreased so she is wearing </= 1 pad per day    Baseline wears 2 pads per day but may be due to discharge    Time 12    Period Weeks    Status On-going      PT LONG TERM GOAL #3   Title pelvic floor strength is >/= 3/5 and able to fully relax so she has increased coordination of the muscles to prevent urinary leakage.    Time 12    Period Weeks    Status On-going      PT LONG TERM GOAL #4   Title able to cough, laugh, lift without leaking urine due to full contraction of the pelvic floor quickly with full relaxation    Baseline cough hard, possible lifting with full bladder    Time 12    Period Weeks    Status On-going                   Plan - 08/09/21 1607     Clinical Impression Statement Patient will leak with a strong cough or while she is walking to the bathroom. She will wear a pad when going out due to fear of needing it. Patient had  tightness along the suprapubic area and left abdominal area. After manual work she felt like she did not need to clench her pelvic floor and opened up on the anterior portion of her body. Patient will benefit from skilled therapy to  improve pelvic floor elongation and coordination to reduce leakage.    Personal Factors and Comorbidities Comorbidity 3+;Fitness;Sex    Comorbidities Fibromyalgia; COPD; C-section 1993; Hysterectomy 09/01/2018; Cervical lamenectomy    Examination-Activity Limitations Continence;Lift;Locomotion Level;Carry;Stand;Toileting    Examination-Participation Restrictions Community Activity;Cleaning;Meal Prep;Interpersonal Relationship    Stability/Clinical Decision Making Stable/Uncomplicated    Rehab Potential Excellent    PT Frequency 1x / week    PT Duration 8 weeks    PT Treatment/Interventions ADLs/Self Care Home Management;Cryotherapy;Moist Heat;Therapeutic activities;Therapeutic exercise;Neuromuscular re-education;Manual techniques;Patient/family education;Dry needling    PT Next Visit Plan quick contraction in lunge with trunk twist, release around the anterior trunk    PT Home Exercise Plan Access Code: [email or web address]    Consulted and Agree with Plan of Care Patient             Patient will benefit from skilled therapeutic intervention in order to improve the following deficits and impairments:  Decreased coordination, Increased fascial restricitons, Pain, Decreased endurance, Increased muscle spasms, Decreased activity tolerance, Decreased strength  Visit Diagnosis: Muscle weakness (generalized)  Other lack of coordination  Urinary incontinence, unspecified type     Problem List Patient Active Problem List   Diagnosis Date Noted   Hyperparathyroidism ([place]) 10/18/2020   Osteoporosis 10/18/2020   Concussion 10/27/2018   Postmenopausal bleeding 09/01/2018   MGUS (monoclonal gammopathy of unknown significance) 02/13/2018   COPD with emphysema ([place]) 10/04/2017   Palpitations 07/30/2017   Shortness of breath 07/30/2017   Hormone replacement therapy (HRT) 03/30/2017   Former smoker 03/30/2017   Other fatigue 06/30/2016   Insomnia 06/30/2016   DJD (degenerative joint disease),  cervical 06/30/2016   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/2016   IBS (irritable bowel syndrome) 06/30/2016   Interstitial cystitis 06/30/2016   SHOULDER PAIN, RIGHT 09/05/2010   GERD 03/24/2009   SACROILIAC STRAIN 03/24/2009   Atypical chest pain 01/20/2008   HEMATURIA UNSPECIFIED 10/14/2007   FIBROMYALGIA 07/03/2007   HYPERLIPIDEMIA NEC/NOS 05/16/2007   HEADACHE 05/09/2007    Earlie Counts, PT 08/09/21 4:12 PM  [facility] @ [place], [place], [address] Phone: [phone]   Fax:  [phone]  Name: Emily Ward MRN: [id number] Date of Birth: [birth year]

## 2021-08-09 NOTE — Telephone Encounter (Signed)
Last refill Zolpidem- 01/20/21  30 tabs, 5 refills Last OV- 02/07/21  Next OV- 09/08/21.   Can this patient receive a refill?

## 2021-08-11 ENCOUNTER — Other Ambulatory Visit: Payer: Self-pay | Admitting: Family Medicine

## 2021-08-11 DIAGNOSIS — R0789 Other chest pain: Secondary | ICD-10-CM

## 2021-08-16 ENCOUNTER — Ambulatory Visit: Payer: 59 | Admitting: Physical Therapy

## 2021-08-16 ENCOUNTER — Other Ambulatory Visit: Payer: Self-pay

## 2021-08-16 ENCOUNTER — Encounter: Payer: Self-pay | Admitting: Physical Therapy

## 2021-08-16 DIAGNOSIS — R278 Other lack of coordination: Secondary | ICD-10-CM

## 2021-08-16 DIAGNOSIS — M6281 Muscle weakness (generalized): Secondary | ICD-10-CM | POA: Diagnosis not present

## 2021-08-16 DIAGNOSIS — R32 Unspecified urinary incontinence: Secondary | ICD-10-CM

## 2021-08-16 NOTE — Therapy (Signed)
[place] @ [facility] [place], [place], [address] Phone: [phone]   Fax:  [phone]  Physical Therapy Treatment  Patient Details  Name: Emily Ward MRN: [id number] Date of Birth: [birth year] Referring Provider (PT): Dr. Hale Bogus   Encounter Date: 08/16/2021   PT End of Session - 08/16/21 1441     Visit Number 14    Date for PT Re-Evaluation 09/15/21    Authorization Type U[employer]    Authorization - Visit Number 14    Authorization - Number of Visits 60    PT Start Time 1400    PT Stop Time 1440    PT Time Calculation (min) 40 min    Activity Tolerance Patient tolerated treatment well;No increased pain    Behavior During Therapy [facility] for tasks assessed/performed             Past Medical History:  Diagnosis Date   Cervical disc disease 2006   COPD (chronic obstructive pulmonary disease) ([place])    Emphysema lung ([place])    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     Fibromyalgia    sees Dr. 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 Olevia Perches, benign polyp, repeat in 10 yrs    CYSTOSCOPY N/A 09/01/2018   Procedure: CYSTOSCOPY;  Surgeon: Megan Salon, MD;  Location: [facility];  Service: Gynecology;  Laterality: N/A;   DILATATION & CURETTAGE/HYSTEROSCOPY WITH MYOSURE N/A 04/12/2017   Procedure: DILATATION & CURETTAGE/HYSTEROSCOPY;  Surgeon: Megan Salon, MD;  Location: [facility];  Service: Gynecology;  Laterality: N/A;   facet joint block  03/30/2021   per patient, performed by Dr. Ernestina Patches   HYSTEROSCOPY      years ago with Dr Radene Knee-? [place] SALPINGECTOMY Bilateral 09/01/2018   Procedure: TOTAL LAPAROSCOPIC HYSTERECTOMY WITH SALPINGECTOMY  BSO, REPAIR OF PERINEAL LACERATION;  Surgeon: Megan Salon, MD;  Location: [facility];  Service: Gynecology;  Laterality: Bilateral;  possible BSO   URETHRAL DILATION      There were no vitals filed for this visit.   Subjective Assessment - 08/16/21 1405     Subjective Things are going well. I have not had leakage and feel more confident when I am out and about walking.    Patient Stated Goals reduce leakage    Currently in Pain? No/denies    Multiple Pain Sites No                [facility] PT Assessment - 08/16/21 0001       Assessment   Medical Diagnosis R32 urinary incontinence in female    Referring Provider (PT) Dr. 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    Leisure walking, would like to get into yoga      Cognition   Overall Cognitive Status Within Functional Limits for tasks assessed      Posture/Postural Control   Posture/Postural Control No significant limitations      Strength   Right Hip Flexion 5/5    Right Hip Extension 5/5    Right Hip External Rotation  5/5    Right Hip Internal Rotation 5/5    Left Hip Flexion 5/5    Left Hip External Rotation 5/5    Left Hip Internal Rotation 5/5    Left Hip ABduction 4+/5    Left Hip ADduction 5/5                        Pelvic Floor Special Questions - 08/16/21 0001     Urinary Leakage No    Urinary frequency able to empty her bladder                [facility] Adult PT Treatment/Exercise - 08/16/21 0001       Exercises   Exercises Other Exercises    Other Exercises  reviewed HEP and she understands it      Manual Therapy   Manual Therapy Myofascial release;Soft tissue mobilization    Manual therapy comments educated patient on manual work to the abdomen to do at home    Soft tissue mobilization manual work to the diaphragm and rectus    Myofascial Release fascial release around the umbilicus, along the middle of the upper abdomen, tissue rolling of the upper abdomen and suprapubically along the c-section scar                       PT Short Term Goals - 08/16/21 1443       PT SHORT TERM GOAL #1   Title independent with initial HEP with diaphragmatic breathing and stretches    Time 4    Period Weeks    Status Achieved      PT SHORT TERM GOAL #2   Title understand on how to perform her own perineal massage to lengthen the pelvic floor muscles    Time 4    Period Weeks    Status Achieved               PT Long Term Goals - 08/16/21 1444       PT LONG TERM GOAL #1   Title independent with advanced HEP for pelvic floor strength    Time 12    Period Weeks    Status Achieved      PT LONG TERM GOAL #2   Title urinary leakage decreased so she is wearing </= 1 pad per day    Time 12    Period Weeks    Status Achieved      PT LONG TERM GOAL #3   Title pelvic floor strength is >/= 3/5 and able to fully relax so she has increased coordination of the muscles to prevent urinary leakage.    Baseline deferred to so a finally pelvic floor strength test    Time 12    Period Weeks    Status Partially Met      PT LONG TERM GOAL #4  Title able to cough, laugh, lift without leaking urine due to full contraction of the pelvic floor quickly with full relaxation    Time 12    Period Weeks    Status Achieved      PT LONG TERM GOAL #5   Title educated on vaginal health to incorporate moisturizers and  lubricants to reduce dryness and friction with intercourse    Time 12    Period Weeks    Status Achieved                   Plan - 08/16/21 1441     Clinical Impression Statement Patient is independent with HEP. Patient is not having urinary leakage. Patient feels confident to go out of the home without a pad and leaking urine. She is able to walk and keep her pelvic floor relaxed. Patient has less fascial restrictions in the abdomen and understands how to perform her own soft tissue work. Patient is ready for discharge.    Personal Factors and Comorbidities Comorbidity 3+;Fitness;Sex    Comorbidities Fibromyalgia; COPD; C-section 1993; Hysterectomy 09/01/2018; Cervical lamenectomy    Examination-Participation Restrictions Community Activity;Cleaning;Meal Prep;Interpersonal Relationship    Stability/Clinical Decision Making Stable/Uncomplicated    Rehab Potential Excellent    PT Treatment/Interventions ADLs/Self Care Home Management;Cryotherapy;Moist Heat;Therapeutic activities;Therapeutic exercise;Neuromuscular re-education;Manual techniques;Patient/family education;Dry needling    PT Next Visit Plan [place] to HEP    PT Home Exercise Plan Access Code: [email or web address]    Consulted and Agree with Plan of Care Patient             Patient will benefit from skilled therapeutic intervention in order to improve the following deficits and impairments:  Decreased coordination, Increased fascial restricitons, Pain, Decreased endurance, Increased muscle spasms, Decreased activity tolerance, Decreased strength  Visit Diagnosis: Muscle weakness (generalized)  Other lack of coordination  Urinary incontinence, unspecified type     Problem List Patient Active Problem List   Diagnosis Date Noted   Hyperparathyroidism ([place]) 10/18/2020   Osteoporosis 10/18/2020   Concussion 10/27/2018   Postmenopausal bleeding 09/01/2018   MGUS (monoclonal gammopathy of unknown significance) 02/13/2018    COPD with emphysema ([place]) 10/04/2017   Palpitations 07/30/2017   Shortness of breath 07/30/2017   Hormone replacement therapy (HRT) 03/30/2017   Former smoker 03/30/2017   Other fatigue 06/30/2016   Insomnia 06/30/2016   DJD (degenerative joint disease), cervical 06/30/2016   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/2016   IBS (irritable bowel syndrome) 06/30/2016   Interstitial cystitis 06/30/2016   SHOULDER PAIN, RIGHT 09/05/2010   GERD 03/24/2009   SACROILIAC STRAIN 03/24/2009   Atypical chest pain 01/20/2008   HEMATURIA UNSPECIFIED 10/14/2007   FIBROMYALGIA 07/03/2007   HYPERLIPIDEMIA NEC/NOS 05/16/2007   HEADACHE 05/09/2007    Earlie Counts, PT 08/16/21 2:45 PM  [place] @ [place], [place], [address] Phone: [phone]   Fax:  [phone]  Name: Emily Ward MRN: [id number] Date of Birth: Jun 10, 1956 PHYSICAL THERAPY DISCHARGE SUMMARY  Visits from Start of Care: 14  Current functional level related to goals / functional outcomes: See above    Remaining deficits: See above.    Education / Equipment: HEP   Patient agrees to discharge. Patient goals were met. Patient is being discharged due to meeting the stated rehab goals. Thank you for the referral. Earlie Counts, PT 08/16/21 2:46 PM

## 2021-08-21 ENCOUNTER — Encounter: Payer: 59 | Admitting: Physical Therapy

## 2021-08-23 ENCOUNTER — Other Ambulatory Visit: Payer: Self-pay | Admitting: Family Medicine

## 2021-09-08 ENCOUNTER — Encounter: Payer: Self-pay | Admitting: Family Medicine

## 2021-09-08 ENCOUNTER — Ambulatory Visit (INDEPENDENT_AMBULATORY_CARE_PROVIDER_SITE_OTHER): Payer: 59 | Admitting: Family Medicine

## 2021-09-08 VITALS — BP 108/70 | HR 71 | Temp 98.5°F | Ht 65.0 in | Wt 113.0 lb

## 2021-09-08 DIAGNOSIS — Z23 Encounter for immunization: Secondary | ICD-10-CM | POA: Diagnosis not present

## 2021-09-08 DIAGNOSIS — Z0000 Encounter for general adult medical examination without abnormal findings: Secondary | ICD-10-CM | POA: Diagnosis not present

## 2021-09-08 LAB — BASIC METABOLIC PANEL
BUN: 22 mg/dL (ref 6–23)
CO2: 28 mEq/L (ref 19–32)
Calcium: 9.9 mg/dL (ref 8.4–10.5)
Chloride: 102 mEq/L (ref 96–112)
Creatinine, Ser: 0.7 mg/dL (ref 0.40–1.20)
GFR: 92.29 mL/min (ref >60.00)
Glucose, Bld: 93 mg/dL (ref 70–99)
Potassium: 4.2 mEq/L (ref 3.5–5.1)
Sodium: 137 mEq/L (ref 135–145)

## 2021-09-08 LAB — CBC WITH DIFFERENTIAL/PLATELET
Basophils Absolute: 0 10*3/uL (ref 0.0–0.1)
Basophils Relative: 0.7 % (ref 0.0–3.0)
Eosinophils Absolute: 0.2 10*3/uL (ref 0.0–0.7)
Eosinophils Relative: 4 % (ref 0.0–5.0)
HCT: 44 % (ref 36.0–46.0)
Hemoglobin: 14.5 g/dL (ref 12.0–15.0)
Lymphocytes Relative: 29 % (ref 12.0–46.0)
Lymphs Abs: 1.3 10*3/uL (ref 0.7–4.0)
MCHC: 33 g/dL (ref 30.0–36.0)
MCV: 97 fl (ref 78.0–100.0)
Monocytes Absolute: 0.4 10*3/uL (ref 0.1–1.0)
Monocytes Relative: 7.8 % (ref 3.0–12.0)
Neutro Abs: 2.7 10*3/uL (ref 1.4–7.7)
Neutrophils Relative %: 58.5 % (ref 43.0–77.0)
Platelets: 211 10*3/uL (ref 150.0–400.0)
RBC: 4.54 Mil/uL (ref 3.87–5.11)
RDW: 13.5 % (ref 11.5–15.5)
WBC: 4.6 10*3/uL (ref 4.0–10.5)

## 2021-09-08 LAB — HEPATIC FUNCTION PANEL
ALT: 11 U/L (ref 0–35)
AST: 24 U/L (ref 0–37)
Albumin: 4.4 g/dL (ref 3.5–5.2)
Alkaline Phosphatase: 66 U/L (ref 39–117)
Bilirubin, Direct: 0.1 mg/dL (ref 0.0–0.3)
Total Bilirubin: 0.4 mg/dL (ref 0.2–1.2)
Total Protein: 7.3 g/dL (ref 6.0–8.3)

## 2021-09-08 LAB — TSH: TSH: 1.97 u[IU]/mL (ref 0.35–5.50)

## 2021-09-08 LAB — LIPID PANEL
Cholesterol: 253 mg/dL — ABNORMAL HIGH (ref 0–200)
HDL: 92.3 mg/dL
LDL Cholesterol: 148 mg/dL — ABNORMAL HIGH (ref 0–99)
NonHDL: 160.28
Total CHOL/HDL Ratio: 3
Triglycerides: 60 mg/dL (ref 0.0–149.0)
VLDL: 12 mg/dL (ref 0.0–40.0)

## 2021-09-08 LAB — HEMOGLOBIN A1C: Hgb A1c MFr Bld: 5.5 % (ref 4.6–6.5)

## 2021-09-08 MED ORDER — DULOXETINE HCL 30 MG PO CPEP: 30.0000 mg | ORAL_CAPSULE | Freq: Every morning | ORAL | 0 refills | Status: DC

## 2021-09-08 NOTE — Progress Notes (Signed)
° °  Subjective:    Patient ID: Emily Ward, female    DOB: Jan 08, 1956, 66 y.o.   MRN: [id number]  HPI Here for a well exam. She feels great.    Review of Systems  Constitutional: Negative.   HENT: Negative.    Eyes: Negative.   Respiratory: Negative.    Cardiovascular: Negative.   Gastrointestinal: Negative.   Genitourinary:  Negative for decreased urine volume, difficulty urinating, dyspareunia, dysuria, enuresis, flank pain, frequency, hematuria, pelvic pain and urgency.  Musculoskeletal: Negative.   Skin: Negative.   Neurological: Negative.  Negative for headaches.  Psychiatric/Behavioral: Negative.        Objective:   Physical Exam Constitutional:      General: She is not in acute distress.    Appearance: Normal appearance. She is well-developed.  HENT:     Head: Normocephalic and atraumatic.     Right Ear: External ear normal.     Left Ear: External ear normal.     Nose: Nose normal.     Mouth/Throat:     Pharynx: No oropharyngeal exudate.  Eyes:     General: No scleral icterus.    Conjunctiva/sclera: Conjunctivae normal.     Pupils: Pupils are equal, round, and reactive to light.  Neck:     Thyroid: No thyromegaly.     Vascular: No JVD.  Cardiovascular:     Rate and Rhythm: Normal rate and regular rhythm.     Heart sounds: Normal heart sounds. No murmur heard.   No friction rub. No gallop.  Pulmonary:     Effort: Pulmonary effort is normal. No respiratory distress.     Breath sounds: Normal breath sounds. No wheezing or rales.  Chest:     Chest wall: No tenderness.  Abdominal:     General: Bowel sounds are normal. There is no distension.     Palpations: Abdomen is soft. There is no mass.     Tenderness: There is no abdominal tenderness. There is no guarding or rebound.  Musculoskeletal:        General: No tenderness. Normal range of motion.     Cervical back: Normal range of motion and neck supple.  Lymphadenopathy:     Cervical: No cervical  adenopathy.  Skin:    General: Skin is warm and dry.     Findings: No erythema or rash.  Neurological:     Mental Status: She is alert and oriented to person, place, and time.     Cranial Nerves: No cranial nerve deficit.     Motor: No abnormal muscle tone.     Coordination: Coordination normal.     Deep Tendon Reflexes: Reflexes are normal and symmetric. Reflexes normal.  Psychiatric:        Behavior: Behavior normal.        Thought Content: Thought content normal.        Judgment: Judgment normal.          Assessment & Plan:  Well exam. We discussed diet and exercise. Get  fasting labs.  Alysia Penna, MD

## 2021-09-11 ENCOUNTER — Other Ambulatory Visit: Payer: Self-pay | Admitting: Family Medicine

## 2021-09-13 NOTE — Telephone Encounter (Signed)
Last refill- 08/24/20-120 tabs, 5 refills Last OV- 09/08/21   Can this patient receive a refill?

## 2021-10-02 ENCOUNTER — Other Ambulatory Visit: Payer: Self-pay | Admitting: Obstetrics & Gynecology

## 2021-10-02 ENCOUNTER — Other Ambulatory Visit: Payer: Self-pay | Admitting: Internal Medicine

## 2021-10-02 DIAGNOSIS — Z1231 Encounter for screening mammogram for malignant neoplasm of breast: Secondary | ICD-10-CM

## 2021-10-20 ENCOUNTER — Other Ambulatory Visit: Payer: Self-pay

## 2021-10-20 ENCOUNTER — Ambulatory Visit (INDEPENDENT_AMBULATORY_CARE_PROVIDER_SITE_OTHER): Payer: 59 | Admitting: Obstetrics & Gynecology

## 2021-10-20 ENCOUNTER — Encounter (HOSPITAL_BASED_OUTPATIENT_CLINIC_OR_DEPARTMENT_OTHER): Payer: Self-pay | Admitting: Obstetrics & Gynecology

## 2021-10-20 VITALS — BP 105/72 | Ht 64.5 in | Wt 113.4 lb

## 2021-10-20 DIAGNOSIS — Z01419 Encounter for gynecological examination (general) (routine) without abnormal findings: Secondary | ICD-10-CM | POA: Diagnosis not present

## 2021-10-20 DIAGNOSIS — R32 Unspecified urinary incontinence: Secondary | ICD-10-CM | POA: Diagnosis not present

## 2021-10-20 DIAGNOSIS — M818 Other osteoporosis without current pathological fracture: Secondary | ICD-10-CM | POA: Diagnosis not present

## 2021-10-20 DIAGNOSIS — Z9229 Personal history of other drug therapy: Secondary | ICD-10-CM | POA: Diagnosis not present

## 2021-10-20 DIAGNOSIS — Z803 Family history of malignant neoplasm of breast: Secondary | ICD-10-CM

## 2021-10-20 MED ORDER — ESTRADIOL 1 MG PO TABS: 1.0000 mg | ORAL_TABLET | Freq: Every day | ORAL | 4 refills | Status: DC

## 2021-10-20 NOTE — Progress Notes (Signed)
66 y.o. G2P2 Married White or Caucasian female here for annual exam.  Doing well.  Denies vaginal bleeding.  Husband has retired as well as pt.    Patient's last menstrual period was 09/03/2000 (approximate).          The current method of family planning is status post hysterectomy.    Smoker:  no  Health Maintenance: Pap:  07/2018 neg with neg HR HPV History of abnormal Pap:  yes, remote hx MMG:  scheduled later this month Colonoscopy:  06/2012 BMD:   2022 Screening Labs: done 09/2021 with Dr. Sarajane Jews   reports that she quit smoking about 8 years ago. Her smoking use included cigarettes. She has a 20.00 pack-year smoking history. She has never used smokeless tobacco. She reports current alcohol use of about 1.0 standard drink per week. She reports that she does not use drugs.  Past Medical History:  Diagnosis Date   Cervical disc disease 2006   COPD (chronic obstructive pulmonary disease) ([place])    Emphysema lung ([place])    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     Fibromyalgia    sees Dr. 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   Hyperlipidemia    Migraines    MVP (mitral valve prolapse)    no daily medication   Peptic ulcer    no problems    Past Surgical History:  Procedure Laterality Date   CERVICAL LAMINECTOMY  2006   [place]  06/2002   CYSTOSCOPY N/A 09/01/2018   Procedure: CYSTOSCOPY;  Surgeon: Megan Salon, MD;  Location: [facility];  Service: Gynecology;  Laterality: N/A;   DILATATION & CURETTAGE/HYSTEROSCOPY WITH MYOSURE N/A 04/12/2017   Procedure: DILATATION & CURETTAGE/HYSTEROSCOPY;  Surgeon: Megan Salon, MD;  Location: [facility];  Service: Gynecology;  Laterality: N/A;   facet joint block  03/30/2021   per patient, performed by Dr. Ernestina Patches   HYSTEROSCOPY     years ago with Dr Radene Knee-? [place] SALPINGECTOMY Bilateral 09/01/2018   Procedure: TOTAL LAPAROSCOPIC HYSTERECTOMY WITH SALPINGECTOMY  BSO, REPAIR OF PERINEAL LACERATION;  Surgeon: Megan Salon, MD;  Location: [facility];  Service: Gynecology;  Laterality: Bilateral;  possible BSO   URETHRAL DILATION      Current Outpatient Medications  Medication Sig Dispense Refill   chlorzoxazone (PARAFON) 500 MG tablet TAKE 1 TABLET BY MOUTH FOUR TIMES DAILY AS NEEDED FOR MUSCLE SPASMS 120 tablet 5   albuterol (VENTOLIN HFA) 108 (90 Base) MCG/ACT inhaler Inhale 1-2 puffs into the lungs every 6 (six) hours as needed for wheezing or shortness of breath. 18 g 11   butalbital-acetaminophen-caffeine (FIORICET) 50-325-40 MG tablet TAKE 1 TABLET BY MOUTH EVERY 6 HOURS AS NEEDED 120 tablet 5   CALCIUM PO Take 1 tablet by mouth daily.     cetirizine (ZYRTEC) 10 MG tablet Take 10 mg by mouth daily as needed for allergies (spring time).     cholecalciferol (VITAMIN D) 1000 units tablet Take 2,000 Units by mouth daily.      Cyanocobalamin (VITAMIN B-12 PO) Take 1 tablet by mouth daily.      cyclobenzaprine (FLEXERIL) 10 MG tablet TAKE 1 TABLET BY MOUTH THREE TIMES DAILY AS NEEDED FOR MUSCLE SPASMS 270 tablet 1   denosumab (PROLIA) 60 MG/ML SOSY injection Inject 60 mg  into the skin every 6 (six) months.     DULoxetine (CYMBALTA) 30 MG capsule Take 1 capsule (30 mg total) by mouth every morning. 180 capsule 0   estradiol (ESTRACE) 1 MG tablet Take 1 tablet (1 mg total) by mouth daily. 90 tablet 4   meloxicam (MOBIC) 7.5 MG tablet TAKE 1 TABLET(7.5 MG) BY MOUTH DAILY 90 tablet 0   umeclidinium-vilanterol (ANORO ELLIPTA) 62.5-25 MCG/INH AEPB Inhale 1 puff into the lungs daily. 60 each 5   zolmitriptan (ZOMIG) 5 MG tablet TAKE 1 TABLET BY MOUTH AS NEEDED AS DIRECTED 30 tablet 5   zolpidem (AMBIEN) 10 MG tablet TAKE 1 TABLET(10 MG) BY MOUTH AT BEDTIME 30 tablet 5   No current facility-administered medications for this  visit.    Family History  Problem Relation Age of Onset   Fibrocystic breast disease Mother        lumpectomy in 50-60   Heart attack Father    Congestive Heart Failure Father    Diabetes type II Sister        and ? brother   Arthritis Son        psoriatic   Cancer Other        breast/fhx   Heart disease Other        fhx   Colon cancer Neg Hx    Esophageal cancer Neg Hx    Rectal cancer Neg Hx    Stomach cancer Neg Hx     Review of Systems  All other systems reviewed and are negative.  Exam:   BP 105/72    Ht 5' 4.5" (1.638 m)    Wt 113 lb 6.4 oz (51.4 kg)    LMP 09/03/2000 (Approximate)    BMI 19.16 kg/m   Height: 5' 4.5" (163.8 cm)  General appearance: alert, cooperative and appears stated age Breasts: normal appearance, no masses or tenderness Abdomen: soft, non-tender; bowel sounds normal; no masses,  no organomegaly Extremities: extremities normal, atraumatic, no cyanosis or edema Skin: Skin color, texture, turgor normal. No rashes or lesions Lymph nodes: Cervical, supraclavicular, and axillary nodes normal. No abnormal inguinal nodes palpated Neurologic: Grossly normal   Pelvic: External genitalia:  no lesions              Urethra:  normal appearing urethra with no masses, tenderness or lesions              Bartholins and Skenes: normal                 Vagina: normal appearing vagina with normal color and no discharge, no lesions              Cervix: absent              Pap taken: No. Bimanual Exam:  Uterus:  uterus absent              Adnexa: normal adnexa and no mass, fullness, tenderness               Rectovaginal: Confirms               Anus:  normal sphincter tone, no lesions  Chaperone, Octaviano Batty, CMA, was present for exam.  Assessment/Plan: 1. Well woman exam with routine gynecological exam - pap smear not indicated - MMG scheduled later this month - colonoscopy 06/2012 - lab work is done with Dr. Sarajane Jews - vaccines reviewed/updated  2. History  of postmenopausal HRT - estradiol (ESTRACE) 1 MG tablet; Take  1 tablet (1 mg total) by mouth daily.  Dispense: 90 tablet; Refill: 4  3. Other osteoporosis without current pathological fracture - on Prolia  4. Urinary incontinence in female - did pelvic PT last year and it helped so much.  May want new referral and knows to call if does  5. Family history of breast cancer

## 2021-10-23 ENCOUNTER — Ambulatory Visit: Payer: 59

## 2021-10-24 ENCOUNTER — Ambulatory Visit
Admission: RE | Admit: 2021-10-24 | Discharge: 2021-10-24 | Disposition: A | Discharge status: Home / Self Care | Payer: 59 | Source: Ambulatory Visit | Attending: Obstetrics & Gynecology | Admitting: Obstetrics & Gynecology

## 2021-10-24 ENCOUNTER — Other Ambulatory Visit: Payer: Self-pay

## 2021-10-24 DIAGNOSIS — Z1231 Encounter for screening mammogram for malignant neoplasm of breast: Secondary | ICD-10-CM

## 2021-10-26 ENCOUNTER — Other Ambulatory Visit: Payer: Self-pay | Admitting: Obstetrics & Gynecology

## 2021-10-26 DIAGNOSIS — R928 Other abnormal and inconclusive findings on diagnostic imaging of breast: Secondary | ICD-10-CM

## 2021-11-06 ENCOUNTER — Ambulatory Visit: Payer: 59

## 2021-11-06 ENCOUNTER — Ambulatory Visit
Admission: RE | Admit: 2021-11-06 | Discharge: 2021-11-06 | Disposition: A | Discharge status: Home / Self Care | Payer: 59 | Source: Ambulatory Visit | Attending: Obstetrics & Gynecology | Admitting: Obstetrics & Gynecology

## 2021-11-06 ENCOUNTER — Other Ambulatory Visit: Payer: Self-pay | Admitting: Obstetrics & Gynecology

## 2021-11-06 DIAGNOSIS — R928 Other abnormal and inconclusive findings on diagnostic imaging of breast: Secondary | ICD-10-CM

## 2021-11-06 DIAGNOSIS — R921 Mammographic calcification found on diagnostic imaging of breast: Secondary | ICD-10-CM

## 2021-11-30 ENCOUNTER — Other Ambulatory Visit (HOSPITAL_BASED_OUTPATIENT_CLINIC_OR_DEPARTMENT_OTHER): Payer: Self-pay | Admitting: Obstetrics & Gynecology

## 2021-11-30 ENCOUNTER — Encounter (HOSPITAL_BASED_OUTPATIENT_CLINIC_OR_DEPARTMENT_OTHER): Payer: Self-pay | Admitting: Obstetrics & Gynecology

## 2021-11-30 DIAGNOSIS — N393 Stress incontinence (female) (male): Secondary | ICD-10-CM

## 2021-12-05 ENCOUNTER — Other Ambulatory Visit: Payer: Self-pay | Admitting: Pulmonary Disease

## 2021-12-05 ENCOUNTER — Other Ambulatory Visit: Payer: Self-pay | Admitting: Family Medicine

## 2021-12-05 DIAGNOSIS — R0789 Other chest pain: Secondary | ICD-10-CM

## 2021-12-12 NOTE — Progress Notes (Signed)
? ?Office Visit Note ? ?Patient: Emily Ward             ?Date of Birth: May 16, 1956           ?MRN: [id number]             ?PCP: Laurey Morale, MD ?Referring: Laurey Morale, MD ?Visit Date: 12/26/2021 ?Occupation: _0 @ ? ?Subjective:  ?Neck and thoracic pain ? ?History of Present Illness: Legacy Carrender is a 66 y.o. female with history of fibromyalgia, osteoarthritis and degenerative disc disease.  She states she has been trying to taper her Cymbalta and she is currently on 30 mg p.o. daily.  She would like to decrease the dose of Cymbalta down to 20 mg p.o. daily.  She continues to have discomfort in her bilateral hands, cervical and thoracic spine.  She states the thoracic spine pain has increased.  She has had cervical spine injection by Dr. Ernestina Patches in the past.  She states she is having recurrence of the pain with left-sided radiculopathy.  She plans to schedule an appointment with Dr. Ernestina Patches.  She continues to have some generalized pain and discomfort from fibromyalgia.  She has a stiffness in her hands but she has not noticed any joint swelling. ? ?Activities of Daily Living:  ?Patient reports morning stiffness for all day. ?Patient Denies nocturnal pain.  ?Difficulty dressing/grooming: Denies ?Difficulty climbing stairs: Denies ?Difficulty getting out of chair: Denies ?Difficulty using hands for taps, buttons, cutlery, and/or writing: Reports ? ?Review of Systems  ?Constitutional:  Positive for fatigue.  ?HENT:  Positive for mouth sores, mouth dryness and nose dryness.   ?Eyes:  Negative for pain, itching and dryness.  ?Respiratory:  Negative for difficulty breathing.   ?Cardiovascular:  Negative for chest pain and palpitations.  ?Gastrointestinal:  Negative for blood in stool, constipation and diarrhea.  ?Endocrine: Negative for increased urination.  ?Genitourinary:  Positive for involuntary urination. Negative for difficulty urinating.  ?Musculoskeletal:  Positive for joint pain,  joint pain, myalgias, morning stiffness, muscle tenderness and myalgias. Negative for joint swelling.  ?Skin:  Negative for color change, rash and redness.  ?Allergic/Immunologic: Negative for susceptible to infections.  ?Neurological:  Positive for headaches and weakness. Negative for dizziness, numbness and memory loss.  ?Hematological:  Positive for bruising/bleeding tendency.  ?Psychiatric/Behavioral:  Negative for confusion.   ? ?PMFS History:  ?Patient Active Problem List  ? Diagnosis Date Noted  ? Hyperparathyroidism ([place]) 10/18/2020  ? Osteoporosis 10/18/2020  ? Concussion 10/27/2018  ? Postmenopausal bleeding 09/01/2018  ? MGUS (monoclonal gammopathy of unknown significance) 02/13/2018  ? 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 Palpitations 07/30/2017  ? Shortness of breath 07/30/2017  ? Hormone replacement therapy (HRT) 03/30/2017  ? Former smoker 03/30/2017  ? Other fatigue 06/30/2016  ? Insomnia 06/30/2016  ? DJD (degenerative joint disease), cervical 06/30/2016  ? 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/2016  ? IBS (irritable bowel syndrome) 06/30/2016  ? Interstitial cystitis 06/30/2016  ? SHOULDER PAIN, RIGHT 09/05/2010  ? GERD 03/24/2009  ? SACROILIAC STRAIN 03/24/2009  ? Atypical chest pain 01/20/2008  ? HEMATURIA UNSPECIFIED 10/14/2007  ? FIBROMYALGIA 07/03/2007  ? HYPERLIPIDEMIA NEC/NOS 05/16/2007  ? HEADACHE 05/09/2007  ?  ?Past Medical History:  ?Diagnosis Date  ? Cervical disc disease 2006  ? COPD (chronic obstructive pulmonary disease) ([place])   ? Emphysema lung ([place])   ? 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 with second pregnancy.  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    ?  Fibromyalgia   ? sees Dr. Estanislado Pandy   ? Heart murmur   ? 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 Hyperlipidemia   ? Migraines   ? MVP (mitral valve prolapse)   ? no daily medication  ? Peptic ulcer   ? no problems  ?  ?Family History  ?Problem Relation Age of Onset  ? Fibrocystic breast disease Mother   ?      lumpectomy in 50-60  ? Heart attack Father   ? Congestive Heart Failure Father   ? Diabetes type II Sister   ?     and ? brother  ? Arthritis Son   ?     psoriatic  ? Cancer Other   ?     breast/fhx  ? Heart disease Other   ?     fhx  ? Colon cancer Neg Hx   ? Esophageal cancer Neg Hx   ? Rectal cancer Neg Hx   ? Stomach cancer Neg Hx   ? ?Past Surgical History:  ?Procedure Laterality Date  ? CERVICAL LAMINECTOMY  2006  ? [place]  ? CHOLECYSTECTOMY  06/2002  ? CYSTOSCOPY N/A 09/01/2018  ? Procedure: CYSTOSCOPY;  Surgeon: Megan Salon, MD;  Location: [facility];  Service: Gynecology;  Laterality: N/A;  ? DILATATION & CURETTAGE/HYSTEROSCOPY WITH MYOSURE N/A 04/12/2017  ? Procedure: DILATATION & CURETTAGE/HYSTEROSCOPY;  Surgeon: Megan Salon, MD;  Location: [facility];  Service: Gynecology;  Laterality: N/A;  ? facet joint block  03/30/2021  ? per patient, performed by Dr. Ernestina Patches  ? HYSTEROSCOPY    ? years ago with Dr Radene Knee-? 2000  ? TOTAL LAPAROSCOPIC HYSTERECTOMY WITH SALPINGECTOMY Bilateral 09/01/2018  ? Procedure: TOTAL LAPAROSCOPIC HYSTERECTOMY WITH SALPINGECTOMY  BSO, REPAIR OF PERINEAL LACERATION;  Surgeon: Megan Salon, MD;  Location: [place];  Service: Gynecology;  Laterality: Bilateral;  possible BSO  ? URETHRAL DILATION    ? ?Social History  ? ?Social History Narrative  ? Not on file  ? ?Immunization History  ?Administered Date(s) Administered  ? Influenza Whole 06/30/2007, 05/04/2010  ? Influenza,inj,Quad PF,6+ Mos 07/12/2017, 05/23/2018, 09/08/2021  ? Influenza-Unspecified 06/02/2014, 06/03/2016, 06/20/2020  ? MMR 06/30/2007  ? Moderna Sars-Covid-2 Vaccination 09/07/2019, 10/12/2019, 07/13/2020, 02/21/2021  ? Pneumococcal Polysaccharide-23 03/23/2019  ? Td 09/03/1998, 05/05/2009  ? Tdap 10/22/2018  ?  ? ?Objective: ?Vital Signs: BP 114/78 (BP Location: Left Arm, Patient Position: Sitting, Cuff Size: Normal)   Pulse 75   Ht 5'  4.75" (1.645 m)   Wt 111 lb 6.4 oz (50.5 kg)   LMP 09/03/2000 (Approximate)   BMI 18.68 kg/m?   ? ?Physical Exam ?Vitals and nursing note reviewed.  ?Constitutional:   ?   Appearance: She is well-developed.  ?HENT:  ?   Head: Normocephalic and atraumatic.  ?Eyes:  ?   Conjunctiva/sclera: Conjunctivae normal.  ?Cardiovascular:  ?   Rate and Rhythm: Normal rate and regular rhythm.  ?   Heart sounds: Normal heart sounds.  ?Pulmonary:  ?   Effort: Pulmonary effort is normal.  ?   Breath sounds: Normal breath sounds.  ?Abdominal:  ?   General: Bowel sounds are normal.  ?   Palpations: Abdomen is soft.  ?Musculoskeletal:  ?   Cervical back: Normal range of motion.  ?Lymphadenopathy:  ?   Cervical: No cervical adenopathy.  ?Skin: ?   General: Skin is warm and dry.  ?   Capillary Refill: Capillary refill takes  less than 2 seconds.  ?Neurological:  ?   Mental Status: She is alert and oriented to person, place, and time.  ?Psychiatric:     ?   Behavior: Behavior normal.  ?  ? ?Musculoskeletal Exam: She has stiffness with range of motion of her cervical spine.  She had discomfort range of motion of her thoracic spine with some tenderness over C5-C6 region.  Lumbar spine was in good range of motion.  Shoulder joints, elbow joints, wrist joints, MCPs PIPs and DIPs with good range of motion.  No synovitis was noted.  Hip joints, knee joints, ankles, MTPs and PIPs with good range of motion without any synovitis. ? ?CDAI Exam: ?CDAI Score: -- ?Patient Global: --; Provider Global: -- ?Swollen: --; Tender: -- ?Joint Exam 12/26/2021  ? ?No joint exam has been documented for this visit  ? ?There is currently no information documented on the homunculus. Go to the Rheumatology activity and complete the homunculus joint exam. ? ?Investigation: ?No additional findings. ? ?Imaging: ?No results found. ? ?Recent Labs: ?Lab Results  ?Component Value Date  ? WBC 4.6 09/08/2021  ? HGB 14.5 09/08/2021  ? PLT 211.0 09/08/2021  ? NA 137  09/08/2021  ? K 4.2 09/08/2021  ? CL 102 09/08/2021  ? CO2 28 09/08/2021  ? GLUCOSE 93 09/08/2021  ? BUN 22 09/08/2021  ? CREATININE 0.70 09/08/2021  ? BILITOT 0.4 09/08/2021  ? ALKPHOS 66 09/08/2021  ? AST 24 09/08/18

## 2021-12-13 ENCOUNTER — Ambulatory Visit: Payer: 59 | Attending: Obstetrics & Gynecology | Admitting: Physical Therapy

## 2021-12-13 ENCOUNTER — Encounter: Payer: Self-pay | Admitting: Physical Therapy

## 2021-12-13 DIAGNOSIS — R279 Unspecified lack of coordination: Secondary | ICD-10-CM | POA: Diagnosis not present

## 2021-12-13 DIAGNOSIS — R293 Abnormal posture: Secondary | ICD-10-CM | POA: Diagnosis not present

## 2021-12-13 DIAGNOSIS — M6281 Muscle weakness (generalized): Secondary | ICD-10-CM | POA: Insufficient documentation

## 2021-12-13 DIAGNOSIS — N393 Stress incontinence (female) (male): Secondary | ICD-10-CM | POA: Insufficient documentation

## 2021-12-13 NOTE — Therapy (Signed)
?OUTPATIENT PHYSICAL THERAPY FEMALE PELVIC EVALUATION ? ? ?Patient Name: Emily Ward ?MRN: [id number] ?DOB:12/20/1955, 65 y.o., female ?Today's Date: 12/13/2021 ? ? PT End of Session - 12/13/21 1441   ? ? Visit Number 1   ? Date for PT Re-Evaluation 03/14/22   ? Authorization Type U[employer]   ? PT Start Time 1400   ? PT Stop Time 1440   ? PT Time Calculation (min) 40 min   ? Activity Tolerance Patient tolerated treatment well   ? Behavior During Therapy [facility] for tasks assessed/performed   ? ?  ?  ? ?  ? ? ?Past Medical History:  ?Diagnosis Date  ? Cervical disc disease 2006  ? COPD (chronic obstructive pulmonary disease) ([place])   ? Emphysema lung ([place])   ? 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 with second pregnancy.  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    ? Fibromyalgia   ? sees Dr. Estanislado Pandy   ? Heart murmur   ? 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 Hyperlipidemia   ? Migraines   ? MVP (mitral valve prolapse)   ? no daily medication  ? Peptic ulcer   ? no problems  ? ?Past Surgical History:  ?Procedure Laterality Date  ? CERVICAL LAMINECTOMY  2006  ? [place]  ? CHOLECYSTECTOMY  06/2002  ? CYSTOSCOPY N/A 09/01/2018  ? Procedure: CYSTOSCOPY;  Surgeon: Megan Salon, MD;  Location: [facility];  Service: Gynecology;  Laterality: N/A;  ? DILATATION & CURETTAGE/HYSTEROSCOPY WITH MYOSURE N/A 04/12/2017  ? Procedure: DILATATION & CURETTAGE/HYSTEROSCOPY;  Surgeon: Megan Salon, MD;  Location[facility];  Service: Gynecology;  Laterality: N/A;  ? facet joint block  03/30/2021  ? per patient, performed by Dr. Ernestina Patches  ? HYSTEROSCOPY    ? years ago with Dr Radene Knee-? 2000  ? TOTAL LAPAROSCOPIC HYSTERECTOMY WITH SALPINGECTOMY Bilateral 09/01/2018  ? Procedure: TOTAL LAPAROSCOPIC HYSTERECTOMY WITH SALPINGECTOMY  BSO, REPAIR OF PERINEAL LACERATION;  Surgeon: Megan Salon, MD;  Location: [place];  Service: Gynecology;  Laterality: Bilateral;  possible BSO  ? URETHRAL  DILATION    ? ?Patient Active Problem List  ? Diagnosis Date Noted  ? Hyperparathyroidism ([place]) 10/18/2020  ? Osteoporosis 10/18/2020  ? Concussion 10/27/2018  ? Postmenopausal bleeding 09/01/2018  ? MGUS (monoclonal gammopathy of unknown significance) 02/13/2018  ? 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 Palpitations 07/30/2017  ? Shortness of breath 07/30/2017  ? Hormone replacement therapy (HRT) 03/30/2017  ? Former smoker 03/30/2017  ? Other fatigue 06/30/2016  ? Insomnia 06/30/2016  ? DJD (degenerative joint disease), cervical 06/30/2016  ? 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/2016  ? IBS (irritable bowel syndrome) 06/30/2016  ? Interstitial cystitis 06/30/2016  ? SHOULDER PAIN, RIGHT 09/05/2010  ? GERD 03/24/2009  ? SACROILIAC STRAIN 03/24/2009  ? Atypical chest pain 01/20/2008  ? HEMATURIA UNSPECIFIED 10/14/2007  ? FIBROMYALGIA 07/03/2007  ? HYPERLIPIDEMIA NEC/NOS 05/16/2007  ? HEADACHE 05/09/2007  ? ? ?PCP: Laurey Morale, MD ? ?REFERRING PROVIDER: Megan Salon, MD ? ?REFERRING DIAG: N39.3 (ICD-10-CM) - Stress incontinence of urine ? ?THERAPY DIAG:  ?Muscle weakness (generalized) ? ?Stress incontinence of urine ? ?Abnormal posture ? ?Unspecified lack of coordination ? ?Stress incontinence (female) (female) ? ?ONSET DATE: since hysterectomy however improved with PFPT which she was discharged from 08/16/21 however since february 2023 symptoms have returned.  ? ?SUBJECTIVE:                                                                                                                                                                                          ? ?  SUBJECTIVE STATEMENT: ?Pt reports she has had return of urinary leakage since February going from no leakage to now needing 4-5 pads per day. Pt reports leakage with all mobility or sneezing/laughing/coughing. Pt also reports she will have leakage  with feeling like she needs to have BM.  ? ?Fluid intake: Yes: 70 oz    ? ?Patient confirms identification and approves PT to assess pelvic floor and treatment Yes ? ? ?PAIN:  ?Are you having pain? Yes ?NPRS scale: 3/10 ?Pain location:  cervical spine, bil shoulders constantly  ? ? ? ?PRECAUTIONS: None ? ?WEIGHT BEARING RESTRICTIONS No ? ?FALLS:  ?Has patient fallen in last 6 months? No ? ?LIVING ENVIRONMENT: ?Lives with: lives with their spouse ?Lives in: House/apartment ? ? ?OCCUPATION: retired  ? ?PLOF: Independent ? ?PATIENT GOALS to have less leakage ? ?PERTINENT HISTORY:  ?COPD, cervical disc disease, emphysema, fibromyalgia, heart murmur, hepatitis, HLD, MVP, migraines, CERVICAL LAMINECTOMY, CESAREAN SECTION, CHOLECYSTECTOMY, HYSTEROSCOPY, URETHRAL DILATION ?Sexual abuse: No ? ?BOWEL MOVEMENT ?Pain with bowel movement: No ?Type of bowel movement:Type (Bristol Stool Scale) 4, Frequency daily, and Strain No ?Fully empty rectum: Yes:   ?Leakage: No ?Pads: No ?Fiber supplement: No ? ?URINATION ?Pain with urination: No ?Fully empty bladder: No ?Stream: Strong ?Urgency: No ?Frequency: every 45 mins - 1 hour ?Leakage: Coughing, Sneezing, Laughing, Exercise, and Lifting ?Pads: Yes: 4-5x per day, does not get up at night ? ?INTERCOURSE ?Pain with intercourse:  not painful ? ? ?PREGNANCY ?Vaginal deliveries 1 passed away during birth ?[place] No ?C-section deliveries 1 (twin, brother passed at birth) ?Currently pregnant No ? ?PROLAPSE ?None ? ? ? ?OBJECTIVE:  ? ?DIAGNOSTIC FINDINGS:  ? ? ?COGNITION: ? Overall cognitive status: Within functional limits for tasks assessed   ?  ?SENSATION: ? Light touch: Appears intact ? Proprioception: Appears intact ? ?MUSCLE LENGTH: ?Bil hamstrings and adductors limited by 25% ? ? ? ? ?POSTURE:  ?Rounded shoulders, posterior pelvic tilt ? ?LUMBARAROM/PROM ? ?Bil side bending and rotation limited by 25% ? ?LE ROM: ?Bil LE WFL ? ?LE MMT: ? ?Bil hip abduction 3/5; all other hips 3+/5; knees 4/5, ankles 5/5 ? ?PELVIC MMT: ?  ?MMT  ?12/13/2021  ?Vaginal 2/5; 4s  holds; 4 reps  ?Internal Anal Sphincter   ?External Anal Sphincter   ?Puborectalis   ?Diastasis Recti   ?(Blank rows = not tested) ? ?      PALPATION: ?  General  tightness felt at bil upper abdominal quadrants and at diaphragm  ? ?              External Perineal Exam WFL ?              ?              Internal Pelvic Floor no TTP ? ?TONE: ?Mildly increased ? ?PROLAPSE: ?None in hooklying ? ?TODAY'S TREATMENT  ?EVAL 12/13/2021 Examination completed, findings reviewed, pt educated on POC, HEP. Pt motivated to participate in PT and agreeable to attempt recommendations.  ? ? ? ?PATIENT EDUCATION:  ?Education details: [id number] ?Person educated: Patient ?Education method: Explanation, Demonstration, Tactile cues, Verbal cues, and Handouts ?Education comprehension: verbalized understanding and returned demonstration ? ? ?HOME EXERCISE PROGRAM: ?[id number] ? ?ASSESSMENT: ? ?CLINICAL IMPRESSION: ?Patient is a 66 y.o. female  who was seen today for physical therapy evaluation and treatment for urinary incontinence with stressors. Pt found to have decreased strength globally, decreased flexibility with hips and spine and restrictions in abdomen. Pt consented to internal vaginal assessment,  found to have decreased strength, endurance, and coordination. Pt would benefit from additional PT to further address deficits.   ? ? ?OBJECTIVE IMPAIRMENTS decreased coordination, decreased endurance, decreased strength, increased fascial restrictions, impaired flexibility, improper body mechanics, and postural dysfunction.  ? ?ACTIVITY LIMITATIONS community activity.  ? ?PERSONAL FACTORS 1 comorbidity: vaginal/c-section history  are also affecting patient's functional outcome.  ? ? ?REHAB POTENTIAL: Good ? ?CLINICAL DECISION MAKING: Stable/uncomplicated ? ?EVALUATION COMPLEXITY: Low ? ? ?GOALS: ?Goals reviewed with patient? Yes ? ?SHORT TERM GOALS: Target date: 01/10/2022 ? ?Pt to be I with HEP. ?Baseline: ?Goal status: INITIAL ? ?2.  Pt to  report no more than 3 pads used per day to demonstrate decreased leakage amounts.  ?Baseline:  ?Goal status: INITIAL ? ?3.  Pt to report good voiding mechanics without cues I to improve ability to fully empty

## 2021-12-18 ENCOUNTER — Encounter: Payer: 59 | Admitting: Physical Therapy

## 2021-12-26 ENCOUNTER — Telehealth: Payer: Self-pay | Admitting: Pharmacist

## 2021-12-26 ENCOUNTER — Other Ambulatory Visit (HOSPITAL_COMMUNITY): Payer: Self-pay

## 2021-12-26 ENCOUNTER — Ambulatory Visit: Payer: 59 | Admitting: Rheumatology

## 2021-12-26 ENCOUNTER — Ambulatory Visit (INDEPENDENT_AMBULATORY_CARE_PROVIDER_SITE_OTHER): Payer: 59

## 2021-12-26 ENCOUNTER — Telehealth: Payer: Self-pay | Admitting: Family Medicine

## 2021-12-26 ENCOUNTER — Encounter: Payer: Self-pay | Admitting: Rheumatology

## 2021-12-26 VITALS — BP 114/78 | HR 75 | Ht 64.75 in | Wt 111.4 lb

## 2021-12-26 DIAGNOSIS — M5030 Other cervical disc degeneration, unspecified cervical region: Secondary | ICD-10-CM

## 2021-12-26 DIAGNOSIS — M810 Age-related osteoporosis without current pathological fracture: Secondary | ICD-10-CM

## 2021-12-26 DIAGNOSIS — M79642 Pain in left hand: Secondary | ICD-10-CM

## 2021-12-26 DIAGNOSIS — M546 Pain in thoracic spine: Secondary | ICD-10-CM

## 2021-12-26 DIAGNOSIS — F5101 Primary insomnia: Secondary | ICD-10-CM

## 2021-12-26 DIAGNOSIS — M797 Fibromyalgia: Secondary | ICD-10-CM

## 2021-12-26 DIAGNOSIS — N3010 Interstitial cystitis (chronic) without hematuria: Secondary | ICD-10-CM

## 2021-12-26 DIAGNOSIS — M62838 Other muscle spasm: Secondary | ICD-10-CM | POA: Diagnosis not present

## 2021-12-26 DIAGNOSIS — Z79899 Other long term (current) drug therapy: Secondary | ICD-10-CM

## 2021-12-26 DIAGNOSIS — M79641 Pain in right hand: Secondary | ICD-10-CM

## 2021-12-26 DIAGNOSIS — Z8719 Personal history of other diseases of the digestive system: Secondary | ICD-10-CM

## 2021-12-26 DIAGNOSIS — M5136 Other intervertebral disc degeneration, lumbar region: Secondary | ICD-10-CM

## 2021-12-26 DIAGNOSIS — M26609 Unspecified temporomandibular joint disorder, unspecified side: Secondary | ICD-10-CM

## 2021-12-26 NOTE — Telephone Encounter (Signed)
Patient called in to have Cymbalta lowered from 30 mg to 20 mg. Patient states she is not sure as to who or how the dosage got lowered... ? ?She would like the RX sent with the correct dosage. ?

## 2021-12-26 NOTE — Telephone Encounter (Signed)
Patient transitioning to our clinic for Prolia management. Her last dose was 07/31/21. ? ?Per test claim through pharmacy benefit, it is plan/benefit exclusion. ?Will likely have to receive through medical benefit ? ?She will need labs drawn in 1-2 weeks prior to next Prolia injection. ? ?Infusion orders can be placed with [facility] once labs result ? ?Knox Saliva, PharmD, MPH, BCPS ?Clinical Pharmacist (Rheumatology and Pulmonology) ?

## 2021-12-26 NOTE — Patient Instructions (Signed)

## 2021-12-27 MED ORDER — DULOXETINE HCL 20 MG PO CPEP: 20.0000 mg | ORAL_CAPSULE | Freq: Every day | ORAL | 3 refills | Status: DC

## 2021-12-27 NOTE — Telephone Encounter (Signed)
I sent in a new RX for the 20 mg dose  ?

## 2021-12-27 NOTE — Telephone Encounter (Signed)
Prescription sent to [facility] on [address].   Patient aware ?

## 2021-12-28 ENCOUNTER — Telehealth: Payer: Self-pay | Admitting: Physical Medicine and Rehabilitation

## 2021-12-28 ENCOUNTER — Ambulatory Visit: Payer: 59 | Admitting: Physical Therapy

## 2021-12-28 DIAGNOSIS — R293 Abnormal posture: Secondary | ICD-10-CM

## 2021-12-28 DIAGNOSIS — M6281 Muscle weakness (generalized): Secondary | ICD-10-CM

## 2021-12-28 DIAGNOSIS — R279 Unspecified lack of coordination: Secondary | ICD-10-CM

## 2021-12-28 DIAGNOSIS — N393 Stress incontinence (female) (male): Secondary | ICD-10-CM | POA: Diagnosis not present

## 2021-12-28 NOTE — Therapy (Signed)
?OUTPATIENT PHYSICAL THERAPY TREATMENT NOTE ? ? ?Patient Name: Emily Ward ?MRN: [id number] ?DOB:1956-05-14, 66 y.o., female ?Today's Date: 12/28/2021 ? ?PCP: Laurey Morale, MD ?REFERRING PROVIDER: Megan Salon, MD ? ?END OF SESSION:  ? PT End of Session - 12/28/21 1525   ? ? Visit Number 2   ? Date for PT Re-Evaluation 03/14/22   ? Authorization Type UHC   ? PT Start Time 8366   ? PT Stop Time 2947   ? PT Time Calculation (min) 42 min   ? Activity Tolerance Patient tolerated treatment well   ? Behavior During Therapy [facility] for tasks assessed/performed   ? ?  ?  ? ?  ? ? ?Past Medical History:  ?Diagnosis Date  ? Cervical disc disease 2006  ? COPD (chronic obstructive pulmonary disease) ([place])   ? Emphysema lung ([place])   ? 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 with second pregnancy.  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    ? Fibromyalgia   ? sees Dr. Estanislado Pandy   ? Heart murmur   ? 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 Hyperlipidemia   ? Migraines   ? MVP (mitral valve prolapse)   ? no daily medication  ? Peptic ulcer   ? no problems  ? ?Past Surgical History:  ?Procedure Laterality Date  ? CERVICAL LAMINECTOMY  2006  ? [place]  ? CHOLECYSTECTOMY  06/2002  ? CYSTOSCOPY N/A 09/01/2018  ? Procedure: CYSTOSCOPY;  Surgeon: Megan Salon, MD;  Location: [facility];  Service: Gynecology;  Laterality: N/A;  ? DILATATION & CURETTAGE/HYSTEROSCOPY WITH MYOSURE N/A 04/12/2017  ? Procedure: DILATATION & CURETTAGE/HYSTEROSCOPY;  Surgeon: Megan Salon, MD;  Location: [facility];  Service: Gynecology;  Laterality: N/A;  ? facet joint block  03/30/2021  ? per patient, performed by Dr. Ernestina Patches  ? HYSTEROSCOPY    ? years ago with Dr Radene Knee-? 2000  ? TOTAL LAPAROSCOPIC HYSTERECTOMY WITH SALPINGECTOMY Bilateral 09/01/2018  ? Procedure: TOTAL LAPAROSCOPIC HYSTERECTOMY WITH SALPINGECTOMY  BSO, REPAIR OF PERINEAL LACERATION;  Surgeon: Megan Salon, MD;  Location: [place];   Service: Gynecology;  Laterality: Bilateral;  possible BSO  ? URETHRAL DILATION    ? ?Patient Active Problem List  ? Diagnosis Date Noted  ? Hyperparathyroidism ([place]) 10/18/2020  ? Osteoporosis 10/18/2020  ? Concussion 10/27/2018  ? Postmenopausal bleeding 09/01/2018  ? MGUS (monoclonal gammopathy of unknown significance) 02/13/2018  ? COPD with emphysema ([place]) 10/04/2017  ? Palpitations 07/30/2017  ? Shortness of breath 07/30/2017  ? Hormone replacement therapy (HRT) 03/30/2017  ? Former smoker 03/30/2017  ? Other fatigue 06/30/2016  ? Insomnia 06/30/2016  ? DJD (degenerative joint disease), cervical 06/30/2016  ? 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/2016  ? IBS (irritable bowel syndrome) 06/30/2016  ? Interstitial cystitis 06/30/2016  ? SHOULDER PAIN, RIGHT 09/05/2010  ? GERD 03/24/2009  ? SACROILIAC STRAIN 03/24/2009  ? Atypical chest pain 01/20/2008  ? HEMATURIA UNSPECIFIED 10/14/2007  ? FIBROMYALGIA 07/03/2007  ? HYPERLIPIDEMIA NEC/NOS 05/16/2007  ? HEADACHE 05/09/2007  ? ? ?REFERRING DIAG: N39.3 (ICD-10-CM) - Stress incontinence of urine ? ?THERAPY DIAG:  ?Muscle weakness (generalized) ? ?Abnormal posture ? ?Unspecified lack of coordination ? ?PERTINENT HISTORY: COPD, cervical disc disease, emphysema, fibromyalgia, heart murmur, hepatitis, HLD, MVP, migraines, CERVICAL LAMINECTOMY, CESAREAN SECTION, CHOLECYSTECTOMY, HYSTEROSCOPY, URETHRAL DILATION ?Sexual abuse: No ? ?PRECAUTIONS: osteoporosis, hepatitis  ? ?SUBJECTIVE: pt reports she thinks she is a fibro flare  up and pain has been high globally. Pt reports she was seeing improvement with use of HEP prior to this flare but has stopped due to pain. Pt worse right now at Lt side of neck.  ? ?PAIN:  ?Are you having pain? Yes: NPRS scale: 8/10 ?Pain location: globally but worse at Lt side ?Pain description: constant dull ache  ?Aggravating factors: mobility ?Relieving factors: heat, rubbing it helps a little bit ? ? ?OBJECTIVE:  (objective measures completed at initial evaluation unless otherwise dated) ? ? ?OBJECTIVE:  ?  ?DIAGNOSTIC FINDINGS:  ?  ?  ?COGNITION: ?           Overall cognitive status: Within functional limits for tasks assessed              ?            ?SENSATION: ?           Light touch: Appears intact ?           Proprioception: Appears intact ?  ?MUSCLE LENGTH: ?Bil hamstrings and adductors limited by 25% ?  ?  ?POSTURE:  ?Rounded shoulders, posterior pelvic tilt ?  ?LUMBARAROM/PROM ?  ?Bil side bending and rotation limited by 25% ?  ?LE ROM: ?Bil LE WFL ?  ?LE MMT: ?  ?Bil hip abduction 3/5; all other hips 3+/5; knees 4/5, ankles 5/5 ?  ?PELVIC MMT: ?  ?MMT   ?12/13/2021  ?Vaginal 2/5; 4s holds; 4 reps  ?Internal Anal Sphincter    ?External Anal Sphincter    ?Puborectalis    ?Diastasis Recti    ?(Blank rows = not tested) ?  ?      PALPATION: ?  General  tightness felt at bil upper abdominal quadrants and at diaphragm  ?  ?              External Perineal Exam WFL ?              ?              Internal Pelvic Floor no TTP ?  ?TONE: ?Mildly increased ?  ?PROLAPSE: ?None in hooklying ?  ?TODAY'S TREATMENT  ? ?12/28/2021: ? Manual work at abdomen with soft tissue and fascial release completed. Initially soft gentle massage to decrease tension throughout abdomen, progressed to indirect fascial release, progressed to direct as pt tolerated without increase pain. Pt demonstrated decreased tension and improved tissue mobility and reported less pain. PT also provided x10 rib mobilization with pt's breathing cycles for decreased restrictions. Pt tolerated well.  ?Moist heat at back through treatment with x5 layers of cloth, skin inspected after treatment no redness or irritations noted.  ? ?EVAL 12/13/2021 Examination completed, findings reviewed, pt educated on POC, HEP. Pt motivated to participate in PT and agreeable to attempt recommendations.  ?  ?  ?  ?PATIENT EDUCATION:  ?Education details: [id number] ?Person educated:  Patient ?Education method: Explanation, Demonstration, Tactile cues, Verbal cues, and Handouts ?Education comprehension: verbalized understanding and returned demonstration ?  ?  ?HOME EXERCISE PROGRAM: ?[id number] ?  ?ASSESSMENT: ?  ?CLINICAL IMPRESSION: ?Patient presents to clinic with high pain levels reporting she thinks she is in a fibro flare. Pt session focused on relaxation breathing and manual work at abdomen with moist heat at back for improved tissue relaxation and decrease tension at all quadrants of abdomen. Pt reports she felt much better at end of session and pain lower than at start of session. Pt would benefit from additional  PT to further address deficits.   ?  ?  ?OBJECTIVE IMPAIRMENTS decreased coordination, decreased endurance, decreased strength, increased fascial restrictions, impaired flexibility, improper body mechanics, and postural dysfunction.  ?  ?ACTIVITY LIMITATIONS community activity.  ?  ?PERSONAL FACTORS 1 comorbidity: vaginal/c-section history  are also affecting patient's functional outcome.  ?  ?  ?REHAB POTENTIAL: Good ?  ?CLINICAL DECISION MAKING: Stable/uncomplicated ?  ?EVALUATION COMPLEXITY: Low ?  ?  ?GOALS: ?Goals reviewed with patient? Yes ?  ?SHORT TERM GOALS: Target date: 01/10/2022 ?  ?Pt to be I with HEP. ?Baseline: ?Goal status: INITIAL ?  ?2.  Pt to report no more than 3 pads used per day to demonstrate decreased leakage amounts.  ?Baseline:  ?Goal status: INITIAL ?  ?3.  Pt to report good voiding mechanics without cues I to improve ability to fully empty and decrease leakage.  ?Baseline:  ?Goal status: INITIAL ?  ?4.  Pt to demonstrate 4/5 bil hip strength for improved pelvic stability ?Baseline:  ?Goal status: INITIAL ?  ?5.  Pt to demonstrate 3/5 pelvic floor strength for decreased leakage.  ?Baseline:  ?Goal status: INITIAL ?  ?  ?  ?LONG TERM GOALS: Target date: 03/14/2022 ?  ?Pt to be I with advanced HEP. ?Baseline:  ?Goal status: INITIAL ?  ?2.   Pt to report no  more than 1 pad used per day to demonstrate decreased leakage amounts.  ?Baseline:  ?Goal status: INITIAL ?  ?3.  Pt to demonstrate 4/5 pelvic floor strength and ability to fully relax after strengthening for

## 2021-12-28 NOTE — Telephone Encounter (Signed)
Pt would like an appt with Dr Ernestina Patches ?

## 2022-01-04 ENCOUNTER — Ambulatory Visit: Payer: 59 | Attending: Obstetrics & Gynecology | Admitting: Physical Therapy

## 2022-01-04 DIAGNOSIS — R279 Unspecified lack of coordination: Secondary | ICD-10-CM | POA: Diagnosis present

## 2022-01-04 DIAGNOSIS — M62838 Other muscle spasm: Secondary | ICD-10-CM | POA: Insufficient documentation

## 2022-01-04 DIAGNOSIS — N393 Stress incontinence (female) (male): Secondary | ICD-10-CM | POA: Insufficient documentation

## 2022-01-04 DIAGNOSIS — R293 Abnormal posture: Secondary | ICD-10-CM | POA: Diagnosis present

## 2022-01-04 DIAGNOSIS — M6281 Muscle weakness (generalized): Secondary | ICD-10-CM | POA: Insufficient documentation

## 2022-01-04 NOTE — Therapy (Signed)
?OUTPATIENT PHYSICAL THERAPY TREATMENT NOTE ? ? ?Patient Name: Emily Ward ?MRN: [id number] ?DOB:[birth year], 66 y.o., female ?Today's Date: 01/04/2022 ? ?PCP: Laurey Morale, MD ?REFERRING PROVIDER: Megan Salon, MD ? ?END OF SESSION:  ? PT End of Session - 01/04/22 1359   ? ? Visit Number 3   ? Date for PT Re-Evaluation 03/14/22   ? Authorization Type UHC   ? PT Start Time 1400   ? PT Stop Time 1440   ? PT Time Calculation (min) 40 min   ? Activity Tolerance Patient tolerated treatment well   ? Behavior During Therapy [facility] for tasks assessed/performed   ? ?  ?  ? ?  ? ? ?Past Medical History:  ?Diagnosis Date  ? Cervical disc disease 2006  ? COPD (chronic obstructive pulmonary disease) ([place])   ? Emphysema lung ([place])   ? 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 with second pregnancy.  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    ? Fibromyalgia   ? sees Dr. Estanislado Pandy   ? Heart murmur   ? 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 Hyperlipidemia   ? Migraines   ? MVP (mitral valve prolapse)   ? no daily medication  ? Peptic ulcer   ? no problems  ? ?Past Surgical History:  ?Procedure Laterality Date  ? CERVICAL LAMINECTOMY  2006  ? [place]  ? CHOLECYSTECTOMY  06/2002  ? CYSTOSCOPY N/A 09/01/2018  ? Procedure: CYSTOSCOPY;  Surgeon: Megan Salon, MD;  Location: [facility];  Service: Gynecology;  Laterality: N/A;  ? DILATATION & CURETTAGE/HYSTEROSCOPY WITH MYOSURE N/A 04/12/2017  ? Procedure: DILATATION & CURETTAGE/HYSTEROSCOPY;  Surgeon: Megan Salon, MD;  Location: [facility];  Service: Gynecology;  Laterality: N/A;  ? facet joint block  03/30/2021  ? per patient, performed by Dr. Ernestina Patches  ? HYSTEROSCOPY    ? years ago with Dr Radene Knee-? 2000  ? TOTAL LAPAROSCOPIC HYSTERECTOMY WITH SALPINGECTOMY Bilateral 09/01/2018  ? Procedure: TOTAL LAPAROSCOPIC HYSTERECTOMY WITH SALPINGECTOMY  BSO, REPAIR OF PERINEAL LACERATION;  Surgeon: Megan Salon, MD;  Location: [place];   Service: Gynecology;  Laterality: Bilateral;  possible BSO  ? URETHRAL DILATION    ? ?Patient Active Problem List  ? Diagnosis Date Noted  ? Hyperparathyroidism ([place]) 10/18/2020  ? Osteoporosis 10/18/2020  ? Concussion 10/27/2018  ? Postmenopausal bleeding 09/01/2018  ? MGUS (monoclonal gammopathy of unknown significance) 02/13/2018  ? COPD with emphysema ([place]) 10/04/2017  ? Palpitations 07/30/2017  ? Shortness of breath 07/30/2017  ? Hormone replacement therapy (HRT) 03/30/2017  ? Former smoker 03/30/2017  ? Other fatigue 06/30/2016  ? Insomnia 06/30/2016  ? DJD (degenerative joint disease), cervical 06/30/2016  ? 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/2016  ? IBS (irritable bowel syndrome) 06/30/2016  ? Interstitial cystitis 06/30/2016  ? SHOULDER PAIN, RIGHT 09/05/2010  ? GERD 03/24/2009  ? SACROILIAC STRAIN 03/24/2009  ? Atypical chest pain 01/20/2008  ? HEMATURIA UNSPECIFIED 10/14/2007  ? FIBROMYALGIA 07/03/2007  ? HYPERLIPIDEMIA NEC/NOS 05/16/2007  ? HEADACHE 05/09/2007  ? ? ?REFERRING DIAG: N39.3 (ICD-10-CM) - Stress incontinence of urine ? ?THERAPY DIAG:  ?Muscle weakness (generalized) ? ?Unspecified lack of coordination ? ?Stress incontinence (female) (female) ? ?PERTINENT HISTORY: COPD, cervical disc disease, emphysema, fibromyalgia, heart murmur, hepatitis, HLD, MVP, migraines, CERVICAL LAMINECTOMY, CESAREAN SECTION, CHOLECYSTECTOMY, HYSTEROSCOPY, URETHRAL DILATION ?Sexual abuse: No ? ?PRECAUTIONS: osteoporosis, hepatitis  ? ?SUBJECTIVE: Pt reports she is better with pain  compared to last session, but reports she "feels wet" all day yesterday. Would sit to make sure she done urinating then would walk a bit and have a leakage.  ?PAIN:  ?Are you having pain? Yes: NPRS scale: 8/10 ?Pain location: globally but worse at Lt side ?Pain description: constant dull ache  ?Aggravating factors: mobility ?Relieving factors: heat, rubbing it helps a little bit ? ? ?OBJECTIVE: (objective  measures completed at initial evaluation unless otherwise dated) ? ? ?OBJECTIVE:  ?  ?DIAGNOSTIC FINDINGS:  ?  ?  ?COGNITION: ?           Overall cognitive status: Within functional limits for tasks assessed              ?            ?SENSATION: ?           Light touch: Appears intact ?           Proprioception: Appears intact ?  ?MUSCLE LENGTH: ?Bil hamstrings and adductors limited by 25% ?  ?  ?POSTURE:  ?Rounded shoulders, posterior pelvic tilt ?  ?LUMBARAROM/PROM ?  ?Bil side bending and rotation limited by 25% ?  ?LE ROM: ?Bil LE WFL ?  ?LE MMT: ?  ?Bil hip abduction 3/5; all other hips 3+/5; knees 4/5, ankles 5/5 ?  ?PELVIC MMT: ?  ?MMT   ?12/13/2021 01/04/2022   ?Vaginal 2/5; 4s holds; 4 reps 3/5 (with NMRE quick release and cues); 10s holds; 3 reps  ?Internal Anal Sphincter     ?External Anal Sphincter     ?Puborectalis     ?Diastasis Recti     ?(Blank rows = not tested) ?  ?      PALPATION: ?  General  tightness felt at bil upper abdominal quadrants and at diaphragm  ?  ?              External Perineal Exam WFL ?              ?              Internal Pelvic Floor no TTP ?  ?TONE: ?Mildly increased ?  ?PROLAPSE: ?None in hooklying ?  ?TODAY'S TREATMENT  ? ?01/04/2022: ?Manual work - internal vaginal treatment with pt consent ?3x10 pelvic floor contraction, quick flicks, and L38 isometric holds for 10s.   Pt benefited from VC and tactile cues for technique, movement pattern, activation, and relaxation for contractions and NMRE used for this coordination training and quick release techniques.  ? ? ?12/28/2021: ? Manual work at abdomen with soft tissue and fascial release completed. Initially soft gentle massage to decrease tension throughout abdomen, progressed to indirect fascial release, progressed to direct as pt tolerated without increase pain. Pt demonstrated decreased tension and improved tissue mobility and reported less pain. PT also provided x10 rib mobilization with pt's breathing cycles for decreased  restrictions. Pt tolerated well.  ?Moist heat at back through treatment with x5 layers of cloth, skin inspected after treatment no redness or irritations noted.  ? ?EVAL 12/13/2021 Examination completed, findings reviewed, pt educated on POC, HEP. Pt motivated to participate in PT and agreeable to attempt recommendations.  ?  ?  ?  ?PATIENT EDUCATION:  ?Education details: [id number] ?Person educated: Patient ?Education method: Explanation, Demonstration, Tactile cues, Verbal cues, and Handouts ?Education comprehension: verbalized understanding and returned demonstration ?  ?  ?HOME EXERCISE PROGRAM: ?[id number] ?  ?ASSESSMENT: ?  ?CLINICAL IMPRESSION: ?Patient presents to clinic reporting  she has still been having leakage and doesn't feel like she can gain a contraction well or understand when she is consistently. Pt session focused on internal vaginal treatment with pt consent, details above. Pt demonstrated improved strength than at eval with moderate-max VC. Pt would benefit from additional PT to further address deficits.   ?  ?  ?OBJECTIVE IMPAIRMENTS decreased coordination, decreased endurance, decreased strength, increased fascial restrictions, impaired flexibility, improper body mechanics, and postural dysfunction.  ?  ?ACTIVITY LIMITATIONS community activity.  ?  ?PERSONAL FACTORS 1 comorbidity: vaginal/c-section history  are also affecting patient's functional outcome.  ?  ?  ?REHAB POTENTIAL: Good ?  ?CLINICAL DECISION MAKING: Stable/uncomplicated ?  ?EVALUATION COMPLEXITY: Low ?  ?  ?GOALS: ?Goals reviewed with patient? Yes ?  ?SHORT TERM GOALS: Target date: 01/10/2022 ?  ?Pt to be I with HEP. ?Baseline: ?Goal status: INITIAL ?  ?2.  Pt to report no more than 3 pads used per day to demonstrate decreased leakage amounts.  ?Baseline:  ?Goal status: INITIAL ?  ?3.  Pt to report good voiding mechanics without cues I to improve ability to fully empty and decrease leakage.  ?Baseline:  ?Goal status: INITIAL ?  ?4.   Pt to demonstrate 4/5 bil hip strength for improved pelvic stability ?Baseline:  ?Goal status: INITIAL ?  ?5.  Pt to demonstrate 3/5 pelvic floor strength for decreased leakage.  ?Baseline:  ?Goal status: IN

## 2022-01-08 ENCOUNTER — Telehealth: Payer: Self-pay | Admitting: Rheumatology

## 2022-01-08 NOTE — Telephone Encounter (Signed)
Patient states she noticed that the monoclonal protein showed up on her SPEP. Patient advised we are awaiting the IFE interpretation prior to calling with those results. Advised patient we would reach out once we receive them. Patient expressed understanding.  ?

## 2022-01-08 NOTE — Telephone Encounter (Signed)
Patient called the office stating she she had labs done on 01/01/22. Patient states one came back after the rest and seemed abnormal. Patient states she saw it on MyChart and wants to know if its anything for her to worry about. Patient states she would like to know if further testing is necessary. Patient requests a return call. ?

## 2022-01-09 ENCOUNTER — Ambulatory Visit: Payer: 59 | Admitting: Physical Therapy

## 2022-01-09 DIAGNOSIS — R279 Unspecified lack of coordination: Secondary | ICD-10-CM

## 2022-01-09 DIAGNOSIS — M6281 Muscle weakness (generalized): Secondary | ICD-10-CM | POA: Diagnosis not present

## 2022-01-09 DIAGNOSIS — R293 Abnormal posture: Secondary | ICD-10-CM

## 2022-01-09 NOTE — Therapy (Signed)
?OUTPATIENT PHYSICAL THERAPY TREATMENT NOTE ? ? ?Patient Name: Emily Ward ?MRN: [id number] ?DOB:02-23-1956, 66 y.o., female ?Today's Date: 01/09/2022 ? ?PCP: Laurey Morale, MD ?REFERRING PROVIDER: Megan Salon, MD ? ?END OF SESSION:  ? PT End of Session - 01/09/22 1605   ? ? Visit Number 4   ? Date for PT Re-Evaluation 03/14/22   ? Authorization Type UHC   ? PT Start Time 4034   ? PT Stop Time 7425   ? PT Time Calculation (min) 40 min   ? Activity Tolerance Patient tolerated treatment well   ? Behavior During Therapy [facility] for tasks assessed/performed   ? ?  ?  ? ?  ? ? ? ?Past Medical History:  ?Diagnosis Date  ? Cervical disc disease 2006  ? COPD (chronic obstructive pulmonary disease) ([place])   ? Emphysema lung ([place])   ? 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 with second pregnancy.  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    ? Fibromyalgia   ? sees Dr. Estanislado Pandy   ? Heart murmur   ? 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 Hyperlipidemia   ? Migraines   ? MVP (mitral valve prolapse)   ? no daily medication  ? Peptic ulcer   ? no problems  ? ?Past Surgical History:  ?Procedure Laterality Date  ? CERVICAL LAMINECTOMY  2006  ? [place]  ? CHOLECYSTECTOMY  06/2002  ? CYSTOSCOPY N/A 09/01/2018  ? Procedure: CYSTOSCOPY;  Surgeon: Megan Salon, MD;  Location: [facility];  Service: Gynecology;  Laterality: N/A;  ? DILATATION & CURETTAGE/HYSTEROSCOPY WITH MYOSURE N/A 04/12/2017  ? Procedure: DILATATION & CURETTAGE/HYSTEROSCOPY;  Surgeon: Megan Salon, MD;  Location: [facility];  Service: Gynecology;  Laterality: N/A;  ? facet joint block  03/30/2021  ? per patient, performed by Dr. Ernestina Patches  ? HYSTEROSCOPY    ? years ago with Dr Radene Knee-? 2000  ? TOTAL LAPAROSCOPIC HYSTERECTOMY WITH SALPINGECTOMY Bilateral 09/01/2018  ? Procedure: TOTAL LAPAROSCOPIC HYSTERECTOMY WITH SALPINGECTOMY  BSO, REPAIR OF PERINEAL LACERATION;  Surgeon: Megan Salon, MD;  Location: [place];  Service: Gynecology;  Laterality: Bilateral;  possible BSO  ? URETHRAL DILATION    ? ?Patient Active Problem List  ? Diagnosis Date Noted  ? Hyperparathyroidism ([place]) 10/18/2020  ? Osteoporosis 10/18/2020  ? Concussion 10/27/2018  ? Postmenopausal bleeding 09/01/2018  ? MGUS (monoclonal gammopathy of unknown significance) 02/13/2018  ? COPD with emphysema ([place]) 10/04/2017  ? Palpitations 07/30/2017  ? Shortness of breath 07/30/2017  ? Hormone replacement therapy (HRT) 03/30/2017  ? Former smoker 03/30/2017  ? Other fatigue 06/30/2016  ? Insomnia 06/30/2016  ? DJD (degenerative joint disease), cervical 06/30/2016  ? 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/2016  ? IBS (irritable bowel syndrome) 06/30/2016  ? Interstitial cystitis 06/30/2016  ? SHOULDER PAIN, RIGHT 09/05/2010  ? GERD 03/24/2009  ? SACROILIAC STRAIN 03/24/2009  ? Atypical chest pain 01/20/2008  ? HEMATURIA UNSPECIFIED 10/14/2007  ? FIBROMYALGIA 07/03/2007  ? HYPERLIPIDEMIA NEC/NOS 05/16/2007  ? HEADACHE 05/09/2007  ? ? ?REFERRING DIAG: N39.3 (ICD-10-CM) - Stress incontinence of urine ? ?THERAPY DIAG:  ?Muscle weakness (generalized) ? ?Abnormal posture ? ?Unspecified lack of coordination ? ?PERTINENT HISTORY: COPD, cervical disc disease, emphysema, fibromyalgia, heart murmur, hepatitis, HLD, MVP, migraines, CERVICAL LAMINECTOMY, CESAREAN SECTION, CHOLECYSTECTOMY, HYSTEROSCOPY, URETHRAL DILATION ?Sexual abuse: No ? ?PRECAUTIONS: osteoporosis, hepatitis  ? ?SUBJECTIVE: Pt reports she has been doing HEP and  quad thoracic rotation exercise caused some pain in neck so pt stopped doing this one. Pt reports leakage is a little better, does still happen but less often. Pt unsure what causes leakage or when she does have leakage. Pt does report she feels limited with fatigue globally.  ?PAIN:  ?Are you having pain? Yes: NPRS scale: 4/10 but reports this is her baseline with best pain levels ?Pain location: globally but  worse at Lt side ?Pain description: constant dull ache  ?Aggravating factors: mobility ?Relieving factors: heat, rubbing it helps a little bit ? ? ?OBJECTIVE: (objective measures completed at initial evaluation unless otherwise dated) ? ? ?OBJECTIVE:  ?  ?DIAGNOSTIC FINDINGS:  ?  ?  ?COGNITION: ?           Overall cognitive status: Within functional limits for tasks assessed              ?            ?SENSATION: ?           Light touch: Appears intact ?           Proprioception: Appears intact ?  ?MUSCLE LENGTH: ?Bil hamstrings and adductors limited by 25% ?  ?  ?POSTURE:  ?Rounded shoulders, posterior pelvic tilt ?  ?LUMBARAROM/PROM ?  ?Bil side bending and rotation limited by 25% ?  ?LE ROM: ?Bil LE WFL ?  ?LE MMT: ?  ?Bil hip abduction 3/5; all other hips 3+/5; knees 4/5, ankles 5/5 ?  ?PELVIC MMT: ?  ?MMT   ?12/13/2021 01/04/2022   ?Vaginal 2/5; 4s holds; 4 reps 3/5 (with NMRE quick release and cues); 10s holds; 3 reps  ?Internal Anal Sphincter     ?External Anal Sphincter     ?Puborectalis     ?Diastasis Recti     ?(Blank rows = not tested) ?  ?      PALPATION: ?  General  tightness felt at bil upper abdominal quadrants and at diaphragm  ?  ?              External Perineal Exam WFL ?              ?              Internal Pelvic Floor no TTP ?  ?TONE: ?Mildly increased ?  ?PROLAPSE: ?None in hooklying ?  ?TODAY'S TREATMENT  ? ?01/09/2022: ?2x10 bridges with ball squeeze ?2x10 sidelying ball press and hip abduction ?2x10 Hooklying opp hand/knee press ?All exercises cued for breathing and pelvic floor coordination for improved pelvic floor strengthening.  ? ?01/04/2022: ?Manual work - internal vaginal treatment with pt consent ?3x10 pelvic floor contraction, quick flicks, and Y70 isometric holds for 10s.   Pt benefited from VC and tactile cues for technique, movement pattern, activation, and relaxation for contractions and NMRE used for this coordination training and quick release techniques.  ? ? ?12/28/2021: ? Manual  work at abdomen with soft tissue and fascial release completed. Initially soft gentle massage to decrease tension throughout abdomen, progressed to indirect fascial release, progressed to direct as pt tolerated without increase pain. Pt demonstrated decreased tension and improved tissue mobility and reported less pain. PT also provided x10 rib mobilization with pt's breathing cycles for decreased restrictions. Pt tolerated well.  ?Moist heat at back through treatment with x5 layers of cloth, skin inspected after treatment no redness or irritations noted.  ? ?EVAL 12/13/2021 Examination completed, findings reviewed, pt educated on POC, HEP. Pt  motivated to participate in PT and agreeable to attempt recommendations.  ?  ?  ?  ?PATIENT EDUCATION:  ?Education details: [id number] ?Person educated: Patient ?Education method: Explanation, Demonstration, Tactile cues, Verbal cues, and Handouts ?Education comprehension: verbalized understanding and returned demonstration ?  ?  ?HOME EXERCISE PROGRAM: ?[id number] ?  ?ASSESSMENT: ?  ?CLINICAL IMPRESSION: ?Patient presents to clinic reporting she has noticed some changes in leakage frequency but still unsure when she has leakage just noticed less, and pt reports she is now urinating every 2 hours instead of every one now. Pt session focused on hip and core strengthening with pelvic floor coordination and breathing. And HEP updated to decrease strain/pain with pt's neck with quad thoracic exercise. Pt tolerated well but benefited from rest breaks and cues for technique. Pt would benefit from additional PT to further address deficits.   ?  ?  ?OBJECTIVE IMPAIRMENTS decreased coordination, decreased endurance, decreased strength, increased fascial restrictions, impaired flexibility, improper body mechanics, and postural dysfunction.  ?  ?ACTIVITY LIMITATIONS community activity.  ?  ?PERSONAL FACTORS 1 comorbidity: vaginal/c-section history  are also affecting patient's functional  outcome.  ?  ?  ?REHAB POTENTIAL: Good ?  ?CLINICAL DECISION MAKING: Stable/uncomplicated ?  ?EVALUATION COMPLEXITY: Low ?  ?  ?GOALS: ?Goals reviewed with patient? Yes ?  ?SHORT TERM GOALS: Target date: 01/10/2022 ?  ?

## 2022-01-11 LAB — CBC WITH DIFFERENTIAL/PLATELET
Absolute Monocytes: 365 cells/uL (ref 200–950)
Basophils Absolute: 50 cells/uL (ref 0–200)
Basophils Relative: 1 %
Eosinophils Absolute: 250 cells/uL (ref 15–500)
Eosinophils Relative: 5 %
HCT: 41.3 % (ref 35.0–45.0)
Hemoglobin: 14 g/dL (ref 11.7–15.5)
Lymphs Abs: 1430 cells/uL (ref 850–3900)
MCH: 32.7 pg (ref 27.0–33.0)
MCHC: 33.9 g/dL (ref 32.0–36.0)
MCV: 96.5 fL (ref 80.0–100.0)
MPV: 11.5 fL (ref 7.5–12.5)
Monocytes Relative: 7.3 %
Neutro Abs: 2905 cells/uL (ref 1500–7800)
Neutrophils Relative %: 58.1 %
Platelets: 204 10*3/uL (ref 140–400)
RBC: 4.28 10*6/uL (ref 3.80–5.10)
RDW: 11.8 % (ref 11.0–15.0)
Total Lymphocyte: 28.6 %
WBC: 5 10*3/uL (ref 3.8–10.8)

## 2022-01-11 LAB — COMPLETE METABOLIC PANEL WITH GFR
AG Ratio: 1.6 (calc) (ref 1.0–2.5)
ALT: 9 U/L (ref 6–29)
AST: 21 U/L (ref 10–35)
Albumin: 4.3 g/dL (ref 3.6–5.1)
Alkaline phosphatase (APISO): 64 U/L (ref 37–153)
BUN: 17 mg/dL (ref 7–25)
CO2: 27 mmol/L (ref 20–32)
Calcium: 10 mg/dL (ref 8.6–10.4)
Chloride: 103 mmol/L (ref 98–110)
Creat: 0.65 mg/dL (ref 0.50–1.05)
Globulin: 2.7 g/dL (calc) (ref 1.9–3.7)
Glucose, Bld: 95 mg/dL (ref 65–99)
Potassium: 4.5 mmol/L (ref 3.5–5.3)
Sodium: 138 mmol/L (ref 135–146)
Total Bilirubin: 0.4 mg/dL (ref 0.2–1.2)
Total Protein: 7 g/dL (ref 6.1–8.1)
eGFR: 99 mL/min/{1.73_m2} (ref >=60)

## 2022-01-11 LAB — PROTEIN ELECTROPHORESIS, SERUM, WITH REFLEX
Albumin ELP: 4.3 g/dL (ref 3.8–4.8)
Alpha 1: 0.3 g/dL (ref 0.2–0.3)
Alpha 2: 0.7 g/dL (ref 0.5–0.9)
Beta 2: 0.3 g/dL (ref 0.2–0.5)
Beta Globulin: 0.4 g/dL (ref 0.4–0.6)
Gamma Globulin: 1 g/dL (ref 0.8–1.7)
Total Protein: 6.9 g/dL (ref 6.1–8.1)

## 2022-01-11 LAB — VITAMIN D 25 HYDROXY (VIT D DEFICIENCY, FRACTURES): Vit D, 25-Hydroxy: 59 ng/mL (ref 30–100)

## 2022-01-11 LAB — PARATHYROID HORMONE, INTACT (NO CA): PTH: 45 pg/mL (ref 16–77)

## 2022-01-11 LAB — GLIADIN ANTIBODIES, SERUM
Gliadin IgA: <1 U/mL
Gliadin IgG: <1 U/mL

## 2022-01-11 LAB — IFE INTERPRETATION: Immunofix Electr Int: NOT DETECTED

## 2022-01-11 LAB — TISSUE TRANSGLUTAMINASE, IGA: (tTG) Ab, IgA: <1 U/mL

## 2022-01-11 NOTE — Progress Notes (Signed)
IFE negative.  All the labs were unremarkable.

## 2022-01-16 ENCOUNTER — Ambulatory Visit: Payer: 59 | Admitting: Physical Therapy

## 2022-01-16 DIAGNOSIS — M6281 Muscle weakness (generalized): Secondary | ICD-10-CM

## 2022-01-16 DIAGNOSIS — R279 Unspecified lack of coordination: Secondary | ICD-10-CM

## 2022-01-16 DIAGNOSIS — M62838 Other muscle spasm: Secondary | ICD-10-CM

## 2022-01-16 NOTE — Therapy (Signed)
?OUTPATIENT PHYSICAL THERAPY TREATMENT NOTE ? ? ?Patient Name: Emily Ward ?MRN: [id number] ?DOB:February 10, 1956, 66 y.o., female ?Today's Date: 01/16/2022 ? ?PCP: Laurey Morale, MD ?REFERRING PROVIDER: Megan Salon, MD ? ?END OF SESSION:  ? PT End of Session - 01/16/22 1538   ? ? Visit Number 5   ? Date for PT Re-Evaluation 03/14/22   ? Authorization Type UHC   ? PT Start Time 1532   ? PT Stop Time 1610   ? PT Time Calculation (min) 38 min   ? Activity Tolerance Patient tolerated treatment well   ? Behavior During Therapy [facility] for tasks assessed/performed   ? ?  ?  ? ?  ? ? ? ?Past Medical History:  ?Diagnosis Date  ? Cervical disc disease 2006  ? COPD (chronic obstructive pulmonary disease) ([place])   ? Emphysema lung ([place])   ? 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 with second pregnancy.  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    ? Fibromyalgia   ? sees Dr. Estanislado Pandy   ? Heart murmur   ? 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 Hyperlipidemia   ? Migraines   ? MVP (mitral valve prolapse)   ? no daily medication  ? Peptic ulcer   ? no problems  ? ?Past Surgical History:  ?Procedure Laterality Date  ? CERVICAL LAMINECTOMY  2006  ? [place]  ? CHOLECYSTECTOMY  06/2002  ? CYSTOSCOPY N/A 09/01/2018  ? Procedure: CYSTOSCOPY;  Surgeon: Megan Salon, MD;  Location: [facility];  Service: Gynecology;  Laterality: N/A;  ? DILATATION & CURETTAGE/HYSTEROSCOPY WITH MYOSURE N/A 04/12/2017  ? Procedure: DILATATION & CURETTAGE/HYSTEROSCOPY;  Surgeon: Megan Salon, MD;  Location: [facility];  Service: Gynecology;  Laterality: N/A;  ? facet joint block  03/30/2021  ? per patient, performed by Dr. Ernestina Patches  ? HYSTEROSCOPY    ? years ago with Dr Radene Knee-? 2000  ? TOTAL LAPAROSCOPIC HYSTERECTOMY WITH SALPINGECTOMY Bilateral 09/01/2018  ? Procedure: TOTAL LAPAROSCOPIC HYSTERECTOMY WITH SALPINGECTOMY  BSO, REPAIR OF PERINEAL LACERATION;  Surgeon: Megan Salon, MD;  Location: [place];  Service: Gynecology;  Laterality: Bilateral;  possible BSO  ? URETHRAL DILATION    ? ?Patient Active Problem List  ? Diagnosis Date Noted  ? Hyperparathyroidism ([place]) 10/18/2020  ? Osteoporosis 10/18/2020  ? Concussion 10/27/2018  ? Postmenopausal bleeding 09/01/2018  ? MGUS (monoclonal gammopathy of unknown significance) 02/13/2018  ? COPD with emphysema ([place]) 10/04/2017  ? Palpitations 07/30/2017  ? Shortness of breath 07/30/2017  ? Hormone replacement therapy (HRT) 03/30/2017  ? Former smoker 03/30/2017  ? Other fatigue 06/30/2016  ? Insomnia 06/30/2016  ? DJD (degenerative joint disease), cervical 06/30/2016  ? 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/2016  ? IBS (irritable bowel syndrome) 06/30/2016  ? Interstitial cystitis 06/30/2016  ? SHOULDER PAIN, RIGHT 09/05/2010  ? GERD 03/24/2009  ? SACROILIAC STRAIN 03/24/2009  ? Atypical chest pain 01/20/2008  ? HEMATURIA UNSPECIFIED 10/14/2007  ? FIBROMYALGIA 07/03/2007  ? HYPERLIPIDEMIA NEC/NOS 05/16/2007  ? HEADACHE 05/09/2007  ? ? ?REFERRING DIAG: N39.3 (ICD-10-CM) - Stress incontinence of urine ? ?THERAPY DIAG:  ?Muscle weakness (generalized) ? ?Other muscle spasm ? ?Unspecified lack of coordination ? ?PERTINENT HISTORY: COPD, cervical disc disease, emphysema, fibromyalgia, heart murmur, hepatitis, HLD, MVP, migraines, CERVICAL LAMINECTOMY, CESAREAN SECTION, CHOLECYSTECTOMY, HYSTEROSCOPY, URETHRAL DILATION ?Sexual abuse: No ? ?PRECAUTIONS: osteoporosis, hepatitis  ? ?SUBJECTIVE: Pt reports she has been feeling better,  having less leakage overall, and is able to be more active. Pt does report intermittent pain at Lt sided SIJ point and with this hurting she notices increased leakage.  ?PAIN:  ?Are you having pain? Yes: NPRS scale: 4/10 but reports this is her baseline with best pain levels ?Pain location: globally but worse at Lt side ?Pain description: constant dull ache  ?Aggravating factors: mobility ?Relieving factors:  heat, rubbing it helps a little bit ? ? ?OBJECTIVE: (objective measures completed at initial evaluation unless otherwise dated) ? ? ?OBJECTIVE:  ?  ?DIAGNOSTIC FINDINGS:  ?  ?  ?COGNITION: ?           Overall cognitive status: Within functional limits for tasks assessed              ?            ?SENSATION: ?           Light touch: Appears intact ?           Proprioception: Appears intact ?  ?MUSCLE LENGTH: ?Bil hamstrings and adductors limited by 25% ?  ?  ?POSTURE:  ?Rounded shoulders, posterior pelvic tilt ?  ?LUMBARAROM/PROM ?  ?Bil side bending and rotation limited by 25% ?  ?LE ROM: ?Bil LE WFL ?  ?LE MMT: ?  ?Bil hip abduction 3/5; all other hips 3+/5; knees 4/5, ankles 5/5 ?  ?PELVIC MMT: ?  ?MMT   ?12/13/2021 01/04/2022   ?Vaginal 2/5; 4s holds; 4 reps 3/5 (with NMRE quick release and cues); 10s holds; 3 reps  ?Internal Anal Sphincter     ?External Anal Sphincter     ?Puborectalis     ?Diastasis Recti     ?(Blank rows = not tested) ?  ?      PALPATION: ?  General  tightness felt at bil upper abdominal quadrants and at diaphragm  ?  ?              External Perineal Exam WFL ?              ?              Internal Pelvic Floor no TTP ?  ?TONE: ?Mildly increased ?  ?PROLAPSE: ?None in hooklying ?  ?TODAY'S TREATMENT  ? ?01/16/2022: ? Nustep 5 mins L3 ?Squats x15 body weight ?Bridge with ball squeeze x20 ?Knee hovers with pall press x8 each ?Trunk sidelying openers on small purple ball 2x30s each side and 1 min in supine for thoracic extension all with 360 breathing ? Rib mobility in supine x10 with diaphragmatic breathing and lower ribs over pressure for exhale with tightness felt at Rt side more so than Lt but improved after this and pt reported she felt much better, "I can breath better".  ? ?01/09/2022: ?2x10 bridges with ball squeeze ?2x10 sidelying ball press and hip abduction ?2x10 Hooklying opp hand/knee press ?All exercises cued for breathing and pelvic floor coordination for improved pelvic floor  strengthening.  ? ?01/04/2022: ?Manual work - internal vaginal treatment with pt consent ?3x10 pelvic floor contraction, quick flicks, and H37 isometric holds for 10s.   Pt benefited from VC and tactile cues for technique, movement pattern, activation, and relaxation for contractions and NMRE used for this coordination training and quick release techniques.  ? ? ?12/28/2021: ? Manual work at abdomen with soft tissue and fascial release completed. Initially soft gentle massage to decrease tension throughout abdomen, progressed to indirect fascial release, progressed to direct as  pt tolerated without increase pain. Pt demonstrated decreased tension and improved tissue mobility and reported less pain. PT also provided x10 rib mobilization with pt's breathing cycles for decreased restrictions. Pt tolerated well.  ?Moist heat at back through treatment with x5 layers of cloth, skin inspected after treatment no redness or irritations noted.  ? ?EVAL 12/13/2021 Examination completed, findings reviewed, pt educated on POC, HEP. Pt motivated to participate in PT and agreeable to attempt recommendations.  ?  ?  ?  ?PATIENT EDUCATION:  ?Education details: [id number] ?Person educated: Patient ?Education method: Explanation, Demonstration, Tactile cues, Verbal cues, and Handouts ?Education comprehension: verbalized understanding and returned demonstration ?  ?  ?HOME EXERCISE PROGRAM: ?[id number] ?  ?ASSESSMENT: ?  ?CLINICAL IMPRESSION: ?Patient presents to clinic reporting she has noticed continued improvement with mobility and less leakage. Pt session focused on hip and core strengthening with coordination of breathing mechanics  for decreased strain at pelvic floor and decreased leakage. Pt would benefit from additional PT to further address deficits.   ?  ?  ?OBJECTIVE IMPAIRMENTS decreased coordination, decreased endurance, decreased strength, increased fascial restrictions, impaired flexibility, improper body mechanics, and  postural dysfunction.  ?  ?ACTIVITY LIMITATIONS community activity.  ?  ?PERSONAL FACTORS 1 comorbidity: vaginal/c-section history  are also affecting patient's functional outcome.  ?  ?  ?REHAB POTENTIAL: Good ?  ?CLINICAL D

## 2022-01-23 ENCOUNTER — Encounter: Payer: Self-pay | Admitting: Rheumatology

## 2022-01-23 ENCOUNTER — Ambulatory Visit: Payer: 59 | Admitting: Physical Therapy

## 2022-01-23 ENCOUNTER — Telehealth: Payer: Self-pay | Admitting: Rheumatology

## 2022-01-23 DIAGNOSIS — R293 Abnormal posture: Secondary | ICD-10-CM

## 2022-01-23 DIAGNOSIS — R279 Unspecified lack of coordination: Secondary | ICD-10-CM

## 2022-01-23 DIAGNOSIS — M6281 Muscle weakness (generalized): Secondary | ICD-10-CM

## 2022-01-23 NOTE — Therapy (Signed)
OUTPATIENT PHYSICAL THERAPY TREATMENT NOTE   Patient Name: Emily Ward MRN: [id number] DOB:December 05, 1955, 66 y.o., female Today's Date: 01/23/2022  PCP: Laurey Morale, MD REFERRING PROVIDER: Megan Salon, MD  END OF SESSION:   PT End of Session - 01/23/22 1401     Visit Number 6    Date for PT Re-Evaluation 03/14/22    Authorization Type [employer]    PT Start Time 1400    PT Stop Time 3009    PT Time Calculation (min) 39 min    Activity Tolerance Patient tolerated treatment well    Behavior During Therapy [facility] for tasks assessed/performed              Past Medical History:  Diagnosis Date   Cervical disc disease 2006   COPD (chronic obstructive pulmonary disease) ([place])    Emphysema lung ([place])    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     Fibromyalgia    sees Dr. 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   Hyperlipidemia    Migraines    MVP (mitral valve prolapse)    no daily medication   Peptic ulcer    no problems   Past Surgical History:  Procedure Laterality Date   CERVICAL LAMINECTOMY  2006   [place]  06/2002   CYSTOSCOPY N/A 09/01/2018   Procedure: CYSTOSCOPY;  Surgeon: Megan Salon, MD;  Location: [facility];  Service: Gynecology;  Laterality: N/A;   DILATATION & CURETTAGE/HYSTEROSCOPY WITH MYOSURE N/A 04/12/2017   Procedure: DILATATION & CURETTAGE/HYSTEROSCOPY;  Surgeon: Megan Salon, MD;  Location: [facility];  Service: Gynecology;  Laterality: N/A;   facet joint block  03/30/2021   per patient, performed by Dr. Ernestina Patches   HYSTEROSCOPY     years ago with Dr Radene Knee-? [place] SALPINGECTOMY Bilateral 09/01/2018   Procedure: TOTAL LAPAROSCOPIC HYSTERECTOMY WITH SALPINGECTOMY  BSO, REPAIR OF PERINEAL LACERATION;  Surgeon: Megan Salon, MD;  Location: [facility];  Service: Gynecology;  Laterality: Bilateral;  possible BSO   URETHRAL DILATION     Patient Active Problem List   Diagnosis Date Noted   Hyperparathyroidism ([place]) 10/18/2020   Osteoporosis 10/18/2020   Concussion 10/27/2018   Postmenopausal bleeding 09/01/2018   MGUS (monoclonal gammopathy of unknown significance) 02/13/2018   COPD with emphysema ([place]) 10/04/2017   Palpitations 07/30/2017   Shortness of breath 07/30/2017   Hormone replacement therapy (HRT) 03/30/2017   Former smoker 03/30/2017   Other fatigue 06/30/2016   Insomnia 06/30/2016   DJD (degenerative joint disease), cervical 06/30/2016   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/2016   IBS (irritable bowel syndrome) 06/30/2016   Interstitial cystitis 06/30/2016   SHOULDER PAIN, RIGHT 09/05/2010   GERD 03/24/2009   SACROILIAC STRAIN 03/24/2009   Atypical chest pain 01/20/2008   HEMATURIA UNSPECIFIED 10/14/2007   FIBROMYALGIA 07/03/2007   HYPERLIPIDEMIA NEC/NOS 05/16/2007   HEADACHE 05/09/2007    REFERRING DIAG: N39.3 (ICD-10-CM) - Stress incontinence of urine  THERAPY DIAG:  Muscle weakness (generalized)  Abnormal posture  Unspecified lack of coordination  PERTINENT HISTORY: COPD, cervical disc disease, emphysema, fibromyalgia, heart murmur, hepatitis, HLD, MVP, migraines, CERVICAL LAMINECTOMY, CESAREAN SECTION, CHOLECYSTECTOMY, HYSTEROSCOPY, URETHRAL DILATION Sexual abuse: No  PRECAUTIONS: osteoporosis, hepatitis   SUBJECTIVE: Pt reports she started PT for her back/neck  and was a little sore after this but other than has been good. Pt reports she does change her pads 4x per day but some of these changes were due to vaginal discharge but overall doing much better. Leakage is less frequent in general but pt reports she will have leakage without urge at all. Urinary frequency ~2-2.5 hours now.  PAIN:  Are you having pain? NO   OBJECTIVE: (objective measures completed at  initial evaluation unless otherwise dated)   OBJECTIVE:    DIAGNOSTIC FINDINGS:      COGNITION:            Overall cognitive status: Within functional limits for tasks assessed                          SENSATION:            Light touch: Appears intact            Proprioception: Appears intact   MUSCLE LENGTH: Bil hamstrings and adductors limited by 25%     POSTURE:  Rounded shoulders, posterior pelvic tilt   LUMBARAROM/PROM   Bil side bending and rotation limited by 25%   LE ROM: Bil LE WFL   LE MMT:   Bil hip abduction 3/5; all other hips 3+/5; knees 4/5, ankles 5/5   PELVIC MMT:   MMT   12/13/2021 01/04/2022   Vaginal 2/5; 4s holds; 4 reps 3/5 (with NMRE quick release and cues); 10s holds; 3 reps  Internal Anal Sphincter     External Anal Sphincter     Puborectalis     Diastasis Recti     (Blank rows = not tested)         PALPATION:   General  tightness felt at bil upper abdominal quadrants and at diaphragm                  External Perineal Exam [facility]                             Internal Pelvic Floor no TTP   TONE: Mildly increased   PROLAPSE: None in hooklying   TODAY'S TREATMENT   01/23/2022: Nutstep 5 mins L4 Bridge with ball squeeze x20 Hooklying opp hand/knee ball press x20 Thoracic opener for extension and 360 breathing 1 min 2x10 sidelying ball press and hip abduction Squats 2x15 body weight Hip machine: abduction, adduction 40# x10 each Cat/cow x10  01/16/2022: Nustep 5 mins L3 Squats x15 body weight Bridge with ball squeeze x20 Knee hovers with ball press x8 each Trunk sidelying openers on small purple ball 2x30s each side and 1 min in supine for thoracic extension all with 360 breathing Rib mobility in supine x10 with diaphragmatic breathing and lower ribs over pressure for exhale with tightness felt at Rt side more so than Lt but improved after this and pt reported she felt much better, "I can breathe better".   01/09/2022: 2x10  bridges with ball squeeze 2x10 sidelying ball press and hip abduction 2x10 Hooklying opp hand/knee press All exercises cued for breathing and pelvic floor coordination for improved pelvic floor strengthening.   01/04/2022: Manual work - internal vaginal treatment with pt consent 3x10 pelvic floor contraction, quick flicks, and E33 isometric holds for 10s. Pt benefited from VC and tactile cues for technique, movement pattern, activation, and relaxation for contractions and NMRE used for this coordination training and quick release techniques.  12/28/2021:  Manual work at abdomen with soft tissue and fascial release completed. Initially soft gentle massage to decrease tension throughout abdomen, progressed to indirect fascial release, progressed to direct as pt tolerated without increase pain. Pt demonstrated decreased tension and improved tissue mobility and reported less pain. PT also provided x10 rib mobilization with pt's breathing cycles for decreased restrictions. Pt tolerated well.  Moist heat at back through treatment with x5 layers of cloth, skin inspected after treatment no redness or irritations noted.   EVAL 12/13/2021 Examination completed, findings reviewed, pt educated on POC, HEP. Pt motivated to participate in PT and agreeable to attempt recommendations.        PATIENT EDUCATION:  Education details: [id number] Person educated: Patient Education method: Consulting civil engineer, Demonstration, Corporate treasurer cues, Verbal cues, and Handouts Education comprehension: verbalized understanding and returned demonstration     HOME EXERCISE PROGRAM: [id number]   ASSESSMENT:   CLINICAL IMPRESSION: Patient presents to clinic reporting she has noticed continued improvement with mobility and less leakage and less pad changes due to leakage throughout the day. Pt session focused on hip and core strengthening with coordination of breathing mechanics  for decreased strain at pelvic floor and decreased leakage.  Session progressed with more standing exercises and pt tolerated well. Pt would benefit from additional PT to further address deficits.       OBJECTIVE IMPAIRMENTS decreased coordination, decreased endurance, decreased strength, increased fascial restrictions, impaired flexibility, improper body mechanics, and postural dysfunction.    ACTIVITY LIMITATIONS community activity.    PERSONAL FACTORS 1 comorbidity: vaginal/c-section history  are also affecting patient's functional outcome.      REHAB POTENTIAL: Good   CLINICAL DECISION MAKING: Stable/uncomplicated   EVALUATION COMPLEXITY: Low     GOALS: Goals reviewed with patient? Yes   SHORT TERM GOALS: Target date: 01/10/2022   Pt to be I with HEP. Baseline: Goal status: MET   2.  Pt to report no more than 3 pads used per day to demonstrate decreased leakage amounts.  Baseline:  Goal status: ON GOING   3.  Pt to report good voiding mechanics without cues I to improve ability to fully empty and decrease leakage.  Baseline:  Goal status: MET   4.  Pt to demonstrate 4/5 bil hip strength for improved pelvic stability Baseline:  Goal status: MET   5.  Pt to demonstrate 3/5 pelvic floor strength for decreased leakage.  Baseline:  Goal status: MET       LONG TERM GOALS: Target date: 03/14/2022   Pt to be I with advanced HEP. Baseline:  Goal status: ON GOING   2.   Pt to report no more than 1 pad used per day to demonstrate decreased leakage amounts.  Baseline:  Goal status: ON GOING   3.  Pt to demonstrate 4/5 pelvic floor strength and ability to fully relax after strengthening for decreased leakage.  Baseline:  Goal status: ON GOING   4.  Pt to report ability to hold urine 3 hours between voids without leakage for improved QOL.  Baseline:  Goal status: ON GOING       PLAN: PT FREQUENCY: 1x/week   PT DURATION:  10 sessions   PLANNED INTERVENTIONS: Therapeutic exercises, Therapeutic activity, Neuromuscular  re-education, Patient/Family education, Joint mobilization, Spinal mobilization, Cryotherapy, Moist heat, Manual lymph drainage, scar mobilization, Taping, Biofeedback, and Manual therapy   PLAN FOR NEXT SESSION: strengthening with coordinating pelvic floor    Stacy Gardner, PT, DPT 05/23/232:42 PM

## 2022-01-23 NOTE — Telephone Encounter (Signed)
Patient called the office stating she needs to know when her Prolia is scheduled. Patient states she had labs a few weeks ago and is unsure when she is getting Prolia.

## 2022-01-23 NOTE — Telephone Encounter (Signed)
Prolia order placed for [facility] off of [address].  Next dose not yet scheduled for Prolia SQ. She is due on or after 01/28/22 Diagnosis: age-related osteoporosis  Dose: '60mg'$  SQ every 6 months  Last Clinic Visit: 12/26/21 Next Clinic Visit: 06/28/22  Last dose: 07/31/21  Labs: CMP wnl on 01/01/22. Vitamin D wnl on 01/01/22  Orders placed for Prolia SQ x 1 dose. No premeds required.  Will follow-up to ensured scheduled and completed   Knox Saliva, PharmD, MPH, BCPS, CPP Clinical Pharmacist (Rheumatology and Pulmonology)

## 2022-01-23 NOTE — Telephone Encounter (Signed)
Returned call to patient. Documented in Prolia encounter.

## 2022-01-24 ENCOUNTER — Telehealth: Payer: Self-pay | Admitting: Pharmacy Technician

## 2022-01-24 NOTE — Telephone Encounter (Signed)
Patient can receive Prolia on or after 01/28/22  Knox Saliva, PharmD, MPH, BCPS, CPP Clinical Pharmacist (Rheumatology and Pulmonology)

## 2022-01-24 NOTE — Telephone Encounter (Addendum)
Dr. Estanislado Pandy, Juluis Rainier note:  Auth Submission: approved Payer: [employer] Medication & CPT/J Code(s) submitted: Prolia (Denosumab) [phone] Route of submission (phone, fax, portal): PORTAL Auth type: Buy/Bill Units/visits requested: X1 DOSE Reference number: [id number] Approval from: 01/24/22 to 09/02/22   Patient will be scheduled as soon as possible.  Patient currently has an approved PA on file. No PA needed for change in site. Ref# [id number] 01/24/22 7:41a

## 2022-01-24 NOTE — Telephone Encounter (Signed)
Please see the note above.

## 2022-01-26 NOTE — Telephone Encounter (Signed)
Patient scheduled Prolia for 02/02/22. Will f/u to ensure completed  Knox Saliva, PharmD, MPH, BCPS, CPP Clinical Pharmacist (Rheumatology and Pulmonology)

## 2022-01-30 ENCOUNTER — Encounter: Payer: 59 | Admitting: Physical Therapy

## 2022-01-31 ENCOUNTER — Ambulatory Visit: Payer: 59 | Admitting: Family Medicine

## 2022-01-31 ENCOUNTER — Encounter: Payer: Self-pay | Admitting: Family Medicine

## 2022-01-31 ENCOUNTER — Encounter: Payer: Self-pay | Admitting: Physical Medicine and Rehabilitation

## 2022-01-31 ENCOUNTER — Ambulatory Visit: Payer: 59 | Admitting: Physical Medicine and Rehabilitation

## 2022-01-31 ENCOUNTER — Ambulatory Visit: Payer: Self-pay

## 2022-01-31 VITALS — BP 102/69 | HR 80

## 2022-01-31 VITALS — BP 110/70 | HR 82 | Temp 98.9°F | Wt 112.0 lb

## 2022-01-31 DIAGNOSIS — J029 Acute pharyngitis, unspecified: Secondary | ICD-10-CM | POA: Diagnosis not present

## 2022-01-31 DIAGNOSIS — R059 Cough, unspecified: Secondary | ICD-10-CM | POA: Diagnosis not present

## 2022-01-31 DIAGNOSIS — M47812 Spondylosis without myelopathy or radiculopathy, cervical region: Secondary | ICD-10-CM | POA: Diagnosis not present

## 2022-01-31 DIAGNOSIS — J019 Acute sinusitis, unspecified: Secondary | ICD-10-CM

## 2022-01-31 HISTORY — PX: OTHER SURGICAL HISTORY: SHX169

## 2022-01-31 LAB — POC COVID19 BINAXNOW: SARS Coronavirus 2 Ag: NEGATIVE

## 2022-01-31 LAB — POCT RAPID STREP A (OFFICE): Rapid Strep A Screen: NEGATIVE

## 2022-01-31 MED ORDER — METHYLPREDNISOLONE ACETATE 80 MG/ML IJ SUSP
80.0000 mg | Freq: Once | INTRAMUSCULAR | Status: AC
  Administered 2022-01-31: 80 mg

## 2022-01-31 MED ORDER — AZITHROMYCIN 250 MG PO TABS: ORAL_TABLET | ORAL | 0 refills | Status: DC

## 2022-01-31 NOTE — Progress Notes (Signed)
Numeric Pain Rating Scale and Functional Assessment Average Pain 6   In the last MONTH (on 0-10 scale) has pain interfered with the following?  1. General activity like being  able to carry out your everyday physical activities such as walking, climbing stairs, carrying groceries, or moving a chair?  Rating(9)   +Driver, -BT, -Dye Allergies.    Pain in on back of neck mostly on the left side, into the trapezius, gets numbness occasionally in the fingers if she turns her head the wrong way.

## 2022-01-31 NOTE — Patient Instructions (Signed)

## 2022-01-31 NOTE — Progress Notes (Signed)
   Subjective:    Patient ID: Emily Ward, female    DOB: 22-Apr-1956, 66 y.o.   MRN: [id number]  HPI Here for 6 days of fever to 101 degrees, headache, body aches, ST, and a dry cough. Now for 2 days she has also had sinus pressure and is blowing yellow mucus from the nose.    Review of Systems  Constitutional:  Positive for fever.  HENT:  Positive for congestion, postnasal drip, sinus pressure and sore throat. Negative for ear pain.   Eyes: Negative.   Respiratory:  Positive for cough. Negative for shortness of breath and wheezing.   Gastrointestinal: Negative.       Objective:   Physical Exam Constitutional:      Appearance: Normal appearance. She is not ill-appearing.  HENT:     Right Ear: Tympanic membrane, ear canal and external ear normal.     Left Ear: Tympanic membrane, ear canal and external ear normal.     Nose: Nose normal.     Mouth/Throat:     Pharynx: Oropharynx is clear.  Eyes:     Conjunctiva/sclera: Conjunctivae normal.  Pulmonary:     Effort: Pulmonary effort is normal.     Breath sounds: Normal breath sounds.  Lymphadenopathy:     Cervical: No cervical adenopathy.  Neurological:     Mental Status: She is alert.          Assessment & Plan:  She has a sinusitis which is secondary to a viral URI. Treat with a Zpack. Add Mucinex as needed.  Alysia Penna, MD

## 2022-02-02 ENCOUNTER — Telehealth: Payer: Self-pay | Admitting: Pharmacy Technician

## 2022-02-02 ENCOUNTER — Ambulatory Visit (INDEPENDENT_AMBULATORY_CARE_PROVIDER_SITE_OTHER): Payer: 59

## 2022-02-02 VITALS — BP 113/77 | HR 85 | Temp 98.9°F | Resp 18 | Ht 65.0 in | Wt 116.0 lb

## 2022-02-02 DIAGNOSIS — M810 Age-related osteoporosis without current pathological fracture: Secondary | ICD-10-CM

## 2022-02-02 MED ORDER — DENOSUMAB 60 MG/ML ~~LOC~~ SOSY
60.0000 mg | PREFILLED_SYRINGE | Freq: Once | SUBCUTANEOUS | Status: AC
  Administered 2022-02-02: 60 mg via SUBCUTANEOUS
  Filled 2022-02-02: qty 1

## 2022-02-02 NOTE — Telephone Encounter (Signed)
[employer] card information  Card Number: [id number] Member ID: [id number] Expiration Date:06/02/26 CVC:[id number] [id number] PCN:[id number] Group: [id number]

## 2022-02-02 NOTE — Progress Notes (Signed)
.  Diagnosis: Osteoporosis  Provider:  Marshell Garfinkel, MD  Procedure: Injection  Prolia (Denosumab), Dose: 60 mg, Site: subcutaneous, Number of injections: 1  Discharge: Condition: Good, Destination: Home . AVS Declined  Performed by:  Arnoldo Morale, RN

## 2022-02-06 ENCOUNTER — Ambulatory Visit: Payer: 59 | Attending: Obstetrics & Gynecology | Admitting: Physical Therapy

## 2022-02-06 DIAGNOSIS — R293 Abnormal posture: Secondary | ICD-10-CM | POA: Diagnosis present

## 2022-02-06 DIAGNOSIS — R279 Unspecified lack of coordination: Secondary | ICD-10-CM | POA: Insufficient documentation

## 2022-02-06 DIAGNOSIS — M6281 Muscle weakness (generalized): Secondary | ICD-10-CM | POA: Diagnosis present

## 2022-02-06 NOTE — Therapy (Signed)
OUTPATIENT PHYSICAL THERAPY TREATMENT NOTE   Patient Name: Emily Ward MRN: [id number] DOB:1955/09/06, 66 y.o., female Today's Date: 02/06/2022  PCP: Laurey Morale, MD REFERRING PROVIDER: Megan Salon, MD  END OF SESSION:   PT End of Session - 02/06/22 1441     Visit Number 7    Date for PT Re-Evaluation 03/14/22    Authorization Type [employer]    PT Start Time 1400    PT Stop Time 1440    PT Time Calculation (min) 40 min    Activity Tolerance Patient tolerated treatment well    Behavior During Therapy [facility] for tasks assessed/performed               Past Medical History:  Diagnosis Date   Cervical disc disease 2006   COPD (chronic obstructive pulmonary disease) ([place])    Emphysema lung ([place])    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     Fibromyalgia    sees Dr. 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   Hyperlipidemia    Migraines    MVP (mitral valve prolapse)    no daily medication   Peptic ulcer    no problems   Past Surgical History:  Procedure Laterality Date   CERVICAL LAMINECTOMY  2006   [place]  06/2002   CYSTOSCOPY N/A 09/01/2018   Procedure: CYSTOSCOPY;  Surgeon: Megan Salon, MD;  Location: [facility];  Service: Gynecology;  Laterality: N/A;   DILATATION & CURETTAGE/HYSTEROSCOPY WITH MYOSURE N/A 04/12/2017   Procedure: DILATATION & CURETTAGE/HYSTEROSCOPY;  Surgeon: Megan Salon, MD;  Location: [facility];  Service: Gynecology;  Laterality: N/A;   facet joint block  03/30/2021   per patient, performed by Dr. Ernestina Patches   HYSTEROSCOPY     years ago with Dr Radene Knee-? [place] SALPINGECTOMY Bilateral 09/01/2018   Procedure: TOTAL LAPAROSCOPIC HYSTERECTOMY WITH SALPINGECTOMY  BSO, REPAIR OF PERINEAL LACERATION;  Surgeon: Megan Salon, MD;  Location: [facility];  Service: Gynecology;  Laterality: Bilateral;  possible BSO   URETHRAL DILATION     Patient Active Problem List   Diagnosis Date Noted   Hyperparathyroidism ([place]) 10/18/2020   Osteoporosis 10/18/2020   Concussion 10/27/2018   Postmenopausal bleeding 09/01/2018   MGUS (monoclonal gammopathy of unknown significance) 02/13/2018   COPD with emphysema ([place]) 10/04/2017   Palpitations 07/30/2017   Shortness of breath 07/30/2017   Hormone replacement therapy (HRT) 03/30/2017   Former smoker 03/30/2017   Other fatigue 06/30/2016   Insomnia 06/30/2016   DJD (degenerative joint disease), cervical 06/30/2016   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/2016   IBS (irritable bowel syndrome) 06/30/2016   Interstitial cystitis 06/30/2016   SHOULDER PAIN, RIGHT 09/05/2010   GERD 03/24/2009   SACROILIAC STRAIN 03/24/2009   Atypical chest pain 01/20/2008   HEMATURIA UNSPECIFIED 10/14/2007   FIBROMYALGIA 07/03/2007   HYPERLIPIDEMIA NEC/NOS 05/16/2007   HEADACHE 05/09/2007    REFERRING DIAG: N39.3 (ICD-10-CM) - Stress incontinence of urine  THERAPY DIAG:  Muscle weakness (generalized)  Abnormal posture  Unspecified lack of coordination  PERTINENT HISTORY: COPD, cervical disc disease, emphysema, fibromyalgia, heart murmur, hepatitis, HLD, MVP, migraines, CERVICAL LAMINECTOMY, CESAREAN SECTION, CHOLECYSTECTOMY, HYSTEROSCOPY, URETHRAL DILATION Sexual abuse: No  PRECAUTIONS: osteoporosis, hepatitis   SUBJECTIVE: Pt reports she had been sick last  week for several days but now feeling better. Pt had "facet block" done last week as well and this has helped her pain a lot. Did have small amounts of leakage with coughing while sick; other than this and when she wasn't sick hasn't seen leakage. Now consistently urinating no more than every 2 hours, sometimes feels much longer than this ~3-4 hours if doing something.  PAIN:  Are you having pain? NO   OBJECTIVE:  (objective measures completed at initial evaluation unless otherwise dated)   OBJECTIVE:    DIAGNOSTIC FINDINGS:      COGNITION:            Overall cognitive status: Within functional limits for tasks assessed                          SENSATION:            Light touch: Appears intact            Proprioception: Appears intact   MUSCLE LENGTH: Bil hamstrings and adductors limited by 25%     POSTURE:  Rounded shoulders, posterior pelvic tilt   LUMBARAROM/PROM   Bil side bending and rotation limited by 25%   LE ROM: Bil LE WFL   LE MMT:   Bil hip abduction 3/5; all other hips 3+/5; knees 4/5, ankles 5/5   PELVIC MMT:   MMT   12/13/2021 01/04/2022   Vaginal 2/5; 4s holds; 4 reps 3/5 (with NMRE quick release and cues); 10s holds; 3 reps  Internal Anal Sphincter     External Anal Sphincter     Puborectalis     Diastasis Recti     (Blank rows = not tested)         PALPATION:   General  tightness felt at bil upper abdominal quadrants and at diaphragm                  External Perineal Exam [facility]                             Internal Pelvic Floor no TTP   TONE: Mildly increased   PROLAPSE: None in hooklying   TODAY'S TREATMENT   02/06/2022: Nutstep 3 mins L6 and 2 mins L5 (needed to reduce resistance due to fatigue) Thoracic opening in supine 1 min Sidelying hip abduction with ball press x10 stopped at 10 each instead of 20 as pt reported too fatigued) Alt opp hand/knee ball press 2x10  Bridges with blue band x10 Sit to stand from mat 10# 2x10 [employer] carry 7#, 4# 500'  (pt tolerated well, did have symptoms of urge to leak but reports able to engage pelvic floor  and relief felt without leakage)  01/23/2022: Nutstep 5 mins L4 Bridge with ball squeeze x20 Hooklying opp hand/knee ball press x20 Thoracic opener for extension and 360 breathing 1 min 2x10 sidelying ball press and hip abduction Squats 2x15 body weight Hip machine: abduction, adduction 40# x10  each Cat/cow x10  01/16/2022: Nustep 5 mins L3 Squats x15 body weight Bridge with ball squeeze x20 Knee hovers with ball press x8 each Trunk sidelying openers on small purple ball 2x30s each side and 1 min in supine for thoracic extension all with 360 breathing Rib mobility in supine x10 with diaphragmatic breathing and lower ribs over pressure for exhale with tightness felt at Rt side more so than Lt but improved after this  and pt reported she felt much better, "I can breathe better".   01/09/2022: 2x10 bridges with ball squeeze 2x10 sidelying ball press and hip abduction 2x10 Hooklying opp hand/knee press All exercises cued for breathing and pelvic floor coordination for improved pelvic floor strengthening.   01/04/2022: Manual work - internal vaginal treatment with pt consent 3x10 pelvic floor contraction, quick flicks, and S31 isometric holds for 10s. Pt benefited from VC and tactile cues for technique, movement pattern, activation, and relaxation for contractions and NMRE used for this coordination training and quick release techniques.    12/28/2021:  Manual work at abdomen with soft tissue and fascial release completed. Initially soft gentle massage to decrease tension throughout abdomen, progressed to indirect fascial release, progressed to direct as pt tolerated without increase pain. Pt demonstrated decreased tension and improved tissue mobility and reported less pain. PT also provided x10 rib mobilization with pt's breathing cycles for decreased restrictions. Pt tolerated well.  Moist heat at back through treatment with x5 layers of cloth, skin inspected after treatment no redness or irritations noted.   EVAL 12/13/2021 Examination completed, findings reviewed, pt educated on POC, HEP. Pt motivated to participate in PT and agreeable to attempt recommendations.        PATIENT EDUCATION:  Education details: [id number] Person educated: Patient Education method: Consulting civil engineer,  Demonstration, Corporate treasurer cues, Verbal cues, and Handouts Education comprehension: verbalized understanding and returned demonstration     HOME EXERCISE PROGRAM: [id number]   ASSESSMENT:   CLINICAL IMPRESSION: Patient presents to clinic reporting continued improvement with leakage with exception of when she was sick as she was coughing a lot/repetitively for several days and had a little leakage with this. Pt session focused on hip and core strengthening with coordination of breathing mechanics  for decreased strain at pelvic floor and decreased leakage. Pt tolerated well but fatigued after not being able to do a lot in the past week. Pt would benefit from additional PT to further address deficits.       OBJECTIVE IMPAIRMENTS decreased coordination, decreased endurance, decreased strength, increased fascial restrictions, impaired flexibility, improper body mechanics, and postural dysfunction.    ACTIVITY LIMITATIONS community activity.    PERSONAL FACTORS 1 comorbidity: vaginal/c-section history  are also affecting patient's functional outcome.      REHAB POTENTIAL: Good   CLINICAL DECISION MAKING: Stable/uncomplicated   EVALUATION COMPLEXITY: Low     GOALS: Goals reviewed with patient? Yes   SHORT TERM GOALS: Target date: 01/10/2022   Pt to be I with HEP. Baseline: Goal status: MET   2.  Pt to report no more than 3 pads used per day to demonstrate decreased leakage amounts.  Baseline:  Goal status: MET (does change pads 3-4 times per day but    3.  Pt to report good voiding mechanics without cues I to improve ability to fully empty and decrease leakage.  Baseline:  Goal status: MET   4.  Pt to demonstrate 4/5 bil hip strength for improved pelvic stability Baseline:  Goal status: MET   5.  Pt to demonstrate 3/5 pelvic floor strength for decreased leakage.  Baseline:  Goal status: MET       LONG TERM GOALS: Target date: 03/14/2022   Pt to be I with advanced  HEP. Baseline:  Goal status: ON GOING   2.   Pt to report no more than 1 pad used per day to demonstrate decreased leakage amounts.  Baseline:  Goal status: ON GOING   3.  Pt  to demonstrate 4/5 pelvic floor strength and ability to fully relax after strengthening for decreased leakage.  Baseline:  Goal status: ON GOING   4.  Pt to report ability to hold urine 3 hours between voids without leakage for improved QOL.  Baseline:  Goal status: ON GOING       PLAN: PT FREQUENCY: 1x/week   PT DURATION:  10 sessions   PLANNED INTERVENTIONS: Therapeutic exercises, Therapeutic activity, Neuromuscular re-education, Patient/Family education, Joint mobilization, Spinal mobilization, Cryotherapy, Moist heat, Manual lymph drainage, scar mobilization, Taping, Biofeedback, and Manual therapy   PLAN FOR NEXT SESSION: strengthening with coordinating pelvic floor    Stacy Gardner, PT, DPT 06/06/232:42 PM

## 2022-02-08 NOTE — Procedures (Signed)
Cervical Facet Joint Intra-Articular Injection with Fluoroscopic Guidance  Patient: Emily Ward      Date of Birth: [birth year] MRN: [id number] PCP: Laurey Morale, MD      Visit Date: 01/31/2022   Universal Protocol:    Date/Time: 06/08/236:32 AM  Consent Given By: the patient  Position: PRONE  Additional Comments: Vital signs were monitored before and after the procedure. Patient was prepped and draped in the usual sterile fashion. The correct patient, procedure, and site was verified.   Injection Procedure Details:  Procedure Site One Meds Administered:  Meds ordered this encounter  Medications   methylPREDNISolone acetate (DEPO-MEDROL) injection 80 mg     Laterality: Left  Location/Site:  C6-7  Needle size: 25 G  Needle type: Spinal  Needle Placement: Articular  Findings:  -Contrast Used: 0.5 mL iohexol 180 mg iodine/mL   -Comments: Excellent flow of contrast producing a partial arthrogram.  Procedure Details: The region overlying the facet joints mentioned above were localized under fluoroscopic visualization. The needle was inserted down to the level of the lateral mass of the superior articular process of the facet joint to be injected. Then, the needle was "walked off" inferiorly into the lateral aspect of the facet joint. Bi-planar images were used for confirming placement and spot radiographs were documented.  A 0.25 ml volume of Omnipaque-240 was injected into the facet joint and a standard partial arthrogram was obtained. Radiographs were obtained of the arthrogram. A 0.5 ml. volume of the steroid/anesthetic solution was injected into the joint. This procedure was repeated for each facet joint injected.   Additional Comments:  The patient tolerated the procedure well Dressing: Band-Aid    Post-procedure details: Patient was observed during the procedure. Post-procedure instructions were reviewed.  Patient left the clinic in stable  condition.

## 2022-02-08 NOTE — Progress Notes (Signed)
Emily Ward - 66 y.o. female MRN [id number]  Date of birth: 1956/08/31  Office Visit Note: Visit Date: 01/31/2022 PCP: Laurey Morale, MD Referred by: Magnus Sinning, MD  Subjective: Chief Complaint  Patient presents with   Neck - Pain   HPI:  Emily Ward is a 66 y.o. female who comes in today for planned repeat Left C6-7 Cervical facet/medial branch block with fluoroscopic guidance.  The patient has failed conservative care including home exercise, medications, time and activity modification.  This injection will be diagnostic and hopefully therapeutic.  Please see requesting physician notes for further details and justification.  Exam shows concordant neck pain with facet joint loading and extension. Patient received more than 80% pain relief from prior injection.     Referring:Dr. Abel Presto Deveshwar   ROS Otherwise per HPI.  Assessment & Plan: Visit Diagnoses:    ICD-10-CM   1. Cervical spondylosis without myelopathy  M47.812 XR C-ARM NO REPORT    Facet Injection    methylPREDNISolone acetate (DEPO-MEDROL) injection 80 mg      Plan: No additional findings.   Meds & Orders:  Meds ordered this encounter  Medications   methylPREDNISolone acetate (DEPO-MEDROL) injection 80 mg    Orders Placed This Encounter  Procedures   Facet Injection   XR C-ARM NO REPORT    Follow-up: Return if symptoms worsen or fail to improve.   Procedures: No procedures performed  Cervical Facet Joint Intra-Articular Injection with Fluoroscopic Guidance  Patient: Emily Ward      Date of Birth: 1955/12/25 MRN: [id number] PCP: Laurey Morale, MD      Visit Date: 01/31/2022   Universal Protocol:    Date/Time: 06/08/236:32 AM  Consent Given By: the patient  Position: PRONE  Additional Comments: Vital signs were monitored before and after the procedure. Patient was prepped and draped in the usual sterile fashion. The correct patient, procedure, and  site was verified.   Injection Procedure Details:  Procedure Site One Meds Administered:  Meds ordered this encounter  Medications   methylPREDNISolone acetate (DEPO-MEDROL) injection 80 mg     Laterality: Left  Location/Site:  C6-7  Needle size: 25 G  Needle type: Spinal  Needle Placement: Articular  Findings:  -Contrast Used: 0.5 mL iohexol 180 mg iodine/mL   -Comments: Excellent flow of contrast producing a partial arthrogram.  Procedure Details: The region overlying the facet joints mentioned above were localized under fluoroscopic visualization. The needle was inserted down to the level of the lateral mass of the superior articular process of the facet joint to be injected. Then, the needle was "walked off" inferiorly into the lateral aspect of the facet joint. Bi-planar images were used for confirming placement and spot radiographs were documented.  A 0.25 ml volume of Omnipaque-240 was injected into the facet joint and a standard partial arthrogram was obtained. Radiographs were obtained of the arthrogram. A 0.5 ml. volume of the steroid/anesthetic solution was injected into the joint. This procedure was repeated for each facet joint injected.   Additional Comments:  The patient tolerated the procedure well Dressing: Band-Aid    Post-procedure details: Patient was observed during the procedure. Post-procedure instructions were reviewed.  Patient left the clinic in stable condition.   Clinical History: No specialty comments available.     Objective:  VS:  HT:    WT:   BMI:     BP:102/69  HR:80bpm  TEMP: ( )  RESP:  Physical Exam Vitals and nursing note  reviewed.  Constitutional:      General: She is not in acute distress.    Appearance: Normal appearance. She is not ill-appearing.  HENT:     Head: Normocephalic and atraumatic.     Right Ear: External ear normal.     Left Ear: External ear normal.  Eyes:     Extraocular Movements: Extraocular  movements intact.  Cardiovascular:     Rate and Rhythm: Normal rate.     Pulses: Normal pulses.  Musculoskeletal:     Cervical back: Tenderness present. No rigidity.     Right lower leg: No edema.     Left lower leg: No edema.     Comments: Patient has good strength in the upper extremities including 5 out of 5 strength in wrist extension long finger flexion and APB.  There is no atrophy of the hands intrinsically.  There is a negative Hoffmann's test.   Lymphadenopathy:     Cervical: No cervical adenopathy.  Skin:    Findings: No erythema, lesion or rash.  Neurological:     General: No focal deficit present.     Mental Status: She is alert and oriented to person, place, and time.     Sensory: No sensory deficit.     Motor: No weakness or abnormal muscle tone.     Coordination: Coordination normal.  Psychiatric:        Mood and Affect: Mood normal.        Behavior: Behavior normal.      Imaging: No results found.

## 2022-02-13 ENCOUNTER — Ambulatory Visit: Payer: 59 | Admitting: Physical Therapy

## 2022-02-13 DIAGNOSIS — M6281 Muscle weakness (generalized): Secondary | ICD-10-CM | POA: Diagnosis not present

## 2022-02-13 DIAGNOSIS — R293 Abnormal posture: Secondary | ICD-10-CM

## 2022-02-13 DIAGNOSIS — R279 Unspecified lack of coordination: Secondary | ICD-10-CM

## 2022-02-13 NOTE — Therapy (Signed)
OUTPATIENT PHYSICAL THERAPY TREATMENT NOTE   Patient Name: Emily Ward MRN: [id number] DOB:14-Jun-1956, 66 y.o., female Today's Date: 02/13/2022  PCP: Laurey Morale, MD REFERRING PROVIDER: Megan Salon, MD  END OF SESSION:   PT End of Session - 02/13/22 1609     Visit Number 8    Date for PT Re-Evaluation 03/14/22    Authorization Type U[employer]    PT Start Time 1615    PT Stop Time 1655    PT Time Calculation (min) 40 min    Activity Tolerance Patient tolerated treatment well    Behavior During Therapy [facility] [address] for tasks assessed/performed               Past Medical History:  Diagnosis Date   Cervical disc disease 2006   COPD (chronic obstructive pulmonary disease) ([place])    Emphysema lung ([place])    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     Fibromyalgia    sees Dr. 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   Hyperlipidemia    Migraines    MVP (mitral valve prolapse)    no daily medication   Peptic ulcer    no problems   Past Surgical History:  Procedure Laterality Date   CERVICAL LAMINECTOMY  2006   [place]  06/2002   CYSTOSCOPY N/A 09/01/2018   Procedure: CYSTOSCOPY;  Surgeon: Megan Salon, MD;  Location: [facility];  Service: Gynecology;  Laterality: N/A;   DILATATION & CURETTAGE/HYSTEROSCOPY WITH MYOSURE N/A 04/12/2017   Procedure: DILATATION & CURETTAGE/HYSTEROSCOPY;  Surgeon: Megan Salon, MD;  Location: [facility];  Service: Gynecology;  Laterality: N/A;   facet joint block  03/30/2021   per patient, performed by Dr. Ernestina Patches   HYSTEROSCOPY     years ago with Dr Radene Knee-? [place] SALPINGECTOMY Bilateral 09/01/2018   Procedure: TOTAL LAPAROSCOPIC HYSTERECTOMY WITH SALPINGECTOMY  BSO, REPAIR OF PERINEAL LACERATION;  Surgeon: Megan Salon, MD;  Location: [facility];  Service: Gynecology;  Laterality: Bilateral;  possible BSO   URETHRAL DILATION     Patient Active Problem List   Diagnosis Date Noted   Hyperparathyroidism ([place]) 10/18/2020   Osteoporosis 10/18/2020   Concussion 10/27/2018   Postmenopausal bleeding 09/01/2018   MGUS (monoclonal gammopathy of unknown significance) 02/13/2018   COPD with emphysema ([place]) 10/04/2017   Palpitations 07/30/2017   Shortness of breath 07/30/2017   Hormone replacement therapy (HRT) 03/30/2017   Former smoker 03/30/2017   Other fatigue 06/30/2016   Insomnia 06/30/2016   DJD (degenerative joint disease), cervical 06/30/2016   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/2016   IBS (irritable bowel syndrome) 06/30/2016   Interstitial cystitis 06/30/2016   SHOULDER PAIN, RIGHT 09/05/2010   GERD 03/24/2009   SACROILIAC STRAIN 03/24/2009   Atypical chest pain 01/20/2008   HEMATURIA UNSPECIFIED 10/14/2007   FIBROMYALGIA 07/03/2007   HYPERLIPIDEMIA NEC/NOS 05/16/2007   HEADACHE 05/09/2007    REFERRING DIAG: N39.3 (ICD-10-CM) - Stress incontinence of urine  THERAPY DIAG:  Muscle weakness (generalized)  Abnormal posture  Unspecified lack of coordination  PERTINENT HISTORY: COPD, cervical disc disease, emphysema, fibromyalgia, heart murmur, hepatitis, HLD, MVP, migraines, CERVICAL LAMINECTOMY, CESAREAN SECTION, CHOLECYSTECTOMY, HYSTEROSCOPY, URETHRAL DILATION Sexual abuse: No  PRECAUTIONS: osteoporosis, hepatitis   SUBJECTIVE: Pt reports she has been doing much  better, went to the beach and had no leakage. Pt reports, "I am really pleased with where things are." PAIN:  Are you having pain? NO   OBJECTIVE: (objective measures completed at initial evaluation unless otherwise dated)   OBJECTIVE:    DIAGNOSTIC FINDINGS:      COGNITION:            Overall cognitive status: Within functional limits for tasks assessed                          SENSATION:             Light touch: Appears intact            Proprioception: Appears intact   MUSCLE LENGTH: Bil hamstrings and adductors limited by 25%     POSTURE:  Rounded shoulders, posterior pelvic tilt   LUMBARAROM/PROM   Bil side bending and rotation limited by 25%   LE ROM: Bil LE WFL   LE MMT:   Bil hip abduction 3/5; all other hips 3+/5; knees 4/5, ankles 5/5   PELVIC MMT:   MMT   12/13/2021 01/04/2022   Vaginal 2/5; 4s holds; 4 reps 3/5 (with NMRE quick release and cues); 10s holds; 3 reps  Internal Anal Sphincter     External Anal Sphincter     Puborectalis     Diastasis Recti     (Blank rows = not tested)         PALPATION:   General  tightness felt at bil upper abdominal quadrants and at diaphragm                  External Perineal Exam [facility]                             Internal Pelvic Floor no TTP   TONE: Mildly increased   PROLAPSE: None in hooklying   TODAY'S TREATMENT  02/13/2022  NuStep 3 mins L7 2 mins L2 (needed to reduce resistance due to fatigue) Thoracic opening in supine 1 min Squats 15# x10 Farmers carry 9# and 5# 350'  02/06/2022: Nutstep 3 mins L6 and 2 mins L5 (needed to reduce resistance due to fatigue) Thoracic opening in supine 1 min Sidelying hip abduction with ball press x10 stopped at 10 each instead of 20 as pt reported too fatigued) Alt opp hand/knee ball press 2x10  Bridges with blue band x10 Sit to stand from mat 10# 2x10 Farmers carry 7#, 4# 500'  (pt tolerated well, did have symptoms of urge to leak but reports able to engage pelvic floor  and relief felt without leakage)  01/23/2022: Nutstep 5 mins L4 Bridge with ball squeeze x20 Hooklying opp hand/knee ball press x20 Thoracic opener for extension and 360 breathing 1 min 2x10 sidelying ball press and hip abduction Squats 2x15 body weight Hip machine: abduction, adduction 40# x10 each Cat/cow x10    PATIENT EDUCATION:  Education details: [id number] Person educated: Patient Education  method: Consulting civil engineer, Demonstration, Corporate treasurer cues, Verbal cues, and Handouts Education comprehension: verbalized understanding and returned demonstration     HOME EXERCISE PROGRAM: [id number]   ASSESSMENT:   CLINICAL IMPRESSION: Patient presents to clinic reporting she has felt much better and no longer having symptoms very pleased with progress. Pt session focused on hip and core strengthening with pelvic floor and breathing coordination. Pt demonstrated good technique throughout without need of cues and denied leakage. Pt  requests discharge today as she feels much better and no longer having symptoms, confident in discharge today and reports she feels she is able to continue with HEP and recommendations given throughout PT sessions. Pt has met all goals except internal strength not retested today as pt deferred. This will serve as pt's DC from PT.      OBJECTIVE IMPAIRMENTS decreased coordination, decreased endurance, decreased strength, increased fascial restrictions, impaired flexibility, improper body mechanics, and postural dysfunction.    ACTIVITY LIMITATIONS community activity.    PERSONAL FACTORS 1 comorbidity: vaginal/c-section history  are also affecting patient's functional outcome.      REHAB POTENTIAL: Good   CLINICAL DECISION MAKING: Stable/uncomplicated   EVALUATION COMPLEXITY: Low     GOALS: Goals reviewed with patient? Yes   SHORT TERM GOALS: Target date: 01/10/2022   Pt to be I with HEP. Baseline: Goal status: MET   2.  Pt to report no more than 3 pads used per day to demonstrate decreased leakage amounts.  Baseline:  Goal status: MET (does change pads 3-4 times per day but    3.  Pt to report good voiding mechanics without cues I to improve ability to fully empty and decrease leakage.  Baseline:  Goal status: MET   4.  Pt to demonstrate 4/5 bil hip strength for improved pelvic stability Baseline:  Goal status: MET   5.  Pt to demonstrate 3/5 pelvic  floor strength for decreased leakage.  Baseline:  Goal status: MET       LONG TERM GOALS: Target date: 03/14/2022   Pt to be I with advanced HEP. Baseline:  Goal status: MET    2.   Pt to report no more than 1 pad used per day to demonstrate decreased leakage amounts.  Baseline:  Goal status: MET   3.  Pt to demonstrate 4/5 pelvic floor strength and ability to fully relax after strengthening for decreased leakage.  Baseline:  Goal status: pt denied internal reassessment as she is no longer having symptoms.    4.  Pt to report ability to hold urine 3 hours between voids without leakage for improved QOL.  Baseline:  Goal status: MET       PLAN: PT FREQUENCY: 1x/week   PT DURATION:  10 sessions   PLANNED INTERVENTIONS: Therapeutic exercises, Therapeutic activity, Neuromuscular re-education, Patient/Family education, Joint mobilization, Spinal mobilization, Cryotherapy, Moist heat, Manual lymph drainage, scar mobilization, Taping, Biofeedback, and Manual therapy   PLAN FOR NEXT SESSION:   PHYSICAL THERAPY DISCHARGE SUMMARY  Visits from Start of Care: 8  Current functional level related to goals / functional outcomes: All goals met which exception of LTG of internal pelvic floor strength as pt deferred as she is no longer having symptoms.    Remaining deficits: None per pt   Education / Equipment: HEP   Patient agrees to discharge. Patient goals were met. Patient is being discharged due to being pleased with the current functional level.    Stacy Gardner, PT, DPT 06/13/234:55 PM

## 2022-02-20 ENCOUNTER — Encounter: Payer: 59 | Admitting: Physical Therapy

## 2022-02-23 ENCOUNTER — Ambulatory Visit: Payer: 59 | Admitting: Family Medicine

## 2022-02-23 ENCOUNTER — Ambulatory Visit (INDEPENDENT_AMBULATORY_CARE_PROVIDER_SITE_OTHER): Payer: 59

## 2022-02-23 VITALS — BP 104/70 | HR 84 | Temp 97.9°F | Wt 109.6 lb

## 2022-02-23 DIAGNOSIS — R0789 Other chest pain: Secondary | ICD-10-CM

## 2022-02-23 DIAGNOSIS — J449 Chronic obstructive pulmonary disease, unspecified: Secondary | ICD-10-CM | POA: Diagnosis not present

## 2022-02-23 NOTE — Progress Notes (Signed)
Subjective:    Patient ID: Emily Ward, female    DOB: 04-13-1956, 66 y.o.   MRN: [id number]  Chief Complaint  Patient presents with   Rib Injury    Has had syncope before, has COPD, has tightness in ribs, hurts to take a deep breath for a week. Stomach will be real tight, and just has a weird feeling. Wants to make sure it is not cardiac related, also has tingling in left ring and pinky finger.     HPI Patient is a 66 yo female with PMH SIG COPD, emphysema, cervical disc disease, fibromyalgia, migraines, HLD, MVP, hepatitis who is followed by Dr. Sarajane Jews and seen today for ongoing concern. Pt with chest tightness in lower chest/upper abd x wks.  States "feels like can't expand lungs".  Patient states she is in physical therapy which is helping to loosen up muscles and abdomen and chest.  Endorses history of COPD but denies current exacerbation.   Also notes feeling giddiness in the last few days.  Past Medical History:  Diagnosis Date   Cervical disc disease 2006   COPD (chronic obstructive pulmonary disease) ([place])    Emphysema lung ([place])    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     Fibromyalgia    sees Dr. 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   Hyperlipidemia    Migraines    MVP (mitral valve prolapse)    no daily medication   Peptic ulcer    no problems    Allergies  Allergen Reactions   Divalproex Sodium     Drug inducted hepatitis Other reaction(s): Unknown Other reaction(s): Unknown   Nsaids     Upset stomach    ROS General: Denies fever, chills, night sweats, changes in weight, changes in appetite + giddiness HEENT: Denies headaches, ear pain, changes in vision, rhinorrhea, sore throat CV: Denies CP, palpitations, SOB, orthopnea  Pulm: Denies SOB, cough, wheezing  + chest tightness GI: Denies abdominal pain, nausea, vomiting, diarrhea, constipation GU: Denies dysuria, hematuria,  frequency, vaginal discharge Msk: Denies muscle cramps, joint pains + diffuse pain 2/2 fibromyalgia Neuro: Denies weakness, numbness, tingling Skin: Denies rashes, bruising Psych: Denies depression, anxiety, hallucinations     Objective:    Blood pressure 104/70, pulse 84, temperature 97.9 F (36.6 C), temperature source Oral, weight 109 lb 9.6 oz (49.7 kg), last menstrual period 09/03/2000, SpO2 99 %.  Gen. Pleasant, well-nourished, in no distress, normal affect   HEENT: [place]/AT, face symmetric, conjunctiva clear, no scleral icterus, PERRLA, EOMI, nares patent without drainage Lungs: no accessory muscle use, CTAB, no wheezes or rales Cardiovascular: RRR, no m/r/g, no peripheral edema Musculoskeletal: No TTP of chest wall.  No deformities, no cyanosis or clubbing, normal tone Neuro:  A&Ox3, CN II-XII intact, normal gait Skin:  Warm, no lesions/ rash   Wt Readings from Last 3 Encounters:  02/02/22 116 lb (52.6 kg)  01/31/22 112 lb (50.8 kg)  12/26/21 111 lb 6.4 oz (50.5 kg)    Lab Results  Component Value Date   WBC 5.0 01/01/2022   HGB 14.0 01/01/2022   HCT 41.3 01/01/2022   PLT 204 01/01/2022   GLUCOSE 95 01/01/2022   CHOL 253 (H) 09/08/2021   TRIG 60.0 09/08/2021   HDL 92.30 09/08/2021   LDLDIRECT 153.5 04/03/2012   LDLCALC 148 (H) 09/08/2021   ALT 9 01/01/2022   AST 21 01/01/2022  NA 138 01/01/2022   K 4.5 01/01/2022   CL 103 01/01/2022   CREATININE 0.65 01/01/2022   BUN 17 01/01/2022   CO2 27 01/01/2022   TSH 1.97 09/08/2021   HGBA1C 5.5 09/08/2021    Assessment/Plan:  Chronic obstructive pulmonary disease, unspecified COPD type ([place]) - Plan: DG Chest 2 View  Chest tightness - Plan: DG Chest 2 View  Based on current symptoms discussed concern for fibromyalgia flare, COPD exacerbation though without productive cough, increased sputum production, or change in sputum color.  Also consider costochondritis or intercostal muscle strain though no TTP on exam of  chest wall.  Symptoms less likely due to pleural effusion or pericardial effusion based on exam.  Discussed obtaining CXR to rule out above.  Albuterol inhaler as needed.  Continue PT.  Given precautions.  F/u as needed with PCP for continued or worsening symptoms.  Grier Mitts, MD

## 2022-02-26 ENCOUNTER — Ambulatory Visit: Payer: 59 | Admitting: Physical Therapy

## 2022-03-14 ENCOUNTER — Encounter: Payer: Self-pay | Admitting: Family Medicine

## 2022-03-20 ENCOUNTER — Encounter: Payer: Self-pay | Admitting: Pulmonary Disease

## 2022-03-20 ENCOUNTER — Ambulatory Visit: Payer: 59 | Admitting: Pulmonary Disease

## 2022-03-20 VITALS — BP 116/62 | HR 90 | Temp 98.2°F | Ht 65.0 in | Wt 111.8 lb

## 2022-03-20 DIAGNOSIS — R0602 Shortness of breath: Secondary | ICD-10-CM

## 2022-03-20 MED ORDER — TRELEGY ELLIPTA 200-62.5-25 MCG/ACT IN AEPB: 1.0000 | INHALATION_SPRAY | Freq: Every day | RESPIRATORY_TRACT | 2 refills | Status: DC

## 2022-03-20 MED ORDER — IPRATROPIUM-ALBUTEROL 0.5-2.5 (3) MG/3ML IN SOLN: 3.0000 mL | Freq: Four times a day (QID) | RESPIRATORY_TRACT | 11 refills | Status: DC | PRN

## 2022-03-20 NOTE — Progress Notes (Signed)
Emily Ward    [id number]    [birth year]  Primary Care Physician:Fry, Ishmael Holter, MD  Referring Physician: Laurey Morale, MD [place],  [place] [address]  Chief complaint: Follow-up for severe COPD  HPI: 66 year old with history of mitral valve prolapse, allergies, chronic headaches Here for follow-up of severe COPD Initially evaluated in December 2019.  Started on Anoro inhaler at that time Finished pulmonary rehab in 2020  Underwent total hysterectomy with salpingectomy on 09/01/2018 under general anesthesia without any respiratory issues.  Contracted COVID-19 in November 2020.  Symptoms are mostly fever, joint pain, headache and fatigue.  She did not have any respiratory symptoms except for throat irritation.  Did not need isolation  Diagnosed with COVID again in early 2022.  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 Smoking history: 35-pack-year smoker.  Quit in 2014 Travel history: No significant travel history Relevant family history: Mom had emphysema Emily Ward was a smoker)  Interim history: Continues on Anoro inhaler She is complaining of stent tightness in the lower chest, difficulty taking a deep breath, dyspnea for the past 1 month.  She was evaluated by primary care on 6/23 and got a chest x-ray which showed hyperinflation.  As she has continues to have persistent symptoms wants her lungs checked out.  Outpatient Encounter Medications as of 03/20/2022  Medication Sig   albuterol (VENTOLIN HFA) 108 (90 Base) MCG/ACT inhaler Inhale 1-2 puffs into the lungs every 6 (six) hours as needed for wheezing or shortness of breath.   ANORO ELLIPTA 62.5-25 MCG/ACT AEPB INHALE 1 PUFF INTO THE LUNGS DAILY   butalbital-acetaminophen-caffeine  (FIORICET) 50-325-40 MG tablet TAKE 1 TABLET BY MOUTH EVERY 6 HOURS AS NEEDED   CALCIUM PO Take 1 tablet by mouth daily.   cetirizine (ZYRTEC) 10 MG tablet Take 10 mg by mouth daily as needed for allergies (spring time).   chlorzoxazone (PARAFON) 500 MG tablet TAKE 1 TABLET BY MOUTH FOUR TIMES DAILY AS NEEDED FOR MUSCLE SPASMS   cholecalciferol (VITAMIN D) 1000 units tablet Take 2,000 Units by mouth daily.    Cyanocobalamin (VITAMIN B-12 PO) Take 1 tablet by mouth daily.    cyclobenzaprine (FLEXERIL) 10 MG tablet TAKE 1 TABLET BY MOUTH THREE TIMES DAILY AS NEEDED FOR MUSCLE SPASMS   denosumab (PROLIA) 60 MG/ML SOSY injection Inject 60 mg into the skin every 6 (six) months.   estradiol (ESTRACE) 1 MG tablet Take 1 tablet (1 mg total) by mouth daily.   meloxicam (MOBIC) 7.5 MG tablet TAKE 1 TABLET(7.5 MG) BY MOUTH DAILY   zolmitriptan (ZOMIG) 5 MG tablet TAKE 1 TABLET BY MOUTH AS NEEDED AS DIRECTED   zolpidem (AMBIEN) 10 MG tablet TAKE 1 TABLET(10 MG) BY MOUTH AT BEDTIME   [DISCONTINUED] azithromycin (ZITHROMAX Z-PAK) 250 MG tablet As directed   [DISCONTINUED] DULoxetine (CYMBALTA) 20 MG capsule Take 1 capsule (20 mg total) by mouth daily.   No facility-administered encounter medications on file as of 03/20/2022.   Physical Exam: Blood pressure 116/62, pulse 90, temperature 98.2 F (36.8 C), temperature source Oral, height '5\' 5"'$  (1.651 m), weight 111 lb 12.8 oz (50.7 kg), last menstrual period 09/03/2000, SpO2 98 %. Gen:      No acute distress HEENT:  EOMI, sclera anicteric Neck:  No masses; no thyromegaly Lungs:    Clear to auscultation bilaterally; normal respiratory effort CV:         Regular rate and rhythm; no murmurs Abd:      + bowel sounds; soft, non-tender; no palpable masses, no distension Ext:    No edema; adequate peripheral perfusion Skin:      Warm and dry; no rash Neuro: alert and oriented x 3 Psych: normal mood and affect   Data Reviewed: Imaging: Screening CT  04/27/2019-right lower lobe pulmonary nodule, emphysema, linear scarring in the lower lobes.  CT chest 06/13/21- Moderate emphysema, stable lung nodules  Chest x-ray 02/23/2022-hyperinflation with no acute abnormality I have reviewed the images personally.  PFTs: 03/05/2019 FVC 2.22 [64%), FEV1 1.43 (53%),F/F 64, TLC 6.78 [130%], DLCO 9.79 [46%] Severe obstruction with air trapping and hyperinflation Severe diffusion defect  Cardiac: Echo 08/21/17- LVEF 60-65%, normal wall thickness, normal wall motion, normal diastolic function, normal LA size ,trivial MR, normal RV size with low normal RV systolic function, normal IVC.  Labs: CBC 10/22/2018-WBC 6.5, eos 1%, absolute eosinophil count 65 Alpha-1 antitrypsin 08/15/2018-126, PI MM  Assessment:  Severe COPD Has worsening dyspnea with chest tightness and discomfort.  Not really wheezing on examination but could be mild exacerbation.  We will change her Anoro to [employer]  Suspect presentation may be secondary to fibromyalgia, costochondritis but we will get a CT chest to make sure there is nothing else going on.  She is due for lung nodule follow-up CT anyway at this time of the year.    Ex-smoker. Lung nodules Follow on CT scan  Health maintenance Pneumovax 03/23/2019  Plan/Recommendations: - Change Anoro to Trelegy, - CT chest  Emily Garfinkel MD [facility] 03/20/2022, 3:53 PM  CC: Emily Morale, MD

## 2022-03-20 NOTE — Patient Instructions (Signed)
We will change inhalers to Trelegy Call in a nebulizer and DuoNebs as needed We will also get a CT chest without contrast for evaluation of lung nodules Follow-up in 6 months.

## 2022-03-22 NOTE — Progress Notes (Signed)
Office Visit Note  Patient: Emily Ward             Date of Birth: Sep 15, 1955           MRN: [id number]             PCP: Laurey Morale, MD Referring: Laurey Morale, MD Visit Date: 03/26/2022 Occupation: '@GUAROCC' @  Subjective:  Thoracic pain   History of Present Illness: Emily Ward is a 66 y.o. female with history of fibromyalgia and DDD.  Patient was last seen in the office on 12/26/2021 for neck and thoracic pain.  She had x-rays of the thoracic spine which were unremarkable.  She followed up with Dr. Ernestina Patches and had facet joint injections on 01/31/2022 which alleviated her neck and upper trapezius muscle tension and tenderness.  She continues to have persistent thoracic pain which has progressively been worsening over the past 3 months.  She did not have any injury and did not lift any heavy objects prior to the onset of her symptoms.  She was evaluated by Dr. Volanda Napoleon (primary care), and Dr. Vaughan Browner (pulmonology).  She had a CXR which did not reveal any acute abnormalities.  She denies any coughing or shortness of breath.  She has noticed a bandlike tightness across her diaphragm.  She describes the sensation as a bandlike tightness.  At times she states that the symptoms feel like she is wearing a corset.  Her symptoms are exacerbated by positional changes but is relieved with forward flexion usually. She has had intercostal muscle tenderness especially between her lower ribs on both sides.  She has also been experiencing muscle spasms in her trapezius muscles bilaterally.  She has been taking Parafon every 4 hours and flexeril 10 mg at bedtime for muscle spasms.  She is also taking meloxicam 7.5 mg daily with no relief. She has been going to physical therapy but only has temporary relief after each session.       Activities of Daily Living:  Patient reports morning stiffness for 0 minutes.   Patient Reports nocturnal pain.  Difficulty dressing/grooming:  Denies Difficulty climbing stairs: Reports Difficulty getting out of chair: Denies Difficulty using hands for taps, buttons, cutlery, and/or writing: Reports  Review of Systems  Constitutional:  Positive for fatigue.  HENT:  Positive for mouth dryness and nose dryness. Negative for mouth sores.   Eyes:  Positive for dryness. Negative for pain and visual disturbance.  Respiratory:  Negative for cough, hemoptysis and difficulty breathing.   Cardiovascular:  Negative for chest pain, palpitations, hypertension and swelling in legs/feet.  Gastrointestinal:  Negative for blood in stool, constipation and diarrhea.  Endocrine: Negative for increased urination.  Genitourinary:  Negative for painful urination and involuntary urination.  Musculoskeletal:  Positive for joint pain, joint pain, myalgias, muscle tenderness and myalgias. Negative for joint swelling, muscle weakness and morning stiffness.  Skin:  Negative for color change, pallor, rash, hair loss, nodules/bumps, skin tightness, ulcers and sensitivity to sunlight.  Neurological:  Positive for dizziness. Negative for numbness and weakness.  Hematological:  Negative for swollen glands.  Psychiatric/Behavioral:  Positive for sleep disturbance. Negative for depressed mood. The patient is not nervous/anxious.     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   Palpitations  07/30/2017   Shortness of breath 07/30/2017   Hormone replacement therapy (HRT) 03/30/2017   Former smoker 03/30/2017   Other fatigue 06/30/2016   Insomnia 06/30/2016   DJD (degenerative joint disease), cervical 06/30/2016   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/2016   IBS (irritable  bowel syndrome) 06/30/2016   Interstitial cystitis 06/30/2016   SHOULDER PAIN, RIGHT 09/05/2010   GERD 03/24/2009   SACROILIAC STRAIN 03/24/2009   Atypical chest pain 01/20/2008   HEMATURIA UNSPECIFIED 10/14/2007   FIBROMYALGIA 07/03/2007   HYPERLIPIDEMIA NEC/NOS 05/16/2007   HEADACHE 05/09/2007    Past Medical History:  Diagnosis Date   Cervical disc disease 2006   COPD (chronic obstructive pulmonary disease) ([place])    Emphysema lung ([place])    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     Fibromyalgia    sees Dr. 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 brother   Arthritis Son        psoriatic   Cancer Other        breast/fhx   Heart disease Other        fhx   Colon cancer Neg Hx    Esophageal cancer Neg Hx    Rectal cancer Neg Hx    Stomach cancer Neg Hx    Past Surgical History:  Procedure Laterality Date   CERVICAL LAMINECTOMY  2006   [place]   CHOLECYSTECTOMY  06/2002   CYSTOSCOPY N/A 09/01/2018   Procedure: CYSTOSCOPY;  Surgeon: Megan Salon, MD;  Location: [facility];  Service: Gynecology;  Laterality: N/A;   DILATATION & CURETTAGE/HYSTEROSCOPY WITH MYOSURE N/A 04/12/2017   Procedure: DILATATION & CURETTAGE/HYSTEROSCOPY;  Surgeon: Megan Salon, MD;  Location: [facility];  Service: Gynecology;  Laterality: N/A;   facet joint block  03/30/2021   per patient, performed by Dr. Ernestina Patches   facet joint block  01/31/2022   per patient, performed by Dr. Ernestina Patches   HYSTEROSCOPY     years ago with Dr Radene Knee-? [place] SALPINGECTOMY Bilateral 09/01/2018   Procedure: TOTAL LAPAROSCOPIC HYSTERECTOMY WITH SALPINGECTOMY  BSO, REPAIR OF PERINEAL LACERATION;  Surgeon: Megan Salon, MD;  Location: [facility];  Service: Gynecology;  Laterality: Bilateral;  possible BSO   URETHRAL DILATION     Social History   Social History Narrative   Not on file   Immunization History  Administered Date(s) Administered   Influenza Whole 06/30/2007, 05/04/2010   Influenza,inj,Quad PF,6+ Mos 07/12/2017, 05/23/2018, 09/08/2021   Influenza-Unspecified 06/02/2014, 06/03/2016, 06/20/2020   MMR 06/30/2007   Moderna Sars-Covid-2 Vaccination 09/07/2019, 10/12/2019, 07/13/2020, 02/21/2021   Pneumococcal Polysaccharide-23 03/23/2019   Td 09/03/1998, 05/05/2009   Tdap 10/22/2018     Objective: Vital Signs: BP 104/65 (BP Location: Left Arm, Patient Position: Sitting, Cuff Size: Normal)   Pulse 92   Ht '5\' 5"'  (1.651  m)   Wt 112 lb (50.8 kg)   LMP 09/03/2000 (Approximate)   BMI 18.64 kg/m    Physical Exam Vitals and nursing note reviewed.  Constitutional:      Appearance: She is well-developed.  HENT:     Head: Normocephalic and atraumatic.  Eyes:     Conjunctiva/sclera: Conjunctivae normal.  Cardiovascular:     Rate and Rhythm: Normal rate and regular rhythm.     Heart sounds: Normal heart sounds.  Pulmonary:     Effort: Pulmonary effort is normal.     Breath sounds: Normal breath sounds.  Abdominal:     General: Bowel sounds are normal.     Palpations: Abdomen is soft.  Musculoskeletal:     Cervical back: Normal range of motion.  Skin:    General: Skin is warm and dry.     Capillary Refill: Capillary refill takes less than 2 seconds.  Neurological:     Mental Status: She is alert and oriented to person, place, and time.  Psychiatric:        Behavior: Behavior normal.      Musculoskeletal Exam: C-spine has good ROM.  Trapezius muscle tension and tenderness  bilaterally.  Some midline spinal tenderness in the thoracic region.  Shoulder joints, elbow joints, wrist joints, MCPs, PIPs, and DIPs good Rom with no synovitis.  Complete fist formation bilaterally.  Hip joints have good ROM with no groin pain.  Knee joints have good ROM with no warmth or effusion.  Ankle joints have good ROM with no tenderness or joint swelling.   CDAI Exam: CDAI Score: -- Patient Global: --; Provider Global: -- Swollen: --; Tender: -- Joint Exam 03/26/2022   No joint exam has been documented for this visit   There is currently no information documented on the homunculus. Go to the Rheumatology activity and complete the homunculus joint exam.  Investigation: No additional findings.  Imaging: No results found.  Recent Labs: Lab Results  Component Value Date   WBC 5.0 01/01/2022   HGB 14.0 01/01/2022   PLT 204 01/01/2022   NA 138 01/01/2022   K 4.5 01/01/2022   CL 103 01/01/2022   CO2 27 01/01/2022   GLUCOSE 95 01/01/2022   BUN 17 01/01/2022   CREATININE 0.65 01/01/2022   BILITOT 0.4 01/01/2022   ALKPHOS 66 09/08/2021   AST 21 01/01/2022   ALT 9 01/01/2022   PROT 7.0 01/01/2022   PROT 6.9 01/01/2022   ALBUMIN 4.4 09/08/2021   CALCIUM 10.0 01/01/2022   GFRAA >60 10/22/2018    Speciality Comments: Prolia started 2019.  Last injection July 31, 2021.  Patient wants to switch Prolia injections to our office.  Procedures:  No procedures performed Allergies: Divalproex sodium and Nsaids    Assessment / Plan:     Visit Diagnoses: Primary osteoarthritis of both hands: X-rays of both hands were obtained on 02/09/2020 which were consistent with osteoarthritic changes.  No erosive changes noted.  She has PIP and DIP thickening consistent with osteoarthritis of both hands.  CMC joint prominence noted bilaterally.  She experiences intermittent pain and stiffness especially in her PIP joints.  No synovitis was noted.  Discussed the importance of joint  protection and muscle strengthening.  Trapezius muscle spasm: She presents today with trapezius muscle tension and tenderness bilaterally.  She has been experiencing muscle spasms intermittently.  She has been taking Parafon every 4 hours as needed and flexeril 10 mg at bedtime for symptomatic relief.   DDD (degenerative disc  disease), cervical - MRI performed on 05/03/20.  She establish care with Dr. Ernestina Patches in September 2021.  She had facet joint injections on 06/02/2020, 03/30/2021, and 01/31/2022.  Her neck pain and stiffness have improved since having the most recent facet joint injections.  She continues to have trapezius muscle tension and tenderness bilaterally.  Pain in thoracic spine -She presents today with ongoing discomfort in the thoracic region.  Her symptoms initially started about 3 months ago with no injury prior to the onset of symptoms.  She was evaluated by Dr. Dimas Alexandria on 12/26/2021 and had thoracic x-rays which were unremarkable.  She followed up with Dr. Ernestina Patches on 01/31/2022 and underwent C-spine facet joint injections which alleviated her neck pain and stiffness but the thoracic discomfort has persisted.  She was evaluated by Dr. Volanda Napoleon on 02/23/2022 and had a chest x-ray which did not reveal any acute abnormalities.  She was also evaluated by Dr. Vaughan Browner who made several medication changes with no improvement in her symptoms.  She is scheduled for a chest CT on 04/17/2022 but does not feel that it is pulmonary related.  She has not had any increased shortness of breath, pleuritic chest pain, palpitations, angina, or cough. She describes the discomfort as a bandlike sensation and at times it feels as though she is wearing a corset.  She has been experiencing muscle spasms of her diaphragm and at times feels that she has difficulty taking a deep breath due to the discomfort and muscle spasms.  Her symptoms are exacerbated by positional changes but she has some relief with forward flexion.   She states her symptoms are different than costochondritis which she has experienced in the past.  She does have some chest wall tenderness which she feels is due to underlying fibromyalgia.  She has been taking parafon every 4 hours as needed and Flexeril 10 mg at bedtime which provided minimal relief.  She is also been taking meloxicam 7.5 mg daily which has not alleviated her symptoms.  She has been going to physical therapy as advised which provides temporary relief but has not been getting to the root of her discomfort. Due to the chronicity of her pain, radicular symptoms, no response to NSAID use, and no progression in physical therapy a MRI of the thoracic spine will be ordered today for further evaluation.  DDD (degenerative disc disease), lumbar - She is on muscle relaxers prescribed by Dr. Sarajane Jews.  Fibromyalgia: She continues to experience intermittent myalgias and muscle tenderness due to fibromyalgia.  She has been experiencing trapezius muscle tension and tenderness bilaterally as well as intercostal tension and tenderness.  She has been taking muscle relaxers prescribed by Dr. Sarajane Jews and meloxicam 7.5 mg daily.  She has also been going to integrative therapies as advised.  Primary insomnia - She continues to take Ambien 10 mg at bedtime for insomnia.  TMJ (temporomandibular joint syndrome):Asymptomatic currently.   Age-related osteoporosis without current pathological fracture - DEXA on 12/13/17: The BMD measured at Femur Total Left is 0.516 g/cm2 with a T-score of -3.9. DEXA updated on 10/22/20: The BMD measured at Femur Neck Left is 0.560 g/cm2 with a T-score of -3.4.  She has been on Prolia since 2019 prescribed by Dr. Chalmers Cater.  She remains on a calcium vitamin D supplement daily.  Other medical conditions are listed as follows:   Interstitial cystitis  History of gastroesophageal reflux (GERD)  History of IBS  Orders: No orders of the defined types were placed in this  encounter.  No  orders of the defined types were placed in this encounter.     Follow-Up Instructions: Return in about 6 months (around 09/26/2022) for Fibromyalgia.   Ofilia Neas, PA-C  Note - This record has been created using Dragon software.  Chart creation errors have been sought, but may not always  have been located. Such creation errors do not reflect on  the standard of medical care.

## 2022-03-26 ENCOUNTER — Ambulatory Visit: Payer: 59 | Admitting: Physician Assistant

## 2022-03-26 ENCOUNTER — Encounter: Payer: Self-pay | Admitting: Physician Assistant

## 2022-03-26 VITALS — BP 104/65 | HR 92 | Ht 65.0 in | Wt 112.0 lb

## 2022-03-26 DIAGNOSIS — M79641 Pain in right hand: Secondary | ICD-10-CM

## 2022-03-26 DIAGNOSIS — M19041 Primary osteoarthritis, right hand: Secondary | ICD-10-CM | POA: Diagnosis not present

## 2022-03-26 DIAGNOSIS — M797 Fibromyalgia: Secondary | ICD-10-CM

## 2022-03-26 DIAGNOSIS — M546 Pain in thoracic spine: Secondary | ICD-10-CM

## 2022-03-26 DIAGNOSIS — M26609 Unspecified temporomandibular joint disorder, unspecified side: Secondary | ICD-10-CM

## 2022-03-26 DIAGNOSIS — M5030 Other cervical disc degeneration, unspecified cervical region: Secondary | ICD-10-CM

## 2022-03-26 DIAGNOSIS — M810 Age-related osteoporosis without current pathological fracture: Secondary | ICD-10-CM

## 2022-03-26 DIAGNOSIS — M62838 Other muscle spasm: Secondary | ICD-10-CM

## 2022-03-26 DIAGNOSIS — N3010 Interstitial cystitis (chronic) without hematuria: Secondary | ICD-10-CM

## 2022-03-26 DIAGNOSIS — F5101 Primary insomnia: Secondary | ICD-10-CM

## 2022-03-26 DIAGNOSIS — M19042 Primary osteoarthritis, left hand: Secondary | ICD-10-CM

## 2022-03-26 DIAGNOSIS — Z8719 Personal history of other diseases of the digestive system: Secondary | ICD-10-CM

## 2022-03-26 DIAGNOSIS — M51369 Other intervertebral disc degeneration, lumbar region without mention of lumbar back pain or lower extremity pain: Secondary | ICD-10-CM

## 2022-03-26 DIAGNOSIS — M5136 Other intervertebral disc degeneration, lumbar region: Secondary | ICD-10-CM

## 2022-03-26 NOTE — Addendum Note (Signed)
Addended by: Francis Gaines C on: 03/26/2022 12:10 PM   Modules accepted: Orders

## 2022-03-28 ENCOUNTER — Other Ambulatory Visit: Payer: Self-pay | Admitting: Family Medicine

## 2022-03-28 ENCOUNTER — Telehealth: Payer: Self-pay | Admitting: Rheumatology

## 2022-03-28 NOTE — Telephone Encounter (Signed)
Last refill- 08/09/21-30 tabs, 5 refills Last OV-02/23/22  No future OV scheduled.

## 2022-03-28 NOTE — Telephone Encounter (Signed)
I called patient, no one has had a chance to see if auth needed.

## 2022-03-28 NOTE — Telephone Encounter (Signed)
Patient called the office requesting an update on her MRI that was ordered by Lovena Le.

## 2022-04-05 ENCOUNTER — Encounter (HOSPITAL_COMMUNITY): Payer: Self-pay

## 2022-04-05 ENCOUNTER — Ambulatory Visit (HOSPITAL_COMMUNITY)
Admission: RE | Admit: 2022-04-05 | Discharge: 2022-04-05 | Disposition: A | Discharge status: Home / Self Care | Payer: 59 | Source: Ambulatory Visit | Attending: Physician Assistant | Admitting: Physician Assistant

## 2022-04-05 DIAGNOSIS — M546 Pain in thoracic spine: Secondary | ICD-10-CM

## 2022-04-06 ENCOUNTER — Other Ambulatory Visit: Payer: Self-pay | Admitting: Rheumatology

## 2022-04-06 MED ORDER — DIAZEPAM 5 MG PO TABS: ORAL_TABLET | ORAL | 0 refills | Status: DC

## 2022-04-06 NOTE — Telephone Encounter (Signed)
Sent a prescription for Valium 5 mg 1 tablet p.o. prior to the MRI should.  #1 tablet

## 2022-04-06 NOTE — Telephone Encounter (Signed)
Patient left a voicemail requesting medication for anxiety because she wasn't able to have her MRI done yesterday because of it. Patient states they might be able to get her in this week but she needs a medication to calm her down.

## 2022-04-06 NOTE — Telephone Encounter (Signed)
Please review prescription and send to the pharmacy. Thanks!

## 2022-04-06 NOTE — Telephone Encounter (Signed)
Attempted to contact the patient and left message on machine to advise patient that a prescription for valium has been sent to the pharmacy for her to take prior to the MRI.

## 2022-04-09 ENCOUNTER — Telehealth: Payer: Self-pay | Admitting: Rheumatology

## 2022-04-09 NOTE — Telephone Encounter (Signed)
Patient called the office back and states she would like to have her MRI at [place]. [phone].

## 2022-04-09 NOTE — Telephone Encounter (Signed)
Patient called the office stating she picked up the valium for her MRI. Patient states she would like orders for the MRI sent to [facility] as they have a bigger MRI machine.

## 2022-04-09 NOTE — Telephone Encounter (Signed)
Patient advised we are unable to order her MRI at [facility] due to it being a conflict of interest. Patient asked if we can send the order some where else. She states "I am not going to be able to go back to [facility], it was awful." Patient advised to contact her insurance to see what places would be approved and to call the office to let us know. Patient advised we may have to get a new authorization due to changing locations. Patient will advise [place] when she knows where she would like to have the MRI done.

## 2022-04-13 NOTE — Progress Notes (Signed)
Office Visit Note  Patient: Emily Ward             Date of Birth: 1956/02/03           MRN: [id number]             PCP: Laurey Morale, MD Referring: Laurey Morale, MD Visit Date: 04/26/2022 Occupation: '@[email or web address]' @  Subjective:  Discuss MRI results   History of Present Illness: Emily Ward is a 66 y.o. female with history of osteoarthritis, fibromyalgia, and DDD.  Patient presents today to discuss thoracic MRI results from 04/19/2022.  Patient reports that she continues to have intermittent discomfort in the thoracic spine.  She states that she continues to have diaphragmatic muscle spasms and midline spinal tenderness in the thoracic region.  She states that she is having episodic pain throughout the day.  She has been taking Parafon as needed which has been alleviating her muscle spasms.  She has also been going to physical therapy and has tried office stem, ultrasound, massage, and infrared treatments.  She states that she has 4 more physical therapy sessions. Patient reports that in the past she was a patient of Dr. Christella Noa at [facility] neurosurgery but has not followed up recently.   Activities of Daily Living:  Patient reports morning stiffness for 5-10 minutes.   Patient Denies nocturnal pain.  Difficulty dressing/grooming: Reports Difficulty climbing stairs: Reports Difficulty getting out of chair: Denies Difficulty using hands for taps, buttons, cutlery, and/or writing: Reports  Review of Systems  Constitutional:  Positive for fatigue.  HENT:  Positive for mouth dryness. Negative for mouth sores and nose dryness.   Eyes:  Positive for dryness. Negative for pain and visual disturbance.  Respiratory:  Negative for cough, hemoptysis and difficulty breathing.   Cardiovascular:  Negative for chest pain, palpitations, hypertension and swelling in legs/feet.  Gastrointestinal:  Negative for blood in stool, constipation and diarrhea.  Endocrine: Negative for  increased urination.  Genitourinary:  Negative for painful urination.  Musculoskeletal:  Positive for joint pain, gait problem, joint pain, myalgias, muscle weakness, morning stiffness, muscle tenderness and myalgias. Negative for joint swelling.  Skin:  Positive for sensitivity to sunlight. Negative for color change, pallor, rash, hair loss, nodules/bumps, skin tightness and ulcers.  Neurological:  Positive for dizziness and headaches. Negative for numbness and weakness.  Hematological:  Negative for swollen glands.  Psychiatric/Behavioral:  Positive for sleep disturbance. Negative for depressed mood. The patient is not nervous/anxious.     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   Palpitations 07/30/2017   Shortness of breath 07/30/2017   Hormone replacement therapy (HRT) 03/30/2017   Former smoker 03/30/2017   Other fatigue 06/30/2016   Insomnia 06/30/2016   DJD (degenerative joint disease), cervical 06/30/2016   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/2016   IBS (irritable bowel syndrome) 06/30/2016   Interstitial cystitis 06/30/2016   SHOULDER PAIN, RIGHT 09/05/2010   GERD 03/24/2009   SACROILIAC STRAIN 03/24/2009   Atypical chest pain 01/20/2008   HEMATURIA UNSPECIFIED 10/14/2007   FIBROMYALGIA 07/03/2007   HYPERLIPIDEMIA NEC/NOS 05/16/2007   HEADACHE 05/09/2007    Past Medical History:  Diagnosis Date   Cervical disc disease 2006   COPD (chronic obstructive pulmonary disease) ([place])  Emphysema lung ([place])    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     Fibromyalgia    sees Dr. 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 brother   Arthritis Son        psoriatic   Cancer Other        breast/fhx   Heart disease Other        fhx   Colon cancer Neg Hx    Esophageal cancer Neg Hx    Rectal cancer Neg Hx    Stomach cancer Neg Hx    Past Surgical History:  Procedure Laterality Date   CERVICAL LAMINECTOMY  2006   [place]   CHOLECYSTECTOMY  06/2002   CYSTOSCOPY N/A 09/01/2018   Procedure: CYSTOSCOPY;  Surgeon: Megan Salon, MD;  Location: [facility];  Service: Gynecology;  Laterality: N/A;   DILATATION & CURETTAGE/HYSTEROSCOPY WITH MYOSURE N/A 04/12/2017   Procedure: DILATATION & CURETTAGE/HYSTEROSCOPY;  Surgeon: Megan Salon, MD;  Location[facility];  Service: Gynecology;  Laterality: N/A;   facet joint block  03/30/2021   per patient, performed by Dr. Ernestina Patches   facet joint block  01/31/2022   per patient, performed by Dr. Ernestina Patches   HYSTEROSCOPY     years ago with Dr Radene Knee-? [place] SALPINGECTOMY Bilateral 09/01/2018   Procedure: TOTAL LAPAROSCOPIC HYSTERECTOMY WITH SALPINGECTOMY  BSO, REPAIR OF PERINEAL LACERATION;  Surgeon: Megan Salon, MD;  Location[facility];  Service: Gynecology;  Laterality: Bilateral;  possible BSO   URETHRAL DILATION     Social History   Social History Narrative   Not on file   Immunization History  Administered Date(s) Administered   Influenza Whole 06/30/2007, 05/04/2010   Influenza,inj,Quad PF,6+ Mos 07/12/2017, 05/23/2018, 09/08/2021   Influenza-Unspecified 06/02/2014, 06/03/2016, 06/20/2020   MMR 06/30/2007   Moderna  Sars-Covid-2 Vaccination 09/07/2019, 10/12/2019, 07/13/2020, 02/21/2021   Pneumococcal Polysaccharide-23 03/23/2019   Td 09/03/1998, 05/05/2009   Tdap 10/22/2018     Objective: Vital Signs: BP 109/74 (BP Location: Left Arm, Patient Position: Sitting, Cuff Size: Normal)   Pulse 71   Resp 15   Ht '5\' 5"'  (1.651 m)   Wt 112 lb 6.4 oz (51 kg)   LMP 09/03/2000 (Approximate)   BMI 18.70 kg/m    Physical Exam Vitals and nursing note reviewed.  Constitutional:      Appearance: She is well-developed.  HENT:     Head: Normocephalic and atraumatic.  Eyes:     Conjunctiva/sclera: Conjunctivae normal.  Cardiovascular:     Rate and Rhythm: Normal rate and regular rhythm.     Heart sounds: Normal heart sounds.  Pulmonary:     Effort: Pulmonary effort is normal.     Breath sounds: Normal breath sounds.  Abdominal:     General: Bowel sounds are normal.     Palpations: Abdomen  is soft.  Musculoskeletal:     Cervical back: Normal range of motion.  Skin:    General: Skin is warm and dry.     Capillary Refill: Capillary refill takes less than 2 seconds.  Neurological:     Mental Status: She is alert and oriented to person, place, and time.  Psychiatric:        Behavior: Behavior normal.      Musculoskeletal Exam: Generalized hyperalgesia and positive tender points.  C-spine has limited range of motion.  Trapezius muscle tension tenderness bilaterally.  Midline spinal tenderness in the thoracic region.  Shoulder joints have good range of motion with no discomfort.  Elbow joints have good range of motion.  Wrist joints, MCPs, PIPs, and DIPs have good ROM with no synovitis.  PIP and DIP thickening.  CMC joint prominence. Hip joints have good ROM.  Knee joints have good ROM with no discomfort.  Ankle joints have good ROM with no tenderness or joint swelling.    CDAI Exam: CDAI Score: -- Patient Global: --; Provider Global: -- Swollen: --; Tender: -- Joint Exam 04/26/2022   No joint exam  has been documented for this visit   There is currently no information documented on the homunculus. Go to the Rheumatology activity and complete the homunculus joint exam.  Investigation: No additional findings.  Imaging: CT Chest Wo Contrast  Result Date: 04/18/2022 CLINICAL DATA:  Lung nodule.  Thoracic and back pain. EXAM: CT CHEST WITHOUT CONTRAST TECHNIQUE: Multidetector CT imaging of the chest was performed following the standard protocol without IV contrast. RADIATION DOSE REDUCTION: This exam was performed according to the departmental dose-optimization program which includes automated exposure control, adjustment of the mA and/or kV according to patient size and/or use of iterative reconstruction technique. COMPARISON:  Chest x-ray 02/23/2022. Chest CT 06/13/2021. chest CT 04/27/2019. FINDINGS: Cardiovascular: No significant vascular findings. Normal heart size. No pericardial effusion. Mediastinum/Nodes: No enlarged mediastinal or axillary lymph nodes. Thyroid gland, trachea, and esophagus demonstrate no significant findings. Lungs/Pleura: Mild emphysematous changes are again seen. There is stable linear areas of scarring in the bilateral lower lobes. There is also stable scarring in the apices. 2 mm nodular area in the right lung apex image 3/33 is unchanged from 2020 and favored as benign. There is no pleural effusion or pneumothorax. There is a new small patchy area of airspace opacity in the medial right upper lobe which may be infectious/inflammatory. This has a slightly nodular component measuring up to 7 mm image 3/20. Upper Abdomen: Cholecystectomy clips are present. No acute findings. Subcentimeter hypodensity in the dome of the liver is too small to characterize, likely a cyst. This is unchanged. Musculoskeletal: No chest wall mass or suspicious bone lesions identified. IMPRESSION: 1. Small new patchy area of airspace opacity in the right upper lobe may be infectious/inflammatory. This  has a slightly nodular configuration measuring up to 7 mm. Non-contrast chest CT at 6-12 months is recommended. If the nodule is stable at time of repeat CT, then future CT at 18-24 months (from today's scan) is considered optional for low-risk patients, but is recommended for high-risk patients. This recommendation follows the consensus statement: Guidelines for Management of Incidental Pulmonary Nodules Detected on CT Images: From the Fleischner Society 2017; Radiology 2017; 284:228-243. 2.  Emphysema (ICD10-J43.9). Electronically Signed   By: Ronney Asters M.D.   On: 04/18/2022 22:26    Recent Labs: Lab Results  Component Value Date   WBC 5.0 01/01/2022   HGB 14.0 01/01/2022  PLT 204 01/01/2022   NA 138 01/01/2022   K 4.5 01/01/2022   CL 103 01/01/2022   CO2 27 01/01/2022   GLUCOSE 95 01/01/2022   BUN 17 01/01/2022   CREATININE 0.65 01/01/2022   BILITOT 0.4 01/01/2022   ALKPHOS 66 09/08/2021   AST 21 01/01/2022   ALT 9 01/01/2022   PROT 7.0 01/01/2022   PROT 6.9 01/01/2022   ALBUMIN 4.4 09/08/2021   CALCIUM 10.0 01/01/2022   GFRAA >60 10/22/2018    Speciality Comments: Prolia started 2019.  Last injection July 31, 2021.  Patient wants to switch Prolia injections to our office.  Procedures:  No procedures performed Allergies: Divalproex sodium and Nsaids   Assessment / Plan:     Visit Diagnoses: Primary osteoarthritis of both hands - X-rays of both hands were obtained on 02/09/2020 which were consistent with osteoarthritic changes.  No erosive changes noted.  She has PIP and DIP thickening consistent with osteoarthritis of both hands.  CMC joint prominence noted bilaterally.  Complete fist formation noted bilaterally.  Discussed the importance of joint protection and muscle strengthening.  No synovitis was noted on examination today.  Trapezius muscle spasm: She has ongoing trapezius muscle tension and tenderness bilaterally.  She has been taking Parafon 4 times daily as  needed for muscle spasms.  She has found Parafon to be more effective than Flexeril recently.  DDD (degenerative disc disease), cervical - MRI performed on 05/03/20.  She establish care with Dr. Ernestina Patches in September 2021.  She had facet joint injections on 06/02/2020, 03/30/2021, and 01/31/2022.  Pain in thoracic spine: Patient presents today to discuss thoracic MRI results.  MRI of thoracic spine performed on 04/19/22: No herniated disc, compression fracture, or spinal cord abnormality.  Large nerve root cyst on the right at T8-9. Additional smaller nerve root cysts on the right from T6 through T11.  She has been taking Parafon 1 tablet 4 times daily as needed for muscle spasms and meloxicam 7.5 mg daily for pain relief.  Her symptoms have been less frequent since her last office visit.  She continues to experience intermittent diaphragmatic muscle spasms and has midline spinal tenderness in the thoracic region.  A referral to [place] neurosurgery will be placed today for further evaluation and management.  She can continue her last 4 sessions of physical therapy in the meantime.   DDD (degenerative disc disease), lumbar -She has no midline spinal tenderness in the lumbar region at this time.  No symptoms of radiculopathy currently.  Fibromyalgia: She has generalized hyperalgesia and positive tender points on exam.  She is experiencing trapezius muscle tension and tenderness bilaterally.  She has been taking Parafon 1 tablet 4 times daily as needed for muscle spasms.  She has found paraffin to be more effective than Flexeril recently.  She is also been taking meloxicam 7.5 mg 1 tablet daily for pain relief.  She remains on Ambien 10 mg at bedtime for insomnia.  She remains active exercising on a daily basis.  Primary insomnia - She takes Ambien 10 mg at bedtime for insomnia.  TMJ (temporomandibular joint syndrome): Asymptomatic currently.   Age-related osteoporosis without current pathological fracture -  DEXA updated on 10/22/20: The BMD measured at Femur Neck Left is 0.560 g/cm2 with a T-score of -3.4. She has been on Prolia since 2019 prescribed by Dr. Chalmers Cater.  Other medical conditions are listed as follows:   Interstitial cystitis  History of gastroesophageal reflux (GERD)  History of IBS  Orders: No  orders of the defined types were placed in this encounter.  No orders of the defined types were placed in this encounter.   Follow-Up Instructions: Return in about 6 months (around 10/27/2022) for Osteoarthritis, Fibromyalgia, DDD.   Ofilia Neas, PA-C  Note - This record has been created using Dragon software.  Chart creation errors have been sought, but may not always  have been located. Such creation errors do not reflect on  the standard of medical care.

## 2022-04-17 ENCOUNTER — Ambulatory Visit
Admission: RE | Admit: 2022-04-17 | Discharge: 2022-04-17 | Disposition: A | Discharge status: Home / Self Care | Payer: 59 | Source: Ambulatory Visit | Attending: Pulmonary Disease | Admitting: Pulmonary Disease

## 2022-04-17 DIAGNOSIS — R0602 Shortness of breath: Secondary | ICD-10-CM

## 2022-04-26 ENCOUNTER — Ambulatory Visit: Payer: 59 | Attending: Physician Assistant | Admitting: Physician Assistant

## 2022-04-26 ENCOUNTER — Encounter: Payer: Self-pay | Admitting: Physician Assistant

## 2022-04-26 VITALS — BP 109/74 | HR 71 | Resp 15 | Ht 65.0 in | Wt 112.4 lb

## 2022-04-26 DIAGNOSIS — M5030 Other cervical disc degeneration, unspecified cervical region: Secondary | ICD-10-CM

## 2022-04-26 DIAGNOSIS — F5101 Primary insomnia: Secondary | ICD-10-CM

## 2022-04-26 DIAGNOSIS — M810 Age-related osteoporosis without current pathological fracture: Secondary | ICD-10-CM

## 2022-04-26 DIAGNOSIS — M797 Fibromyalgia: Secondary | ICD-10-CM

## 2022-04-26 DIAGNOSIS — Z8719 Personal history of other diseases of the digestive system: Secondary | ICD-10-CM

## 2022-04-26 DIAGNOSIS — M546 Pain in thoracic spine: Secondary | ICD-10-CM | POA: Diagnosis not present

## 2022-04-26 DIAGNOSIS — M62838 Other muscle spasm: Secondary | ICD-10-CM | POA: Diagnosis not present

## 2022-04-26 DIAGNOSIS — M19042 Primary osteoarthritis, left hand: Secondary | ICD-10-CM

## 2022-04-26 DIAGNOSIS — M19041 Primary osteoarthritis, right hand: Secondary | ICD-10-CM

## 2022-04-26 DIAGNOSIS — M26609 Unspecified temporomandibular joint disorder, unspecified side: Secondary | ICD-10-CM

## 2022-04-26 DIAGNOSIS — M5136 Other intervertebral disc degeneration, lumbar region: Secondary | ICD-10-CM

## 2022-04-26 DIAGNOSIS — N3010 Interstitial cystitis (chronic) without hematuria: Secondary | ICD-10-CM

## 2022-04-27 ENCOUNTER — Telehealth: Payer: Self-pay | Admitting: Pulmonary Disease

## 2022-04-27 DIAGNOSIS — J449 Chronic obstructive pulmonary disease, unspecified: Secondary | ICD-10-CM

## 2022-04-27 NOTE — Addendum Note (Signed)
Addended by: Earnestine Mealing on: 04/27/2022 08:27 AM   Modules accepted: Orders

## 2022-05-01 NOTE — Telephone Encounter (Signed)
Patient is requesting CT results from Dr. Vaughan Browner. CT chest was 04/17/22.   Message routed to Dr. Vaughan Browner to advise

## 2022-05-02 ENCOUNTER — Encounter: Payer: Self-pay | Admitting: Pulmonary Disease

## 2022-05-02 NOTE — Telephone Encounter (Signed)
CT shows a small area of opacity in the upper part of the right lung measuring about 7 mm.  Please order a follow-up CT without contrast in 6 months In addition the findings of emphysema.  There are no other worrisome findings.

## 2022-05-03 NOTE — Telephone Encounter (Signed)
Called and spoke with Patient.  Dr. Matilde Bash results and recommendations given. Understanding stated. CT chest  order placed.  Nothing further at this time.

## 2022-05-03 NOTE — Telephone Encounter (Signed)
See telephone encounter 04/27/22 for results.

## 2022-05-11 ENCOUNTER — Other Ambulatory Visit: Payer: Self-pay | Admitting: Obstetrics & Gynecology

## 2022-05-11 ENCOUNTER — Ambulatory Visit
Admission: RE | Admit: 2022-05-11 | Discharge: 2022-05-11 | Disposition: A | Discharge status: Home / Self Care | Payer: 59 | Source: Ambulatory Visit | Attending: Obstetrics & Gynecology | Admitting: Obstetrics & Gynecology

## 2022-05-11 DIAGNOSIS — R921 Mammographic calcification found on diagnostic imaging of breast: Secondary | ICD-10-CM

## 2022-05-15 NOTE — Telephone Encounter (Signed)
I called the patient to discuss her recent office visit on 05/14/22 with Dr. Christella Noa.  I recommended following up with Dr. Ernestina Patches at [facility] for further evaluation and management. She plans on calling to schedule an appt.

## 2022-05-17 ENCOUNTER — Encounter: Payer: Self-pay | Admitting: Rheumatology

## 2022-05-22 ENCOUNTER — Encounter: Payer: Self-pay | Admitting: Physical Medicine and Rehabilitation

## 2022-05-22 ENCOUNTER — Ambulatory Visit: Payer: 59 | Admitting: Physical Medicine and Rehabilitation

## 2022-05-22 VITALS — BP 114/72 | HR 69

## 2022-05-22 DIAGNOSIS — R0782 Intercostal pain: Secondary | ICD-10-CM | POA: Diagnosis not present

## 2022-05-22 DIAGNOSIS — R0781 Pleurodynia: Secondary | ICD-10-CM | POA: Diagnosis not present

## 2022-05-22 DIAGNOSIS — M546 Pain in thoracic spine: Secondary | ICD-10-CM | POA: Diagnosis not present

## 2022-05-22 DIAGNOSIS — M7918 Myalgia, other site: Secondary | ICD-10-CM

## 2022-05-22 DIAGNOSIS — M797 Fibromyalgia: Secondary | ICD-10-CM

## 2022-05-22 DIAGNOSIS — G8929 Other chronic pain: Secondary | ICD-10-CM

## 2022-05-22 MED ORDER — DIAZEPAM 5 MG PO TABS: ORAL_TABLET | ORAL | 0 refills | Status: DC

## 2022-05-22 NOTE — Progress Notes (Unsigned)
Pain for 4-5 months. Pain in mid back. PT has caused more muscle spasms in mid back but has helped some around neck. No injs done yet. Taking meloxicam(been taking for years) and a muscle relaxor( not helping much)  Numeric Pain Rating Scale and Functional Assessment Average Pain 3   In the last MONTH (on 0-10 scale) has pain interfered with the following?  1. General activity like being  able to carry out your everyday physical activities such as walking, climbing stairs, carrying groceries, or moving a chair?  Rating(8)-this is with muscle spasms

## 2022-05-22 NOTE — Progress Notes (Unsigned)
Emily Ward - 66 y.o. female MRN [id number]  Date of birth: 01-Dec-1955  Office Visit Note: Visit Date: 05/22/2022 PCP: Laurey Morale, MD Referred by: Laurey Morale, MD  Subjective: Chief Complaint  Patient presents with   Middle Back - Pain   HPI: Emily Ward is a 66 y.o. female who comes in today for evaluation of chronic, worsening and severe midline thoracic back pain radiating around to bilateral ribs and chest, right greater than left, ongoing for 4-5 months. States pain can occur anytime, even at rest, however pain does increase with movement and activity. Reports pain makes it difficult to breath at times. She describes pain as cramping, spasms and stabbing in nature, currently rates as 7 out of 10. Patient states pain makes her feel like she is wearing a corset. Some relief of pain with home exercise regimen, rest and use of medications. Currently taking Meloxicam, Flexeril and Parafon. Patient has attended formal physical therapy in the past at [place], this was more for muscle weakness and coordination issues. More recently she has attended [facility] with a focus on fibromyalgia and thoracic back pain, some temporary relief of pain with these treatments. 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 No high grade spinal canal stenosis noted. Patient does have history of fibromyalgia, however states current pain is much worse than typical flare ups. Patient was recently evaluated by Dr. Marshell Garfinkel with pulmonology, CT of chest was negative for any abnormal findings, per his notes could be secondary to fibromyalgia or costochondritis. Patient did have neurosurgical with Dr. Ashok Pall at [facility] recently. I am unable to see consult notes, patient states Dr. Christella Noa did not recommend surgical intervention at this  time. Patient continues to be managed by Dr. Bo Merino with [facility]. Patient states she struggles with chronic pain daily, also reports chronic issues with neck and lower back pain. Patient underwent cervical facet joint injections in our office this past May with good relief of pain.  Patient denies focal weakness, numbness and tingling.  Patient denies recent trauma or falls.   Review of Systems  Musculoskeletal:  Positive for back pain and myalgias.  Neurological:  Negative for tingling, sensory change, focal weakness and weakness.  All other systems reviewed and are negative.  Otherwise per HPI.  Assessment & Plan: Visit Diagnoses:    ICD-10-CM   1. Chronic midline thoracic back pain  M54.6 Ambulatory referral to Physical Medicine Rehab   G89.29     2. Rib pain  R07.81     3. Intercostal pain  R07.82     4. Myofascial pain  M79.18 Ambulatory referral to Physical Medicine Rehab    5. Fibromyalgia  M79.7 Ambulatory referral to Physical Medicine Rehab       Plan: Findings:  Chronic, worsening and severe midline thoracic back pain radiating around to bilateral ribs and chest, right greater than left. Patient continues to have severe pain despite good conservative therapies such as formal physical therapy, home exercise regimen, rest and use of medications. Patients clinical presentation and exam are complex, we do feel her fibromyalgia is significantly contributing to her symptoms. She has failed conservative therapies and remains in severe pain. Next step is to perform diagnostic and hopefully therapeutic right T9-T10 interlaminar epidural steroid injection under fluoroscopic guidance. She is not currently taking anticoagulant medications. Patient did  voice concerns about anxiety related to injection, I did place prescription for pre-procedure Valium to take day of injection. If her pain persists post injection we would consider re-grouping with physical therapy, we also  discussed possible medication management, could try Cymbalta. No red flag symptoms noted upon exam today.     Meds & Orders:  Meds ordered this encounter  Medications   diazepam (VALIUM) 5 MG tablet    Sig: Take one tablet by mouth with food one hour prior to procedure. May repeat 30 minutes prior if needed.    Dispense:  2 tablet    Refill:  0    Orders Placed This Encounter  Procedures   Ambulatory referral to Physical Medicine Rehab    Follow-up: Return for Right T9-T10 interlaminar epidural steroid injection.   Procedures: No procedures performed      Clinical History: No specialty comments available.   She reports that she quit smoking about 9 years ago. Her smoking use included cigarettes. She has a 20.00 pack-year smoking history. She has never been exposed to tobacco smoke. She has never used smokeless tobacco.  Recent Labs    09/08/21 1405  HGBA1C 5.5    Objective:  VS:  HT:    WT:   BMI:     BP:114/72  HR:69bpm  TEMP: ( )  RESP:  Physical Exam Vitals and nursing note reviewed.  HENT:     Head: Normocephalic and atraumatic.     Right Ear: External ear normal.     Left Ear: External ear normal.     Nose: Nose normal.     Mouth/Throat:     Mouth: Mucous membranes are moist.  Eyes:     Extraocular Movements: Extraocular movements intact.  Cardiovascular:     Rate and Rhythm: Normal rate.     Pulses: Normal pulses.  Pulmonary:     Effort: Pulmonary effort is normal.  Abdominal:     General: Abdomen is flat. There is no distension.  Musculoskeletal:        General: Tenderness present.     Cervical back: Normal range of motion.     Comments: Pt rises from seated position to standing without difficulty. Strong distal strength without clonus, no pain upon palpation of greater trochanters. Sensation intact bilaterally. Tenderness noted upon palpation of midline thoracic back and bilateral intercostal regions.  Walks independently, gait steady.   Skin:     General: Skin is warm and dry.     Capillary Refill: Capillary refill takes less than 2 seconds.  Neurological:     General: No focal deficit present.     Mental Status: She is alert and oriented to person, place, and time.  Psychiatric:        Mood and Affect: Mood normal.        Behavior: Behavior normal.     Ortho Exam  Imaging: No results found.  Past Medical/Family/Surgical/Social History: Medications & Allergies reviewed per EMR, new medications updated. 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   Palpitations 07/30/2017   Shortness of breath 07/30/2017   Hormone replacement therapy (HRT) 03/30/2017   Former smoker 03/30/2017   Other fatigue 06/30/2016   Insomnia 06/30/2016   DJD (degenerative joint disease), cervical 06/30/2016   Spondylosis of lumbar region without myelopathy  or radiculopathy 06/30/2016   IBS (irritable bowel syndrome) 06/30/2016   Interstitial cystitis 06/30/2016   SHOULDER PAIN, RIGHT 09/05/2010   GERD 03/24/2009   SACROILIAC STRAIN 03/24/2009   Atypical chest pain 01/20/2008   HEMATURIA UNSPECIFIED 10/14/2007   FIBROMYALGIA 07/03/2007   HYPERLIPIDEMIA NEC/NOS 05/16/2007   HEADACHE 05/09/2007   Past Medical History:  Diagnosis Date   Cervical disc disease 2006   COPD (chronic obstructive pulmonary disease) ([place])    Emphysema lung ([place])    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     Fibromyalgia    sees Dr. 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 brother   Arthritis Son        psoriatic   Cancer Other        breast/fhx   Heart disease Other        fhx   Colon cancer Neg Hx    Esophageal cancer Neg Hx    Rectal cancer Neg Hx    Stomach cancer Neg Hx    Past Surgical History:  Procedure Laterality Date   CERVICAL LAMINECTOMY  2006   [place]   CHOLECYSTECTOMY  06/2002   CYSTOSCOPY N/A 09/01/2018   Procedure: CYSTOSCOPY;  Surgeon: Kymberlyn Eckford Salon, MD;  Location: [facility];  Service: Gynecology;  Laterality: N/A;   DILATATION & CURETTAGE/HYSTEROSCOPY WITH MYOSURE N/A 04/12/2017   Procedure: DILATATION & CURETTAGE/HYSTEROSCOPY;  Surgeon: Vaughn Frieze Salon, MD;  Location: [facility];  Service: Gynecology;  Laterality: N/A;   facet joint block  03/30/2021   per patient, performed by Dr. Ernestina Patches   facet joint block  01/31/2022   per patient, performed by Dr. Ernestina Patches   HYSTEROSCOPY     years ago with Dr Radene Knee-? [place] SALPINGECTOMY Bilateral 09/01/2018   Procedure: TOTAL LAPAROSCOPIC HYSTERECTOMY WITH SALPINGECTOMY  BSO, REPAIR OF PERINEAL LACERATION;  Surgeon: Katharina Jehle Salon, MD;  Location: [facility];  Service: Gynecology;  Laterality: Bilateral;  possible BSO   URETHRAL DILATION     Social History   Occupational History   Not on file  Tobacco Use   Smoking status: Former    Packs/day: 1.00    Years: 20.00    Total pack years: 20.00    Types: Cigarettes    Quit date: 04/02/2013    Years since quitting: 9.1    Passive exposure: Never   Smokeless tobacco: Never  Vaping Use   Vaping Use: Never used  Substance and Sexual Activity   Alcohol use: Yes    Alcohol/week: 1.0 standard drink of alcohol    Types: 1 Glasses of wine per week    Comment: occ   Drug use: Never   Sexual  activity: Never    Partners: Male

## 2022-05-27 ENCOUNTER — Other Ambulatory Visit: Payer: Self-pay | Admitting: Family Medicine

## 2022-05-29 NOTE — Telephone Encounter (Signed)
Last OV- 02/23/22- with Dr. Volanda Napoleon Last refill-12/06/21--120 tabs, 5 refills  No future OV scheduled.

## 2022-06-05 ENCOUNTER — Encounter: Payer: Self-pay | Admitting: Pulmonary Disease

## 2022-06-07 ENCOUNTER — Ambulatory Visit: Payer: Self-pay

## 2022-06-07 ENCOUNTER — Ambulatory Visit: Payer: 59 | Admitting: Physical Medicine and Rehabilitation

## 2022-06-07 VITALS — BP 122/84 | HR 88

## 2022-06-07 DIAGNOSIS — M5414 Radiculopathy, thoracic region: Secondary | ICD-10-CM

## 2022-06-07 HISTORY — PX: OTHER SURGICAL HISTORY: SHX169

## 2022-06-07 MED ORDER — METHYLPREDNISOLONE ACETATE 80 MG/ML IJ SUSP
40.0000 mg | Freq: Once | INTRAMUSCULAR | Status: AC
  Administered 2022-06-07: 40 mg

## 2022-06-07 NOTE — Progress Notes (Signed)
Numeric Pain Rating Scale and Functional Assessment Average Pain 3-4   In the last MONTH (on 0-10 scale) has pain interfered with the following?  1. General activity like being  able to carry out your everyday physical activities such as walking, climbing stairs, carrying groceries, or moving a chair?  Rating(8)   +Driver, -BT, -Dye Allergies.

## 2022-06-07 NOTE — Patient Instructions (Signed)

## 2022-06-12 MED ORDER — ALBUTEROL SULFATE HFA 108 (90 BASE) MCG/ACT IN AERS
1.0000 | INHALATION_SPRAY | Freq: Four times a day (QID) | RESPIRATORY_TRACT | 2 refills | Status: DC | PRN
Start: 2022-06-12 — End: 2023-07-03

## 2022-06-14 NOTE — Progress Notes (Signed)
Office Visit Note  Patient: Emily Ward             Date of Birth: 07-18-1956           MRN: [id number]             PCP: Laurey Morale, MD Referring: Laurey Morale, MD Visit Date: 06/28/2022 Occupation: _0 @  Subjective:  Thoracic pain  History of Present Illness: Rumaysa Sabatino is a 66 y.o. female with history of osteoarthritis, degenerative disc disease, osteoporosis and fibromyalgia syndrome.  She had been experiencing increased thoracic pain.  She had a good response to epidural injection T9-T10 given by Dr. Ernestina Patches on June 07, 2022.  She states she has been taking meloxicam 7.5 mg for several years.  He states about 2 weeks ago she started having abdominal discomfort, chills and stools.  The symptoms lasted for couple of days.  But her abdominal pain and discomfort are persist.  She decided to come off meloxicam and her symptoms resolved.  She had similar symptoms in the past when she had gastritis.  She continues to have neck and lower back discomfort.  There is no history of joint swelling.  Fibromyalgia symptoms are stable.  We did physical therapy for her degenerative disc disease.  She is also getting Prolia injections for osteoporosis.  Her next Prolia injection is scheduled in first week of December.  Activities of Daily Living:  Patient reports morning stiffness for several hours.   Patient Reports nocturnal pain.  Difficulty dressing/grooming: Denies Difficulty climbing stairs: Denies Difficulty getting out of chair: Denies Difficulty using hands for taps, buttons, cutlery, and/or writing: Reports  Review of Systems  Constitutional:  Positive for fatigue.  HENT:  Positive for mouth dryness. Negative for mouth sores.   Eyes:  Positive for dryness.  Respiratory:  Negative for difficulty breathing.   Cardiovascular:  Negative for chest pain and palpitations.  Gastrointestinal:  Negative for blood in stool, constipation and diarrhea.  Endocrine:  Negative for increased urination.  Genitourinary:  Negative for involuntary urination.  Musculoskeletal:  Positive for joint pain, joint pain, myalgias, morning stiffness, muscle tenderness and myalgias. Negative for gait problem, joint swelling and muscle weakness.  Skin:  Negative for color change, rash, hair loss and sensitivity to sunlight.  Allergic/Immunologic: Positive for susceptible to infections.  Neurological:  Positive for dizziness and headaches.  Hematological:  Negative for swollen glands.  Psychiatric/Behavioral:  Negative for depressed mood and sleep disturbance. The patient is not nervous/anxious.     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   Palpitations 07/30/2017   Shortness of breath 07/30/2017   Hormone replacement therapy (HRT) 03/30/2017   Former smoker 03/30/2017   Other fatigue 06/30/2016   Insomnia 06/30/2016   DJD (degenerative joint disease), cervical 06/30/2016   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/2016   IBS (irritable bowel syndrome) 06/30/2016   Interstitial cystitis 06/30/2016   SHOULDER PAIN, RIGHT 09/05/2010   GERD 03/24/2009   SACROILIAC STRAIN 03/24/2009   Atypical chest pain 01/20/2008   HEMATURIA UNSPECIFIED 10/14/2007   FIBROMYALGIA 07/03/2007   HYPERLIPIDEMIA NEC/NOS 05/16/2007   HEADACHE 05/09/2007    Past Medical History:  Diagnosis Date   Cervical disc disease 2006   COPD (chronic  obstructive pulmonary disease) (HCC)    Emphysema lung (HCC)    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     Fibromyalgia    sees Dr. 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 brother   Arthritis Son        psoriatic   Cancer Other        breast/fhx   Heart disease Other        fhx   Colon cancer Neg Hx    Esophageal cancer Neg Hx    Rectal cancer Neg Hx    Stomach cancer Neg Hx    Past Surgical History:  Procedure Laterality Date   CERVICAL LAMINECTOMY  2006   [place]   CHOLECYSTECTOMY  06/2002   CYSTOSCOPY N/A 09/01/2018   Procedure: CYSTOSCOPY;  Surgeon: Megan Salon, MD;  Location: [facility];  Service: Gynecology;  Laterality: N/A;   DILATATION & CURETTAGE/HYSTEROSCOPY WITH MYOSURE N/A 04/12/2017   Procedure: DILATATION & CURETTAGE/HYSTEROSCOPY;  Surgeon: Megan Salon, MD;  Location: [facility];  Service: Gynecology;  Laterality: N/A;   facet joint block  03/30/2021   per patient, performed by Dr. Ernestina Patches   facet joint block  01/31/2022   per patient, performed by Dr. Ernestina Patches   HYSTEROSCOPY     years ago with Dr Radene Knee-? [place] SALPINGECTOMY Bilateral 09/01/2018   Procedure: TOTAL LAPAROSCOPIC HYSTERECTOMY WITH SALPINGECTOMY  BSO, REPAIR OF PERINEAL LACERATION;  Surgeon: Megan Salon, MD;  Location: [facility];  Service: Gynecology;  Laterality: Bilateral;  possible BSO   URETHRAL DILATION     Social History   Social History Narrative   Not on file   Immunization History  Administered Date(s) Administered   Influenza Whole 06/30/2007, 05/04/2010   Influenza,inj,Quad PF,6+ Mos 07/12/2017, 05/23/2018, 09/08/2021   Influenza-Unspecified 06/02/2014, 06/03/2016, 06/20/2020   MMR 06/30/2007   Moderna Sars-Covid-2  Vaccination 09/07/2019, 10/12/2019, 07/13/2020, 02/21/2021   Pneumococcal Polysaccharide-23 03/23/2019   Td 09/03/1998, 05/05/2009   Tdap 10/22/2018     Objective: Vital Signs: BP 109/74 (BP Location: Left Arm, Patient Position: Sitting, Cuff Size: Normal)   Pulse 87   Resp 15   Ht _0  (1.651 m)   Wt 108 lb (49 kg)   LMP 09/03/2000 (Approximate)   BMI 17.97 kg/m    Physical Exam Vitals and nursing note reviewed.  Constitutional:      Appearance: She is well-developed.  HENT:     Head: Normocephalic and atraumatic.  Eyes:     Conjunctiva/sclera: Conjunctivae normal.  Cardiovascular:     Rate and Rhythm: Normal rate and regular rhythm.     Heart sounds: Normal heart sounds.  Pulmonary:     Effort: Pulmonary effort is normal.     Breath sounds: Normal breath sounds.  Abdominal:     General: Bowel sounds are normal.  Palpations: Abdomen is soft.  Musculoskeletal:     Cervical back: Normal range of motion.  Lymphadenopathy:     Cervical: No cervical adenopathy.  Skin:    General: Skin is warm and dry.     Capillary Refill: Capillary refill takes less than 2 seconds.  Neurological:     Mental Status: She is alert and oriented to person, place, and time.  Psychiatric:        Behavior: Behavior normal.      Musculoskeletal Exam: She had limited lateral rotation of the cervical spine.  She had tenderness over the trapezius areas.  She had no tenderness over thoracic or lumbar region today.  No SI joint tenderness was noted.  Shoulder joints, elbow joints, wrist joints, MCPs PIPs and DIPs been good range of motion.  She had bilateral PIP and DIP thickening.  She bilateral CMC thickening.  Hip joints were in good range of motion.  Knee joints were in good range of motion without any warmth swelling or effusion.  There was no tenderness over ankles or MTPs.  CDAI Exam: CDAI Score: -- Patient Global: --; Provider Global: -- Swollen: --; Tender: -- Joint Exam 06/28/2022    No joint exam has been documented for this visit   There is currently no information documented on the homunculus. Go to the Rheumatology activity and complete the homunculus joint exam.  Investigation: No additional findings.  Imaging: XR C-ARM NO REPORT  Result Date: 06/07/2022 Please see Notes tab for imaging impression.  Epidural Steroid injection  Result Date: 06/07/2022 Magnus Sinning, MD     06/18/2022  6:21 AM Thoracic epidural Steroid Injection - Interlaminar Approach with Fluoroscopic Guidance Patient: Saniya Tranchina     Date of Birth: 1956/08/07 MRN: [id number] PCP: Laurey Morale, MD     Visit Date: 06/07/2022  Universal Protocol:   Consent Given By: the patient Position: PRONE Additional Comments: Vital signs were monitored before and after the procedure. Patient was prepped and draped in the usual sterile fashion. The correct patient, procedure, and site was verified. Injection Procedure Details: Procedure diagnoses: 1. Thoracic radiculopathy   Meds Administered: Meds ordered this encounter Medications  methylPREDNISolone acetate (DEPO-MEDROL) injection 40 mg  Laterality: Right Location/Site:  T9-10 Needle: 3.5 in., 20 ga. Tuohy Needle Placement: Paramedian epidural Findings:  -Comments: Excellent flow of contrast into the epidural space. Procedure Details: The fluoroscope was aligned to square off the endplates at the level noted above.  The target area located is the inferior or caudal portion of the vertebral body which is essentially the superior lamina of the level indicated and the overlying structure was infiltrated with 2 to 3 mL of 1% lidocaine without epinephrine and a 22-gauge spinal needle was introduced down to the lamina until contact with bone.  This needle was utilized as a locating needle.  The fluoroscope was subsequently tilted in a caudal direction to achieve the most open interlaminar spacing noted.  Using a paramedian approach from the side mentioned  above, the region overlying the interlaminar space was localized under fluoroscopic visualization and the soft tissues overlying this structure were infiltrated with 4 ml. of 1% Lidocaine without Epinephrine. The Tuohy needle was inserted into the epidural space using a paramedian approach and biplanar fluoroscopy. The epidural space was localized using loss of resistance along with lateral or counter oblique and bi-planar fluoroscopic views.  After negative aspirate for air, blood, and CSF, a 2 ml. volume of Isovue-250 was injected into the epidural space  and the flow of contrast was observed. Radiographs were obtained for documentation purposes.  The injectate was administered into the level noted above. Additional Comments: The patient tolerated the procedure well Dressing: 2 x 2 sterile gauze and Band-Aid  Post-procedure details: Patient was observed during the procedure. Post-procedure instructions were reviewed. Patient left the clinic in stable condition.   Recent Labs: Lab Results  Component Value Date   WBC 5.0 01/01/2022   HGB 14.0 01/01/2022   PLT 204 01/01/2022   NA 138 01/01/2022   K 4.5 01/01/2022   CL 103 01/01/2022   CO2 27 01/01/2022   GLUCOSE 95 01/01/2022   BUN 17 01/01/2022   CREATININE 0.65 01/01/2022   BILITOT 0.4 01/01/2022   ALKPHOS 66 09/08/2021   AST 21 01/01/2022   ALT 9 01/01/2022   PROT 7.0 01/01/2022   PROT 6.9 01/01/2022   ALBUMIN 4.4 09/08/2021   CALCIUM 10.0 01/01/2022   GFRAA >60 10/22/2018    Speciality Comments: Prolia started 2019.  Last injection July 31, 2021.  Patient wants to switch Prolia injections to our office.  Procedures:  No procedures performed Allergies: Divalproex sodium and Nsaids   Assessment / Plan:     Visit Diagnoses: Primary osteoarthritis of both hands - X-rays of both hands were obtained on 02/09/2020 which were consistent with osteoarthritic changes.  No erosive changes noted.  Trapezius muscle spasm -she states that  trapezius pain is manageable with muscle relaxers.  She has been taking Flexeril at bedtime.  DDD (degenerative disc disease), cervical -she had limited lateral rotation of the cervical spine without much discomfort.  MRI performed on 05/03/20.  She establish care with Dr. Ernestina Patches in September 2021.  She had facet joint injections on 06/02/2020, 03/30/2021, and 01/31/2022.  Pain in thoracic spine -her thoracic spine pain that resolved after T9-T10 epidural injection done by Dr. Ernestina Patches.  She was also evaluated by neurosurgery and no additional treatment was advised.  MRI of thoracic spine performed on 04/19/22: No herniated disc, compression fracture, or spinal cord abnormality.  She had been going to physical therapy which has been helpful.  DDD (degenerative disc disease), lumbar-she had no tenderness on palpation.  She has intermittent discomfort.  Fibromyalgia -she continues to have some generalized pain and discomfort.  She had positive tender points.  She had been taking meloxicam 7.5 mg p.o. daily for many years.  She recently developed gastritis with nausea and abdominal discomfort.  Since she has discontinued meloxicam her symptoms have resolved.  Primary insomnia -her symptoms are controlled on Ambien 10 mg at bedtime which she takes for insomnia.  TMJ (temporomandibular joint syndrome)  Age-related osteoporosis without current pathological fracture - DEXA updated on 10/22/20: The BMD measured at Femur Neck Left is 0.560 g/cm2 with a T-score of -3.4. She has been on Prolia since 2019 initially prescribed by Dr. Chalmers Cater.  She will be getting her next Prolia injection in the first week of December.  History of gastroesophageal reflux (GERD)-she is treated for gastroesophageal reflux.  She states her symptoms got worse on meloxicam.  She is feeling better now.  Interstitial cystitis  History of IBS  Orders: No orders of the defined types were placed in this encounter.  No orders of the defined  types were placed in this encounter.   Follow-Up Instructions: Return in about 6 months (around 12/28/2022) for Osteoarthritis,FMS,OP.   Bo Merino, MD  Note - This record has been created using Editor, commissioning.  Chart creation errors have been  sought, but may not always  have been located. Such creation errors do not reflect on  the standard of medical care.

## 2022-06-18 NOTE — Procedures (Signed)
Thoracic epidural Steroid Injection - Interlaminar Approach with Fluoroscopic Guidance  Patient: Emily Ward      Date of Birth: 10/26/1955 MRN: [id number] PCP: Laurey Morale, MD      Visit Date: 06/07/2022   Universal Protocol:     Consent Given By: the patient  Position: PRONE  Additional Comments: Vital signs were monitored before and after the procedure. Patient was prepped and draped in the usual sterile fashion. The correct patient, procedure, and site was verified.   Injection Procedure Details:   Procedure diagnoses:  1. Thoracic radiculopathy      Meds Administered:  Meds ordered this encounter  Medications   methylPREDNISolone acetate (DEPO-MEDROL) injection 40 mg     Laterality: Right  Location/Site:  T9-10  Needle: 3.5 in., 20 ga. Tuohy  Needle Placement: Paramedian epidural  Findings:   -Comments: Excellent flow of contrast into the epidural space.  Procedure Details: The fluoroscope was aligned to square off the endplates at the level noted above.  The target area located is the inferior or caudal portion of the vertebral body which is essentially the superior lamina of the level indicated and the overlying structure was infiltrated with 2 to 3 mL of 1% lidocaine without epinephrine and a 22-gauge spinal needle was introduced down to the lamina until contact with bone.  This needle was utilized as a locating needle.  The fluoroscope was subsequently tilted in a caudal direction to achieve the most open interlaminar spacing noted.  Using a paramedian approach from the side mentioned above, the region overlying the interlaminar space was localized under fluoroscopic visualization and the soft tissues overlying this structure were infiltrated with 4 ml. of 1% Lidocaine without Epinephrine. The Tuohy needle was inserted into the epidural space using a paramedian approach and biplanar fluoroscopy.   The epidural space was localized using loss of  resistance along with lateral or counter oblique and bi-planar fluoroscopic views.  After negative aspirate for air, blood, and CSF, a 2 ml. volume of Isovue-250 was injected into the epidural space and the flow of contrast was observed. Radiographs were obtained for documentation purposes.    The injectate was administered into the level noted above.   Additional Comments:  The patient tolerated the procedure well Dressing: 2 x 2 sterile gauze and Band-Aid    Post-procedure details: Patient was observed during the procedure. Post-procedure instructions were reviewed.  Patient left the clinic in stable condition.

## 2022-06-18 NOTE — Progress Notes (Signed)
Emily Ward - 66 y.o. female MRN [id number]  Date of birth: Nov 02, 1955  Office Visit Note: Visit Date: 06/07/2022 PCP: Laurey Morale, MD Referred by: Laurey Morale, MD  Subjective: Chief Complaint  Patient presents with   Middle Back - Pain   HPI:  Emily Ward is a 66 y.o. female who comes in today at the request of Barnet Pall, FNP for planned Right T9-10 Lumbar Interlaminar epidural steroid injection with fluoroscopic guidance.  The patient has failed conservative care including home exercise, medications, time and activity modification.  This injection will be diagnostic and hopefully therapeutic.  Please see requesting physician notes for further details and justification.   ROS Otherwise per HPI.  Assessment & Plan: Visit Diagnoses:    ICD-10-CM   1. Thoracic radiculopathy  M54.14 XR C-ARM NO REPORT    Epidural Steroid injection    methylPREDNISolone acetate (DEPO-MEDROL) injection 40 mg      Plan: No additional findings.   Meds & Orders:  Meds ordered this encounter  Medications   methylPREDNISolone acetate (DEPO-MEDROL) injection 40 mg    Orders Placed This Encounter  Procedures   XR C-ARM NO REPORT   Epidural Steroid injection    Follow-up: Return for visit to requesting provider as needed.   Procedures: No procedures performed  Thoracic epidural Steroid Injection - Interlaminar Approach with Fluoroscopic Guidance  Patient: Emily Ward      Date of Birth: [birth year] MRN: [id number] PCP: Laurey Morale, MD      Visit Date: 06/07/2022   Universal Protocol:     Consent Given By: the patient  Position: PRONE  Additional Comments: Vital signs were monitored before and after the procedure. Patient was prepped and draped in the usual sterile fashion. The correct patient, procedure, and site was verified.   Injection Procedure Details:   Procedure diagnoses:  1. Thoracic radiculopathy      Meds Administered:   Meds ordered this encounter  Medications   methylPREDNISolone acetate (DEPO-MEDROL) injection 40 mg     Laterality: Right  Location/Site:  T9-10  Needle: 3.5 in., 20 ga. Tuohy  Needle Placement: Paramedian epidural  Findings:   -Comments: Excellent flow of contrast into the epidural space.  Procedure Details: The fluoroscope was aligned to square off the endplates at the level noted above.  The target area located is the inferior or caudal portion of the vertebral body which is essentially the superior lamina of the level indicated and the overlying structure was infiltrated with 2 to 3 mL of 1% lidocaine without epinephrine and a 22-gauge spinal needle was introduced down to the lamina until contact with bone.  This needle was utilized as a locating needle.  The fluoroscope was subsequently tilted in a caudal direction to achieve the most open interlaminar spacing noted.  Using a paramedian approach from the side mentioned above, the region overlying the interlaminar space was localized under fluoroscopic visualization and the soft tissues overlying this structure were infiltrated with 4 ml. of 1% Lidocaine without Epinephrine. The Tuohy needle was inserted into the epidural space using a paramedian approach and biplanar fluoroscopy.   The epidural space was localized using loss of resistance along with lateral or counter oblique and bi-planar fluoroscopic views.  After negative aspirate for air, blood, and CSF, a 2 ml. volume of Isovue-250 was injected into the epidural space and the flow of contrast was observed. Radiographs were obtained for documentation purposes.    The injectate was administered into  the level noted above.   Additional Comments:  The patient tolerated the procedure well Dressing: 2 x 2 sterile gauze and Band-Aid    Post-procedure details: Patient was observed during the procedure. Post-procedure instructions were reviewed.  Patient left the clinic in stable  condition.   Clinical History: No specialty comments available.     Objective:  VS:  HT:    WT:   BMI:     BP:122/84  HR:88bpm  TEMP: ( )  RESP:90 % Physical Exam Vitals and nursing note reviewed.  Constitutional:      General: She is not in acute distress.    Appearance: Normal appearance. She is not ill-appearing.  HENT:     Head: Normocephalic and atraumatic.     Right Ear: External ear normal.     Left Ear: External ear normal.  Eyes:     Extraocular Movements: Extraocular movements intact.  Cardiovascular:     Rate and Rhythm: Normal rate.     Pulses: Normal pulses.  Pulmonary:     Effort: Pulmonary effort is normal. No respiratory distress.  Abdominal:     General: There is no distension.     Palpations: Abdomen is soft.  Musculoskeletal:        General: Tenderness present.     Cervical back: Neck supple.     Right lower leg: No edema.     Left lower leg: No edema.     Comments: Patient has good distal strength with no pain over the greater trochanters.  No clonus or focal weakness.  Skin:    Findings: No erythema, lesion or rash.  Neurological:     General: No focal deficit present.     Mental Status: She is alert and oriented to person, place, and time.     Sensory: No sensory deficit.     Motor: No weakness or abnormal muscle tone.     Coordination: Coordination normal.  Psychiatric:        Mood and Affect: Mood normal.        Behavior: Behavior normal.      Imaging: No results found.

## 2022-06-22 ENCOUNTER — Encounter: Payer: Self-pay | Admitting: Internal Medicine

## 2022-06-28 ENCOUNTER — Encounter: Payer: Self-pay | Admitting: Rheumatology

## 2022-06-28 ENCOUNTER — Telehealth: Payer: Self-pay | Admitting: Rheumatology

## 2022-06-28 ENCOUNTER — Other Ambulatory Visit: Payer: Self-pay | Admitting: *Deleted

## 2022-06-28 ENCOUNTER — Ambulatory Visit: Payer: 59 | Attending: Rheumatology | Admitting: Rheumatology

## 2022-06-28 VITALS — BP 109/74 | HR 87 | Resp 15 | Ht 65.0 in | Wt 108.0 lb

## 2022-06-28 DIAGNOSIS — M5030 Other cervical disc degeneration, unspecified cervical region: Secondary | ICD-10-CM | POA: Diagnosis not present

## 2022-06-28 DIAGNOSIS — M62838 Other muscle spasm: Secondary | ICD-10-CM | POA: Diagnosis not present

## 2022-06-28 DIAGNOSIS — F5101 Primary insomnia: Secondary | ICD-10-CM

## 2022-06-28 DIAGNOSIS — M19041 Primary osteoarthritis, right hand: Secondary | ICD-10-CM | POA: Diagnosis not present

## 2022-06-28 DIAGNOSIS — M797 Fibromyalgia: Secondary | ICD-10-CM

## 2022-06-28 DIAGNOSIS — M19042 Primary osteoarthritis, left hand: Secondary | ICD-10-CM

## 2022-06-28 DIAGNOSIS — M546 Pain in thoracic spine: Secondary | ICD-10-CM | POA: Diagnosis not present

## 2022-06-28 DIAGNOSIS — Z8719 Personal history of other diseases of the digestive system: Secondary | ICD-10-CM

## 2022-06-28 DIAGNOSIS — M810 Age-related osteoporosis without current pathological fracture: Secondary | ICD-10-CM

## 2022-06-28 DIAGNOSIS — M5136 Other intervertebral disc degeneration, lumbar region: Secondary | ICD-10-CM

## 2022-06-28 DIAGNOSIS — M51369 Other intervertebral disc degeneration, lumbar region without mention of lumbar back pain or lower extremity pain: Secondary | ICD-10-CM

## 2022-06-28 DIAGNOSIS — M26609 Unspecified temporomandibular joint disorder, unspecified side: Secondary | ICD-10-CM

## 2022-06-28 DIAGNOSIS — N3010 Interstitial cystitis (chronic) without hematuria: Secondary | ICD-10-CM

## 2022-06-28 NOTE — Telephone Encounter (Signed)
Patient states she would like to continue physical therapy at [facility].  Patient is not sure if a new referral is needed to continue.

## 2022-06-28 NOTE — Telephone Encounter (Signed)
yes

## 2022-07-03 ENCOUNTER — Ambulatory Visit (AMBULATORY_SURGERY_CENTER): Payer: Self-pay

## 2022-07-03 VITALS — Ht 65.0 in | Wt 110.0 lb

## 2022-07-03 DIAGNOSIS — Z1211 Encounter for screening for malignant neoplasm of colon: Secondary | ICD-10-CM

## 2022-07-03 MED ORDER — NA SULFATE-K SULFATE-MG SULF 17.5-3.13-1.6 GM/177ML PO SOLN: 1.0000 | Freq: Once | ORAL | 0 refills | Status: AC

## 2022-07-03 NOTE — Progress Notes (Signed)

## 2022-07-05 ENCOUNTER — Encounter: Payer: Self-pay | Admitting: Family Medicine

## 2022-07-05 ENCOUNTER — Ambulatory Visit: Payer: 59 | Admitting: Family Medicine

## 2022-07-05 VITALS — BP 102/76 | HR 78 | Temp 98.1°F | Wt 109.6 lb

## 2022-07-05 DIAGNOSIS — N3010 Interstitial cystitis (chronic) without hematuria: Secondary | ICD-10-CM | POA: Diagnosis not present

## 2022-07-05 DIAGNOSIS — R300 Dysuria: Secondary | ICD-10-CM

## 2022-07-05 LAB — POCT URINALYSIS DIPSTICK
Bilirubin, UA: NEGATIVE
Blood, UA: NEGATIVE
Glucose, UA: NEGATIVE
Ketones, UA: NEGATIVE
Leukocytes, UA: NEGATIVE
Nitrite, UA: NEGATIVE
Protein, UA: NEGATIVE
Spec Grav, UA: 1.015 (ref 1.010–1.025)
Urobilinogen, UA: NEGATIVE E.U./dL — AB
pH, UA: 6 (ref 5.0–8.0)

## 2022-07-05 NOTE — Progress Notes (Signed)
Subjective:    Patient ID: Emily Ward, female    DOB: [birth year], 66 y.o.   MRN: [id number]  Chief Complaint  Patient presents with   Urinary Tract Infection    Hx of interstitial cystitis, hurts when urinating, pain in lower abdomen and lower right side of back. Couple days ago (3).    HPI Patient is a 66 year old female with PMH SIG for COPD with emphysema, IBS, hyperparathyroidism, fibromyalgia, osteoporosis, IC, insomnia, HLD, MGUS followed by Dr. Sarajane Jews and seen for acute concern.  Patient endorses increased urinary frequency, shivering with bladder spasms, dysuria x3 days.  Pt increased intake of water.  Was having left sided periumbilical abdominal pain and right-sided low back pain.  Denies movement of pain, fever, nausea, vomiting, eating spicy food or increased caffeine intake.  Pt endorses history of interstitial cystitis, typically has hematuria on UA during flares.  Previously seen by urology several years ago.  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     Fibromyalgia    sees Dr. 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   Hyperlipidemia    Migraines    MVP (mitral valve prolapse)    no daily medication   Osteoporosis    Peptic ulcer    no problems    Allergies  Allergen Reactions   Divalproex Sodium     Drug inducted hepatitis Other reaction(s): Unknown Other reaction(s): Unknown   Nsaids     Upset stomach    ROS General: Denies fever, chills, night sweats, changes in weight, changes in appetite HEENT: Denies headaches, ear pain, changes in vision, rhinorrhea, sore throat CV: Denies CP, palpitations, SOB, orthopnea Pulm: Denies SOB, cough, wheezing GI: Denies abdominal pain, nausea, vomiting, diarrhea + constipation with decreased  fiber intake, left-sided upper periumbilical pain GU: Denies hematuria, vaginal discharge + dysuria, frequency Msk: Denies muscle cramps, joint pains Neuro: Denies weakness, numbness, tingling Skin: Denies rashes, bruising Psych: Denies depression, anxiety, hallucinations     Objective:    Blood pressure 102/76, pulse 78, temperature 98.1 F (36.7 C), temperature source Oral, weight 109 lb 9.6 oz (49.7 kg), last menstrual period 09/03/2000, SpO2 95 %.   Gen. Pleasant, well-nourished, in no distress, normal affect   HEENT: [place]/AT, face symmetric, conjunctiva clear, no scleral icterus, PERRLA, EOMI, nares patent without drainage Lungs: no accessory muscle use Cardiovascular: RRR, no peripheral edema Abdomen: BS present, soft, NT/ND, no hepatosplenomegaly.  No CVA tenderness. Neuro:  A&Ox3, CN II-XII intact, normal gait Skin:  Warm, no lesions/ rash   Wt Readings from Last 3 Encounters:  07/05/22 109 lb 9.6 oz (49.7 kg)  07/03/22 110 lb (49.9 kg)  06/28/22 108 lb (49 kg)    Lab Results  Component Value Date   WBC 5.0 01/01/2022   HGB 14.0 01/01/2022   HCT 41.3 01/01/2022   PLT 204 01/01/2022   GLUCOSE 95 01/01/2022   CHOL 253 (H) 09/08/2021   TRIG 60.0 09/08/2021   HDL 92.30 09/08/2021   LDLDIRECT 153.5 04/03/2012   LDLCALC 148 (H) 09/08/2021   ALT 9 01/01/2022   AST 21 01/01/2022   NA 138 01/01/2022   K 4.5 01/01/2022   CL 103 01/01/2022   CREATININE 0.65 01/01/2022   BUN 17  01/01/2022   CO2 27 01/01/2022   TSH 1.97 09/08/2021   HGBA1C 5.5 09/08/2021    Assessment/Plan:  IC (interstitial cystitis)  Dysuria - Plan: POCT urinalysis dipstick  UA negative, SG 1.015.  No hematuria noted.  Patient advised to continue increasing p.o. intake of fluids.  Patient encouraged to schedule follow-up appointment with urology.  Continue to avoid foods known to increase IC symptoms.  Follow-up for continued or worsening symptoms.  F/u as needed  Grier Mitts, MD

## 2022-07-12 ENCOUNTER — Encounter: Payer: Self-pay | Admitting: Internal Medicine

## 2022-07-16 ENCOUNTER — Other Ambulatory Visit: Payer: Self-pay | Admitting: Pulmonary Disease

## 2022-07-19 ENCOUNTER — Encounter: Payer: Self-pay | Admitting: Internal Medicine

## 2022-07-19 ENCOUNTER — Ambulatory Visit (AMBULATORY_SURGERY_CENTER): Payer: 59 | Admitting: Internal Medicine

## 2022-07-19 VITALS — BP 96/54 | HR 90 | Temp 96.4°F | Resp 18 | Ht 65.0 in | Wt 110.0 lb

## 2022-07-19 DIAGNOSIS — Z1211 Encounter for screening for malignant neoplasm of colon: Secondary | ICD-10-CM

## 2022-07-19 DIAGNOSIS — D120 Benign neoplasm of cecum: Secondary | ICD-10-CM | POA: Diagnosis not present

## 2022-07-19 HISTORY — PX: COLONOSCOPY: SHX174

## 2022-07-19 MED ORDER — SODIUM CHLORIDE 0.9 % IV SOLN: 500.0000 mL | INTRAVENOUS | Status: DC

## 2022-07-19 NOTE — Progress Notes (Signed)
Report to PACU, RN, vss, BBS= Clear.  

## 2022-07-19 NOTE — Op Note (Signed)
[facility] Patient Name: Emily Ward Procedure Date: 07/19/2022 1:44 PM MRN: [id number] Endoscopist: Georgian Co , , [id number] Age: 66 Referring MD:  Date of Birth: 06/16/1956 Gender: Female Account #: [id number] Procedure:                Colonoscopy Indications:              Screening for colorectal malignant neoplasm Medicines:                Monitored Anesthesia Care Procedure:                Pre-Anesthesia Assessment:                           - Prior to the procedure, a History and Physical                            was performed, and patient medications and                            allergies were reviewed. The patient's tolerance of                            previous anesthesia was also reviewed. The risks                            and benefits of the procedure and the sedation                            options and risks were discussed with the patient.                            All questions were answered, and informed consent                            was obtained. Prior Anticoagulants: The patient has                            taken no anticoagulant or antiplatelet agents. ASA                            Grade Assessment: II - A patient with mild systemic                            disease. After reviewing the risks and benefits,                            the patient was deemed in satisfactory condition to                            undergo the procedure.                           After obtaining informed consent, the colonoscope  was passed under direct vision. Throughout the                            procedure, the patient's blood pressure, pulse, and                            oxygen saturations were monitored continuously. The                            PCF-HQ190L Colonoscope was introduced through the                            anus and advanced to the the cecum, identified by                             appendiceal orifice and ileocecal valve. The                            colonoscopy was performed without difficulty. The                            patient tolerated the procedure well. The quality                            of the bowel preparation was good. The ileocecal                            valve, appendiceal orifice, and rectum were                            photographed. Scope In: 1:53:24 PM Scope Out: 2:41:59 PM Scope Withdrawal Time: 0 hours 36 minutes 51 seconds  Total Procedure Duration: 0 hours 48 minutes 35 seconds  Findings:                 A 20 mm polyp was found in the cecum. The polyp was                            sessile. The polyp was removed with a piecemeal                            technique using a cold snare. Resection and                            retrieval were complete.                           Multiple diverticula were found in the sigmoid                            colon.                           Non-bleeding internal hemorrhoids were found during  retroflexion. Complications:            No immediate complications. Estimated Blood Loss:     Estimated blood loss was minimal. Impression:               - One 20 mm polyp in the cecum, removed piecemeal                            using a cold snare. Resected and retrieved.                           - Diverticulosis in the sigmoid colon.                           - Non-bleeding internal hemorrhoids. Recommendation:           - Discharge patient to home (with escort).                           - Await pathology results.                           - Repeat colonoscopy in 6 months for surveillance                            after piecemeal polypectomy.                           - The findings and recommendations were discussed                            with the patient. Dr Georgian Co "Lyndee Leo" Lorenso Courier,  07/19/2022 2:45:49 PM

## 2022-07-19 NOTE — Progress Notes (Signed)
Called to room to assist during endoscopic procedure.  Patient ID and intended procedure confirmed with present staff. Received instructions for my participation in the procedure from the performing physician.  

## 2022-07-19 NOTE — Patient Instructions (Signed)
Resume previous diet and medications. Awaiting pathology results. Repeat Colonoscopy in 6 months. Handouts provided on Colon Polyps, Diverticulosis and Hemorrhoids.   YOU HAD AN ENDOSCOPIC PROCEDURE TODAY AT [facility]:   Refer to the procedure report that was given to you for any specific questions about what was found during the examination.  If the procedure report does not answer your questions, please call your gastroenterologist to clarify.  If you requested that your care partner not be given the details of your procedure findings, then the procedure report has been included in a sealed envelope for you to review at your convenience later.  YOU SHOULD EXPECT: Some feelings of bloating in the abdomen. Passage of more gas than usual.  Walking can help get rid of the air that was put into your GI tract during the procedure and reduce the bloating. If you had a lower endoscopy (such as a colonoscopy or flexible sigmoidoscopy) you may notice spotting of blood in your stool or on the toilet paper. If you underwent a bowel prep for your procedure, you may not have a normal bowel movement for a few days.  Please Note:  You might notice some irritation and congestion in your nose or some drainage.  This is from the oxygen used during your procedure.  There is no need for concern and it should clear up in a day or so.  SYMPTOMS TO REPORT IMMEDIATELY:  Following lower endoscopy (colonoscopy or flexible sigmoidoscopy):  Excessive amounts of blood in the stool  Significant tenderness or worsening of abdominal pains  Swelling of the abdomen that is new, acute  Fever of 100F or higher  For urgent or emergent issues, a gastroenterologist can be reached at any hour by calling [phone]. Do not use MyChart messaging for urgent concerns.    DIET:  We do recommend a small meal at first, but then you may proceed to your regular diet.  Drink plenty of fluids but you should avoid  alcoholic beverages for 24 hours.  ACTIVITY:  You should plan to take it easy for the rest of today and you should NOT DRIVE or use heavy machinery until tomorrow (because of the sedation medicines used during the test).    FOLLOW UP: Our staff will call the number listed on your records the next business day following your procedure.  We will call around 7:15- 8:00 am to check on you and address any questions or concerns that you may have regarding the information given to you following your procedure. If we do not reach you, we will leave a message.     If any biopsies were taken you will be contacted by phone or by letter within the next 1-3 weeks.  Please call us at [phone] if you have not heard about the biopsies in 3 weeks.    SIGNATURES/CONFIDENTIALITY: You and/or your care partner have signed paperwork which will be entered into your electronic medical record.  These signatures attest to the fact that that the information above on your After Visit Summary has been reviewed and is understood.  Full responsibility of the confidentiality of this discharge information lies with you and/or your care-partner.

## 2022-07-19 NOTE — Progress Notes (Signed)
Vital signs checked by:DT  The patient states no changes in medical or surgical history since pre-visit screening on 07/03/22

## 2022-07-19 NOTE — Progress Notes (Signed)
GASTROENTEROLOGY PROCEDURE H&P NOTE   Primary Care Physician: Laurey Morale, MD    Reason for Procedure:   Colon cancer screening  Plan:    Colonoscopy  Patient is appropriate for endoscopic procedure(s) in the ambulatory ([place]) setting.  The nature of the procedure, as well as the risks, benefits, and alternatives were carefully and thoroughly reviewed with the patient. Ample time for discussion and questions allowed. The patient understood, was satisfied, and agreed to proceed.     HPI: Emily Ward is a 66 y.o. female who presents for colonoscopy for colon cancer screening. Denies blood in stools, changes in bowel habits, or unintentional weight loss. Denies family history of colon cancer.   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     Fibromyalgia    sees Dr. 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   Hyperlipidemia    Migraines    MVP (mitral valve prolapse)    no daily medication   Osteoporosis    Peptic ulcer    no problems    Past Surgical History:  Procedure Laterality Date   CERVICAL LAMINECTOMY  2006   [place]  06/2002   CYSTOSCOPY N/A 09/01/2018   Procedure: CYSTOSCOPY;  Surgeon: Megan Salon, MD;  Location: [facility];  Service: Gynecology;  Laterality: N/A;   DILATATION & CURETTAGE/HYSTEROSCOPY WITH MYOSURE N/A 04/12/2017   Procedure: DILATATION & CURETTAGE/HYSTEROSCOPY;  Surgeon: Megan Salon, MD;  Location: [facility];  Service: Gynecology;  Laterality: N/A;   epidural thoracic spine injection  06/07/2022   facet joint block  03/30/2021   per patient, performed by Dr. Ernestina Patches   facet joint block  01/31/2022   per patient, performed by Dr. Ernestina Patches    HYSTEROSCOPY     years ago with Dr Radene Knee-? [place] SALPINGECTOMY Bilateral 09/01/2018   Procedure: TOTAL LAPAROSCOPIC HYSTERECTOMY WITH SALPINGECTOMY  BSO, REPAIR OF PERINEAL LACERATION;  Surgeon: Megan Salon, MD;  Location: [facility];  Service: Gynecology;  Laterality: Bilateral;  possible BSO   URETHRAL DILATION      Prior to Admission medications   Medication Sig Start Date End Date Taking? Authorizing Provider  butalbital-acetaminophen-caffeine (FIORICET) 50-325-40 MG tablet TAKE 1 TABLET BY MOUTH EVERY 6 HOURS AS NEEDED 05/29/22  Yes Laurey Morale, MD  CALCIUM PO Take 1 tablet by mouth daily.   Yes [provider]  chlorzoxazone (PARAFON) 500 MG tablet TAKE 1 TABLET BY MOUTH FOUR TIMES DAILY AS NEEDED FOR MUSCLE SPASMS 09/13/21  Yes Laurey Morale, MD  cholecalciferol (VITAMIN D) 1000 units tablet Take 2,000 Units by mouth daily.    Yes [provider]  Cyanocobalamin (VITAMIN B-12 PO) Take 1 tablet by mouth daily.    Yes [provider]  cyclobenzaprine (FLEXERIL) 10 MG tablet TAKE 1 TABLET BY MOUTH THREE TIMES DAILY AS NEEDED FOR MUSCLE SPASMS 08/09/21  Yes Laurey Morale, MD  estradiol (ESTRACE) 1 MG tablet Take 1 tablet (1 mg total) by mouth daily. 10/20/21  Yes Megan Salon, MD  Fluticasone-Umeclidin-Vilant (TRELEGY ELLIPTA) 100-62.5-25 MCG/ACT AEPB INHALE 1 PUFF INTO THE LUNGS DAILY 07/16/22  Yes Mannam,  Praveen, MD  zolpidem (AMBIEN) 10 MG tablet TAKE 1 TABLET(10 MG) BY MOUTH AT BEDTIME 03/28/22  Yes Laurey Morale, MD  albuterol (VENTOLIN HFA) 108 (90 Base) MCG/ACT inhaler Inhale 1-2 puffs into the lungs every 6 (six) hours as needed for wheezing or shortness of breath. 06/12/22   Mannam, Hart Robinsons, MD  cetirizine (ZYRTEC) 10 MG tablet Take 10 mg by mouth daily as needed for allergies (spring time). Patient not taking: Reported on 07/05/2022    [provider]  denosumab (PROLIA) 60 MG/ML SOSY  injection Inject 60 mg into the skin every 6 (six) months.    [provider]  ipratropium-albuterol (DUONEB) 0.5-2.5 (3) MG/3ML SOLN Take 3 mLs by nebulization every 6 (six) hours as needed. 03/20/22   Marshell Garfinkel, MD  zolmitriptan (ZOMIG) 5 MG tablet TAKE 1 TABLET BY MOUTH AS NEEDED AS DIRECTED 08/24/21   Laurey Morale, MD    Current Outpatient Medications  Medication Sig Dispense Refill   butalbital-acetaminophen-caffeine (FIORICET) 50-325-40 MG tablet TAKE 1 TABLET BY MOUTH EVERY 6 HOURS AS NEEDED 120 tablet 5   CALCIUM PO Take 1 tablet by mouth daily.     chlorzoxazone (PARAFON) 500 MG tablet TAKE 1 TABLET BY MOUTH FOUR TIMES DAILY AS NEEDED FOR MUSCLE SPASMS 120 tablet 5   cholecalciferol (VITAMIN D) 1000 units tablet Take 2,000 Units by mouth daily.      Cyanocobalamin (VITAMIN B-12 PO) Take 1 tablet by mouth daily.      cyclobenzaprine (FLEXERIL) 10 MG tablet TAKE 1 TABLET BY MOUTH THREE TIMES DAILY AS NEEDED FOR MUSCLE SPASMS 270 tablet 1   estradiol (ESTRACE) 1 MG tablet Take 1 tablet (1 mg total) by mouth daily. 90 tablet 4   Fluticasone-Umeclidin-Vilant (TRELEGY ELLIPTA) 100-62.5-25 MCG/ACT AEPB INHALE 1 PUFF INTO THE LUNGS DAILY 60 each 3   zolpidem (AMBIEN) 10 MG tablet TAKE 1 TABLET(10 MG) BY MOUTH AT BEDTIME 30 tablet 5   albuterol (VENTOLIN HFA) 108 (90 Base) MCG/ACT inhaler Inhale 1-2 puffs into the lungs every 6 (six) hours as needed for wheezing or shortness of breath. 18 g 2   cetirizine (ZYRTEC) 10 MG tablet Take 10 mg by mouth daily as needed for allergies (spring time). (Patient not taking: Reported on 07/05/2022)     denosumab (PROLIA) 60 MG/ML SOSY injection Inject 60 mg into the skin every 6 (six) months.     ipratropium-albuterol (DUONEB) 0.5-2.5 (3) MG/3ML SOLN Take 3 mLs by nebulization every 6 (six) hours as needed. 360 mL 11   zolmitriptan (ZOMIG) 5 MG tablet TAKE 1 TABLET BY MOUTH AS NEEDED AS DIRECTED 30 tablet 5   Current Facility-Administered  Medications  Medication Dose Route Frequency Provider Last Rate Last Admin   0.9 %  sodium chloride infusion  500 mL Intravenous Continuous Sharyn Creamer, MD        Allergies as of 07/19/2022 - Review Complete 07/19/2022  Allergen Reaction Noted   Divalproex sodium  03/28/2017   Nsaids  05/09/2007    Family History  Problem Relation Age of Onset   Fibrocystic breast disease Mother        lumpectomy in 50-60   Heart attack Father    Congestive Heart Failure Father    Diabetes type II Sister        and ? brother   Arthritis Son        psoriatic   Cancer Other        breast/fhx   Heart disease Other  fhx   Colon cancer Neg Hx    Esophageal cancer Neg Hx    Rectal cancer Neg Hx    Stomach cancer Neg Hx    Colon polyps Neg Hx     Social History   Socioeconomic History   Marital status: Married    Spouse name: Not on file   Number of children: Not on file   Years of education: Not on file   Highest education level: Associate degree: academic program  Occupational History   Not on file  Tobacco Use   Smoking status: Former    Packs/day: 1.00    Years: 20.00    Total pack years: 20.00    Types: Cigarettes    Quit date: 04/02/2013    Years since quitting: 9.3    Passive exposure: Never   Smokeless tobacco: Never  Vaping Use   Vaping Use: Never used  Substance and Sexual Activity   Alcohol use: Yes    Alcohol/week: 1.0 standard drink of alcohol    Types: 1 Glasses of wine per week    Comment: occ   Drug use: Never   Sexual activity: Never    Partners: Male  Other Topics Concern   Not on file  Social History Narrative   Not on file   Social Determinants of Health   Financial Resource Strain: Low Risk  (02/23/2022)   Overall Financial Resource Strain (CARDIA)    Difficulty of Paying Living Expenses: Not hard at all  Food Insecurity: No Food Insecurity (02/23/2022)   Hunger Vital Sign    Worried About Running Out of Food in the Last Year: Never true     Ran Out of Food in the Last Year: Never true  Transportation Needs: No Transportation Needs (02/23/2022)   PRAPARE - Hydrologist (Medical): No    Lack of Transportation (Non-Medical): No  Physical Activity: Insufficiently Active (02/23/2022)   Exercise Vital Sign    Days of Exercise per Week: 4 days    Minutes of Exercise per Session: 10 min  Stress: No Stress Concern Present (02/23/2022)   [place]    Feeling of Stress : Only a little  Social Connections: Moderately Isolated (02/23/2022)   Social Connection and Isolation Panel [NHANES]    Frequency of Communication with Friends and Family: More than three times a week    Frequency of Social Gatherings with Friends and Family: Three times a week    Attends Religious Services: Never    Active Member of Clubs or Organizations: No    Attends Music therapist: Not on file    Marital Status: Married  Human resources officer Violence: Not on file    Physical Exam: Vital signs in last 24 hours: BP 111/68   Pulse 93   Temp (!) 96.4 F (35.8 C)   Ht '5\' 5"'$  (1.651 m)   Wt 110 lb (49.9 kg)   LMP 09/03/2000 (Approximate)   SpO2 97%   BMI 18.30 kg/m  GEN: NAD EYE: Sclerae anicteric ENT: MMM CV: Non-tachycardic Pulm: No increased work of breathing GI: Soft, NT/ND NEURO:  Alert & Oriented   Christia Reading, MD [facility]  07/19/2022 1:22 PM

## 2022-07-20 ENCOUNTER — Telehealth: Payer: Self-pay

## 2022-07-20 NOTE — Telephone Encounter (Signed)
Left message on follow up call. 

## 2022-07-23 ENCOUNTER — Other Ambulatory Visit: Payer: Self-pay | Admitting: Physical Medicine and Rehabilitation

## 2022-07-23 ENCOUNTER — Encounter: Payer: Self-pay | Admitting: Physical Medicine and Rehabilitation

## 2022-07-23 DIAGNOSIS — M5414 Radiculopathy, thoracic region: Secondary | ICD-10-CM

## 2022-07-24 ENCOUNTER — Encounter: Payer: Self-pay | Admitting: Internal Medicine

## 2022-08-01 ENCOUNTER — Telehealth: Payer: Self-pay | Admitting: Pharmacist

## 2022-08-01 NOTE — Telephone Encounter (Signed)
Patient due for Prolia on 08/01/22. Appears to be scheduled at [place] on 08/07/22 at [facility]. Will f/u to ensure completed  Knox Saliva, PharmD, MPH, BCPS, CPP Clinical Pharmacist (Rheumatology and Pulmonology)

## 2022-08-02 ENCOUNTER — Encounter: Payer: Self-pay | Admitting: Physical Medicine and Rehabilitation

## 2022-08-02 ENCOUNTER — Telehealth: Payer: Self-pay

## 2022-08-02 NOTE — Telephone Encounter (Signed)
Patient called and stated she needs the Diazepam sent in for her before her procedure on 08/13/22

## 2022-08-03 ENCOUNTER — Other Ambulatory Visit: Payer: Self-pay | Admitting: Physical Medicine and Rehabilitation

## 2022-08-03 MED ORDER — DIAZEPAM 5 MG PO TABS: ORAL_TABLET | ORAL | 0 refills | Status: DC

## 2022-08-07 ENCOUNTER — Ambulatory Visit (INDEPENDENT_AMBULATORY_CARE_PROVIDER_SITE_OTHER): Payer: 59 | Admitting: *Deleted

## 2022-08-07 VITALS — BP 109/73 | HR 97 | Temp 98.0°F | Resp 18 | Ht 65.0 in | Wt 107.8 lb

## 2022-08-07 DIAGNOSIS — M810 Age-related osteoporosis without current pathological fracture: Secondary | ICD-10-CM | POA: Diagnosis not present

## 2022-08-07 MED ORDER — DENOSUMAB 60 MG/ML ~~LOC~~ SOSY
60.0000 mg | PREFILLED_SYRINGE | Freq: Once | SUBCUTANEOUS | Status: AC
  Administered 2022-08-07: 60 mg via SUBCUTANEOUS
  Filled 2022-08-07: qty 1

## 2022-08-07 NOTE — Progress Notes (Signed)
Diagnosis: Osteoporosis  Provider:  Marshell Garfinkel MD  Procedure: Injection  Prolia (Denosumab), Dose: 60 mg, Site: subcutaneous, Number of injections: 1  Post Care: Observation period completed  Discharge: Condition: Good, Destination: Home . AVS provided to patient.   Performed by:  Oren Beckmann, RN

## 2022-08-08 ENCOUNTER — Ambulatory Visit: Payer: 59 | Admitting: Family Medicine

## 2022-08-13 ENCOUNTER — Ambulatory Visit: Payer: Self-pay

## 2022-08-13 ENCOUNTER — Ambulatory Visit: Payer: 59 | Admitting: Physical Medicine and Rehabilitation

## 2022-08-13 VITALS — BP 113/79 | HR 93

## 2022-08-13 DIAGNOSIS — M5414 Radiculopathy, thoracic region: Secondary | ICD-10-CM

## 2022-08-13 MED ORDER — METHYLPREDNISOLONE ACETATE 80 MG/ML IJ SUSP
80.0000 mg | Freq: Once | INTRAMUSCULAR | Status: AC
  Administered 2022-08-13: 80 mg

## 2022-08-13 NOTE — Progress Notes (Signed)
Emily Ward - 66 y.o. female MRN [id number]  Date of birth: 09-06-1955  Office Visit Note: Visit Date: 08/13/2022 PCP: Laurey Morale, MD Referred by: Laurey Morale, MD  Subjective: Chief Complaint  Patient presents with   Spine - Pain    Thoracic    HPI:  Emily Ward is a 66 y.o. female who comes in today for planned repeat Right T9-10  Thoracic Interlaminar epidural steroid injection with fluoroscopic guidance.  The patient has failed conservative care including home exercise, medications, time and activity modification.  This injection will be diagnostic and hopefully therapeutic.  Please see requesting physician notes for further details and justification. Patient received more than 50% pain relief from prior injection.   Referring: Barnet Pall, FNP and Dr. Bo Merino   ROS Otherwise per HPI.  Assessment & Plan: Visit Diagnoses:    ICD-10-CM   1. Thoracic radiculopathy  M54.14 XR C-ARM NO REPORT    Epidural Steroid injection    methylPREDNISolone acetate (DEPO-MEDROL) injection 80 mg      Plan: No additional findings.   Meds & Orders:  Meds ordered this encounter  Medications   methylPREDNISolone acetate (DEPO-MEDROL) injection 80 mg    Orders Placed This Encounter  Procedures   XR C-ARM NO REPORT   Epidural Steroid injection    Follow-up: Return for visit to requesting provider as needed.   Procedures: No procedures performed  Thoracic epidural Steroid Injection - Interlaminar Approach with Fluoroscopic Guidance  Patient: Emily Ward      Date of Birth: 04-Aug-1956 MRN: [id number] PCP: Laurey Morale, MD      Visit Date: 08/13/2022   Universal Protocol:     Consent Given By: the patient  Position: PRONE  Additional Comments: Vital signs were monitored before and after the procedure. Patient was prepped and draped in the usual sterile fashion. The correct patient, procedure, and site was  verified.   Injection Procedure Details:   Procedure diagnoses:  1. Thoracic radiculopathy      Meds Administered:  Meds ordered this encounter  Medications   methylPREDNISolone acetate (DEPO-MEDROL) injection 80 mg     Laterality: Right  Location/Site:  T9-10  Needle: 3.5 in., 20 ga. Tuohy  Needle Placement: Paramedian epidural  Findings:   -Comments: Excellent flow of contrast along the nerve and into the epidural space.  Procedure Details: The fluoroscope was aligned to square off the endplates at the level noted above.  The target area located is the inferior or caudal portion of the vertebral body which is essentially the superior lamina of the level indicated and the overlying structure was infiltrated with 2 to 3 mL of 1% lidocaine without epinephrine and a 22-gauge spinal needle was introduced down to the lamina until contact with bone.  This needle was utilized as a locating needle.  The fluoroscope was subsequently tilted in a caudal direction to achieve the most open interlaminar spacing noted.  Using a paramedian approach from the side mentioned above, the region overlying the interlaminar space was localized under fluoroscopic visualization and the soft tissues overlying this structure were infiltrated with 4 ml. of 1% Lidocaine without Epinephrine. The Tuohy needle was inserted into the epidural space using a paramedian approach and biplanar fluoroscopy.   The epidural space was localized using loss of resistance along with lateral or counter oblique and bi-planar fluoroscopic views.  After negative aspirate for air, blood, and CSF, a 2 ml. volume of Isovue-250 was injected into the  epidural space and the flow of contrast was observed. Radiographs were obtained for documentation purposes.    The injectate was administered into the level noted above.   Additional Comments:  The patient tolerated the procedure well Dressing: 2 x 2 sterile gauze and Band-Aid     Post-procedure details: Patient was observed during the procedure. Post-procedure instructions were reviewed.  Patient left the clinic in stable condition.    Clinical History: No specialty comments available.     Objective:  VS:  HT:    WT:   BMI:     BP:113/79  HR:93bpm  TEMP: ( )  RESP:  Physical Exam Vitals and nursing note reviewed.  Constitutional:      General: She is not in acute distress.    Appearance: Normal appearance. She is not ill-appearing.  HENT:     Head: Normocephalic and atraumatic.     Right Ear: External ear normal.     Left Ear: External ear normal.  Eyes:     Extraocular Movements: Extraocular movements intact.  Cardiovascular:     Rate and Rhythm: Normal rate.     Pulses: Normal pulses.  Pulmonary:     Effort: Pulmonary effort is normal. No respiratory distress.  Abdominal:     General: There is no distension.     Palpations: Abdomen is soft.  Musculoskeletal:        General: Tenderness present.     Cervical back: Neck supple.     Right lower leg: No edema.     Left lower leg: No edema.     Comments: Patient has good distal strength with no pain over the greater trochanters.  No clonus or focal weakness.  Skin:    Findings: No erythema, lesion or rash.  Neurological:     General: No focal deficit present.     Mental Status: She is alert and oriented to person, place, and time.     Sensory: No sensory deficit.     Motor: No weakness or abnormal muscle tone.     Coordination: Coordination normal.  Psychiatric:        Mood and Affect: Mood normal.        Behavior: Behavior normal.      Imaging: No results found.

## 2022-08-13 NOTE — Progress Notes (Signed)
Numeric Pain Rating Scale and Functional Assessment Average Pain 2   In the last MONTH (on 0-10 scale) has pain interfered with the following?  1. General activity like being  able to carry out your everyday physical activities such as walking, climbing stairs, carrying groceries, or moving a chair?  Rating(6)   +Driver, -BT, -Dye Allergies.  Thoracic back pain on right side. Twisting, reaching and lifting makes pain worse

## 2022-08-13 NOTE — Patient Instructions (Signed)

## 2022-08-13 NOTE — Procedures (Signed)
Thoracic epidural Steroid Injection - Interlaminar Approach with Fluoroscopic Guidance  Patient: Emily Ward      Date of Birth: 04-28-1956 MRN: [id number] PCP: Laurey Morale, MD      Visit Date: 08/13/2022   Universal Protocol:     Consent Given By: the patient  Position: PRONE  Additional Comments: Vital signs were monitored before and after the procedure. Patient was prepped and draped in the usual sterile fashion. The correct patient, procedure, and site was verified.   Injection Procedure Details:   Procedure diagnoses:  1. Thoracic radiculopathy      Meds Administered:  Meds ordered this encounter  Medications   methylPREDNISolone acetate (DEPO-MEDROL) injection 80 mg     Laterality: Right  Location/Site:  T9-10  Needle: 3.5 in., 20 ga. Tuohy  Needle Placement: Paramedian epidural  Findings:   -Comments: Excellent flow of contrast along the nerve and into the epidural space.  Procedure Details: The fluoroscope was aligned to square off the endplates at the level noted above.  The target area located is the inferior or caudal portion of the vertebral body which is essentially the superior lamina of the level indicated and the overlying structure was infiltrated with 2 to 3 mL of 1% lidocaine without epinephrine and a 22-gauge spinal needle was introduced down to the lamina until contact with bone.  This needle was utilized as a locating needle.  The fluoroscope was subsequently tilted in a caudal direction to achieve the most open interlaminar spacing noted.  Using a paramedian approach from the side mentioned above, the region overlying the interlaminar space was localized under fluoroscopic visualization and the soft tissues overlying this structure were infiltrated with 4 ml. of 1% Lidocaine without Epinephrine. The Tuohy needle was inserted into the epidural space using a paramedian approach and biplanar fluoroscopy.   The epidural space was localized  using loss of resistance along with lateral or counter oblique and bi-planar fluoroscopic views.  After negative aspirate for air, blood, and CSF, a 2 ml. volume of Isovue-250 was injected into the epidural space and the flow of contrast was observed. Radiographs were obtained for documentation purposes.    The injectate was administered into the level noted above.   Additional Comments:  The patient tolerated the procedure well Dressing: 2 x 2 sterile gauze and Band-Aid    Post-procedure details: Patient was observed during the procedure. Post-procedure instructions were reviewed.  Patient left the clinic in stable condition.

## 2022-08-17 ENCOUNTER — Encounter: Payer: Self-pay | Admitting: Physical Medicine and Rehabilitation

## 2022-09-08 ENCOUNTER — Other Ambulatory Visit: Payer: Self-pay | Admitting: Family Medicine

## 2022-09-15 ENCOUNTER — Encounter: Payer: Self-pay | Admitting: Family Medicine

## 2022-09-15 ENCOUNTER — Telehealth: Payer: 59 | Admitting: Physician Assistant

## 2022-09-15 DIAGNOSIS — U071 COVID-19: Secondary | ICD-10-CM | POA: Diagnosis not present

## 2022-09-15 DIAGNOSIS — J439 Emphysema, unspecified: Secondary | ICD-10-CM

## 2022-09-15 MED ORDER — MOLNUPIRAVIR EUA 200MG CAPSULE: 4.0000 | ORAL_CAPSULE | Freq: Two times a day (BID) | ORAL | 0 refills | Status: DC

## 2022-09-15 MED ORDER — PREDNISONE 20 MG PO TABS: 20.0000 mg | ORAL_TABLET | Freq: Every day | ORAL | 0 refills | Status: DC

## 2022-09-15 NOTE — Progress Notes (Signed)
Virtual Visit Consent   Emily Ward, you are scheduled for a virtual visit with a [facility] provider today. Just as with appointments in the office, your consent must be obtained to participate. Your consent will be active for this visit and any virtual visit you may have with one of our providers in the next 365 days. If you have a MyChart account, a copy of this consent can be sent to you electronically.  As this is a virtual visit, video technology does not allow for your provider to perform a traditional examination. This may limit your provider's ability to fully assess your condition. If your provider identifies any concerns that need to be evaluated in person or the need to arrange testing (such as labs, EKG, etc.), we will make arrangements to do so. Although advances in technology are sophisticated, we cannot ensure that it will always work on either your end or our end. If the connection with a video visit is poor, the visit may have to be switched to a telephone visit. With either a video or telephone visit, we are not always able to ensure that we have a secure connection.  By engaging in this virtual visit, you consent to the provision of healthcare and authorize for your insurance to be billed (if applicable) for the services provided during this visit. Depending on your insurance coverage, you may receive a charge related to this service.  I need to obtain your verbal consent now. Are you willing to proceed with your visit today? Emily Ward has provided verbal consent on 09/15/2022 for a virtual visit (video or telephone). Inda Coke, [place]  Date: 09/15/2022 5:40 PM  Virtual Visit via Video Note   I, Inda Coke, connected with  Emily Ward  ([id number], 17-May-1956) on 09/15/22 at  7:00 PM EST by a video-enabled telemedicine application and verified that I am speaking with the correct person using two identifiers.  Location: Patient: Virtual  Visit Location Patient: Home Provider: Virtual Visit Location Provider: Home Office   I discussed the limitations of evaluation and management by telemedicine and the availability of in person appointments. The patient expressed understanding and agreed to proceed.    History of Present Illness: Emily Ward is a 67 y.o. who identifies as a female who was assigned female at birth, and is being seen today for COVID-19.  She tested positive for COVID  today. Sx started yesterday. She is having the following symptoms: fever 101.2, chills, aches, headache and post nasal drainage. Symptoms started last night.  She has COPD and has Trelegy Ellipta to use 1 puff daily, as well as prn ventolin inhaler and prn duonep nebulizer. Has not had to use her ventolin or nebulizer.  She has had at least 4 COVID vaccines.  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   Palpitations 07/30/2017   Shortness of breath 07/30/2017   Hormone replacement therapy (HRT) 03/30/2017   Former smoker 03/30/2017   Other fatigue 06/30/2016   Insomnia 06/30/2016   DJD (degenerative joint disease), cervical 06/30/2016   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/2016   IBS (irritable bowel syndrome) 06/30/2016  Interstitial cystitis 06/30/2016   SHOULDER PAIN, RIGHT 09/05/2010   GERD 03/24/2009   SACROILIAC STRAIN 03/24/2009   Atypical chest pain 01/20/2008   HEMATURIA UNSPECIFIED 10/14/2007   FIBROMYALGIA 07/03/2007   HYPERLIPIDEMIA NEC/NOS 05/16/2007   HEADACHE 05/09/2007    Allergies:  Allergies  Allergen Reactions    Divalproex Sodium     Drug inducted hepatitis Other reaction(s): Unknown Other reaction(s): Unknown   Nsaids     Upset stomach   Medications:  Current Outpatient Medications:    molnupiravir EUA (LAGEVRIO) 200 mg CAPS capsule, Take 4 capsules (800 mg total) by mouth 2 (two) times daily for 5 days., Disp: 40 capsule, Rfl: 0   predniSONE (DELTASONE) 20 MG tablet, Take 1 tablet (20 mg total) by mouth daily with breakfast., Disp: 5 tablet, Rfl: 0   albuterol (VENTOLIN HFA) 108 (90 Base) MCG/ACT inhaler, Inhale 1-2 puffs into the lungs every 6 (six) hours as needed for wheezing or shortness of breath., Disp: 18 g, Rfl: 2   butalbital-acetaminophen-caffeine (FIORICET) 50-325-40 MG tablet, TAKE 1 TABLET BY MOUTH EVERY 6 HOURS AS NEEDED, Disp: 120 tablet, Rfl: 5   CALCIUM PO, Take 1 tablet by mouth daily., Disp: , Rfl:    cetirizine (ZYRTEC) 10 MG tablet, Take 10 mg by mouth daily as needed for allergies (spring time). (Patient not taking: Reported on 07/05/2022), Disp: , Rfl:    chlorzoxazone (PARAFON) 500 MG tablet, TAKE 1 TABLET BY MOUTH FOUR TIMES DAILY AS NEEDED FOR MUSCLE SPASMS, Disp: 120 tablet, Rfl: 5   cholecalciferol (VITAMIN D) 1000 units tablet, Take 2,000 Units by mouth daily. , Disp: , Rfl:    Cyanocobalamin (VITAMIN B-12 PO), Take 1 tablet by mouth daily. , Disp: , Rfl:    cyclobenzaprine (FLEXERIL) 10 MG tablet, TAKE 1 TABLET BY MOUTH THREE TIMES DAILY AS NEEDED FOR MUSCLE SPASMS, Disp: 270 tablet, Rfl: 1   denosumab (PROLIA) 60 MG/ML SOSY injection, Inject 60 mg into the skin every 6 (six) months., Disp: , Rfl:    diazepam (VALIUM) 5 MG tablet, Take one tablet by mouth with food one hour prior to procedure. May repeat 30 minutes prior if needed., Disp: 2 tablet, Rfl: 0   estradiol (ESTRACE) 1 MG tablet, Take 1 tablet (1 mg total) by mouth daily., Disp: 90 tablet, Rfl: 4   Fluticasone-Umeclidin-Vilant (TRELEGY ELLIPTA) 100-62.5-25 MCG/ACT AEPB, INHALE 1 PUFF INTO THE LUNGS DAILY, Disp:  60 each, Rfl: 3   ipratropium-albuterol (DUONEB) 0.5-2.5 (3) MG/3ML SOLN, Take 3 mLs by nebulization every 6 (six) hours as needed., Disp: 360 mL, Rfl: 11   zolmitriptan (ZOMIG) 5 MG tablet, TAKE 1 TABLET BY MOUTH AS NEEDED AS DIRECTED, Disp: 30 tablet, Rfl: 1   zolpidem (AMBIEN) 10 MG tablet, TAKE 1 TABLET(10 MG) BY MOUTH AT BEDTIME, Disp: 30 tablet, Rfl: 5  Observations/Objective: Patient is well-developed, well-nourished in no acute distress.  Resting comfortably  at home.  Head is normocephalic, atraumatic.  No labored breathing.  Speech is clear and coherent with logical content.  Patient is alert and oriented at baseline.    Assessment and Plan: COVID-19; Pulmonary emphysema, unspecified emphysema type ([place]) No red flags on discussion. She is no severe distress. Will initiate molnupiravir and prednisone per orders.  Discussed taking medications as prescribed. Reviewed return precautions including worsening fever, SOB, worsening cough or other concerns. Push fluids and rest. I recommend that patient follow-up if symptoms worsen or persist despite treatment x 7-10 days, sooner if needed. Provided isolation guidelines and worsening precautions  on AVS.  Follow Up Instructions: I discussed the assessment and treatment plan with the patient. The patient was provided an opportunity to ask questions and all were answered. The patient agreed with the plan and demonstrated an understanding of the instructions.  A copy of instructions were sent to the patient via MyChart unless otherwise noted below.   The patient was advised to call back or seek an in-person evaluation if the symptoms worsen or if the condition fails to improve as anticipated.  Time:  I spent 5-10 minutes with the patient via telehealth technology discussing the above problems/concerns.    Inda Coke, [place]

## 2022-09-15 NOTE — Patient Instructions (Signed)
COVID or suspected COVID home recommendations  For current/suspected COVID symptoms: - Please watch closely for new onset shortness of breath, worsening shortness of breath, dizziness, confusion or any worsening symptoms. If any of these occur, please contact us during business hours, and if after business hours, please seek urgent care or go to the closest emergency room.  -Consider purchasing a pulse oximeter. If your levels are 94% or below persistently, please seek care at the hospital.   -If you test positive for COVID, everyone, regardless of vaccination status, should stay home for 5 days since symptom onset (or if asymptomatic, on day of positive test.) If you have no symptoms or your symptoms are resolving after 5 days, you can leave your house. Continue to wear a mask around others for 5 additional days. If you have a fever, continue to stay home until your fever resolves without use of medication.  -Please inform any contacts of your positive result so they can appropriately quarantine/test.  -Push fluids and try to eat small, frequent meals with protein to maintain your stamina.

## 2022-09-16 ENCOUNTER — Encounter: Payer: Self-pay | Admitting: Physician Assistant

## 2022-09-17 ENCOUNTER — Other Ambulatory Visit (HOSPITAL_COMMUNITY): Payer: Self-pay

## 2022-09-17 MED ORDER — MOLNUPIRAVIR EUA 200MG CAPSULE
4.0000 | ORAL_CAPSULE | Freq: Two times a day (BID) | ORAL | 0 refills | Status: AC
  Filled 2022-09-17: qty 40, 5d supply, fill #0

## 2022-09-17 NOTE — Telephone Encounter (Signed)
The pharmacy should be open today

## 2022-09-17 NOTE — Telephone Encounter (Signed)
Please see message and send to [facility].

## 2022-09-17 NOTE — Addendum Note (Signed)
Addended by: Brunetta Jeans on: 09/17/2022 10:01 AM   Modules accepted: Orders

## 2022-09-19 ENCOUNTER — Encounter: Payer: 59 | Admitting: Family Medicine

## 2022-09-28 ENCOUNTER — Ambulatory Visit: Payer: 59 | Admitting: Pulmonary Disease

## 2022-10-03 ENCOUNTER — Encounter: Payer: Self-pay | Admitting: Family Medicine

## 2022-10-03 ENCOUNTER — Ambulatory Visit (INDEPENDENT_AMBULATORY_CARE_PROVIDER_SITE_OTHER): Payer: 59 | Admitting: Family Medicine

## 2022-10-03 VITALS — BP 102/76 | HR 94 | Temp 98.3°F | Ht 65.5 in | Wt 106.8 lb

## 2022-10-03 DIAGNOSIS — Z23 Encounter for immunization: Secondary | ICD-10-CM

## 2022-10-03 DIAGNOSIS — Z0000 Encounter for general adult medical examination without abnormal findings: Secondary | ICD-10-CM | POA: Diagnosis not present

## 2022-10-03 LAB — URINALYSIS, ROUTINE W REFLEX MICROSCOPIC
Bilirubin Urine: NEGATIVE
Hgb urine dipstick: NEGATIVE
Ketones, ur: NEGATIVE
Nitrite: NEGATIVE
Specific Gravity, Urine: 1.015 (ref 1.000–1.030)
Total Protein, Urine: NEGATIVE
Urine Glucose: NEGATIVE
Urobilinogen, UA: 0.2 (ref 0.0–1.0)
pH: 6 (ref 5.0–8.0)

## 2022-10-03 LAB — BASIC METABOLIC PANEL
BUN: 14 mg/dL (ref 6–23)
CO2: 29 mEq/L (ref 19–32)
Calcium: 10.1 mg/dL (ref 8.4–10.5)
Chloride: 102 mEq/L (ref 96–112)
Creatinine, Ser: 0.63 mg/dL (ref 0.40–1.20)
GFR: 93.95 mL/min (ref >60.00)
Glucose, Bld: 93 mg/dL (ref 70–99)
Potassium: 3.9 mEq/L (ref 3.5–5.1)
Sodium: 139 mEq/L (ref 135–145)

## 2022-10-03 LAB — LIPID PANEL
Cholesterol: 265 mg/dL — ABNORMAL HIGH (ref 0–200)
HDL: 82 mg/dL (ref >39.00)
LDL Cholesterol: 167 mg/dL — ABNORMAL HIGH (ref 0–99)
NonHDL: 182.78
Total CHOL/HDL Ratio: 3
Triglycerides: 78 mg/dL (ref 0.0–149.0)
VLDL: 15.6 mg/dL (ref 0.0–40.0)

## 2022-10-03 LAB — HEPATIC FUNCTION PANEL
ALT: 7 U/L (ref 0–35)
AST: 17 U/L (ref 0–37)
Albumin: 4.6 g/dL (ref 3.5–5.2)
Alkaline Phosphatase: 60 U/L (ref 39–117)
Bilirubin, Direct: 0.1 mg/dL (ref 0.0–0.3)
Total Bilirubin: 0.4 mg/dL (ref 0.2–1.2)
Total Protein: 7.3 g/dL (ref 6.0–8.3)

## 2022-10-03 LAB — TSH: TSH: 1.37 u[IU]/mL (ref 0.35–5.50)

## 2022-10-03 LAB — HEMOGLOBIN A1C: Hgb A1c MFr Bld: 5.5 % (ref 4.6–6.5)

## 2022-10-03 MED ORDER — PHENAZOPYRIDINE HCL 200 MG PO TABS: 200.0000 mg | ORAL_TABLET | Freq: Three times a day (TID) | ORAL | 1 refills | Status: DC | PRN

## 2022-10-03 NOTE — Addendum Note (Signed)
Addended by: Wyvonne Lenz on: 10/03/2022 01:53 PM   Modules accepted: Orders

## 2022-10-03 NOTE — Progress Notes (Signed)
   Subjective:    Patient ID: Emily Ward, female    DOB: November 30, 1955, 67 y.o.   MRN: [id number]  HPI Here for a well exam. She has been doing well except for several UTI's in the past year. She usually goes to a [facility] urgent care for these.    Review of Systems  Constitutional: Negative.   HENT: Negative.    Eyes: Negative.   Respiratory: Negative.    Cardiovascular: Negative.   Gastrointestinal: Negative.   Genitourinary:  Negative for decreased urine volume, difficulty urinating, dyspareunia, dysuria, enuresis, flank pain, frequency, hematuria, pelvic pain and urgency.  Musculoskeletal:  Positive for back pain.  Skin: Negative.   Neurological: Negative.  Negative for headaches.  Psychiatric/Behavioral: Negative.         Objective:   Physical Exam Constitutional:      General: She is not in acute distress.    Appearance: Normal appearance. She is well-developed.  HENT:     Head: Normocephalic and atraumatic.     Right Ear: External ear normal.     Left Ear: External ear normal.     Nose: Nose normal.     Mouth/Throat:     Pharynx: No oropharyngeal exudate.  Eyes:     General: No scleral icterus.    Conjunctiva/sclera: Conjunctivae normal.     Pupils: Pupils are equal, round, and reactive to light.  Neck:     Thyroid: No thyromegaly.     Vascular: No JVD.  Cardiovascular:     Rate and Rhythm: Normal rate and regular rhythm.     Heart sounds: Normal heart sounds. No murmur heard.    No friction rub. No gallop.  Pulmonary:     Effort: Pulmonary effort is normal. No respiratory distress.     Breath sounds: Normal breath sounds. No wheezing or rales.  Chest:     Chest wall: No tenderness.  Abdominal:     General: Bowel sounds are normal. There is no distension.     Palpations: Abdomen is soft. There is no mass.     Tenderness: There is no abdominal tenderness. There is no guarding or rebound.  Musculoskeletal:        General: No tenderness. Normal range  of motion.     Cervical back: Normal range of motion and neck supple.  Lymphadenopathy:     Cervical: No cervical adenopathy.  Skin:    General: Skin is warm and dry.     Findings: No erythema or rash.  Neurological:     Mental Status: She is alert and oriented to person, place, and time.     Cranial Nerves: No cranial nerve deficit.     Motor: No abnormal muscle tone.     Coordination: Coordination normal.     Deep Tendon Reflexes: Reflexes are normal and symmetric. Reflexes normal.  Psychiatric:        Behavior: Behavior normal.        Thought Content: Thought content normal.        Judgment: Judgment normal.           Assessment & Plan:  Well exam. We discussed diet and exercise. Get fasting labs. Alysia Penna, MD

## 2022-10-04 ENCOUNTER — Encounter: Payer: Self-pay | Admitting: Family Medicine

## 2022-10-04 DIAGNOSIS — N390 Urinary tract infection, site not specified: Secondary | ICD-10-CM

## 2022-10-04 DIAGNOSIS — N3010 Interstitial cystitis (chronic) without hematuria: Secondary | ICD-10-CM

## 2022-10-04 NOTE — Telephone Encounter (Signed)
I did the referral 

## 2022-10-05 ENCOUNTER — Other Ambulatory Visit: Payer: Self-pay

## 2022-10-05 DIAGNOSIS — E785 Hyperlipidemia, unspecified: Secondary | ICD-10-CM

## 2022-10-05 MED ORDER — ATORVASTATIN CALCIUM 10 MG PO TABS: 10.0000 mg | ORAL_TABLET | Freq: Every day | ORAL | 3 refills | Status: DC

## 2022-10-05 MED ORDER — CIPROFLOXACIN HCL 500 MG PO TABS: 500.0000 mg | ORAL_TABLET | Freq: Two times a day (BID) | ORAL | 0 refills | Status: DC

## 2022-10-10 ENCOUNTER — Encounter: Payer: Self-pay | Admitting: Family Medicine

## 2022-10-10 ENCOUNTER — Ambulatory Visit: Payer: 59 | Admitting: Family Medicine

## 2022-10-10 VITALS — BP 98/70 | HR 91 | Temp 98.3°F | Wt 108.2 lb

## 2022-10-10 DIAGNOSIS — R300 Dysuria: Secondary | ICD-10-CM | POA: Diagnosis not present

## 2022-10-10 DIAGNOSIS — N390 Urinary tract infection, site not specified: Secondary | ICD-10-CM

## 2022-10-10 LAB — POC URINALSYSI DIPSTICK (AUTOMATED)
Bilirubin, UA: NEGATIVE
Blood, UA: NEGATIVE
Glucose, UA: NEGATIVE
Ketones, UA: NEGATIVE
Nitrite, UA: NEGATIVE
Protein, UA: NEGATIVE
Spec Grav, UA: 1.015
Urobilinogen, UA: 0.2 U/dL
pH, UA: 5

## 2022-10-10 MED ORDER — DOXYCYCLINE HYCLATE 100 MG PO CAPS: 100.0000 mg | ORAL_CAPSULE | Freq: Two times a day (BID) | ORAL | 0 refills | Status: DC

## 2022-10-10 NOTE — Progress Notes (Signed)
   Subjective:    Patient ID: Emily Ward, female    DOB: 17-Aug-1956, 67 y.o.   MRN: [id number]  HPI Here for continued UTI symptoms like urgency, burning, and low back pain. No fever.  She saw a [facility] urgent clinic a few weeks ago for these same symptoms, and she was given 5 days of Nitrofurantoin. Her culture grew Enterococcus faecalis that was pan-sensitive. Then she saw [place] for a well exam on 10-03-22, and we gave her a 7 day course Cipro. Now she still has the same symptoms, and her UA is still positive for bacteria.   Review of Systems  Constitutional: Negative.   Respiratory: Negative.    Cardiovascular: Negative.   Gastrointestinal: Negative.   Genitourinary:  Positive for dysuria, frequency and urgency. Negative for flank pain and hematuria.  Musculoskeletal:  Positive for back pain.       Objective:   Physical Exam Constitutional:      Appearance: Normal appearance. She is not ill-appearing.  Cardiovascular:     Rate and Rhythm: Normal rate and regular rhythm.     Pulses: Normal pulses.     Heart sounds: Normal heart sounds.  Pulmonary:     Effort: Pulmonary effort is normal.     Breath sounds: Normal breath sounds.  Abdominal:     Tenderness: There is no right CVA tenderness or left CVA tenderness.  Neurological:     Mental Status: She is alert.           Assessment & Plan:  She has a resistant UTI, so we will treat her with 10 days of Doxycycline. She is scheduled to see Urology on March 6.  Alysia Penna, MD

## 2022-10-11 ENCOUNTER — Other Ambulatory Visit: Payer: Self-pay | Admitting: Pharmacy Technician

## 2022-10-12 ENCOUNTER — Encounter: Payer: Self-pay | Admitting: Family Medicine

## 2022-10-15 ENCOUNTER — Telehealth: Payer: Self-pay | Admitting: Pharmacy Technician

## 2022-10-15 ENCOUNTER — Other Ambulatory Visit: Payer: Self-pay

## 2022-10-15 DIAGNOSIS — N390 Urinary tract infection, site not specified: Secondary | ICD-10-CM

## 2022-10-15 DIAGNOSIS — N3010 Interstitial cystitis (chronic) without hematuria: Secondary | ICD-10-CM

## 2022-10-15 MED ORDER — CIPROFLOXACIN HCL 500 MG PO TABS: 500.0000 mg | ORAL_TABLET | Freq: Two times a day (BID) | ORAL | 0 refills | Status: AC

## 2022-10-15 NOTE — Telephone Encounter (Signed)
Auth Submission: APPROVED Payer: [employer] Medication & CPT/J Code(s) submitted: Prolia (Denosumab) [id number] Route of submission (phone, fax, portal): PORTAL Phone # Fax # Auth type: Buy/Bill Units/visits requested: X2 Reference number: [id number] Approval from: 10/15/22 to 10/16/23

## 2022-10-15 NOTE — Telephone Encounter (Signed)
Cancel the Doxycycline, and call in Cipro 500 mg BID for 10 days

## 2022-10-16 ENCOUNTER — Other Ambulatory Visit: Payer: 59

## 2022-10-18 ENCOUNTER — Encounter (HOSPITAL_BASED_OUTPATIENT_CLINIC_OR_DEPARTMENT_OTHER): Payer: Self-pay | Admitting: Obstetrics & Gynecology

## 2022-10-19 ENCOUNTER — Other Ambulatory Visit: Payer: Self-pay | Admitting: Family Medicine

## 2022-10-22 ENCOUNTER — Encounter: Payer: Self-pay | Admitting: *Deleted

## 2022-10-22 ENCOUNTER — Ambulatory Visit (HOSPITAL_BASED_OUTPATIENT_CLINIC_OR_DEPARTMENT_OTHER): Payer: 59 | Admitting: Obstetrics & Gynecology

## 2022-10-22 NOTE — Telephone Encounter (Signed)
Last OV-10/10/2022 Last refill-03/28/22--30 tabs, 5 refills  No future OV scheduled.

## 2022-10-23 ENCOUNTER — Ambulatory Visit: Payer: 59 | Admitting: Rheumatology

## 2022-10-24 HISTORY — PX: KNEE ARTHROSCOPY: SHX127

## 2022-10-30 ENCOUNTER — Other Ambulatory Visit (HOSPITAL_BASED_OUTPATIENT_CLINIC_OR_DEPARTMENT_OTHER): Payer: Self-pay | Admitting: Obstetrics & Gynecology

## 2022-10-30 DIAGNOSIS — Z9229 Personal history of other drug therapy: Secondary | ICD-10-CM

## 2022-11-03 ENCOUNTER — Other Ambulatory Visit: Payer: Self-pay | Admitting: Pulmonary Disease

## 2022-11-12 ENCOUNTER — Encounter: Payer: Self-pay | Admitting: Family Medicine

## 2022-11-12 ENCOUNTER — Ambulatory Visit: Payer: 59 | Admitting: Family Medicine

## 2022-11-12 VITALS — BP 106/70 | HR 97 | Temp 98.2°F | Wt 106.2 lb

## 2022-11-12 DIAGNOSIS — R350 Frequency of micturition: Secondary | ICD-10-CM

## 2022-11-12 DIAGNOSIS — K581 Irritable bowel syndrome with constipation: Secondary | ICD-10-CM | POA: Diagnosis not present

## 2022-11-12 LAB — POCT URINALYSIS DIPSTICK
Bilirubin, UA: NEGATIVE
Blood, UA: NEGATIVE
Glucose, UA: NEGATIVE
Ketones, UA: NEGATIVE
Nitrite, UA: NEGATIVE
Protein, UA: NEGATIVE
Spec Grav, UA: 1.01 (ref 1.010–1.025)
Urobilinogen, UA: 0.2 E.U./dL
pH, UA: 5 (ref 5.0–8.0)

## 2022-11-12 NOTE — Addendum Note (Signed)
Addended by: Wyvonne Lenz on: 11/12/2022 05:19 PM   Modules accepted: Orders

## 2022-11-12 NOTE — Progress Notes (Signed)
   Subjective:    Patient ID: Emily Ward, female    DOB: 1956/01/12, 67 y.o.   MRN: [id number]  HPI Here with questions about her bowels. She was diagnosed with an Enterococcal UTI in January, and in the weeks after that she was treated with 3 consecutive rounds of antibiotics (Macrobid, then Cipro, then Doxycycline). It seems like the UTI is finally gone, but in the process she began to have a number of GI issues. She has been constipated at times, and she has tried a number of things to teat this. Miralax did not help. She is now using a combination of Colace and prune juice, and this is helping better. She also feels bloating and cramping that comes and goes. Sometimes she has nausea as well, but she has not vomited. No fevers. No recent travel. Her last colonoscopy in November per Dr. Lorenso Courier showed some polyps and diverticulosis, but no signs of a colitis. She is a vegan and avoids all meats and dairy products.    Review of Systems  Constitutional: Negative.   Respiratory: Negative.    Cardiovascular: Negative.   Gastrointestinal:  Positive for abdominal distention, abdominal pain, constipation and nausea. Negative for anal bleeding, blood in stool, diarrhea, rectal pain and vomiting.  Genitourinary: Negative.        Objective:   Physical Exam Constitutional:      Appearance: Normal appearance.  Cardiovascular:     Rate and Rhythm: Normal rate and regular rhythm.     Pulses: Normal pulses.     Heart sounds: Normal heart sounds.  Pulmonary:     Effort: Pulmonary effort is normal.     Breath sounds: Normal breath sounds.  Abdominal:     General: Abdomen is flat. Bowel sounds are normal. There is no distension.     Palpations: Abdomen is soft. There is no mass.     Tenderness: There is no abdominal tenderness. There is no guarding or rebound.     Hernia: No hernia is present.  Neurological:     Mental Status: She is alert.           Assessment & Plan:  Her  recent GI symptoms are most likely due to IBS, and this was likely precipitated by all the antibiotics she has been taking. I advised her to stay on the Colace and prune juice, and she should also begin taking a daily probiotic. She is scheduled to see the GI clinic on 12-20-22.  Alysia Penna, MD

## 2022-11-13 ENCOUNTER — Ambulatory Visit
Admission: RE | Admit: 2022-11-13 | Discharge: 2022-11-13 | Disposition: A | Discharge status: Home / Self Care | Payer: 59 | Source: Ambulatory Visit | Attending: Pulmonary Disease | Admitting: Pulmonary Disease

## 2022-11-13 DIAGNOSIS — J439 Emphysema, unspecified: Secondary | ICD-10-CM

## 2022-11-15 ENCOUNTER — Other Ambulatory Visit: Payer: Self-pay | Admitting: Family Medicine

## 2022-11-16 NOTE — Telephone Encounter (Signed)
Last OV-11/12/22 No future OV scheduled.

## 2022-12-01 ENCOUNTER — Other Ambulatory Visit: Payer: Self-pay | Admitting: Family Medicine

## 2022-12-11 ENCOUNTER — Ambulatory Visit (INDEPENDENT_AMBULATORY_CARE_PROVIDER_SITE_OTHER): Payer: 59 | Admitting: Obstetrics & Gynecology

## 2022-12-11 ENCOUNTER — Encounter (HOSPITAL_BASED_OUTPATIENT_CLINIC_OR_DEPARTMENT_OTHER): Payer: Self-pay | Admitting: Obstetrics & Gynecology

## 2022-12-11 VITALS — BP 99/75 | HR 90 | Ht 65.5 in | Wt 106.6 lb

## 2022-12-11 DIAGNOSIS — N390 Urinary tract infection, site not specified: Secondary | ICD-10-CM

## 2022-12-11 DIAGNOSIS — N393 Stress incontinence (female) (male): Secondary | ICD-10-CM | POA: Diagnosis not present

## 2022-12-11 DIAGNOSIS — Z803 Family history of malignant neoplasm of breast: Secondary | ICD-10-CM

## 2022-12-11 DIAGNOSIS — B3731 Acute candidiasis of vulva and vagina: Secondary | ICD-10-CM

## 2022-12-11 DIAGNOSIS — Z9229 Personal history of other drug therapy: Secondary | ICD-10-CM | POA: Diagnosis not present

## 2022-12-11 DIAGNOSIS — Z01419 Encounter for gynecological examination (general) (routine) without abnormal findings: Secondary | ICD-10-CM | POA: Diagnosis not present

## 2022-12-11 MED ORDER — ESTRADIOL 1 MG PO TABS: 1.0000 mg | ORAL_TABLET | Freq: Every day | ORAL | 3 refills | Status: DC

## 2022-12-11 MED ORDER — TERCONAZOLE 0.4 % VA CREA: 1.0000 | TOPICAL_CREAM | Freq: Every day | VAGINAL | 0 refills | Status: DC

## 2022-12-11 NOTE — Progress Notes (Signed)
67 y.o. G2P2 Married White or Caucasian female here for annual exam.  Had colonoscopy in November.  Had really significant constipation after this as well as recurrent UTIs.  Had several different bacteria that were present with UTI symptoms.  Did end up seeing a urologist for the recurrent UTIs.  Several suggestions were made including estrace vaginal cream.  Hasn't really started this with any regularity.  About three weeks ago, she felt like she was starting with another UTI.  She was given antibiotics.  She didn't take it and symptoms got better.  She reports urine culture was negative.  Will be seeing Arnette Norris for pelvic PT.  Having stress incontinence as well.    Denies vaginal bleeding.   H/o hysterectomy.  On estradiol 1.0mg  estradiol.  Advised pt is it ok to see the estradiol cream as well.  Patient's last menstrual period was 09/03/2000 (approximate).          Sexually active: Yes.    The current method of family planning is status post hysterectomy.    Smoker:  no  Health Maintenance: Pap:  07/15/2018 Negative History of abnormal Pap:  remote hx MMG:  10/24/2021.  Did did have follow up imaging.  Appt scheduled in May. Colonoscopy:  07/19/2022 BMD:   10/22/2020 Osteoporosis Screening Labs: does with Dr. Clent Ridges   reports that she quit smoking about 9 years ago. Her smoking use included cigarettes. She has a 20.00 pack-year smoking history. She has never been exposed to tobacco smoke. She has never used smokeless tobacco. She reports current alcohol use of about 1.0 standard drink of alcohol per week. She reports that she does not use drugs.  Past Medical History:  Diagnosis Date   Cervical disc disease 2006   COPD (chronic obstructive pulmonary disease)    Emphysema lung    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     Fibromyalgia    sees Dr. 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 Norwood Levo, adenmatous polyp, repeat in 6 months   CYSTOSCOPY N/A 09/01/2018   Procedure: CYSTOSCOPY;  Surgeon: Jerene Bears, MD;  Location: [facility];  Service: Gynecology;  Laterality: N/A;   DILATATION & CURETTAGE/HYSTEROSCOPY WITH MYOSURE N/A 04/12/2017   Procedure: DILATATION & CURETTAGE/HYSTEROSCOPY;  Surgeon: Jerene Bears, MD;  Location: [facility];  Service: Gynecology;  Laterality: N/A;   epidural thoracic spine injection  06/07/2022   facet joint block  03/30/2021   per patient, performed by Dr. Alvester Morin   facet joint block  01/31/2022   per patient, performed by Dr. Alvester Morin   HYSTEROSCOPY     years ago with Dr Arelia Sneddon-? 2000   TOTAL LAPAROSCOPIC HYSTERECTOMY WITH SALPINGECTOMY Bilateral 09/01/2018   Procedure: TOTAL LAPAROSCOPIC HYSTERECTOMY WITH SALPINGECTOMY  BSO, REPAIR OF PERINEAL LACERATION;  Surgeon: Jerene Bears, MD;  Location: [facility];  Service: Gynecology;  Laterality: Bilateral;  possible BSO   URETHRAL DILATION      Current Outpatient  Medications  Medication Sig Dispense Refill   albuterol (VENTOLIN HFA) 108 (90 Base) MCG/ACT inhaler Inhale 1-2 puffs into the lungs every 6 (six) hours as needed for wheezing or shortness of breath. 18 g 2   atorvastatin (LIPITOR) 10 MG tablet Take 1 tablet (10 mg total) by mouth daily. 90 tablet 3   butalbital-acetaminophen-caffeine (FIORICET) 50-325-40 MG tablet TAKE 1 TABLET BY MOUTH EVERY 6 HOURS AS NEEDED 120 tablet 5   CALCIUM PO Take 1 tablet by mouth daily.     cetirizine (ZYRTEC) 10 MG tablet Take 10 mg by mouth daily as needed for allergies (spring time).     chlorzoxazone (PARAFON) 500  MG tablet TAKE 1 TABLET BY MOUTH FOUR TIMES DAILY AS NEEDED FOR MUSCLE SPASMS 120 tablet 5   cholecalciferol (VITAMIN D) 1000 units tablet Take 2,000 Units by mouth daily.      Cyanocobalamin (VITAMIN B-12 PO) Take 1 tablet by mouth daily.      cyclobenzaprine (FLEXERIL) 10 MG tablet TAKE 1 TABLET BY MOUTH THREE TIMES DAILY AS NEEDED FOR MUSCLE SPASMS 270 tablet 1   denosumab (PROLIA) 60 MG/ML SOSY injection Inject 60 mg into the skin every 6 (six) months.     estradiol (ESTRACE) 1 MG tablet TAKE 1 TABLET(1 MG) BY MOUTH DAILY 90 tablet 0   Fluticasone-Umeclidin-Vilant (TRELEGY ELLIPTA) 100-62.5-25 MCG/ACT AEPB INHALE 1 PUFF INTO THE LUNGS DAILY 60 each 2   ipratropium-albuterol (DUONEB) 0.5-2.5 (3) MG/3ML SOLN Take 3 mLs by nebulization every 6 (six) hours as needed. 360 mL 11   phenazopyridine (PYRIDIUM) 200 MG tablet Take 1 tablet (200 mg total) by mouth 3 (three) times daily as needed (urinary pain). 60 tablet 1   zolmitriptan (ZOMIG) 5 MG tablet TAKE 1 TABLET BY MOUTH AS NEEDED AS DIRECTED 30 tablet 1   zolpidem (AMBIEN) 10 MG tablet TAKE 1 TABLET(10 MG) BY MOUTH AT BEDTIME 30 tablet 5   No current facility-administered medications for this visit.    Family History  Problem Relation Age of Onset   Fibrocystic breast disease Mother        lumpectomy in 50-60   Heart attack Father    Congestive Heart Failure Father    Diabetes type II Sister        and ? brother   Arthritis Son        psoriatic   Cancer Other        breast/fhx   Heart disease Other        fhx   Colon cancer Neg Hx    Esophageal cancer Neg Hx    Rectal cancer Neg Hx    Stomach cancer Neg Hx    Colon polyps Neg Hx     ROS: Constitutional: negative Genitourinary: recent issues with recurrent UTIs  Exam:   BP 99/75 (BP Location: Right Arm, Patient Position: Sitting, Cuff Size: Normal)   Pulse 90   Ht 5' 5.5" (1.664 m) Comment: Reported  Wt 106 lb 9.6 oz (48.4 kg)   LMP 09/03/2000 (Approximate)   BMI 17.47  kg/m   Height: 5' 5.5" (166.4 cm) (Reported)  General appearance: alert, cooperative and appears stated age Head: Normocephalic, without obvious abnormality, atraumatic Neck: no adenopathy, supple, symmetrical, trachea midline and thyroid normal to inspection and palpation Lungs: clear to auscultation bilaterally Breasts: normal appearance, no masses or tenderness Heart: regular rate and rhythm Abdomen: soft, non-tender; bowel sounds normal; no masses,  no organomegaly Extremities: extremities normal, atraumatic, no cyanosis or edema Skin: Skin  color, texture, turgor normal. No rashes or lesions Lymph nodes: Cervical, supraclavicular, and axillary nodes normal. No abnormal inguinal nodes palpated Neurologic: Grossly normal   Pelvic: External genitalia:  no lesions              Urethra:  normal appearing urethra with no masses, tenderness or lesions              Bartholins and Skenes: normal                 Vagina: erythematous mucosa, white clumpy discharge present c/w yeast              Cervix: absent              Pap taken: No. Bimanual Exam:  Uterus:  uterus absent              Adnexa: no mass, fullness, tenderness               Rectovaginal: Confirms               Anus:  normal sphincter tone, no lesions  Chaperone, Ina Homesonya Avalon, CMA, was present for exam.  Assessment/Plan: 1. Well woman exam with routine gynecological exam - Pap smear not indicated - Mammogram 10/2021.  Scheduled for May. - Colonoscopy 07/2022.  Follow up 6 months.   - Bone mineral density followed by Dr. Talmage NapBalan - lab work done with PCP, Dr. Clent RidgesFry - vaccines reviewed/updated  2. History of postmenopausal HRT - estradiol (ESTRACE) 1 MG tablet; Take 1 tablet (1 mg total) by mouth daily.  Dispense: 90 tablet; Refill: 3  3. Recurrent UTI - followed by Dr. Marlou PorchHerrick at [facility]  4. Stress incontinence of urine - is going to return to pelvic PT again with Arnette Norrisheryl Grey  5. Family history of breast  cancer  6.  Yeast vaginitis - rx for terazol 7, one applicator full nightly x 7 nights

## 2022-12-19 ENCOUNTER — Ambulatory Visit (HOSPITAL_BASED_OUTPATIENT_CLINIC_OR_DEPARTMENT_OTHER): Payer: 59 | Admitting: Obstetrics & Gynecology

## 2022-12-19 NOTE — Progress Notes (Signed)
Office Visit Note  Patient: Emily Ward             Date of Birth: 11/11/1955           MRN: [id number]             PCP: Nelwyn Salisbury, MD Referring: Nelwyn Salisbury, MD Visit Date: 01/01/2023 Occupation: @GUAROCC @  Subjective:  Left knee pain  History of Present Illness: Emily Ward is a 67 y.o. female with history of osteoarthritis, degenerative disc disease and osteoporosis.  She underwent left knee joint arthroscopic surgery for meniscal tear repair in February 2024 Dr. Devonne Doughty.  She is gradually recovering from it.  She has been doing exercises at home.  She continues to have some discomfort in the trapezius region in the thoracic region.  She has not had any more cortisone injections.  She has been going to integrative therapies which has been helpful.  She continues to be on Prolia for osteoporosis.  She will be getting DEXA scan through Dr. Hyacinth Meeker.    Activities of Daily Living:  Patient reports morning stiffness for 0 minutes.   Patient Denies nocturnal pain.  Difficulty dressing/grooming: Denies Difficulty climbing stairs: Denies Difficulty getting out of chair: Denies Difficulty using hands for taps, buttons, cutlery, and/or writing: Reports  Review of Systems  Constitutional:  Positive for fatigue.  HENT:  Positive for mouth dryness. Negative for mouth sores.   Eyes:  Positive for dryness.  Respiratory:  Negative for shortness of breath.   Cardiovascular:  Negative for chest pain and palpitations.  Gastrointestinal:  Positive for constipation. Negative for blood in stool and diarrhea.  Endocrine: Negative for increased urination.  Genitourinary:  Positive for involuntary urination.  Musculoskeletal:  Positive for joint pain, joint pain, myalgias, muscle weakness, muscle tenderness and myalgias. Negative for gait problem, joint swelling and morning stiffness.  Skin:  Negative for color change, rash, hair loss and sensitivity to sunlight.   Allergic/Immunologic: Negative for susceptible to infections.  Neurological:  Negative for dizziness and headaches.  Hematological:  Negative for swollen glands.  Psychiatric/Behavioral:  Negative for depressed mood and sleep disturbance. The patient is not nervous/anxious.     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   Palpitations 07/30/2017   Shortness of breath 07/30/2017   Hormone replacement therapy (HRT) 03/30/2017   Former smoker 03/30/2017   Other fatigue 06/30/2016   Insomnia 06/30/2016   DJD (degenerative joint disease), cervical 06/30/2016   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/2016   IBS (irritable bowel syndrome) 06/30/2016   Interstitial cystitis 06/30/2016   SHOULDER PAIN, RIGHT 09/05/2010   GERD 03/24/2009   SACROILIAC STRAIN 03/24/2009   Atypical chest pain 01/20/2008   HEMATURIA UNSPECIFIED 10/14/2007   FIBROMYALGIA 07/03/2007   HYPERLIPIDEMIA NEC/NOS 05/16/2007   HEADACHE 05/09/2007    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     Fibromyalgia    sees Dr. 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 brother   Colon  cancer Maternal Grandmother    Arthritis Son        psoriatic   Cancer Other        breast/fhx   Heart disease Other        fhx   Esophageal cancer Neg Hx    Rectal cancer Neg Hx    Stomach cancer Neg Hx    Colon polyps Neg Hx    Past Surgical History:  Procedure Laterality Date   CERVICAL LAMINECTOMY  2006   CESAREAN SECTION  1993   CHOLECYSTECTOMY  06/2002   COLONOSCOPY  07/19/2022   per Dr. Norwood Levo, adenmatous polyp, repeat in 6 months   CYSTOSCOPY N/A 09/01/2018   Procedure: CYSTOSCOPY;  Surgeon: Jerene Bears, MD;  Location: [facility];  Service: Gynecology;  Laterality: N/A;   DILATATION & CURETTAGE/HYSTEROSCOPY WITH MYOSURE N/A 04/12/2017   Procedure: DILATATION & CURETTAGE/HYSTEROSCOPY;  Surgeon: Jerene Bears, MD;  Location: [facility];  Service: Gynecology;  Laterality: N/A;   epidural thoracic spine injection  06/07/2022   facet joint block  03/30/2021   per patient, performed by Dr. Alvester Morin   facet joint block  01/31/2022   per patient, performed by Dr. Alvester Morin   HYSTEROSCOPY     years ago with Dr Arelia Sneddon-? 2000   KNEE ARTHROSCOPY Left 10/24/2022   TOTAL LAPAROSCOPIC HYSTERECTOMY WITH SALPINGECTOMY Bilateral 09/01/2018   Procedure: TOTAL LAPAROSCOPIC HYSTERECTOMY WITH SALPINGECTOMY  BSO, REPAIR OF PERINEAL LACERATION;  Surgeon: Jerene Bears, MD;  Location: [facility];  Service: Gynecology;  Laterality: Bilateral;  possible BSO   URETHRAL DILATION     Social History   Social History Narrative   Not on file   Immunization History  Administered Date(s) Administered   Influenza Whole 06/30/2007, 05/04/2010   Influenza,inj,Quad PF,6+ Mos 07/12/2017, 05/23/2018, 09/08/2021   Influenza-Unspecified 06/02/2014, 06/03/2016, 06/20/2020   MMR 06/30/2007   Moderna Sars-Covid-2 Vaccination 09/07/2019, 10/12/2019, 07/13/2020, 02/21/2021   PNEUMOCOCCAL CONJUGATE-20 10/03/2022   Pneumococcal Polysaccharide-23 03/23/2019    Td 09/03/1998, 05/05/2009   Tdap 10/22/2018     Objective: Vital Signs: BP 105/69 (BP Location: Left Arm, Patient Position: Sitting, Cuff Size: Normal)   Pulse 85   Resp 15   Ht 5' 4.75" (1.645 m)   Wt 104 lb 12.8 oz (47.5 kg)   LMP 09/03/2000 (Approximate)   BMI 17.57 kg/m    Physical Exam Vitals and nursing note reviewed.  Constitutional:      Appearance: She is well-developed.  HENT:     Head: Normocephalic and atraumatic.  Eyes:     Conjunctiva/sclera: Conjunctivae normal.  Cardiovascular:     Rate and Rhythm: Normal rate and regular rhythm.     Heart sounds: Normal heart sounds.  Pulmonary:     Effort: Pulmonary effort is normal.     Breath sounds: Normal breath sounds.  Abdominal:     General: Bowel sounds are normal.     Palpations: Abdomen is soft.  Musculoskeletal:  Cervical back: Normal range of motion.  Lymphadenopathy:     Cervical: No cervical adenopathy.  Skin:    General: Skin is warm and dry.     Capillary Refill: Capillary refill takes less than 2 seconds.  Neurological:     Mental Status: She is alert and oriented to person, place, and time.  Psychiatric:        Behavior: Behavior normal.      Musculoskeletal Exam: Cervical, thoracic and lumbar spine were in good range of motion.  Shoulder joints, elbow joints, wrist joints, MCPs PIPs and DIPs were in good range of motion.  She had bilateral CMC, PIP and DIP thickening.  Hip joints and knee joints in good range of motion.  She has warmth swelling and effusion in the left knee joint.  Surgical scars from arthroscopic surgery were noted.  Ankles with good range of motion.  She had no tenderness over ankles or MTPs.  CDAI Exam: CDAI Score: -- Patient Global: --; Provider Global: -- Swollen: --; Tender: -- Joint Exam 01/01/2023   No joint exam has been documented for this visit   There is currently no information documented on the homunculus. Go to the Rheumatology activity and complete the  homunculus joint exam.  Investigation: No additional findings.  Imaging: No results found.  Recent Labs: Lab Results  Component Value Date   WBC 5.0 01/01/2022   HGB 14.0 01/01/2022   PLT 204 01/01/2022   NA 139 10/03/2022   K 3.9 10/03/2022   CL 102 10/03/2022   CO2 29 10/03/2022   GLUCOSE 93 10/03/2022   BUN 14 10/03/2022   CREATININE 0.63 10/03/2022   BILITOT 0.4 10/03/2022   ALKPHOS 60 10/03/2022   AST 17 10/03/2022   ALT 7 10/03/2022   PROT 7.3 10/03/2022   ALBUMIN 4.6 10/03/2022   CALCIUM 10.1 10/03/2022   GFRAA >60 10/22/2018    Speciality Comments: Prolia started 2019.  Last injection July 31, 2021.  Patient wants to switch Prolia injections to our office.  Procedures:  No procedures performed Allergies: Divalproex sodium and Nsaids   Assessment / Plan:     Visit Diagnoses: Primary osteoarthritis of both hands -she had no synovitis on the examination.  Bilateral CMC PIP and DIP thickening was noted.  She continues to have discomfort over the right [facility] joint.  Use of right CMC brace was advised.  X-rays of both hands were obtained on 02/09/2020 which were consistent with osteoarthritic changes.  No erosive changes noted.  Chronic pain of left knee-she has been experiencing pain and discomfort in her left knee joint for several years.  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 Charlett Blake several years ago.  Earlier this year she started experiencing give out sensation in her left knee joint.  She was evaluated by Dr. Debby Bud and was diagnosed with meniscal tear.  She underwent meniscal tear repair in February 2024.  She has been doing some exercises at home and is recovering well.  Warmth swelling or effusion was noted today.  Trapezius muscle spasm-she continues to trapezius spasm.  Range of motion exercises were emphasized.  DDD (degenerative disc disease), cervical -she denies any discomfort in the cervical spine today.  MRI performed on  05/03/20.  She establish care with Dr. Alvester Morin in September 2021.  She had facet joint injections on 06/02/2020, 03/30/2021, and 01/31/2022.  Pain in thoracic spine -she has been going to physical therapy at [facility] which has been helpful with thoracic  pain.  Thoracic spine pain resolved after T9-T10 epidural injection done by Dr. Alvester Morin.She was also evaluated by neurosurgery and no additional treatment was advised  DDD (degenerative disc disease), lumbar-she has intermittent lower back pain.  She had no point tenderness and had good mobility.  Fibromyalgia-she continues to have generalized pain and discomfort from fibromyalgia.  Need for regular exercise and stretching was discussed.  She has been going to integrative therapies which has been helpful.  Primary insomnia -she continues to have insomnia.  She is on Ambien 10 mg at bedtime  TMJ (temporomandibular joint syndrome)-she gets massage therapy from integr[facility].  Age-related osteoporosis without current pathological fracture - DEXA updated on 10/22/20: The BMD measured at Femur Neck Left is 0.560 g/cm2 with a T-score of -3.4. Prolia since 2019 initially prescribed by Dr. Talmage Nap.  Patient was advised to get repeat DEXA scan through Dr. Hyacinth Meeker.  History of gastroesophageal reflux (GERD)  History of IBS-followed by gastroenterology.  Interstitial cystitis  Orders: No orders of the defined types were placed in this encounter.  No orders of the defined types were placed in this encounter.    Follow-Up Instructions: Return in about 6 months (around 07/03/2023) for Osteoarthritis.   Pollyann Savoy, MD  Note - This record has been created using Animal nutritionist.  Chart creation errors have been sought, but may not always  have been located. Such creation errors do not reflect on  the standard of medical care.

## 2022-12-20 ENCOUNTER — Ambulatory Visit: Payer: 59 | Admitting: Nurse Practitioner

## 2022-12-20 ENCOUNTER — Encounter: Payer: Self-pay | Admitting: Nurse Practitioner

## 2022-12-20 VITALS — BP 102/69 | HR 64 | Ht 64.5 in | Wt 104.0 lb

## 2022-12-20 DIAGNOSIS — K5900 Constipation, unspecified: Secondary | ICD-10-CM

## 2022-12-20 DIAGNOSIS — Z86010 Personal history of colonic polyps: Secondary | ICD-10-CM | POA: Diagnosis not present

## 2022-12-20 NOTE — Progress Notes (Signed)
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   History of colon polyps.  20 mm tubular adenoma was removed in Nov 2023.   Plan  -Six month surveillance colonoscopy due May 2024, she is on the recall list . She will have this done early June.    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 See PMH / PSH for additional details.   She made this appt a while back for evaluation of constipation which  started around the time of colonoscopy in November. She increased fiber, took miralax and prune juice as needed which helped. Had to take antibiotics for a UTI in February. Treated with macrodantin then cipo then Doxycycline. Antiobiotcs led to diarrhea. She started a probiotic and GI symptoms resolved. BMs have been regular since.   On colonoscopy recall list for 01/02/23. She wants to wait until June to have the procedure   Starting pelvic floor PT next week for urinary incontinence.   Updated [employer] since we saw her.  She found out some information on family members.  Maternal grandmother had colon cancer ( 63-64 years old) Sister may have some type of GI cancer, workup in progress  Previous GI Evaluation   Colonoscopy Nov 2023 20 mm cecal polyp, completely removed. Left sided diverticular disease, hemorrhoids.  Path - tubular adenoma  Labs:       Latest Ref Rng & Units 10/03/2022    1:50 PM 01/01/2022    1:20 PM 09/08/2021    2:05 PM  Hepatic Function  Total Protein 6.0 - 8.3 g/dL 7.3  7.0    6.9  7.3   Albumin 3.5 - 5.2 g/dL 4.6   4.4   AST 0 - 37 U/L ALT 0 - 35 U/L Alk Phosphatase 39 - 117 U/L 60   66   Total Bilirubin 0.2 - 1.2 mg/dL 0.4  0.4  0.4   Bilirubin, Direct 0.0 - 0.3 mg/dL 0.1   0.1        Latest Ref Rng &  Units 01/01/2022    1:20 PM 09/08/2021    2:05 PM 09/06/2020    1:58 PM  CBC  WBC 3.8 - 10.8 Thousand/uL 5.0  4.6  4.3   Hemoglobin 11.7 - 15.5 g/dL 16.1  09.6  04.5   Hematocrit 35.0 - 45.0 % 41.3  44.0  42.3   Platelets 140 - 400 Thousand/uL 204  211.0  177.0     Past Medical History:  Diagnosis Date   Cervical disc disease 2006   COPD (chronic obstructive pulmonary disease)    Emphysema lung    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     Fibromyalgia    sees Dr. 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 Norwood Levo, adenmatous polyp, repeat in 6 months   CYSTOSCOPY N/A 09/01/2018   Procedure: CYSTOSCOPY;  Surgeon: Jerene Bears, MD;  Location: [facility];  Service: Gynecology;  Laterality: N/A;   DILATATION & CURETTAGE/HYSTEROSCOPY WITH MYOSURE N/A 04/12/2017   Procedure: DILATATION & CURETTAGE/HYSTEROSCOPY;  Surgeon: Jerene Bears, MD;  Location: [facility];  Service: Gynecology;  Laterality: N/A;   epidural thoracic spine injection  06/07/2022   facet joint block  03/30/2021   per patient, performed by Dr. Alvester Morin   facet joint block  01/31/2022   per patient, performed by Dr. Alvester Morin   HYSTEROSCOPY     years ago with Dr Arelia Sneddon-? 2000   TOTAL LAPAROSCOPIC HYSTERECTOMY WITH SALPINGECTOMY Bilateral 09/01/2018   Procedure: TOTAL LAPAROSCOPIC HYSTERECTOMY WITH SALPINGECTOMY  BSO, REPAIR OF PERINEAL LACERATION;  Surgeon: Jerene Bears, MD;  Location: [facility];  Service: Gynecology;  Laterality: Bilateral;  possible BSO   URETHRAL DILATION      Current  Medications, Allergies, Family History and Social History were reviewed in [employer] record.     Current Outpatient Medications  Medication Sig Dispense Refill   albuterol (VENTOLIN HFA) 108 (90 Base) MCG/ACT inhaler Inhale 1-2 puffs into the lungs every 6 (six) hours as needed for wheezing or shortness of breath. 18 g 2   atorvastatin (LIPITOR) 10 MG tablet Take 1 tablet (10 mg total) by mouth daily. 90 tablet 3   butalbital-acetaminophen-caffeine (FIORICET) 50-325-40 MG tablet TAKE 1 TABLET BY MOUTH EVERY 6 HOURS AS NEEDED 120 tablet 5   CALCIUM PO Take 1 tablet by mouth daily.     cetirizine (ZYRTEC) 10 MG tablet Take 10 mg by mouth daily as needed for allergies (spring time).     chlorzoxazone (PARAFON) 500 MG tablet TAKE 1 TABLET BY MOUTH FOUR TIMES DAILY AS NEEDED FOR MUSCLE SPASMS 120 tablet 5   cholecalciferol (VITAMIN D) 1000 units tablet Take 2,000 Units by mouth daily.      Cyanocobalamin (VITAMIN B-12 PO) Take 1 tablet by mouth daily.      cyclobenzaprine (FLEXERIL) 10 MG tablet TAKE 1 TABLET BY MOUTH THREE TIMES DAILY AS NEEDED FOR MUSCLE SPASMS 270 tablet 1   denosumab (PROLIA) 60 MG/ML SOSY injection Inject 60 mg into the skin every 6 (six) months.     estradiol (ESTRACE) 1 MG tablet Take 1 tablet (1 mg total) by mouth daily. 90 tablet 3   Fluticasone-Umeclidin-Vilant (TRELEGY ELLIPTA) 100-62.5-25 MCG/ACT AEPB INHALE 1 PUFF INTO THE LUNGS DAILY 60 each 2   ipratropium-albuterol (DUONEB) 0.5-2.5 (3) MG/3ML SOLN Take 3 mLs by nebulization every 6 (six) hours as needed. 360 mL 11   terconazole (TERAZOL 7) 0.4 % vaginal cream Place 1 applicator vaginally at bedtime. 45 g 0   zolmitriptan (ZOMIG) 5 MG tablet TAKE 1 TABLET BY MOUTH AS NEEDED AS DIRECTED 30 tablet 1   zolpidem (AMBIEN) 10 MG tablet TAKE 1 TABLET(10 MG) BY MOUTH AT BEDTIME 30 tablet 5   phenazopyridine (PYRIDIUM) 200 MG tablet Take 1 tablet (200 mg total) by mouth 3 (three) times daily as needed  (urinary pain). 60 tablet 1   No current facility-administered medications for this visit.    Review of Systems: No chest pain. No shortness of breath. No urinary complaints.    Physical Exam  Wt Readings from Last 3 Encounters:  12/20/22 104 lb (47.2  kg)  12/11/22 106 lb 9.6 oz (48.4 kg)  11/12/22 106 lb 3.2 oz (48.2 kg)    BP 102/69   Pulse 64   Ht 5' 4.5" (1.638 m)   Wt 104 lb (47.2 kg)   LMP 09/03/2000 (Approximate)   BMI 17.58 kg/m  Constitutional:  Pleasant, generally well appearing but thin female in no acute distress. Psychiatric: Normal mood and affect. Behavior is normal. EENT: Pupils normal.  Conjunctivae are normal. No scleral icterus. Neck supple.  Cardiovascular: Normal rate, regular rhythm.  Pulmonary/chest: Effort normal and breath sounds normal. No wheezing, rales or rhonchi. Abdominal: Soft, nondistended, nontender. Bowel sounds active throughout. There are no masses palpable. No hepatomegaly. Neurological: Alert and oriented to person place and time.  Extremities: no edema Skin: Skin is warm and dry. No rashes noted.  Willette Cluster, NP  12/20/2022, 3:14 PM

## 2022-12-20 NOTE — Patient Instructions (Addendum)
_______________________________________________________  If your blood pressure at your visit was 140/90 or greater, please contact your primary care physician to follow up on this.  _______________________________________________________  If you are age 65 or older, your body mass index should be between 23-30. Your Body mass index is 17.58 kg/m. If this is out of the aforementioned range listed, please consider follow up with your Primary Care Provider.  If you are age 57 or younger, your body mass index should be between 19-25. Your Body mass index is 17.58 kg/m. If this is out of the aformentioned range listed, please consider follow up with your Primary Care Provider.   ________________________________________________________  The [facility] providers would like to encourage you to use [facility] to communicate with providers for non-urgent requests or questions.  Due to long hold times on the telephone, sending your provider a message by [facility] may be a faster and more efficient way to get a response.  Please allow 48 business hours for a response.  Please remember that this is for non-urgent requests.  _______________________________________________________  Bonita Quin have been scheduled for a colonoscopy. Please follow written instructions given to you at your visit today.  Please pick up your prep supplies at the pharmacy within the next 1-3 days. If you use inhalers (even only as needed), please bring them with you on the day of your procedure.  It was a pleasure to see you today!  Thank you for trusting me with your gastrointestinal care!

## 2022-12-21 NOTE — Progress Notes (Signed)
I agree with the assessment and plan as outlined by Ms. Guenther. 

## 2022-12-25 NOTE — Therapy (Signed)
OUTPATIENT PHYSICAL THERAPY FEMALE PELVIC EVALUATION   Patient Name: Emily Ward MRN: [id number] DOB:04-02-1956, 67 y.o., female Today's Date: 12/26/2022  END OF SESSION:  PT End of Session - 12/26/22 1235     Visit Number 1    Date for PT Re-Evaluation 03/20/23    Authorization Type U[employer]    PT Start Time 1230    PT Stop Time 1310    PT Time Calculation (min) 40 min    Activity Tolerance Patient tolerated treatment well    Behavior During Therapy [facility] for tasks assessed/performed             Past Medical History:  Diagnosis Date   Cervical disc disease 2006   COPD (chronic obstructive pulmonary disease)    Emphysema lung    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     Fibromyalgia    sees Dr. 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 Norwood Levo, adenmatous polyp, repeat in 6 months   CYSTOSCOPY N/A 09/01/2018   Procedure: CYSTOSCOPY;  Surgeon: Jerene Bears, MD;  Location: [facility];  Service: Gynecology;  Laterality: N/A;   DILATATION & CURETTAGE/HYSTEROSCOPY WITH MYOSURE N/A 04/12/2017   Procedure: DILATATION & CURETTAGE/HYSTEROSCOPY;  Surgeon: Jerene Bears, MD;  Location: [facility];  Service: Gynecology;  Laterality: N/A;   epidural thoracic spine injection  06/07/2022   facet joint block  03/30/2021   per patient, performed by Dr. Alvester Morin   facet joint block  01/31/2022   per patient, performed by Dr. Alvester Morin   HYSTEROSCOPY     years ago with Dr Arelia Sneddon-? 2000   TOTAL LAPAROSCOPIC HYSTERECTOMY WITH SALPINGECTOMY Bilateral 09/01/2018   Procedure:  TOTAL LAPAROSCOPIC HYSTERECTOMY WITH SALPINGECTOMY  BSO, REPAIR OF PERINEAL LACERATION;  Surgeon: Jerene Bears, MD;  Location: [facility];  Service: Gynecology;  Laterality: Bilateral;  possible BSO   URETHRAL DILATION     Patient Active Problem List   Diagnosis Date Noted   Recurrent UTI 10/04/2022   Hyperparathyroidism 10/18/2020   Osteoporosis 10/18/2020   Concussion 10/27/2018   MGUS (monoclonal gammopathy of unknown significance) 02/13/2018   COPD with emphysema 10/04/2017   Palpitations 07/30/2017   Shortness of breath 07/30/2017   Hormone replacement therapy (HRT) 03/30/2017   Former smoker 03/30/2017   Other fatigue 06/30/2016   Insomnia 06/30/2016   DJD (degenerative joint disease), cervical 06/30/2016   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/2016   IBS (irritable bowel syndrome) 06/30/2016   Interstitial cystitis 06/30/2016   SHOULDER PAIN, RIGHT 09/05/2010   GERD 03/24/2009   SACROILIAC STRAIN 03/24/2009   Atypical chest pain 01/20/2008   HEMATURIA UNSPECIFIED 10/14/2007   FIBROMYALGIA 07/03/2007   HYPERLIPIDEMIA NEC/NOS 05/16/2007   HEADACHE 05/09/2007    PCP: Gershon Crane, MD  REFERRING PROVIDER: Crist Fat, MD   REFERRING DIAG: N30.20 (ICD-10-CM) - Other chronic cystitis without hematuria   THERAPY DIAG:  Unspecified lack of coordination  Muscle weakness (generalized)  Rationale  for Evaluation and Treatment: Rehabilitation  ONSET DATE: 04/2022  SUBJECTIVE:                                                                                                                                                                                           SUBJECTIVE STATEMENT: History of constipation and dyspareunia; Patient had a large polyp in the cecum and since then she had trouble with constipation. 10/2022 had a UTI that would not go away. The stress incontinence is getting better from the last time in PT.  The urologist felt the constipation was pushing on the bladder and contribute to UTI's. I just got over the yeast infection and finished my medication. MD suggested the bladder sling.  Fluid intake: Yes: water    PAIN:  Are you having pain? No  PRECAUTIONS: None  WEIGHT BEARING RESTRICTIONS: No  FALLS:  Has patient fallen in last 6 months? No  LIVING ENVIRONMENT: Lives with: lives with their spouse  OCCUPATION: retired  PLOF: Independent  PATIENT GOALS: improve the urinary leakage  PERTINENT HISTORY:  COPD; MVP; Osteoporosis; Cervical laminectomy 2006; cesarean section 1993; total laparoscopic hysterectomy with salpingectomy;   BOWEL MOVEMENT: Pain with bowel movement: No Type of bowel movement:Type (Bristol Stool Scale) Type 3, Frequency daily, and Strain No Fully empty rectum: Yes: but will have an occasional feeling of not fully empty Leakage: No   URINATION: Pain with urination: No Fully empty bladder: No Stream: Strong when she does not rush to bathroom frequently Urgency: Yes: sometimes Frequency: urinate every 2 hours and sometimes will go longer Leakage: Urge to void, Coughing, Sneezing, Laughing, Lifting, and lifting something while walking Pads: Yes: pantyliner 3-4 and change just for freshness  INTERCOURSE: Pain with intercourse: Initial Penetration, During Penetration, and Pain Interrupts Intercourse Ability to have vaginal penetration:  Yes:   Marinoff Scale: 3/3  PREGNANCY: Vaginal deliveries 1 Tearing Yes: not sure how much C-section deliveries 1   PROLAPSE: None   OBJECTIVE:   DIAGNOSTIC FINDINGS:  none   COGNITION: Overall cognitive status: Within functional limits for tasks assessed        POSTURE: No Significant postural limitations  PELVIC ALIGNMENT: in correct alignment   LOWER EXTREMITY ROM: bilateral hip ROM is full   LOWER EXTREMITY MMT:  MMT Right eval Left eval  Hip extension 4/5 4/5  Hip abduction 4/5 4/5   Hip adduction 4/5 4/5   PALPATION:   General  tenderness and tightness in the lower abdomen, good movement of the lower rib cage.                 External Perineal Exam  intact                             Internal Pelvic Floor tenderness located on the left levator ani and obturator internist, restrictions on the urethra, tightness along the introitus  Patient confirms identification and approves PT to assess internal pelvic floor and treatment Yes  PELVIC MMT:   MMT eval  Vaginal 2/5 ant and post.  1/5 sides  (Blank rows = not tested)        TONE: Increased at entrance  PROLAPSE: none  TODAY'S TREATMENT:                                                                                                                              DATE: 12/26/22  EVAL See below   PATIENT EDUCATION:  12/26/22 Education details: Access Code: [email or web address] Person educated: Patient Education method: Programmer, multimedia, Demonstration, Actor cues, Verbal cues, and Handouts Education comprehension: verbalized understanding, returned demonstration, verbal cues required, tactile cues required, and needs further education  HOME EXERCISE PROGRAM: 12/26/22 Access Code: [email or web address] URL: [email or web address] Date: 12/26/2022 Prepared by: Eulis Foster  Exercises - Supine Diaphragmatic Breathing  - 1 x daily - 7 x weekly - 1 sets - 10 reps - Supine Pelvic Floor Contraction  - 1 x daily - 7 x weekly - 1 sets - 10 reps - Quick Flick Pelvic Floor Contractions in Hooklying  - 1 x daily - 7 x weekly - 1 sets - 10 reps - Seated Pelvic Floor Muscles Isometrics on Towel Roll  - 1 x daily - 7 x weekly - 1 sets - 10 reps - 5-8s holds - Quadruped Transversus Abdominis Bracing  - 1 x daily - 7 x weekly - 1 sets - 10 reps - Cat Cow  - 1 x daily - 7 x weekly - 1 sets - 10 reps - Sidelying Thoracic Rotation with Open Book  - 1 x daily - 7 x weekly - 1 sets - 2-3 reps - 30-45s holds  ASSESSMENT:  CLINICAL  IMPRESSION: Patient is a 67 y.o. female who was seen today for physical therapy evaluation and treatment for chronic cystitis without hematuria. Patient will leak with urge to void, coughing, sneezing, laughing, lifting, and lifting something while walking. Pelvic floor strength is 2/5 anterior posterior and 1/5 laterally. She has restrictions around the urethra, tightness on the sides of the introitus, and tenderness located right iliococcygeus and obturator  internist. She has tightness in the lower abdominal area. Patient will benefit from skilled therapy to improve pelvic floor coordination to reduce leakage.   OBJECTIVE IMPAIRMENTS: decreased activity tolerance, decreased endurance, decreased strength, increased fascial restrictions, and increased muscle spasms.   ACTIVITY LIMITATIONS: continence and locomotion level  PARTICIPATION LIMITATIONS: shopping and community activity  PERSONAL FACTORS: 3+ comorbidities: COPD; MVP; Osteoporosis; Cervical laminectomy 2006; cesarean section 1993; total laparoscopic hysterectomy with salpingectomy;   are also affecting patient's  functional outcome.   REHAB POTENTIAL: Excellent  CLINICAL DECISION MAKING: Stable/uncomplicated  EVALUATION COMPLEXITY: Low   GOALS: Goals reviewed with patient? Yes  SHORT TERM GOALS: Target date: 01/18/23  Patient independent with initial HEP for diaphragmatic breathing with pelvic floor drop.  Baseline: Goal status: INITIAL  2.  Patient is independent with perineal massage to lengthen tissue.  Baseline:  Goal status: INITIAL    LONG TERM GOALS: Target date: 03/20/23  Patient independent with advanced HEP for core and pelvic floor strength.  Baseline:  Goal status: INITIAL  2.  Patient pelvic floor strength is >/= 3/5 with circular hug of therapist finger.  Baseline:  Goal status: INITIAL  3.  Patient is able to cough, sneeze and laugh with 75% less urinary leakage.  Baseline:  Goal status:  INITIAL  4.  Patient is able to lift 10 #  and walk without leaking urine due to improved contractions.  Baseline:  Goal status: INITIAL   PLAN:  PT FREQUENCY: 1x/week  PT DURATION: 12 weeks  PLANNED INTERVENTIONS: Therapeutic exercises, Therapeutic activity, Neuromuscular re-education, Patient/Family education, Dry Needling, Electrical stimulation, Biofeedback, and Manual therapy  PLAN FOR NEXT SESSION: manual work to the pelvic floor to elongate the muscles, diaphragmatic breathing for pelvic floor drop.    Eulis Foster, PT 12/26/22 5:00 PM

## 2022-12-26 ENCOUNTER — Encounter: Payer: Self-pay | Admitting: Physical Therapy

## 2022-12-26 ENCOUNTER — Ambulatory Visit: Payer: 59 | Attending: Urology | Admitting: Physical Therapy

## 2022-12-26 ENCOUNTER — Other Ambulatory Visit: Payer: Self-pay

## 2022-12-26 DIAGNOSIS — M6281 Muscle weakness (generalized): Secondary | ICD-10-CM | POA: Insufficient documentation

## 2022-12-26 DIAGNOSIS — R279 Unspecified lack of coordination: Secondary | ICD-10-CM | POA: Insufficient documentation

## 2022-12-31 ENCOUNTER — Encounter: Payer: Self-pay | Admitting: Physical Therapy

## 2022-12-31 ENCOUNTER — Ambulatory Visit: Payer: 59 | Admitting: Physical Therapy

## 2022-12-31 DIAGNOSIS — R279 Unspecified lack of coordination: Secondary | ICD-10-CM | POA: Diagnosis not present

## 2022-12-31 DIAGNOSIS — M6281 Muscle weakness (generalized): Secondary | ICD-10-CM

## 2022-12-31 NOTE — Therapy (Signed)
OUTPATIENT PHYSICAL THERAPY TREATMENT NOTE   Patient Name: Emily Ward MRN: [id number] DOB:[birth year], 67 y.o., female Today's Date: 12/31/2022  PCP: Gershon Crane, MD  REFERRING PROVIDER: Crist Fat, MD   END OF SESSION:   PT End of Session - 12/31/22 1447     Visit Number 2    Date for PT Re-Evaluation 03/20/23    Authorization Type [employer]    PT Start Time 1445    PT Stop Time 1525    PT Time Calculation (min) 40 min    Activity Tolerance Patient tolerated treatment well    Behavior During Therapy [facility] for tasks assessed/performed             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     Fibromyalgia    sees Dr. 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 Norwood Levo, adenmatous polyp, repeat in 6 months   CYSTOSCOPY N/A 09/01/2018   Procedure: CYSTOSCOPY;  Surgeon: Jerene Bears, MD;  Location: [facility];  Service: Gynecology;  Laterality: N/A;   DILATATION & CURETTAGE/HYSTEROSCOPY WITH MYOSURE N/A 04/12/2017   Procedure: DILATATION & CURETTAGE/HYSTEROSCOPY;  Surgeon: Jerene Bears, MD;  Location: [facility];  Service: Gynecology;  Laterality: N/A;   epidural thoracic spine injection  06/07/2022   facet joint block  03/30/2021   per patient, performed by Dr. Alvester Morin   facet joint block  01/31/2022   per patient, performed by Dr. Alvester Morin   HYSTEROSCOPY     years ago with Dr Arelia Sneddon-? 2000   TOTAL  LAPAROSCOPIC HYSTERECTOMY WITH SALPINGECTOMY Bilateral 09/01/2018   Procedure: TOTAL LAPAROSCOPIC HYSTERECTOMY WITH SALPINGECTOMY  BSO, REPAIR OF PERINEAL LACERATION;  Surgeon: Jerene Bears, MD;  Location[facility];  Service: Gynecology;  Laterality: Bilateral;  possible BSO   URETHRAL DILATION     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   Palpitations 07/30/2017   Shortness of breath 07/30/2017   Hormone replacement therapy (HRT) 03/30/2017   Former smoker 03/30/2017   Other fatigue 06/30/2016   Insomnia 06/30/2016   DJD (degenerative joint disease), cervical 06/30/2016   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/2016   IBS (irritable bowel syndrome) 06/30/2016   Interstitial cystitis 06/30/2016   SHOULDER PAIN, RIGHT 09/05/2010   GERD 03/24/2009   SACROILIAC STRAIN 03/24/2009   Atypical chest pain 01/20/2008   HEMATURIA UNSPECIFIED 10/14/2007   FIBROMYALGIA 07/03/2007   HYPERLIPIDEMIA NEC/NOS 05/16/2007   HEADACHE 05/09/2007    REFERRING DIAG: N30.20 (ICD-10-CM) - Other chronic cystitis without hematuria    THERAPY DIAG:  Unspecified lack of coordination  Muscle weakness (generalized)   Rationale for Evaluation and Treatment: Rehabilitation   ONSET DATE: 04/2022   SUBJECTIVE:                                                                                                                                                                                            SUBJECTIVE STATEMENT: I had constipation since the last time I saw you. I was able to clear everything out by today. When constipated I have to urinate more.     PAIN:  Are you having pain? No   PRECAUTIONS: None   WEIGHT BEARING  RESTRICTIONS: No   FALLS:  Has patient fallen in last 6 months? No   LIVING ENVIRONMENT: Lives with: lives with their spouse   OCCUPATION: retired   PLOF: Independent   PATIENT GOALS: improve the urinary leakage   PERTINENT HISTORY:  COPD; MVP; Osteoporosis; Cervical laminectomy 2006; cesarean section 1993; total laparoscopic hysterectomy with salpingectomy;    BOWEL MOVEMENT: Pain with bowel movement: No Type of bowel movement:Type (Bristol Stool Scale) Type 3, Frequency daily, and Strain No Fully empty rectum: Yes: but will have an occasional feeling of not fully empty Leakage: No     URINATION: Pain with urination: No Fully empty bladder: No Stream: Strong when she does not rush to bathroom frequently Urgency: Yes: sometimes Frequency: urinate every 2 hours and sometimes will go longer Leakage: Urge to void, Coughing, Sneezing, Laughing, Lifting, and lifting something while walking Pads: Yes: pantyliner 3-4 and change just for freshness   INTERCOURSE: Pain with intercourse: Initial Penetration, During Penetration, and Pain Interrupts Intercourse Ability to have vaginal penetration:  Yes:   Marinoff Scale: 3/3   PREGNANCY: Vaginal deliveries 1 Tearing Yes: not sure how much C-section deliveries 1     PROLAPSE: None     OBJECTIVE:    DIAGNOSTIC FINDINGS:  none     COGNITION: Overall cognitive status: Within functional limits for tasks assessed                                POSTURE: No Significant postural limitations   PELVIC ALIGNMENT: in correct alignment     LOWER EXTREMITY ROM: bilateral hip ROM is full     LOWER EXTREMITY MMT:   MMT Right eval Left eval  Hip extension 4/5 4/5  Hip abduction 4/5 4/5  Hip adduction 4/5 4/5    PALPATION:   General  tenderness and tightness in the lower abdomen, good movement of the lower rib cage.  External Perineal Exam intact                             Internal Pelvic Floor  tenderness located on the left levator ani and obturator internist, restrictions on the urethra, tightness along the introitus   Patient confirms identification and approves PT to assess internal pelvic floor and treatment Yes   PELVIC MMT:   MMT eval  Vaginal 2/5 ant and post.  1/5 sides  (Blank rows = not tested)         TONE: Increased at entrance   PROLAPSE: none   TODAY'S TREATMENT:    12/31/22 Manual: Soft tissue mobilization: Manual work to bilateral hip adductors Myofascial release: Release of the urogenital diaphragm Internal pelvic floor techniques: No emotional/communication barriers or cognitive limitation. Patient is motivated to learn. Patient understands and agrees with treatment goals and plan. PT explains patient will be examined in standing, sitting, and lying down to see how their muscles and joints work. When they are ready, they will be asked to remove their underwear so PT can examine their perineum. The patient is also given the option of providing their own chaperone as one is not provided in our facility. The patient also has the right and is explained the right to defer or refuse any part of the evaluation or treatment including the internal exam. With the patient's consent, PT will use one gloved finger to gently assess the muscles of the pelvic floor, seeing how well it contracts and relaxes and if there is muscle symmetry. After, the patient will get dressed and PT and patient will discuss exam findings and plan of care. PT and patient discuss plan of care, schedule, attendance policy and HEP activities. Going through the vaginal canal working on the introitus, along the puborectalis  with one finger internally and other externally Exercises: Stretches/mobility: Sit on small ball and massage the pelvic floor Diaphragmatic breathing to elongate the pelvic floor.                                                                                                                                   PATIENT EDUCATION:  12/26/22 Education details: Access Code: [email or web address] Person educated: Patient Education method: Explanation, Demonstration, Tactile cues, Verbal cues, and Handouts Education comprehension: verbalized understanding, returned demonstration, verbal cues required, tactile cues required, and needs further education   HOME EXERCISE PROGRAM: 12/26/22 Access Code: [email or web address] URL: [email or web address] Date: 12/26/2022 Prepared by: Eulis Foster   Exercises - Supine Diaphragmatic Breathing  - 1 x daily - 7 x weekly - 1 sets - 10 reps - Supine Pelvic Floor Contraction  - 1 x daily - 7 x weekly - 1 sets - 10 reps - Quick Flick Pelvic Floor Contractions in Hooklying  - 1 x daily - 7 x weekly - 1 sets - 10 reps - Seated Pelvic Floor Muscles Isometrics on  Towel Roll  - 1 x daily - 7 x weekly - 1 sets - 10 reps - 5-8s holds - Quadruped Transversus Abdominis Bracing  - 1 x daily - 7 x weekly - 1 sets - 10 reps - Cat Cow  - 1 x daily - 7 x weekly - 1 sets - 10 reps - Sidelying Thoracic Rotation with Open Book  - 1 x daily - 7 x weekly - 1 sets - 2-3 reps - 30-45s holds   ASSESSMENT:   CLINICAL IMPRESSION: Patient is a 67 y.o. female who was seen today for physical therapy  treatment for chronic cystitis without hematuria. Patient tissue has responded well to the manual work and was able to relax. Patient is not able to perform pelvic drop with diaphragmatic breathing. She is learning how to perform manual work on pelvic floor with sitting on a ball but irritated the labia.  Patient will benefit from skilled therapy to improve pelvic floor coordination to reduce leakage.    OBJECTIVE IMPAIRMENTS: decreased activity tolerance, decreased endurance, decreased strength, increased fascial restrictions, and increased muscle spasms.    ACTIVITY LIMITATIONS: continence and locomotion level   PARTICIPATION LIMITATIONS: shopping and community activity    PERSONAL FACTORS: 3+ comorbidities: COPD; MVP; Osteoporosis; Cervical laminectomy 2006; cesarean section 1993; total laparoscopic hysterectomy with salpingectomy;   are also affecting patient's functional outcome.    REHAB POTENTIAL: Excellent   CLINICAL DECISION MAKING: Stable/uncomplicated   EVALUATION COMPLEXITY: Low     GOALS: Goals reviewed with patient? Yes   SHORT TERM GOALS: Target date: 01/18/23   Patient independent with initial HEP for diaphragmatic breathing with pelvic floor drop.  Baseline: Goal status: INITIAL   2.  Patient is independent with perineal massage to lengthen tissue.  Baseline:  Goal status: INITIAL       LONG TERM GOALS: Target date: 03/20/23   Patient independent with advanced HEP for core and pelvic floor strength.  Baseline:  Goal status: INITIAL   2.  Patient pelvic floor strength is >/= 3/5 with circular hug of therapist finger.  Baseline:  Goal status: INITIAL   3.  Patient is able to cough, sneeze and laugh with 75% less urinary leakage.  Baseline:  Goal status: INITIAL   4.  Patient is able to lift 10 #  and walk without leaking urine due to improved contractions.  Baseline:  Goal status: INITIAL     PLAN:   PT FREQUENCY: 1x/week   PT DURATION: 12 weeks   PLANNED INTERVENTIONS: Therapeutic exercises, Therapeutic activity, Neuromuscular re-education, Patient/Family education, Dry Needling, Electrical stimulation, Biofeedback, and Manual therapy   PLAN FOR NEXT SESSION: manual work to the pelvic floor to elongate the muscles, diaphragmatic breathing for pelvic floor drop. Work on rib expansion, educate on perineal massage   Eulis Foster, PT 12/31/22 3:28 PM

## 2023-01-01 ENCOUNTER — Encounter: Payer: Self-pay | Admitting: Rheumatology

## 2023-01-01 ENCOUNTER — Encounter (HOSPITAL_BASED_OUTPATIENT_CLINIC_OR_DEPARTMENT_OTHER): Payer: Self-pay | Admitting: Obstetrics & Gynecology

## 2023-01-01 ENCOUNTER — Ambulatory Visit: Payer: 59 | Attending: Rheumatology | Admitting: Rheumatology

## 2023-01-01 VITALS — BP 105/69 | HR 85 | Resp 15 | Ht 64.75 in | Wt 104.8 lb

## 2023-01-01 DIAGNOSIS — M5030 Other cervical disc degeneration, unspecified cervical region: Secondary | ICD-10-CM | POA: Diagnosis not present

## 2023-01-01 DIAGNOSIS — M810 Age-related osteoporosis without current pathological fracture: Secondary | ICD-10-CM

## 2023-01-01 DIAGNOSIS — M797 Fibromyalgia: Secondary | ICD-10-CM

## 2023-01-01 DIAGNOSIS — M25562 Pain in left knee: Secondary | ICD-10-CM | POA: Diagnosis not present

## 2023-01-01 DIAGNOSIS — M62838 Other muscle spasm: Secondary | ICD-10-CM

## 2023-01-01 DIAGNOSIS — M19041 Primary osteoarthritis, right hand: Secondary | ICD-10-CM

## 2023-01-01 DIAGNOSIS — G8929 Other chronic pain: Secondary | ICD-10-CM

## 2023-01-01 DIAGNOSIS — M26609 Unspecified temporomandibular joint disorder, unspecified side: Secondary | ICD-10-CM

## 2023-01-01 DIAGNOSIS — M51369 Other intervertebral disc degeneration, lumbar region without mention of lumbar back pain or lower extremity pain: Secondary | ICD-10-CM

## 2023-01-01 DIAGNOSIS — N3010 Interstitial cystitis (chronic) without hematuria: Secondary | ICD-10-CM

## 2023-01-01 DIAGNOSIS — F5101 Primary insomnia: Secondary | ICD-10-CM

## 2023-01-01 DIAGNOSIS — M19042 Primary osteoarthritis, left hand: Secondary | ICD-10-CM

## 2023-01-01 DIAGNOSIS — M5136 Other intervertebral disc degeneration, lumbar region: Secondary | ICD-10-CM

## 2023-01-01 DIAGNOSIS — Z8719 Personal history of other diseases of the digestive system: Secondary | ICD-10-CM

## 2023-01-01 DIAGNOSIS — M546 Pain in thoracic spine: Secondary | ICD-10-CM

## 2023-01-02 ENCOUNTER — Encounter: Payer: Self-pay | Admitting: Pulmonary Disease

## 2023-01-02 ENCOUNTER — Ambulatory Visit: Payer: 59 | Admitting: Pulmonary Disease

## 2023-01-02 ENCOUNTER — Other Ambulatory Visit (HOSPITAL_BASED_OUTPATIENT_CLINIC_OR_DEPARTMENT_OTHER): Payer: Self-pay | Admitting: Obstetrics & Gynecology

## 2023-01-02 VITALS — BP 112/64 | HR 92 | Temp 97.9°F | Ht 65.0 in | Wt 104.6 lb

## 2023-01-02 DIAGNOSIS — J439 Emphysema, unspecified: Secondary | ICD-10-CM | POA: Diagnosis not present

## 2023-01-02 DIAGNOSIS — J4489 Other specified chronic obstructive pulmonary disease: Secondary | ICD-10-CM

## 2023-01-02 DIAGNOSIS — Z87891 Personal history of nicotine dependence: Secondary | ICD-10-CM | POA: Diagnosis not present

## 2023-01-02 DIAGNOSIS — Z8739 Personal history of other diseases of the musculoskeletal system and connective tissue: Secondary | ICD-10-CM

## 2023-01-02 NOTE — Progress Notes (Signed)
Emily Ward    [id number]    12/11/1955  Primary Care Physician:Fry, Tera Mater, MD  Referring Physician: Nelwyn Salisbury, MD [address] [place],  [place] [address]  Chief complaint: Follow-up for severe COPD  HPI: 67 year old with history of mitral valve prolapse, allergies, chronic headaches Here for follow-up of severe COPD Initially evaluated in December 2019.  Started on Anoro inhaler at that time Finished pulmonary rehab in 2020  Underwent total hysterectomy with salpingectomy on 09/01/2018 under general anesthesia without any respiratory issues.  Contracted COVID-19 in November 2020.  Symptoms are mostly fever, joint pain, headache and fatigue.  She did not have any respiratory symptoms except for throat irritation.  Did not need isolation  Diagnosed with COVID again in early 2022.  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 Smoking history: 35-pack-year smoker.  Quit in 2014 Travel history: No significant travel history Relevant family history: Mom had emphysema Emily Ward was a smoker)  Interim history: Anoro changed to Trelegy at last visit.  She likes this inhaler better Continues to exercise and stay active.  Here for review of CT scan  Outpatient Encounter Medications as of 01/02/2023  Medication Sig   atorvastatin (LIPITOR) 10 MG tablet Take 1 tablet (10 mg total) by mouth daily.   butalbital-acetaminophen-caffeine (FIORICET) 50-325-40 MG tablet TAKE 1 TABLET BY MOUTH EVERY 6 HOURS AS NEEDED   CALCIUM PO Take 1 tablet by mouth daily.   cetirizine (ZYRTEC) 10 MG tablet Take 10 mg by mouth daily as needed for allergies (spring time).   chlorzoxazone (PARAFON) 500 MG tablet TAKE 1 TABLET BY MOUTH FOUR TIMES DAILY AS NEEDED FOR MUSCLE SPASMS    cholecalciferol (VITAMIN D) 1000 units tablet Take 2,000 Units by mouth daily.    CRANBERRY PO Take by mouth daily.   Cyanocobalamin (VITAMIN B-12 PO) Take 1 tablet by mouth daily.    cyclobenzaprine (FLEXERIL) 10 MG tablet TAKE 1 TABLET BY MOUTH THREE TIMES DAILY AS NEEDED FOR MUSCLE SPASMS   denosumab (PROLIA) 60 MG/ML SOSY injection Inject 60 mg into the skin every 6 (six) months.   estradiol (ESTRACE) 1 MG tablet Take 1 tablet (1 mg total) by mouth daily.   Fluticasone-Umeclidin-Vilant (TRELEGY ELLIPTA) 100-62.5-25 MCG/ACT AEPB INHALE 1 PUFF INTO THE LUNGS DAILY   Probiotic Product (PROBIOTIC PO) Take by mouth daily.   terconazole (TERAZOL 7) 0.4 % vaginal cream Place 1 applicator vaginally at bedtime.   zolmitriptan (ZOMIG) 5 MG tablet TAKE 1 TABLET BY MOUTH AS NEEDED AS DIRECTED   zolpidem (AMBIEN) 10 MG tablet TAKE 1 TABLET(10 MG) BY MOUTH AT BEDTIME   albuterol (VENTOLIN HFA) 108 (90 Base) MCG/ACT inhaler Inhale 1-2 puffs into the lungs every 6 (six) hours as needed for wheezing or shortness of breath. (Patient not taking: Reported on 01/01/2023)   ipratropium-albuterol (DUONEB) 0.5-2.5 (3) MG/3ML SOLN Take 3 mLs by nebulization every 6 (six) hours as needed. (Patient not taking: Reported on 01/01/2023)   No facility-administered encounter medications on file as of 01/02/2023.   Physical Exam: Blood pressure 112/64, pulse 92, temperature 97.9 F (36.6 C), temperature source Oral, height 5\' 5"  (1.651 m), weight 104 lb 9.6 oz (47.4 kg), last menstrual period 09/03/2000, SpO2 98 %. Gen:      No  acute distress HEENT:  EOMI, sclera anicteric Neck:     No masses; no thyromegaly Lungs:    Clear to auscultation bilaterally; normal respiratory effort CV:         Regular rate and rhythm; no murmurs Abd:      + bowel sounds; soft, non-tender; no palpable masses, no distension Ext:    No edema; adequate peripheral perfusion Skin:      Warm and dry; no rash Neuro: alert and oriented x 3 Psych:  normal mood and affect   Data Reviewed: Imaging: Screening CT 04/27/2019-right lower lobe pulmonary nodule, emphysema, linear scarring in the lower lobes.  CT chest 06/13/21- Moderate emphysema, stable lung nodules  CT chest 11/13/2022-pulmonary nodule adjacent to the pleural surface in the right upper lobe, small irregular pulmonary nodule in the right upper lobe which are stable.  Moderate emphysema, small stable pericardial effusion. I have reviewed the images personally.  PFTs: 03/05/2019 FVC 2.22 [64%), FEV1 1.43 (53%),F/F 64, TLC 6.78 [130%], DLCO 9.79 [46%] Severe obstruction with air trapping and hyperinflation Severe diffusion defect  Cardiac: Echo 08/21/17- LVEF 60-65%, normal wall thickness, normal wall motion, normal diastolic function, normal LA size ,trivial MR, normal RV size with low normal RV systolic function, normal IVC.  Labs: CBC 10/22/2018-WBC 6.5, eos 1%, absolute eosinophil count 65 Alpha-1 antitrypsin 08/15/2018-126, PI MM  Assessment:  Severe COPD Stable on Trelegy inhaler, DuoNebs as needed Maintains an active exercise regimen   Ex-smoker. Lung nodules CT reviewed with stable lung nodules.  She has a small pericardial effusion which has been stable and can be observed for now.  Can start low-dose screening CTs to start in March 2025  Health maintenance Pneumovax 03/23/2019  Plan/Recommendations: - Annual screening CT. - Continue Trelegy  Emily Greathouse MD [facility] 01/02/2023, 2:19 PM  CC: Emily Salisbury, MD

## 2023-01-02 NOTE — Patient Instructions (Signed)
I am glad you are doing well with your breathing.  Your CT scan shows stable lung nodules which is good news Will refer for low-dose screening CT to start in March 2025 Follow-up in 1 year

## 2023-01-03 ENCOUNTER — Other Ambulatory Visit (HOSPITAL_BASED_OUTPATIENT_CLINIC_OR_DEPARTMENT_OTHER): Payer: Self-pay | Admitting: Obstetrics & Gynecology

## 2023-01-03 DIAGNOSIS — Z8739 Personal history of other diseases of the musculoskeletal system and connective tissue: Secondary | ICD-10-CM

## 2023-01-08 ENCOUNTER — Other Ambulatory Visit (INDEPENDENT_AMBULATORY_CARE_PROVIDER_SITE_OTHER): Payer: 59

## 2023-01-08 DIAGNOSIS — E785 Hyperlipidemia, unspecified: Secondary | ICD-10-CM | POA: Diagnosis not present

## 2023-01-08 LAB — LIPID PANEL
Cholesterol: 197 mg/dL (ref 0–200)
HDL: 81.3 mg/dL (ref >39.00)
LDL Cholesterol: 101 mg/dL — ABNORMAL HIGH (ref 0–99)
NonHDL: 115.72
Total CHOL/HDL Ratio: 2
Triglycerides: 75 mg/dL (ref 0.0–149.0)
VLDL: 15 mg/dL (ref 0.0–40.0)

## 2023-01-08 NOTE — Addendum Note (Signed)
Addended by: Donald Pore A on: 01/08/2023 11:06 AM   Modules accepted: Orders

## 2023-01-16 ENCOUNTER — Ambulatory Visit: Payer: 59 | Attending: Urology | Admitting: Physical Therapy

## 2023-01-16 ENCOUNTER — Encounter: Payer: Self-pay | Admitting: Physical Therapy

## 2023-01-16 DIAGNOSIS — R279 Unspecified lack of coordination: Secondary | ICD-10-CM | POA: Diagnosis present

## 2023-01-16 DIAGNOSIS — M6281 Muscle weakness (generalized): Secondary | ICD-10-CM | POA: Insufficient documentation

## 2023-01-16 NOTE — Therapy (Signed)
OUTPATIENT PHYSICAL THERAPY TREATMENT NOTE   Patient Name: Emily Ward MRN: [id number] DOB:1955/12/20, 67 y.o., female Today's Date: 01/16/2023  PCP: Gershon Crane, MD  REFERRING PROVIDER: Crist Fat, MD   END OF SESSION:   PT End of Session - 01/16/23 1443     Visit Number 3    Date for PT Re-Evaluation 03/20/23    Authorization Type [employer]    PT Start Time 1445    PT Stop Time 1525    PT Time Calculation (min) 40 min    Activity Tolerance Patient tolerated treatment well    Behavior During Therapy [facility] for tasks assessed/performed             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     Fibromyalgia    sees Dr. 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 Norwood Levo, adenmatous polyp, repeat in 6 months   CYSTOSCOPY N/A 09/01/2018   Procedure: CYSTOSCOPY;  Surgeon: Jerene Bears, MD;  Location: [facility];  Service: Gynecology;  Laterality: N/A;   DILATATION & CURETTAGE/HYSTEROSCOPY WITH MYOSURE N/A 04/12/2017   Procedure: DILATATION & CURETTAGE/HYSTEROSCOPY;  Surgeon: Jerene Bears, MD;  Location: [facility];  Service: Gynecology;  Laterality: N/A;   epidural thoracic spine injection  06/07/2022   facet joint block  03/30/2021   per patient, performed by Dr. Alvester Morin   facet joint block  01/31/2022   per patient, performed by Dr. Alvester Morin   HYSTEROSCOPY     years ago with Dr Arelia Sneddon-? 2000   KNEE  ARTHROSCOPY Left 10/24/2022   TOTAL LAPAROSCOPIC HYSTERECTOMY WITH SALPINGECTOMY Bilateral 09/01/2018   Procedure: TOTAL LAPAROSCOPIC HYSTERECTOMY WITH SALPINGECTOMY  BSO, REPAIR OF PERINEAL LACERATION;  Surgeon: Jerene Bears, MD;  Location: [facility];  Service: Gynecology;  Laterality: Bilateral;  possible BSO   URETHRAL DILATION     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   Palpitations 07/30/2017   Shortness of breath 07/30/2017   Hormone replacement therapy (HRT) 03/30/2017   Former smoker 03/30/2017   Other fatigue 06/30/2016   Insomnia 06/30/2016   DJD (degenerative joint disease), cervical 06/30/2016   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/2016   IBS (irritable bowel syndrome) 06/30/2016   Interstitial cystitis 06/30/2016   SHOULDER PAIN, RIGHT 09/05/2010   GERD 03/24/2009   SACROILIAC STRAIN 03/24/2009   Atypical chest pain 01/20/2008   HEMATURIA UNSPECIFIED 10/14/2007   FIBROMYALGIA 07/03/2007   HYPERLIPIDEMIA NEC/NOS 05/16/2007   HEADACHE 05/09/2007    REFERRING DIAG: N30.20 (ICD-10-CM) - Other chronic cystitis without hematuria    THERAPY DIAG:  Unspecified lack of coordination   Muscle weakness (generalized)   Rationale for Evaluation and Treatment: Rehabilitation   ONSET DATE: 04/2022   SUBJECTIVE:                                                                                                                                                                                            SUBJECTIVE STATEMENT: I got constipated again and it is out of the blue. I have a bowel movement daily. It gets harder to have a bowel movement and sometimes is a ball. I use Miralax every 1-2 weeks.  Sometimes I feel like I do not empty. Sometimes I feel like I have to have a bowel movement but I am emptied. I have a colonoscopy 02/17/23. I have issues with urinary leakage is when I am constipated.      PAIN:  Are you having pain? No   PRECAUTIONS: None   WEIGHT BEARING RESTRICTIONS: No   FALLS:  Has patient fallen in last 6 months? No   LIVING ENVIRONMENT: Lives with: lives with their spouse   OCCUPATION: retired   PLOF: Independent   PATIENT GOALS: improve the urinary leakage   PERTINENT HISTORY:  COPD; MVP; Osteoporosis; Cervical laminectomy 2006; cesarean section 1993; total laparoscopic hysterectomy with salpingectomy;    BOWEL MOVEMENT: Pain with bowel movement: No Type of bowel movement:Type (Bristol Stool Scale) Type 3, Frequency daily, and Strain No Fully empty rectum: Yes: but will have an occasional feeling of not fully empty Leakage: No     URINATION: Pain with urination: No Fully empty bladder: No Stream: Strong when she does not rush to bathroom frequently Urgency: Yes: sometimes Frequency: urinate every 2 hours and sometimes will go longer Leakage: Urge to void, Coughing, Sneezing, Laughing, Lifting, and lifting something while walking Pads: Yes: pantyliner 3-4 and change just for freshness   INTERCOURSE: Pain with intercourse: Initial Penetration, During Penetration, and Pain Interrupts Intercourse Ability to have vaginal penetration:  Yes:   Marinoff Scale: 3/3   PREGNANCY: Vaginal deliveries 1 Tearing Yes: not sure how much C-section deliveries 1     PROLAPSE: None     OBJECTIVE:    DIAGNOSTIC FINDINGS:  none     COGNITION: Overall cognitive status: Within functional limits for tasks assessed                                POSTURE: No Significant postural limitations   PELVIC ALIGNMENT: in correct alignment     LOWER EXTREMITY ROM: bilateral hip ROM is full     LOWER  EXTREMITY MMT:   MMT Right eval Left eval  Hip  extension 4/5 4/5  Hip abduction 4/5 4/5  Hip adduction 4/5 4/5    PALPATION:   General  tenderness and tightness in the lower abdomen, good movement of the lower rib cage.                  External Perineal Exam intact                             Internal Pelvic Floor tenderness located on the left levator ani and obturator internist, restrictions on the urethra, tightness along the introitus   Patient confirms identification and approves PT to assess internal pelvic floor and treatment Yes   PELVIC MMT:   MMT eval  Vaginal 2/5 ant and post.  1/5 sides  (Blank rows = not tested)         TONE: Increased at entrance   PROLAPSE: none   TODAY'S TREATMENT:   01/16/23 Neuromuscular re-education: Pelvic floor contraction training: Using the RUSI to work on aberrant movement of bulging the pelvic floor instead of contraction to reduce urinary leakage. She will bulge her abdomen and pelvic floor with contraction. Patient was able to demonstrate a pelvic floor contraction with lift by the end of the treatment. She was able to bulge the pelvic floor very well with good excursion.  Down training: Diaphragmatic breathing in sitting with hands on the lateral rib cage then feeling the pelvic floor drop      12/31/22 Manual: Soft tissue mobilization: Manual work to bilateral hip adductors Myofascial release: Release of the urogenital diaphragm Internal pelvic floor techniques: No emotional/communication barriers or cognitive limitation. Patient is motivated to learn. Patient understands and agrees with treatment goals and plan. PT explains patient will be examined in standing, sitting, and lying down to see how their muscles and joints work. When they are ready, they will be asked to remove their underwear so PT can examine their perineum. The patient is also given the option of providing their own chaperone as one is not provided in our facility. The patient also has the right and is  explained the right to defer or refuse any part of the evaluation or treatment including the internal exam. With the patient's consent, PT will use one gloved finger to gently assess the muscles of the pelvic floor, seeing how well it contracts and relaxes and if there is muscle symmetry. After, the patient will get dressed and PT and patient will discuss exam findings and plan of care. PT and patient discuss plan of care, schedule, attendance policy and HEP activities. Going through the vaginal canal working on the introitus, along the puborectalis  with one finger internally and other externally Exercises: Stretches/mobility: Sit on small ball and massage the pelvic floor Diaphragmatic breathing to elongate the pelvic floor.  PATIENT EDUCATION:  12/26/22 Education details: Access Code: [email or web address] Person educated: Patient Education method: Explanation, Demonstration, Tactile cues, Verbal cues, and Handouts Education comprehension: verbalized understanding, returned demonstration, verbal cues required, tactile cues required, and needs further education   HOME EXERCISE PROGRAM: 12/26/22 Access Code: [email or web address] URL: [email or web address] Date: 12/26/2022 Prepared by: Eulis Foster   Exercises - Supine Diaphragmatic Breathing  - 1 x daily - 7 x weekly - 1 sets - 10 reps - Supine Pelvic Floor Contraction  - 1 x daily - 7 x weekly - 1 sets - 10 reps - Quick Flick Pelvic Floor Contractions in Hooklying  - 1 x daily - 7 x weekly - 1 sets - 10 reps - Seated Pelvic Floor Muscles Isometrics on Towel Roll  - 1 x daily - 7 x weekly - 1 sets - 10 reps - 5-8s holds - Quadruped Transversus Abdominis Bracing  - 1 x daily - 7 x weekly - 1 sets - 10 reps - Cat Cow  - 1 x daily - 7 x weekly - 1 sets - 10 reps - Sidelying Thoracic Rotation with Open Book  - 1 x daily - 7 x  weekly - 1 sets - 2-3 reps - 30-45s holds   ASSESSMENT:   CLINICAL IMPRESSION: Patient is a 67 y.o. female who was seen today for physical therapy  treatment for chronic cystitis without hematuria.  Patient did the pelvic drop and the urgency for a bowel movement left. Patient will leak urine when she is constipated.  Patient is able to bulge her pelvic floor very well. She needed to learn how to contract her pelvic floor without bulging her abdomen putting pressure on the pelvic floor. She was able to laugh with a pelvic floor contraction. Patient will benefit from skilled therapy to improve pelvic floor coordination to reduce leakage.    OBJECTIVE IMPAIRMENTS: decreased activity tolerance, decreased endurance, decreased strength, increased fascial restrictions, and increased muscle spasms.    ACTIVITY LIMITATIONS: continence and locomotion level   PARTICIPATION LIMITATIONS: shopping and community activity   PERSONAL FACTORS: 3+ comorbidities: COPD; MVP; Osteoporosis; Cervical laminectomy 2006; cesarean section 1993; total laparoscopic hysterectomy with salpingectomy;   are also affecting patient's functional outcome.    REHAB POTENTIAL: Excellent   CLINICAL DECISION MAKING: Stable/uncomplicated   EVALUATION COMPLEXITY: Low     GOALS: Goals reviewed with patient? Yes   SHORT TERM GOALS: Target date: 01/18/23   Patient independent with initial HEP for diaphragmatic breathing with pelvic floor drop.  Baseline: Goal status: Met 01/16/23   2.  Patient is independent with perineal massage to lengthen tissue.  Baseline:  Goal status: INITIAL       LONG TERM GOALS: Target date: 03/20/23   Patient independent with advanced HEP for core and pelvic floor strength.  Baseline:  Goal status: INITIAL   2.  Patient pelvic floor strength is >/= 3/5 with circular hug of therapist finger.  Baseline:  Goal status: ongoing 01/16/23   3.  Patient is able to cough, sneeze and laugh with 75%  less urinary leakage.  Baseline:  Goal status: ongoing 01/16/23   4.  Patient is able to lift 10 #  and walk without leaking urine due to improved contractions.  Baseline:  Goal status: INITIAL     PLAN:   PT FREQUENCY: 1x/week   PT DURATION: 12 weeks   PLANNED INTERVENTIONS: Therapeutic exercises, Therapeutic activity, Neuromuscular re-education, Patient/Family education, Dry Needling, Electrical stimulation, Biofeedback, and Manual therapy   PLAN FOR  NEXT SESSION: manual work to the pelvic floor to elongate the muscles,  Work on rib expansion, educate on perineal massage   Eulis Foster, PT 01/16/23 3:31 PM

## 2023-01-21 ENCOUNTER — Other Ambulatory Visit: Payer: Self-pay | Admitting: Obstetrics & Gynecology

## 2023-01-21 ENCOUNTER — Ambulatory Visit
Admission: RE | Admit: 2023-01-21 | Discharge: 2023-01-21 | Disposition: A | Discharge status: Home / Self Care | Payer: 59 | Source: Ambulatory Visit | Attending: Obstetrics & Gynecology | Admitting: Obstetrics & Gynecology

## 2023-01-21 DIAGNOSIS — R921 Mammographic calcification found on diagnostic imaging of breast: Secondary | ICD-10-CM

## 2023-01-24 ENCOUNTER — Ambulatory Visit
Admission: RE | Admit: 2023-01-24 | Discharge: 2023-01-24 | Disposition: A | Discharge status: Home / Self Care | Payer: 59 | Source: Ambulatory Visit | Attending: Obstetrics & Gynecology | Admitting: Obstetrics & Gynecology

## 2023-01-24 ENCOUNTER — Other Ambulatory Visit: Payer: Self-pay | Admitting: General Practice

## 2023-01-24 DIAGNOSIS — R921 Mammographic calcification found on diagnostic imaging of breast: Secondary | ICD-10-CM

## 2023-01-24 HISTORY — PX: BREAST BIOPSY: SHX20

## 2023-01-29 ENCOUNTER — Encounter: Payer: Self-pay | Admitting: Internal Medicine

## 2023-02-02 ENCOUNTER — Other Ambulatory Visit: Payer: Self-pay | Admitting: Pulmonary Disease

## 2023-02-03 ENCOUNTER — Encounter: Payer: Self-pay | Admitting: Internal Medicine

## 2023-02-04 ENCOUNTER — Other Ambulatory Visit: Payer: Self-pay

## 2023-02-04 MED ORDER — NA SULFATE-K SULFATE-MG SULF 17.5-3.13-1.6 GM/177ML PO SOLN: ORAL | 0 refills | Status: DC

## 2023-02-11 ENCOUNTER — Ambulatory Visit (AMBULATORY_SURGERY_CENTER): Payer: 59 | Admitting: Internal Medicine

## 2023-02-11 ENCOUNTER — Other Ambulatory Visit: Payer: Self-pay | Admitting: Internal Medicine

## 2023-02-11 ENCOUNTER — Encounter: Payer: Self-pay | Admitting: Internal Medicine

## 2023-02-11 VITALS — BP 118/69 | HR 75 | Temp 97.5°F | Resp 11 | Ht 64.5 in | Wt 104.0 lb

## 2023-02-11 DIAGNOSIS — D120 Benign neoplasm of cecum: Secondary | ICD-10-CM

## 2023-02-11 DIAGNOSIS — Z09 Encounter for follow-up examination after completed treatment for conditions other than malignant neoplasm: Secondary | ICD-10-CM | POA: Diagnosis present

## 2023-02-11 DIAGNOSIS — Z86010 Personal history of colonic polyps: Secondary | ICD-10-CM

## 2023-02-11 DIAGNOSIS — D123 Benign neoplasm of transverse colon: Secondary | ICD-10-CM | POA: Diagnosis not present

## 2023-02-11 HISTORY — PX: COLONOSCOPY: SHX174

## 2023-02-11 MED ORDER — DICYCLOMINE HCL 10 MG PO CAPS: 10.0000 mg | ORAL_CAPSULE | Freq: Three times a day (TID) | ORAL | 0 refills | Status: DC | PRN

## 2023-02-11 MED ORDER — SODIUM CHLORIDE 0.9 % IV SOLN
500.0000 mL | INTRAVENOUS | Status: DC
Start: 2023-02-11 — End: 2023-02-11

## 2023-02-11 NOTE — Patient Instructions (Signed)
YOU HAD AN ENDOSCOPIC PROCEDURE TODAY AT THE [facility]:   Refer to the procedure report that was given to you for any specific questions about what was found during the examination.  If the procedure report does not answer your questions, please call your gastroenterologist to clarify.  If you requested that your care partner not be given the details of your procedure findings, then the procedure report has been included in a sealed envelope for you to review at your convenience later.  YOU SHOULD EXPECT: Some feelings of bloating in the abdomen. Passage of more gas than usual.  Walking can help get rid of the air that was put into your GI tract during the procedure and reduce the bloating. If you had a lower endoscopy (such as a colonoscopy or flexible sigmoidoscopy) you may notice spotting of blood in your stool or on the toilet paper. If you underwent a bowel prep for your procedure, you may not have a normal bowel movement for a few days.  Please Note:  You might notice some irritation and congestion in your nose or some drainage.  This is from the oxygen used during your procedure.  There is no need for concern and it should clear up in a day or so.  SYMPTOMS TO REPORT IMMEDIATELY:  Following lower endoscopy (colonoscopy or flexible sigmoidoscopy):  Excessive amounts of blood in the stool  Significant tenderness or worsening of abdominal pains  Swelling of the abdomen that is new, acute  Fever of 100F or higher  For urgent or emergent issues, a gastroenterologist can be reached at any hour by calling [phone]. Do not use MyChart messaging for urgent concerns.    DIET:  We do recommend a small meal at first, but then you may proceed to your regular diet.  Drink plenty of fluids but you should avoid alcoholic beverages for 24 hours.  ACTIVITY:  You should plan to take it easy for the rest of today and you should NOT DRIVE or use heavy machinery until tomorrow (because of  the sedation medicines used during the test).    FOLLOW UP: Our staff will call the number listed on your records the next business day following your procedure.  We will call around 7:15- 8:00 am to check on you and address any questions or concerns that you may have regarding the information given to you following your procedure. If we do not reach you, we will leave a message.     If any biopsies were taken you will be contacted by phone or by letter within the next 1-3 weeks.  Please call us at [phone] if you have not heard about the biopsies in 3 weeks.    SIGNATURES/CONFIDENTIALITY: You and/or your care partner have signed paperwork which will be entered into your electronic medical record.  These signatures attest to the fact that that the information above on your After Visit Summary has been reviewed and is understood.  Full responsibility of the confidentiality of this discharge information lies with you and/or your care-partner.  

## 2023-02-11 NOTE — Progress Notes (Signed)
Pt resting comfortably. VSS. Airway intact. SBAR complete to RN. All questions answered.   

## 2023-02-11 NOTE — Progress Notes (Signed)
Called to room to assist during endoscopic procedure.  Patient ID and intended procedure confirmed with present staff. Received instructions for my participation in the procedure from the performing physician.  

## 2023-02-11 NOTE — Progress Notes (Signed)
Patient reports no health or medication changes since pre visit. 

## 2023-02-11 NOTE — Progress Notes (Signed)
GASTROENTEROLOGY PROCEDURE H&P NOTE   Primary Care Physician: Nelwyn Salisbury, MD    Reason for Procedure:   History of colon polyps  Plan:    Colonoscopy  Patient is appropriate for endoscopic procedure(s) in the ambulatory ([facility]) setting.  The nature of the procedure, as well as the risks, benefits, and alternatives were carefully and thoroughly reviewed with the patient. Ample time for discussion and questions allowed. The patient understood, was satisfied, and agreed to proceed.     HPI: Emily Ward is a 67 y.o. female who presents for colonoscopy for evaluation of history of colon polysp .  Patient was most recently seen in the Gastroenterology Clinic on 12/20/22.  No interval change in medical history since that appointment. Please refer to that note for full details regarding GI history and clinical presentation.   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     Fibromyalgia    sees Dr. 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 Norwood Levo, adenmatous polyp, repeat in 6 months   CYSTOSCOPY N/A 09/01/2018   Procedure: CYSTOSCOPY;  Surgeon: Jerene Bears, MD;  Location: [facility];  Service: Gynecology;  Laterality: N/A;   DILATATION & CURETTAGE/HYSTEROSCOPY WITH MYOSURE N/A 04/12/2017   Procedure: DILATATION & CURETTAGE/HYSTEROSCOPY;  Surgeon: Jerene Bears, MD;  Location[facility];  Service: Gynecology;  Laterality: N/A;   epidural thoracic spine injection  06/07/2022   facet joint block  03/30/2021   per patient, performed by Dr. Alvester Morin   facet joint block  01/31/2022   per patient, performed by Dr. Alvester Morin   HYSTEROSCOPY     years ago with Dr Arelia Sneddon-? 2000   KNEE ARTHROSCOPY Left 10/24/2022   TOTAL LAPAROSCOPIC HYSTERECTOMY WITH SALPINGECTOMY Bilateral 09/01/2018   Procedure: TOTAL LAPAROSCOPIC HYSTERECTOMY WITH SALPINGECTOMY  BSO, REPAIR OF PERINEAL LACERATION;  Surgeon: Jerene Bears, MD;  Location: [facility];  Service: Gynecology;  Laterality: Bilateral;  possible BSO   URETHRAL DILATION      Prior to Admission medications   Medication Sig Start Date End Date Taking? Authorizing Provider  atorvastatin (LIPITOR) 10 MG tablet Take 1 tablet (10 mg total) by mouth daily. 10/05/22  Yes Nelwyn Salisbury, MD  butalbital-acetaminophen-caffeine (FIORICET) [phone] MG tablet TAKE 1 TABLET BY MOUTH EVERY 6 HOURS AS NEEDED 11/16/22  Yes Nelwyn Salisbury, MD  CALCIUM PO Take 1 tablet by mouth daily.   Yes [provider]  chlorzoxazone (PARAFON) 500 MG tablet TAKE 1 TABLET BY MOUTH  FOUR TIMES DAILY AS NEEDED FOR MUSCLE SPASMS 11/16/22  Yes Nelwyn Salisbury, MD  cholecalciferol (VITAMIN D) 1000 units tablet Take 2,000 Units by mouth daily.    Yes [provider]  CRANBERRY PO Take by mouth daily.   Yes [provider]  Cyanocobalamin (VITAMIN B-12 PO) Take 1 tablet by mouth daily.    Yes [provider]  cyclobenzaprine (FLEXERIL) 10 MG tablet TAKE 1 TABLET BY MOUTH THREE TIMES DAILY AS NEEDED FOR MUSCLE SPASMS 12/04/22  Yes Nelwyn Salisbury, MD  estradiol (ESTRACE) 1 MG tablet Take 1 tablet (1 mg total) by  mouth daily. 12/11/22  Yes Jerene Bears, MD  Fluticasone-Umeclidin-Vilant (TRELEGY ELLIPTA) 100-62.5-25 MCG/ACT AEPB INHALE 1 PUFF INTO THE LUNGS DAILY 02/04/23  Yes Mannam, Praveen, MD  Probiotic Product (PROBIOTIC PO) Take by mouth daily.   Yes [provider]  zolpidem (AMBIEN) 10 MG tablet TAKE 1 TABLET(10 MG) BY MOUTH AT BEDTIME 10/22/22  Yes Nelwyn Salisbury, MD  albuterol (VENTOLIN HFA) 108 (90 Base) MCG/ACT inhaler Inhale 1-2 puffs into the lungs every 6 (six) hours as needed for wheezing or shortness of breath. Patient not taking: Reported on 01/01/2023 06/12/22   Chilton Greathouse, MD  cetirizine (ZYRTEC) 10 MG tablet Take 10 mg by mouth daily as needed for allergies (spring time).    [provider]  denosumab (PROLIA) 60 MG/ML SOSY injection Inject 60 mg into the skin every 6 (six) months.    [provider]  ipratropium-albuterol (DUONEB) 0.5-2.5 (3) MG/3ML SOLN Take 3 mLs by nebulization every 6 (six) hours as needed. Patient not taking: Reported on 01/01/2023 03/20/22   Chilton Greathouse, MD  terconazole (TERAZOL 7) 0.4 % vaginal cream Place 1 applicator vaginally at bedtime. 12/11/22   Jerene Bears, MD  zolmitriptan (ZOMIG) 5 MG tablet TAKE 1 TABLET BY MOUTH AS NEEDED AS DIRECTED 09/10/22   Nelwyn Salisbury, MD    Current Outpatient Medications  Medication Sig Dispense Refill   atorvastatin (LIPITOR) 10 MG tablet Take 1 tablet (10 mg total) by mouth daily. 90 tablet 3   butalbital-acetaminophen-caffeine (FIORICET) 50-325-40 MG tablet TAKE 1 TABLET BY MOUTH EVERY 6 HOURS AS NEEDED 120 tablet 5   CALCIUM PO Take 1 tablet by mouth daily.     chlorzoxazone (PARAFON) 500 MG tablet TAKE 1 TABLET BY MOUTH FOUR TIMES DAILY AS NEEDED FOR MUSCLE SPASMS 120 tablet 5   cholecalciferol (VITAMIN D) 1000 units tablet Take 2,000 Units by mouth daily.      CRANBERRY PO Take by mouth daily.     Cyanocobalamin (VITAMIN B-12 PO) Take 1 tablet by mouth daily.      cyclobenzaprine  (FLEXERIL) 10 MG tablet TAKE 1 TABLET BY MOUTH THREE TIMES DAILY AS NEEDED FOR MUSCLE SPASMS 270 tablet 1   estradiol (ESTRACE) 1 MG tablet Take 1 tablet (1 mg total) by mouth daily. 90 tablet 3   Fluticasone-Umeclidin-Vilant (TRELEGY ELLIPTA) 100-62.5-25 MCG/ACT AEPB INHALE 1 PUFF INTO THE LUNGS DAILY 60 each 5   Probiotic Product (PROBIOTIC PO) Take by mouth daily.     zolpidem (AMBIEN) 10 MG tablet TAKE 1 TABLET(10 MG) BY MOUTH AT BEDTIME 30 tablet 5   albuterol (VENTOLIN HFA) 108 (90 Base) MCG/ACT inhaler Inhale 1-2 puffs into the lungs every 6 (six) hours as needed for wheezing or shortness of breath. (Patient not taking: Reported on 01/01/2023) 18 g 2   cetirizine (ZYRTEC) 10 MG tablet Take 10 mg by mouth daily as needed  for allergies (spring time).     denosumab (PROLIA) 60 MG/ML SOSY injection Inject 60 mg into the skin every 6 (six) months.     ipratropium-albuterol (DUONEB) 0.5-2.5 (3) MG/3ML SOLN Take 3 mLs by nebulization every 6 (six) hours as needed. (Patient not taking: Reported on 01/01/2023) 360 mL 11   terconazole (TERAZOL 7) 0.4 % vaginal cream Place 1 applicator vaginally at bedtime. 45 g 0   zolmitriptan (ZOMIG) 5 MG tablet TAKE 1 TABLET BY MOUTH AS NEEDED AS DIRECTED 30 tablet 1   Current Facility-Administered Medications  Medication Dose Route Frequency Provider Last Rate Last Admin   0.9 %  sodium chloride infusion  500 mL Intravenous Continuous Imogene Burn, MD        Allergies as of 02/11/2023 - Review Complete 02/11/2023  Allergen Reaction Noted   Divalproex sodium  03/28/2017   Nsaids  05/09/2007    Family History  Problem Relation Age of Onset   Fibrocystic breast disease Mother        lumpectomy in 50-60   Heart attack Father    Congestive Heart Failure Father    Diabetes type II Sister        and ? brother   Colon cancer Maternal Grandmother    Arthritis Son        psoriatic   Cancer Other        breast/fhx   Heart disease Other        fhx    Esophageal cancer Neg Hx    Rectal cancer Neg Hx    Stomach cancer Neg Hx    Colon polyps Neg Hx     Social History   Socioeconomic History   Marital status: Married    Spouse name: Not on file   Number of children: Not on file   Years of education: Not on file   Highest education level: Associate degree: academic program  Occupational History    Employer: [employer]  Tobacco Use   Smoking status: Former    Packs/day: 1.00    Years: 20.00    Additional pack years: 0.00    Total pack years: 20.00    Types: Cigarettes    Quit date: 04/02/2013    Years since quitting: 9.8    Passive exposure: Never   Smokeless tobacco: Never  Vaping Use   Vaping Use: Never used  Substance and Sexual Activity   Alcohol use: Yes    Alcohol/week: 1.0 standard drink of alcohol    Types: 1 Glasses of wine per week    Comment: occ   Drug use: Never   Sexual activity: Never    Partners: Male  Other Topics Concern   Not on file  Social History Narrative   Not on file   Social Determinants of Health   Financial Resource Strain: Low Risk  (02/23/2022)   Overall Financial Resource Strain (CARDIA)    Difficulty of Paying Living Expenses: Not hard at all  Food Insecurity: No Food Insecurity (02/23/2022)   Hunger Vital Sign    Worried About Running Out of Food in the Last Year: Never true    Ran Out of Food in the Last Year: Never true  Transportation Needs: No Transportation Needs (02/23/2022)   PRAPARE - Administrator, Civil Service (Medical): No    Lack of Transportation (Non-Medical): No  Physical Activity: Insufficiently Active (02/23/2022)   Exercise Vital Sign    Days of Exercise per Week: 4 days  Minutes of Exercise per Session: 10 min  Stress: No Stress Concern Present (02/23/2022)   [employer] of Occupational Health - Occupational Stress Questionnaire    Feeling of Stress : Only a little  Social Connections: Moderately Isolated (02/23/2022)   Social  Connection and Isolation Panel [NHANES]    Frequency of Communication with Friends and Family: More than three times a week    Frequency of Social Gatherings with Friends and Family: Three times a week    Attends Religious Services: Never    Active Member of Clubs or Organizations: No    Attends Engineer, structural: Not on file    Marital Status: Married  Catering manager Violence: Not on file    Physical Exam: Vital signs in last 24 hours: BP 116/77   Pulse 87   Temp (!) 97.5 F (36.4 C)   Ht 5' 4.5" (1.638 m)   Wt 104 lb (47.2 kg)   LMP 09/03/2000 (Approximate)   SpO2 93%   BMI 17.58 kg/m  GEN: NAD EYE: Sclerae anicteric ENT: MMM CV: Non-tachycardic Pulm: No increased WOB GI: Soft NEURO:  Alert & Oriented   Eulah Pont, MD [facility]   02/11/2023 8:42 AM

## 2023-02-11 NOTE — Op Note (Addendum)
[facility] Patient Name: Emily Ward Procedure Date: 02/11/2023 9:24 AM MRN: [id number] Endoscopist: Madelyn Brunner [place] , , [id number] Age: 67 Referring MD:  Date of Birth: 09-22-1955 Gender: Female Account #: [id number] Procedure:                Colonoscopy Indications:              Surveillance: Personal history of piecemeal removal                            of large sessile adenoma on last colonoscopy 6                            months ago Medicines:                Monitored Anesthesia Care Procedure:                Pre-Anesthesia Assessment:                           - Prior to the procedure, a History and Physical                            was performed, and patient medications and                            allergies were reviewed. The patient's tolerance of                            previous anesthesia was also reviewed. The risks                            and benefits of the procedure and the sedation                            options and risks were discussed with the patient.                            All questions were answered, and informed consent                            was obtained. Prior Anticoagulants: The patient has                            taken no anticoagulant or antiplatelet agents. ASA                            Grade Assessment: II - A patient with mild systemic                            disease. After reviewing the risks and benefits,                            the patient was deemed in satisfactory condition to  undergo the procedure.                           After obtaining informed consent, the colonoscope                            was passed under direct vision. Throughout the                            procedure, the patient's blood pressure, pulse, and                            oxygen saturations were monitored continuously. The                            [id number]-HQ190L Colonoscope [id number] was  introduced                            through the anus and advanced to the the cecum,                            identified by appendiceal orifice and ileocecal                            valve. The colonoscopy was performed without                            difficulty. The patient tolerated the procedure                            well. The quality of the bowel preparation was                            good. The ileocecal valve, appendiceal orifice, and                            rectum were photographed. Scope In: 9:29:59 AM Scope Out: 9:55:23 AM Scope Withdrawal Time: 0 hours 19 minutes 14 seconds  Total Procedure Duration: 0 hours 25 minutes 24 seconds  Findings:                 Two sessile polyps were found in the transverse                            colon and cecum. The polyps were 4 to 6 mm in size.                            These polyps were removed with a cold snare.                            Resection and retrieval were complete.                           Diverticula were found in the sigmoid colon.  Non-bleeding internal hemorrhoids were found during                            retroflexion. Complications:            No immediate complications. Estimated Blood Loss:     Estimated blood loss was minimal. Impression:               - Two 4 to 6 mm polyps in the transverse colon and                            in the cecum, removed with a cold snare. Resected                            and retrieved.                           - Diverticulosis in the sigmoid colon.                           - Non-bleeding internal hemorrhoids. Recommendation:           - Discharge patient to home (with escort).                           - Repeat colonoscopy in 1 year for surveillance due                            to history of large polyp removal.                           - Await pathology results.                           - Bentyl 10 mg TID PRN for abdominal pain.                            - The findings and recommendations were discussed                            with the patient. Dr Particia Lather "[place]" [name],  02/11/2023 10:05:59 AM

## 2023-02-12 ENCOUNTER — Telehealth: Payer: Self-pay

## 2023-02-12 ENCOUNTER — Telehealth: Payer: Self-pay | Admitting: Pharmacist

## 2023-02-12 ENCOUNTER — Other Ambulatory Visit (INDEPENDENT_AMBULATORY_CARE_PROVIDER_SITE_OTHER): Payer: 59

## 2023-02-12 DIAGNOSIS — M810 Age-related osteoporosis without current pathological fracture: Secondary | ICD-10-CM | POA: Diagnosis not present

## 2023-02-12 DIAGNOSIS — Z79899 Other long term (current) drug therapy: Secondary | ICD-10-CM

## 2023-02-12 DIAGNOSIS — E559 Vitamin D deficiency, unspecified: Secondary | ICD-10-CM

## 2023-02-12 LAB — COMPREHENSIVE METABOLIC PANEL
ALT: 8 U/L (ref 0–35)
AST: 19 U/L (ref 0–37)
Albumin: 4.4 g/dL (ref 3.5–5.2)
Alkaline Phosphatase: 73 U/L (ref 39–117)
BUN: 10 mg/dL (ref 6–23)
CO2: 27 mEq/L (ref 19–32)
Calcium: 9.5 mg/dL (ref 8.4–10.5)
Chloride: 107 mEq/L (ref 96–112)
Creatinine, Ser: 0.58 mg/dL (ref 0.40–1.20)
GFR: 95.6 mL/min (ref >60.00)
Glucose, Bld: 98 mg/dL (ref 70–99)
Potassium: 3.8 mEq/L (ref 3.5–5.1)
Sodium: 140 mEq/L (ref 135–145)
Total Bilirubin: 0.3 mg/dL (ref 0.2–1.2)
Total Protein: 7.2 g/dL (ref 6.0–8.3)

## 2023-02-12 LAB — VITAMIN D 25 HYDROXY (VIT D DEFICIENCY, FRACTURES): VITD: 65.63 ng/mL (ref 30.00–100.00)

## 2023-02-12 NOTE — Telephone Encounter (Signed)
Patient due for Prolia on 02/03/23. She is already scheduled at [facility] on 02/13/23 but do need updated CMET and Vitamin D  VM left with pt today  Chesley Mires, PharmD, MPH, BCPS, CPP Clinical Pharmacist (Rheumatology and Pulmonology)

## 2023-02-12 NOTE — Telephone Encounter (Signed)
Attempted follow up call post colonoscopy; no answer; left message. 

## 2023-02-13 ENCOUNTER — Ambulatory Visit (INDEPENDENT_AMBULATORY_CARE_PROVIDER_SITE_OTHER): Payer: 59

## 2023-02-13 VITALS — BP 112/70 | HR 79 | Temp 97.7°F | Resp 16 | Ht 65.0 in | Wt 105.6 lb

## 2023-02-13 DIAGNOSIS — M810 Age-related osteoporosis without current pathological fracture: Secondary | ICD-10-CM

## 2023-02-13 MED ORDER — DENOSUMAB 60 MG/ML ~~LOC~~ SOSY
60.0000 mg | PREFILLED_SYRINGE | Freq: Once | SUBCUTANEOUS | Status: AC
  Administered 2023-02-13: 60 mg via SUBCUTANEOUS
  Filled 2023-02-13: qty 1

## 2023-02-13 NOTE — Progress Notes (Signed)
Diagnosis: Osteoporosis  Provider:  Chilton Greathouse MD  Procedure: Injection  Prolia (Denosumab), Dose: 60 mg, Site: subcutaneous, Number of injections: 1  Post Care:  n/a  Discharge: Condition: Good, Destination: Home . AVS Declined  Performed by:  Loney Hering, LPN

## 2023-02-13 NOTE — Telephone Encounter (Addendum)
Labs for Prolia completed at [facility]. WNL -ok to proceed with Prolia  Chesley Mires, PharmD, MPH, BCPS, CPP Clinical Pharmacist (Rheumatology and Pulmonology)

## 2023-02-14 ENCOUNTER — Encounter: Payer: Self-pay | Admitting: Internal Medicine

## 2023-02-18 ENCOUNTER — Encounter: Payer: Self-pay | Admitting: Physical Therapy

## 2023-02-18 ENCOUNTER — Ambulatory Visit: Payer: 59 | Attending: Urology | Admitting: Physical Therapy

## 2023-02-18 DIAGNOSIS — R279 Unspecified lack of coordination: Secondary | ICD-10-CM | POA: Insufficient documentation

## 2023-02-18 DIAGNOSIS — M6281 Muscle weakness (generalized): Secondary | ICD-10-CM | POA: Insufficient documentation

## 2023-02-18 NOTE — Therapy (Signed)
OUTPATIENT PHYSICAL THERAPY TREATMENT NOTE   Patient Name: Emily Ward MRN: [id number] DOB:Feb 24, 1956, 67 y.o., female Today's Date: 02/18/2023  PCP: Gershon Crane, MD  REFERRING PROVIDER: Crist Fat, MD   END OF SESSION:   PT End of Session - 02/18/23 1402     Visit Number 4    Date for PT Re-Evaluation 03/20/23    Authorization Type [employer]    PT Start Time 1400    PT Stop Time 1440    PT Time Calculation (min) 40 min    Activity Tolerance Patient tolerated treatment well    Behavior During Therapy [facility] for tasks assessed/performed             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     Fibromyalgia    sees Dr. 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 Norwood Levo, adenmatous polyp, repeat in 6 months   CYSTOSCOPY N/A 09/01/2018   Procedure: CYSTOSCOPY;  Surgeon: Jerene Bears, MD;  Location: [facility];  Service: Gynecology;  Laterality: N/A;   DILATATION & CURETTAGE/HYSTEROSCOPY WITH MYOSURE N/A 04/12/2017   Procedure: DILATATION & CURETTAGE/HYSTEROSCOPY;  Surgeon: Jerene Bears, MD;  Location: [facility];  Service: Gynecology;  Laterality: N/A;    epidural thoracic spine injection  06/07/2022   facet joint block  03/30/2021   per patient, performed by Dr. Alvester Morin   facet joint block  01/31/2022   per patient, performed by Dr. Alvester Morin   HYSTEROSCOPY     years ago with Dr Arelia Sneddon-? 2000   KNEE ARTHROSCOPY Left 10/24/2022   TOTAL LAPAROSCOPIC HYSTERECTOMY WITH SALPINGECTOMY Bilateral 09/01/2018   Procedure: TOTAL LAPAROSCOPIC HYSTERECTOMY WITH SALPINGECTOMY  BSO, REPAIR OF PERINEAL LACERATION;  Surgeon: Jerene Bears, MD;  Location: [facility];  Service: Gynecology;  Laterality: Bilateral;  possible BSO   URETHRAL DILATION     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   Palpitations 07/30/2017   Shortness of breath 07/30/2017   Hormone replacement therapy (HRT) 03/30/2017   Former smoker 03/30/2017   Other fatigue 06/30/2016   Insomnia 06/30/2016   DJD (degenerative joint disease), cervical 06/30/2016   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/2016   IBS (irritable bowel syndrome) 06/30/2016   Interstitial cystitis 06/30/2016   SHOULDER PAIN, RIGHT 09/05/2010  GERD 03/24/2009   SACROILIAC STRAIN 03/24/2009   Atypical chest pain 01/20/2008   HEMATURIA UNSPECIFIED 10/14/2007   FIBROMYALGIA 07/03/2007   HYPERLIPIDEMIA NEC/NOS 05/16/2007   HEADACHE 05/09/2007    REFERRING DIAG: N30.20 (ICD-10-CM) - Other chronic cystitis without hematuria    THERAPY DIAG:  Unspecified lack of coordination   Muscle weakness (generalized)   Rationale for Evaluation and Treatment: Rehabilitation   ONSET DATE: 04/2022   SUBJECTIVE:                                                                                                                                                                                             SUBJECTIVE STATEMENT: I had a UTI again. Patient does not feel the need to constantly urinate. Yesterday I did not go to the bathroom right away and when got up leaked a little urine.      PAIN:  Are you having pain? No   PRECAUTIONS: None   WEIGHT BEARING RESTRICTIONS: No   FALLS:  Has patient fallen in last 6 months? No   LIVING ENVIRONMENT: Lives with: lives with their spouse   OCCUPATION: retired   PLOF: Independent   PATIENT GOALS: improve the urinary leakage   PERTINENT HISTORY:  COPD; MVP; Osteoporosis; Cervical laminectomy 2006; cesarean section 1993; total laparoscopic hysterectomy with salpingectomy;    BOWEL MOVEMENT: Pain with bowel movement: No Type of bowel movement:Type (Bristol Stool Scale) Type 3, Frequency daily, and Strain No Fully empty rectum: Yes: but will have an occasional feeling of not fully empty Leakage: No     URINATION: Pain with urination: No Fully empty bladder: No Stream: Strong when she does not rush to bathroom frequently Urgency: Yes: sometimes Frequency: urinate every 2 hours and sometimes will go longer Leakage: Urge to void, Coughing, Sneezing, Laughing, Lifting, and lifting something while walking Pads: Yes: pantyliner 3-4 and change just for freshness   INTERCOURSE: Pain with intercourse: Initial Penetration, During Penetration, and Pain Interrupts Intercourse Ability to have vaginal penetration:  Yes:   Marinoff Scale: 3/3   PREGNANCY: Vaginal deliveries 1 Tearing Yes: not sure how much C-section deliveries 1     PROLAPSE: None     OBJECTIVE:    DIAGNOSTIC FINDINGS:  none     COGNITION: Overall cognitive status: Within functional limits for tasks assessed                                POSTURE: No Significant postural limitations   PELVIC ALIGNMENT: in correct alignment     LOWER EXTREMITY ROM: bilateral hip ROM  is full     LOWER EXTREMITY MMT:    MMT Right eval Left eval  Hip extension 4/5 4/5  Hip abduction 4/5 4/5  Hip adduction 4/5 4/5    PALPATION:   General  tenderness and tightness in the lower abdomen, good movement of the lower rib cage.                  External Perineal Exam intact                             Internal Pelvic Floor tenderness located on the left levator ani and obturator internist, restrictions on the urethra, tightness along the introitus   Patient confirms identification and approves PT to assess internal pelvic floor and treatment Yes   PELVIC MMT:   MMT eval 02/18/23  Vaginal 2/5 ant and post.  1/5 sides Started as 2/5 then at end had the smallest lift when she breathed out  (Blank rows = not tested)         TONE: Increased at entrance   PROLAPSE: none   TODAY'S TREATMENT:   02/18/23 Manual: Myofascial release: Release of the urogenital diaphragm going through the layers of the fascia Internal pelvic floor techniques: No emotional/communication barriers or cognitive limitation. Patient is motivated to learn. Patient understands and agrees with treatment goals and plan. PT explains patient will be examined in standing, sitting, and lying down to see how their muscles and joints work. When they are ready, they will be asked to remove their underwear so PT can examine their perineum. The patient is also given the option of providing their own chaperone as one is not provided in our facility. The patient also has the right and is explained the right to defer or refuse any part of the evaluation or treatment including the internal exam. With the patient's consent, PT will use one gloved finger to gently assess the muscles of the pelvic floor, seeing how well it contracts and relaxes and if there is muscle symmetry. After, the patient will get dressed and PT and patient will discuss exam findings and plan of care. PT and patient discuss plan of care, schedule, attendance policy and HEP activities.   Going through the vagina working on the introitus, along the levator ani and obturator internist Neuromuscular re-education: Pelvic floor contraction training: Tactile cues to the pelvic floor to work on circular contraction with therapist finger in the vaginal canal.  Exercises: Stretches/mobility: Strengthening: Supine pelvic floor contraction holding for 10 sec 10x Hip flexion isometric holding 5 sec 10 x each leg with pelvic floor contraction Supine pelvic floor contraction holding 10 sec 10 x followed by 5 quick flicks  01/16/23 Neuromuscular re-education: Pelvic floor contraction training: Using the RUSI to work on aberrant movement of bulging the pelvic floor instead of contraction to reduce urinary leakage. She will bulge her abdomen and pelvic floor with contraction. Patient was able to demonstrate a pelvic floor contraction with lift by the end of the treatment. She was able to bulge the pelvic floor very well with good excursion.  Down training: Diaphragmatic breathing in sitting with hands on the lateral rib cage then feeling the pelvic floor drop      12/31/22 Manual: Soft tissue mobilization: Manual work to bilateral hip adductors Myofascial release: Release of the urogenital diaphragm Internal pelvic floor techniques: No emotional/communication barriers or cognitive limitation. Patient is motivated to learn. Patient understands and agrees with  treatment goals and plan. PT explains patient will be examined in standing, sitting, and lying down to see how their muscles and joints work. When they are ready, they will be asked to remove their underwear so PT can examine their perineum. The patient is also given the option of providing their own chaperone as one is not provided in our facility. The patient also has the right and is explained the right to defer or refuse any part of the evaluation or treatment including the internal exam. With the patient's consent, PT will use one  gloved finger to gently assess the muscles of the pelvic floor, seeing how well it contracts and relaxes and if there is muscle symmetry. After, the patient will get dressed and PT and patient will discuss exam findings and plan of care. PT and patient discuss plan of care, schedule, attendance policy and HEP activities. Going through the vaginal canal working on the introitus, along the puborectalis  with one finger internally and other externally Exercises: Stretches/mobility: Sit on small ball and massage the pelvic floor Diaphragmatic breathing to elongate the pelvic floor.                                                                                                                                  PATIENT EDUCATION:  02/18/23 Education details: Access Code: [email or web address] Person educated: Patient Education method: Explanation, Demonstration, Tactile cues, Verbal cues, and Handouts Education comprehension: verbalized understanding, returned demonstration, verbal cues required, tactile cues required, and needs further education   HOME EXERCISE PROGRAM: 02/1723 Access Code: [email or web address] URL: [email or web address] Date: 02/18/2023 Prepared by: Eulis Foster  Exercises - Supine Diaphragmatic Breathing  - 1 x daily - 7 x weekly - 1 sets - 10 reps - Seated Pelvic Floor Muscles Isometrics on Towel Roll  - 1 x daily - 7 x weekly - 1 sets - 10 reps - 5-8s holds - Quadruped Transversus Abdominis Bracing  - 1 x daily - 7 x weekly - 1 sets - 10 reps - Cat Cow  - 1 x daily - 7 x weekly - 1 sets - 10 reps - Sidelying Thoracic Rotation with Open Book  - 1 x daily - 7 x weekly - 1 sets - 2-3 reps - 30-45s holds - Supine Pelvic Floor Contraction  - 3 x daily - 7 x weekly - 10 reps - 5 sec hold - Quick Flick Pelvic Floor Contractions in Hooklying  - 1 x daily - 7 x weekly - 1 sets - 5 reps - Hooklying Isometric Hip Flexion  - 1 x daily - 7 x weekly - 1 sets - 10 reps   ASSESSMENT:   CLINICAL  IMPRESSION: Patient is a 67 y.o. female who was seen today for physical therapy  treatment for chronic cystitis without hematuria.  Patient is not changing pads as often and wearing 2-3 times per day instead of 4. Patient does not have the fear  of leaking as much. Patient does not have the constant need to urinate.  Patient will benefit from skilled therapy to improve pelvic floor coordination to reduce leakage.    OBJECTIVE IMPAIRMENTS: decreased activity tolerance, decreased endurance, decreased strength, increased fascial restrictions, and increased muscle spasms.    ACTIVITY LIMITATIONS: continence and locomotion level   PARTICIPATION LIMITATIONS: shopping and community activity   PERSONAL FACTORS: 3+ comorbidities: COPD; MVP; Osteoporosis; Cervical laminectomy 2006; cesarean section 1993; total laparoscopic hysterectomy with salpingectomy;   are also affecting patient's functional outcome.    REHAB POTENTIAL: Excellent   CLINICAL DECISION MAKING: Stable/uncomplicated   EVALUATION COMPLEXITY: Low     GOALS: Goals reviewed with patient? Yes   SHORT TERM GOALS: Target date: 01/18/23   Patient independent with initial HEP for diaphragmatic breathing with pelvic floor drop.  Baseline: Goal status: Met 01/16/23   2.  Patient is independent with perineal massage to lengthen tissue.  Baseline:  Goal status: INITIAL       LONG TERM GOALS: Target date: 03/20/23   Patient independent with advanced HEP for core and pelvic floor strength.  Baseline:  Goal status: INITIAL   2.  Patient pelvic floor strength is >/= 3/5 with circular hug of therapist finger.  Baseline:  Goal status: ongoing 01/16/23   3.  Patient is able to cough, sneeze and laugh with 75% less urinary leakage.  Baseline:  Goal status: ongoing 01/16/23   4.  Patient is able to lift 10 #  and walk without leaking urine due to improved contractions.  Baseline:  Goal status: INITIAL     PLAN:   PT FREQUENCY:  1x/week   PT DURATION: 12 weeks   PLANNED INTERVENTIONS: Therapeutic exercises, Therapeutic activity, Neuromuscular re-education, Patient/Family education, Dry Needling, Electrical stimulation, Biofeedback, and Manual therapy   PLAN FOR NEXT SESSION: educate on perineal massage, ask about the leakage, work on hip adduction with pelvic floor contraction   Eulis Foster, PT 02/18/23 2:43 PM

## 2023-02-22 ENCOUNTER — Ambulatory Visit: Payer: 59 | Admitting: Family Medicine

## 2023-02-22 ENCOUNTER — Encounter: Payer: Self-pay | Admitting: Family Medicine

## 2023-02-22 VITALS — BP 98/72 | HR 87 | Temp 97.8°F | Wt 103.8 lb

## 2023-02-22 DIAGNOSIS — N390 Urinary tract infection, site not specified: Secondary | ICD-10-CM | POA: Diagnosis not present

## 2023-02-22 LAB — POC URINALSYSI DIPSTICK (AUTOMATED)
Bilirubin, UA: NEGATIVE
Glucose, UA: NEGATIVE
Ketones, UA: NEGATIVE
Nitrite, UA: NEGATIVE
Protein, UA: POSITIVE — AB
Spec Grav, UA: 1.01 (ref 1.010–1.025)
Urobilinogen, UA: 0.2 E.U./dL
pH, UA: 5 (ref 5.0–8.0)

## 2023-02-22 MED ORDER — PHENAZOPYRIDINE HCL 100 MG PO TABS: 100.0000 mg | ORAL_TABLET | Freq: Three times a day (TID) | ORAL | 5 refills | Status: DC | PRN

## 2023-02-22 MED ORDER — CEPHALEXIN 250 MG PO CAPS: 250.0000 mg | ORAL_CAPSULE | Freq: Every day | ORAL | 3 refills | Status: DC

## 2023-02-22 MED ORDER — CEPHALEXIN 500 MG PO CAPS: 500.0000 mg | ORAL_CAPSULE | Freq: Three times a day (TID) | ORAL | 0 refills | Status: DC

## 2023-02-22 MED ORDER — FLUCONAZOLE 150 MG PO TABS
150.0000 mg | ORAL_TABLET | Freq: Every day | ORAL | 2 refills | Status: DC | PRN
Start: 2023-02-22 — End: 2023-07-03

## 2023-02-22 NOTE — Progress Notes (Signed)
   Subjective:    Patient ID: Emily Ward, female    DOB: [birth year], 67 y.o.   MRN: [id number]  HPI Here for another UTI. She had the onset yesterday of urinary urgency and burning. No blood in the urine, no fever. Of note she went to an urgent care on 01-29-23 with UTI symptoms, and they gave her 5 days of Keflex 500 mg BID. No culture was done. This seemed to work and she felt fine until now. She drinks plenty of water. She has frequent UTI's and she saw Dr. Marlou Porch in March. He wanted to start her on daily Methenamine, but she never started it due to concern about possible side effects. She has never taken prophylactic antibiotics.    Review of Systems  Constitutional: Negative.   Respiratory: Negative.    Cardiovascular: Negative.   Gastrointestinal: Negative.   Genitourinary:  Positive for dysuria, frequency and urgency. Negative for flank pain and hematuria.       Objective:   Physical Exam Constitutional:      Appearance: Normal appearance. She is not ill-appearing.  Cardiovascular:     Rate and Rhythm: Normal rate and regular rhythm.     Pulses: Normal pulses.     Heart sounds: Normal heart sounds.  Pulmonary:     Effort: Pulmonary effort is normal.     Breath sounds: Normal breath sounds.  Abdominal:     Tenderness: There is no right CVA tenderness or left CVA tenderness.  Neurological:     Mental Status: She is alert.           Assessment & Plan:  UTI, treat with 7 days of Keflex 500 mg TID. We will culture the sample. After these 7 days, she will begin prophylaxis with Keflex 250 mg every day.  Gershon Crane, MD

## 2023-02-23 LAB — URINE CULTURE
MICRO NUMBER:: 15112608
SPECIMEN QUALITY:: ADEQUATE

## 2023-02-27 ENCOUNTER — Ambulatory Visit: Payer: 59 | Admitting: Physical Therapy

## 2023-02-27 ENCOUNTER — Encounter: Payer: Self-pay | Admitting: Physical Therapy

## 2023-02-27 DIAGNOSIS — R279 Unspecified lack of coordination: Secondary | ICD-10-CM

## 2023-02-27 DIAGNOSIS — M6281 Muscle weakness (generalized): Secondary | ICD-10-CM

## 2023-02-27 NOTE — Therapy (Signed)
OUTPATIENT PHYSICAL THERAPY TREATMENT NOTE   Patient Name: Emily Ward MRN: [id number] DOB:December 17, 1955, 67 y.o., female Today's Date: 02/27/2023  PCP: Gershon Crane, MD  REFERRING PROVIDER: Crist Fat, MD   END OF SESSION:   PT End of Session - 02/27/23 1404     Visit Number 5    Date for PT Re-Evaluation 03/20/23    Authorization Type [employer]    PT Start Time 1400    PT Stop Time 1440    PT Time Calculation (min) 40 min    Activity Tolerance Patient tolerated treatment well    Behavior During Therapy [facility] for tasks assessed/performed             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     Fibromyalgia    sees Dr. 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 Norwood Levo, adenmatous polyp, repeat in 6 months   CYSTOSCOPY N/A 09/01/2018   Procedure: CYSTOSCOPY;  Surgeon: Jerene Bears, MD;  Location: [facility];  Service: Gynecology;  Laterality: N/A;   DILATATION & CURETTAGE/HYSTEROSCOPY WITH MYOSURE N/A 04/12/2017   Procedure: DILATATION & CURETTAGE/HYSTEROSCOPY;  Surgeon: Jerene Bears, MD;  Location: [facility];  Service: Gynecology;  Laterality: N/A;    epidural thoracic spine injection  06/07/2022   facet joint block  03/30/2021   per patient, performed by Dr. Alvester Morin   facet joint block  01/31/2022   per patient, performed by Dr. Alvester Morin   HYSTEROSCOPY     years ago with Dr Arelia Sneddon-? 2000   KNEE ARTHROSCOPY Left 10/24/2022   TOTAL LAPAROSCOPIC HYSTERECTOMY WITH SALPINGECTOMY Bilateral 09/01/2018   Procedure: TOTAL LAPAROSCOPIC HYSTERECTOMY WITH SALPINGECTOMY  BSO, REPAIR OF PERINEAL LACERATION;  Surgeon: Jerene Bears, MD;  Location: [facility];  Service: Gynecology;  Laterality: Bilateral;  possible BSO   URETHRAL DILATION     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   Palpitations 07/30/2017   Shortness of breath 07/30/2017   Hormone replacement therapy (HRT) 03/30/2017   Former smoker 03/30/2017   Other fatigue 06/30/2016   Insomnia 06/30/2016   DJD (degenerative joint disease), cervical 06/30/2016   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/2016   IBS (irritable bowel syndrome) 06/30/2016   Interstitial cystitis 06/30/2016   SHOULDER PAIN, RIGHT 09/05/2010  GERD 03/24/2009   SACROILIAC STRAIN 03/24/2009   Atypical chest pain 01/20/2008   HEMATURIA UNSPECIFIED 10/14/2007   FIBROMYALGIA 07/03/2007   HYPERLIPIDEMIA NEC/NOS 05/16/2007   HEADACHE 05/09/2007    REFERRING DIAG: N30.20 (ICD-10-CM) - Other chronic cystitis without hematuria    THERAPY DIAG:  Unspecified lack of coordination   Muscle weakness (generalized)   Rationale for Evaluation and Treatment: Rehabilitation   ONSET DATE: 04/2022   SUBJECTIVE:                                                                                                                                                                                             SUBJECTIVE STATEMENT: I saw my urologist yesterday. He ordered valium to be placed in vaginally to relax the spasms.    PAIN:  Are you having pain? No   PRECAUTIONS: None   WEIGHT BEARING RESTRICTIONS: No   FALLS:  Has patient fallen in last 6 months? No   LIVING ENVIRONMENT: Lives with: lives with their spouse   OCCUPATION: retired   PLOF: Independent   PATIENT GOALS: improve the urinary leakage   PERTINENT HISTORY:  COPD; MVP; Osteoporosis; Cervical laminectomy 2006; cesarean section 1993; total laparoscopic hysterectomy with salpingectomy;    BOWEL MOVEMENT: Pain with bowel movement: No Type of bowel movement:Type (Bristol Stool Scale) Type 3, Frequency daily, and Strain No Fully empty rectum: Yes: but will have an occasional feeling of not fully empty Leakage: No     URINATION: Pain with urination: No Fully empty bladder: No Stream: Strong when she does not rush to bathroom frequently Urgency: Yes: sometimes Frequency: urinate every 2 hours and sometimes will go longer Leakage: Urge to void, Coughing, Sneezing, Laughing, Lifting, and lifting something while walking Pads: Yes: pantyliner 3-4 and change just for freshness   INTERCOURSE: Pain with intercourse: Initial Penetration, During Penetration, and Pain Interrupts Intercourse Ability to have vaginal penetration:  Yes:   Marinoff Scale: 3/3   PREGNANCY: Vaginal deliveries 1 Tearing Yes: not sure how much C-section deliveries 1     PROLAPSE: None     OBJECTIVE:    DIAGNOSTIC FINDINGS:  none     COGNITION: Overall cognitive status: Within functional limits for tasks assessed                                POSTURE: No Significant postural limitations   PELVIC ALIGNMENT: in correct alignment     LOWER EXTREMITY ROM: bilateral hip ROM is full     LOWER EXTREMITY MMT:   MMT Right eval Left eval  Hip extension 4/5 4/5  Hip abduction 4/5  4/5  Hip adduction 4/5 4/5    PALPATION:   General  tenderness and tightness in the lower abdomen, good movement of the lower rib cage.                  External Perineal Exam intact                             Internal Pelvic Floor tenderness located on the left levator ani and obturator internist, restrictions on the urethra, tightness along the introitus   Patient confirms identification and approves PT to assess internal pelvic floor and treatment Yes   PELVIC MMT:   MMT eval 02/18/23  Vaginal 2/5 ant and post.  1/5 sides Started as 2/5 then at end had the smallest lift when she breathed out  (Blank rows = not tested)         TONE: Increased at entrance   PROLAPSE: none   TODAY'S TREATMENT:   02/27/23 Manual: Myofascial release: Fascial release along the urogenital diaphragm one side at a time.  Neuromuscular re-education: Down training: Diaphragmatic breathing while sitting on physioball with tactile cues to bring the air into the lower rib cage and let the pelvic floor drop Sitting on physioball with pelvic tilt, sway, circles and figure 8 Exercises: Stretches/mobility: Discussed with patient on how to apply the estrace cream into the vaginal canal and massage it into the tissue Quadruped with therapist putting pressure on the anococcygeal ligament to release the posterior pelvic floor and patient moves forward backward and diagonals  02/18/23 Manual: Myofascial release: Release of the urogenital diaphragm going through the layers of the fascia Internal pelvic floor techniques: No emotional/communication barriers or cognitive limitation. Patient is motivated to learn. Patient understands and agrees with treatment goals and plan. PT explains patient will be examined in standing, sitting, and lying down to see how their muscles and joints work. When they are ready, they will be asked to remove their underwear so PT can examine their perineum. The patient is also given  the option of providing their own chaperone as one is not provided in our facility. The patient also has the right and is explained the right to defer or refuse any part of the evaluation or treatment including the internal exam. With the patient's consent, PT will use one gloved finger to gently assess the muscles of the pelvic floor, seeing how well it contracts and relaxes and if there is muscle symmetry. After, the patient will get dressed and PT and patient will discuss exam findings and plan of care. PT and patient discuss plan of care, schedule, attendance policy and HEP activities.  Going through the vagina working on the introitus, along the levator ani and obturator internist Neuromuscular re-education: Pelvic floor contraction training: Tactile cues to the pelvic floor to work on circular contraction with therapist finger in the vaginal canal.  Exercises: Strengthening: Supine pelvic floor contraction holding for 10 sec 10x Hip flexion isometric holding 5 sec 10 x each leg with pelvic floor contraction Supine pelvic floor contraction holding 10 sec 10 x followed by 5 quick flicks  01/16/23 Neuromuscular re-education: Pelvic floor contraction training: Using the RUSI to work on aberrant movement of bulging the pelvic floor instead of contraction to reduce urinary leakage. She will bulge her abdomen and pelvic floor with contraction. Patient was able to demonstrate a pelvic floor contraction with lift  by the end of the treatment. She was able to bulge the pelvic floor very well with good excursion.  Down training: Diaphragmatic breathing in sitting with hands on the lateral rib cage then feeling the pelvic floor drop        PATIENT EDUCATION:  02/18/23 Education details: Access Code: [email or web address] Person educated: Patient Education method: Explanation, Demonstration, Tactile cues, Verbal cues, and Handouts Education comprehension: verbalized understanding, returned demonstration, verbal  cues required, tactile cues required, and needs further education   HOME EXERCISE PROGRAM: 02/1723 Access Code: [email or web address] URL: [email or web address] Date: 02/18/2023 Prepared by: Eulis Foster  Exercises - Supine Diaphragmatic Breathing  - 1 x daily - 7 x weekly - 1 sets - 10 reps - Seated Pelvic Floor Muscles Isometrics on Towel Roll  - 1 x daily - 7 x weekly - 1 sets - 10 reps - 5-8s holds - Quadruped Transversus Abdominis Bracing  - 1 x daily - 7 x weekly - 1 sets - 10 reps - Cat Cow  - 1 x daily - 7 x weekly - 1 sets - 10 reps - Sidelying Thoracic Rotation with Open Book  - 1 x daily - 7 x weekly - 1 sets - 2-3 reps - 30-45s holds - Supine Pelvic Floor Contraction  - 3 x daily - 7 x weekly - 10 reps - 5 sec hold - Quick Flick Pelvic Floor Contractions in Hooklying  - 1 x daily - 7 x weekly - 1 sets - 5 reps - Hooklying Isometric Hip Flexion  - 1 x daily - 7 x weekly - 1 sets - 10 reps   ASSESSMENT:   CLINICAL IMPRESSION: Patient is a 67 y.o. female who was seen today for physical therapy  treatment for chronic cystitis without hematuria.  Patient focused on relaxation of the pelvic floor and how to do it at home with reverse kegel, sitting on ball to massage pelvic floor and meditation. Patient will be using the estrace cream regularly to improve the health of the tissue and see if it reduces the irritation of  the urethra.  Patient will benefit from skilled therapy to improve pelvic floor coordination to reduce leakage.    OBJECTIVE IMPAIRMENTS: decreased activity tolerance, decreased endurance, decreased strength, increased fascial restrictions, and increased muscle spasms.    ACTIVITY LIMITATIONS: continence and locomotion level   PARTICIPATION LIMITATIONS: shopping and community activity   PERSONAL FACTORS: 3+ comorbidities: COPD; MVP; Osteoporosis; Cervical laminectomy 2006; cesarean section 1993; total laparoscopic hysterectomy with salpingectomy;   are also  affecting patient's functional outcome.    REHAB POTENTIAL: Excellent   CLINICAL DECISION MAKING: Stable/uncomplicated   EVALUATION COMPLEXITY: Low     GOALS: Goals reviewed with patient? Yes   SHORT TERM GOALS: Target date: 01/18/23   Patient independent with initial HEP for diaphragmatic breathing with pelvic floor drop.  Baseline: Goal status: Met 01/16/23   2.  Patient is independent with perineal massage to lengthen tissue.  Baseline:  Goal status: Met 02/27/23       LONG TERM GOALS: Target date: 03/20/23   Patient independent with advanced HEP for core and pelvic floor strength.  Baseline:  Goal status: INITIAL   2.  Patient pelvic floor strength is >/= 3/5 with circular hug of therapist finger.  Baseline:  Goal status: ongoing 01/16/23   3.  Patient is able to cough, sneeze and laugh with 75% less urinary leakage.  Baseline:  Goal status: ongoing 01/16/23   4.  Patient is able to  lift 10 #  and walk without leaking urine due to improved contractions.  Baseline:  Goal status: INITIAL     PLAN:   PT FREQUENCY: 1x/week   PT DURATION: 12 weeks   PLANNED INTERVENTIONS: Therapeutic exercises, Therapeutic activity, Neuromuscular re-education, Patient/Family education, Dry Needling, Electrical stimulation, Biofeedback, and Manual therapy   PLAN FOR NEXT SESSION: ask about the leakage, manual work around the introitus and discuss dry needling to pelvic floor  Eulis Foster, PT 02/27/23 2:45 PM

## 2023-03-06 ENCOUNTER — Encounter: Payer: Self-pay | Admitting: Physical Therapy

## 2023-03-06 ENCOUNTER — Ambulatory Visit: Payer: 59 | Attending: Urology | Admitting: Physical Therapy

## 2023-03-06 DIAGNOSIS — M6281 Muscle weakness (generalized): Secondary | ICD-10-CM | POA: Diagnosis present

## 2023-03-06 DIAGNOSIS — R279 Unspecified lack of coordination: Secondary | ICD-10-CM | POA: Diagnosis present

## 2023-03-06 NOTE — Therapy (Signed)
OUTPATIENT PHYSICAL THERAPY TREATMENT NOTE   Patient Name: Emily Ward MRN: [id number] DOB:1956-01-17, 67 y.o., female Today's Date: 03/06/2023  PCP: Gershon Crane, MD  REFERRING PROVIDER: Crist Fat, MD   END OF SESSION:   PT End of Session - 03/06/23 1358     Visit Number 6    Date for PT Re-Evaluation 03/20/23    Authorization Type [employer]    PT Start Time 1400    PT Stop Time 1440    PT Time Calculation (min) 40 min    Activity Tolerance Patient tolerated treatment well    Behavior During Therapy [facility] for tasks assessed/performed             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     Fibromyalgia    sees Dr. 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 Norwood Levo, adenmatous polyp, repeat in 6 months   CYSTOSCOPY N/A 09/01/2018   Procedure: CYSTOSCOPY;  Surgeon: Jerene Bears, MD;  Location: [facility];  Service: Gynecology;  Laterality: N/A;   DILATATION & CURETTAGE/HYSTEROSCOPY WITH MYOSURE N/A 04/12/2017   Procedure: DILATATION & CURETTAGE/HYSTEROSCOPY;  Surgeon: Jerene Bears, MD;  Location: [facility];  Service: Gynecology;  Laterality: N/A;    epidural thoracic spine injection  06/07/2022   facet joint block  03/30/2021   per patient, performed by Dr. Alvester Morin   facet joint block  01/31/2022   per patient, performed by Dr. Alvester Morin   HYSTEROSCOPY     years ago with Dr Arelia Sneddon-? 2000   KNEE ARTHROSCOPY Left 10/24/2022   TOTAL LAPAROSCOPIC HYSTERECTOMY WITH SALPINGECTOMY Bilateral 09/01/2018   Procedure: TOTAL LAPAROSCOPIC HYSTERECTOMY WITH SALPINGECTOMY  BSO, REPAIR OF PERINEAL LACERATION;  Surgeon: Jerene Bears, MD;  Location: [facility];  Service: Gynecology;  Laterality: Bilateral;  possible BSO   URETHRAL DILATION     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   Palpitations 07/30/2017   Shortness of breath 07/30/2017   Hormone replacement therapy (HRT) 03/30/2017   Former smoker 03/30/2017   Other fatigue 06/30/2016   Insomnia 06/30/2016   DJD (degenerative joint disease), cervical 06/30/2016   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/2016   IBS (irritable bowel syndrome) 06/30/2016   Interstitial cystitis 06/30/2016   SHOULDER PAIN, RIGHT 09/05/2010  GERD 03/24/2009   SACROILIAC STRAIN 03/24/2009   Atypical chest pain 01/20/2008   HEMATURIA UNSPECIFIED 10/14/2007   FIBROMYALGIA 07/03/2007   HYPERLIPIDEMIA NEC/NOS 05/16/2007   HEADACHE 05/09/2007    REFERRING DIAG: N30.20 (ICD-10-CM) - Other chronic cystitis without hematuria    THERAPY DIAG:  Unspecified lack of coordination   Muscle weakness (generalized)   Rationale for Evaluation and Treatment: Rehabilitation   ONSET DATE: 04/2022   SUBJECTIVE:                                                                                                                                                                                             SUBJECTIVE STATEMENT: I did some good exercises on my ball. In general the irritation is better.      PAIN:  Are you having pain? No   PRECAUTIONS: None   WEIGHT BEARING RESTRICTIONS: No   FALLS:  Has patient fallen in last 6 months? No   LIVING ENVIRONMENT: Lives with: lives with their spouse   OCCUPATION: retired   PLOF: Independent   PATIENT GOALS: improve the urinary leakage   PERTINENT HISTORY:  COPD; MVP; Osteoporosis; Cervical laminectomy 2006; cesarean section 1993; total laparoscopic hysterectomy with salpingectomy;    BOWEL MOVEMENT: Pain with bowel movement: No Type of bowel movement:Type (Bristol Stool Scale) Type 3, Frequency daily, and Strain No Fully empty rectum: Yes: but will have an occasional feeling of not fully empty Leakage: No     URINATION: Pain with urination: No Fully empty bladder: No Stream: Strong when she does not rush to bathroom frequently Urgency: Yes: sometimes Frequency: urinate every 2 hours and sometimes will go longer Leakage: Urge to void, Coughing, Sneezing, Laughing, Lifting, and lifting something while walking Pads: Yes: pantyliner 3-4 and change just for freshness   INTERCOURSE: Pain with intercourse: Initial Penetration, During Penetration, and Pain Interrupts Intercourse Ability to have vaginal penetration:  Yes:   Marinoff Scale: 3/3   PREGNANCY: Vaginal deliveries 1 Tearing Yes: not sure how much C-section deliveries 1     PROLAPSE: None     OBJECTIVE:    DIAGNOSTIC FINDINGS:  none     COGNITION: Overall cognitive status: Within functional limits for tasks assessed                                POSTURE: No Significant postural limitations   PELVIC ALIGNMENT: in correct alignment     LOWER EXTREMITY ROM: bilateral hip ROM is full     LOWER EXTREMITY MMT:   MMT Right eval Left eval  Hip  extension 4/5 4/5  Hip abduction 4/5 4/5  Hip  adduction 4/5 4/5    PALPATION:   General  tenderness and tightness in the lower abdomen, good movement of the lower rib cage.                  External Perineal Exam intact                             Internal Pelvic Floor tenderness located on the left levator ani and obturator internist, restrictions on the urethra, tightness along the introitus   Patient confirms identification and approves PT to assess internal pelvic floor and treatment Yes   PELVIC MMT:   MMT eval 02/18/23  Vaginal 2/5 ant and post.  1/5 sides Started as 2/5 then at end had the smallest lift when she breathed out  (Blank rows = not tested)         TONE: Increased at entrance   PROLAPSE: none   TODAY'S TREATMENT:   03/06/23 Manual: Internal pelvic floor techniques: No emotional/communication barriers or cognitive limitation. Patient is motivated to learn. Patient understands and agrees with treatment goals and plan. PT explains patient will be examined in standing, sitting, and lying down to see how their muscles and joints work. When they are ready, they will be asked to remove their underwear so PT can examine their perineum. The patient is also given the option of providing their own chaperone as one is not provided in our facility. The patient also has the right and is explained the right to defer or refuse any part of the evaluation or treatment including the internal exam. With the patient's consent, PT will use one gloved finger to gently assess the muscles of the pelvic floor, seeing how well it contracts and relaxes and if there is muscle symmetry. After, the patient will get dressed and PT and patient will discuss exam findings and plan of care. PT and patient discuss plan of care, schedule, attendance policy and HEP activities.  Therapist performed myofascial release to the ischiocavernosus, bulbocavernosus, along the labia and urogenital diaphragm and clitoris monitoring for pain and  tightness  02/27/23 Manual: Myofascial release: Fascial release along the urogenital diaphragm one side at a time.  Neuromuscular re-education: Down training: Diaphragmatic breathing while sitting on physioball with tactile cues to bring the air into the lower rib cage and let the pelvic floor drop Sitting on physioball with pelvic tilt, sway, circles and figure 8 Exercises: Stretches/mobility: Discussed with patient on how to apply the estrace cream into the vaginal canal and massage it into the tissue Quadruped with therapist putting pressure on the anococcygeal ligament to release the posterior pelvic floor and patient moves forward backward and diagonals  02/18/23 Manual: Myofascial release: Release of the urogenital diaphragm going through the layers of the fascia Internal pelvic floor techniques: No emotional/communication barriers or cognitive limitation. Patient is motivated to learn. Patient understands and agrees with treatment goals and plan. PT explains patient will be examined in standing, sitting, and lying down to see how their muscles and joints work. When they are ready, they will be asked to remove their underwear so PT can examine their perineum. The patient is also given the option of providing their own chaperone as one is not provided in our facility. The patient also has the right and is explained the right to defer or refuse any part of the evaluation or treatment including  the internal exam. With the patient's consent, PT will use one gloved finger to gently assess the muscles of the pelvic floor, seeing how well it contracts and relaxes and if there is muscle symmetry. After, the patient will get dressed and PT and patient will discuss exam findings and plan of care. PT and patient discuss plan of care, schedule, attendance policy and HEP activities.  Going through the vagina working on the introitus, along the levator ani and obturator internist Neuromuscular  re-education: Pelvic floor contraction training: Tactile cues to the pelvic floor to work on circular contraction with therapist finger in the vaginal canal.  Exercises: Strengthening: Supine pelvic floor contraction holding for 10 sec 10x Hip flexion isometric holding 5 sec 10 x each leg with pelvic floor contraction Supine pelvic floor contraction holding 10 sec 10 x followed by 5 quick flicks      PATIENT EDUCATION:  02/18/23 Education details: Access Code: [email or web address] Person educated: Patient Education method: Programmer, multimedia, Demonstration, Actor cues, Verbal cues, and Handouts Education comprehension: verbalized understanding, returned demonstration, verbal cues required, tactile cues required, and needs further education   HOME EXERCISE PROGRAM: 02/1723 Access Code: [email or web address] URL: [email or web address] Date: 02/18/2023 Prepared by: Eulis Foster  Exercises - Supine Diaphragmatic Breathing  - 1 x daily - 7 x weekly - 1 sets - 10 reps - Seated Pelvic Floor Muscles Isometrics on Towel Roll  - 1 x daily - 7 x weekly - 1 sets - 10 reps - 5-8s holds - Quadruped Transversus Abdominis Bracing  - 1 x daily - 7 x weekly - 1 sets - 10 reps - Cat Cow  - 1 x daily - 7 x weekly - 1 sets - 10 reps - Sidelying Thoracic Rotation with Open Book  - 1 x daily - 7 x weekly - 1 sets - 2-3 reps - 30-45s holds - Supine Pelvic Floor Contraction  - 3 x daily - 7 x weekly - 10 reps - 5 sec hold - Quick Flick Pelvic Floor Contractions in Hooklying  - 1 x daily - 7 x weekly - 1 sets - 5 reps - Hooklying Isometric Hip Flexion  - 1 x daily - 7 x weekly - 1 sets - 10 reps   ASSESSMENT:   CLINICAL IMPRESSION: Patient is a 67 y.o. female who was seen today for physical therapy  treatment for chronic cystitis without hematuria.  The leakage is better. Patient has been more consistent with the estrace cream and able to relax more for urination. Patient is having less irritation in the urethral area.    Patient was had less tenderness located in the left side of the pelvic floor after manual work. Patient will benefit from skilled therapy to improve pelvic floor coordination to reduce leakage.    OBJECTIVE IMPAIRMENTS: decreased activity tolerance, decreased endurance, decreased strength, increased fascial restrictions, and increased muscle spasms.    ACTIVITY LIMITATIONS: continence and locomotion level   PARTICIPATION LIMITATIONS: shopping and community activity   PERSONAL FACTORS: 3+ comorbidities: COPD; MVP; Osteoporosis; Cervical laminectomy 2006; cesarean section 1993; total laparoscopic hysterectomy with salpingectomy;   are also affecting patient's functional outcome.    REHAB POTENTIAL: Excellent   CLINICAL DECISION MAKING: Stable/uncomplicated   EVALUATION COMPLEXITY: Low     GOALS: Goals reviewed with patient? Yes   SHORT TERM GOALS: Target date: 01/18/23   Patient independent with initial HEP for diaphragmatic breathing with pelvic floor drop.  Baseline: Goal status: Met 01/16/23   2.  Patient is independent with  perineal massage to lengthen tissue.  Baseline:  Goal status: Met 02/27/23       LONG TERM GOALS: Target date: 03/20/23   Patient independent with advanced HEP for core and pelvic floor strength.  Baseline:  Goal status: INITIAL   2.  Patient pelvic floor strength is >/= 3/5 with circular hug of therapist finger.  Baseline:  Goal status: ongoing 01/16/23   3.  Patient is able to cough, sneeze and laugh with 75% less urinary leakage.  Baseline:  Goal status: ongoing 01/16/23   4.  Patient is able to lift 10 #  and walk without leaking urine due to improved contractions.  Baseline:  Goal status: INITIAL     PLAN:   PT FREQUENCY: 1x/week   PT DURATION: 12 weeks   PLANNED INTERVENTIONS: Therapeutic exercises, Therapeutic activity, Neuromuscular re-education, Patient/Family education, Dry Needling, Electrical stimulation, Biofeedback, and Manual  therapy   PLAN FOR NEXT SESSION: ask about the leakage, manual work around the introitus on the right side;lifting  10 # ; and discuss dry needling to pelvic floor  Eulis Foster, PT 03/06/23 1:59 PM

## 2023-03-13 ENCOUNTER — Ambulatory Visit: Payer: 59 | Admitting: Physical Therapy

## 2023-03-13 ENCOUNTER — Encounter: Payer: Self-pay | Admitting: Physical Therapy

## 2023-03-13 DIAGNOSIS — R279 Unspecified lack of coordination: Secondary | ICD-10-CM

## 2023-03-13 DIAGNOSIS — M6281 Muscle weakness (generalized): Secondary | ICD-10-CM

## 2023-03-13 NOTE — Therapy (Signed)
OUTPATIENT PHYSICAL THERAPY TREATMENT NOTE   Patient Name: Emily Ward MRN: [id number] DOB:[birth year], 67 y.o., female Today's Date: 03/13/2023  PCP: Gershon Crane, MD  REFERRING PROVIDER: Crist Fat, MD   END OF SESSION:   PT End of Session - 03/13/23 1403     Visit Number 7    Date for PT Re-Evaluation 03/20/23    Authorization Type [employer]    PT Start Time 1400    PT Stop Time 1440    PT Time Calculation (min) 40 min    Activity Tolerance Patient tolerated treatment well    Behavior During Therapy [facility] for tasks assessed/performed             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     Fibromyalgia    sees Dr. 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 Norwood Levo, adenmatous polyp, repeat in 6 months   CYSTOSCOPY N/A 09/01/2018   Procedure: CYSTOSCOPY;  Surgeon: Jerene Bears, MD;  Location: [facility];  Service: Gynecology;  Laterality: N/A;   DILATATION & CURETTAGE/HYSTEROSCOPY WITH MYOSURE N/A 04/12/2017   Procedure: DILATATION & CURETTAGE/HYSTEROSCOPY;  Surgeon: Jerene Bears, MD;  Location: [facility];  Service: Gynecology;  Laterality: N/A;    epidural thoracic spine injection  06/07/2022   facet joint block  03/30/2021   per patient, performed by Dr. Alvester Morin   facet joint block  01/31/2022   per patient, performed by Dr. Alvester Morin   HYSTEROSCOPY     years ago with Dr Arelia Sneddon-? 2000   KNEE ARTHROSCOPY Left 10/24/2022   TOTAL LAPAROSCOPIC HYSTERECTOMY WITH SALPINGECTOMY Bilateral 09/01/2018   Procedure: TOTAL LAPAROSCOPIC HYSTERECTOMY WITH SALPINGECTOMY  BSO, REPAIR OF PERINEAL LACERATION;  Surgeon: Jerene Bears, MD;  Location: [facility];  Service: Gynecology;  Laterality: Bilateral;  possible BSO   URETHRAL DILATION     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   Palpitations 07/30/2017   Shortness of breath 07/30/2017   Hormone replacement therapy (HRT) 03/30/2017   Former smoker 03/30/2017   Other fatigue 06/30/2016   Insomnia 06/30/2016   DJD (degenerative joint disease), cervical 06/30/2016   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/2016   IBS (irritable bowel syndrome) 06/30/2016   Interstitial cystitis 06/30/2016   SHOULDER PAIN, RIGHT 09/05/2010  GERD 03/24/2009   SACROILIAC STRAIN 03/24/2009   Atypical chest pain 01/20/2008   HEMATURIA UNSPECIFIED 10/14/2007   FIBROMYALGIA 07/03/2007   HYPERLIPIDEMIA NEC/NOS 05/16/2007   HEADACHE 05/09/2007    REFERRING DIAG: N30.20 (ICD-10-CM) - Other chronic cystitis without hematuria    THERAPY DIAG:  Unspecified lack of coordination   Muscle weakness (generalized)   Rationale for Evaluation and Treatment: Rehabilitation   ONSET DATE: 04/2022   SUBJECTIVE:                                                                                                                                                                                             SUBJECTIVE STATEMENT: I do have some stress incontinence. I still wear a pad for just incase. The day to day incontinence is better. I feel more spasms in the urethral area. I am using the Estrace cream.       PAIN:  Are you having pain? No   PRECAUTIONS: None   WEIGHT BEARING RESTRICTIONS: No   FALLS:  Has patient fallen in last 6 months? No   LIVING ENVIRONMENT: Lives with: lives with their spouse   OCCUPATION: retired   PLOF: Independent   PATIENT GOALS: improve the urinary leakage   PERTINENT HISTORY:  COPD; MVP; Osteoporosis; Cervical laminectomy 2006; cesarean section 1993; total laparoscopic hysterectomy with salpingectomy;    BOWEL MOVEMENT: Pain with bowel movement: No Type of bowel movement:Type (Bristol Stool Scale) Type 3, Frequency daily, and Strain No Fully empty rectum: Yes: but will have an occasional feeling of not fully empty Leakage: No     URINATION: Pain with urination: No Fully empty bladder: No Stream: Strong when she does not rush to bathroom frequently Urgency: Yes: sometimes Frequency: urinate every 2 hours and sometimes will go longer Leakage: Urge to void, Coughing, Sneezing, Laughing, Lifting, and lifting something while walking Pads: Yes: pantyliner 3-4 and change just for freshness   INTERCOURSE: Pain with intercourse: Initial Penetration, During Penetration, and Pain Interrupts Intercourse Ability to have vaginal penetration:  Yes:   Marinoff Scale: 3/3   PREGNANCY: Vaginal deliveries 1 Tearing Yes: not sure how much C-section deliveries 1     PROLAPSE: None     OBJECTIVE:    DIAGNOSTIC FINDINGS:  none     COGNITION: Overall cognitive status: Within functional limits for tasks assessed                                POSTURE: No Significant postural limitations   PELVIC ALIGNMENT: in correct alignment     LOWER EXTREMITY  ROM: bilateral hip ROM is full     LOWER  EXTREMITY MMT:   MMT Right eval Left eval  Hip extension 4/5 4/5  Hip abduction 4/5 4/5  Hip adduction 4/5 4/5    PALPATION:   General  tenderness and tightness in the lower abdomen, good movement of the lower rib cage.                  External Perineal Exam intact                             Internal Pelvic Floor tenderness located on the left levator ani and obturator internist, restrictions on the urethra, tightness along the introitus   Patient confirms identification and approves PT to assess internal pelvic floor and treatment Yes   PELVIC MMT:   MMT eval 02/18/23 03/13/23  Vaginal 2/5 ant and post.  1/5 sides Started as 2/5 then at end had the smallest lift when she breathed out Weak 3/5  (Blank rows = not tested)         TONE: Increased at entrance   PROLAPSE: none   TODAY'S TREATMENT:  03/13/23 Manual: Internal pelvic floor techniques: No emotional/communication barriers or cognitive limitation. Patient is motivated to learn. Patient understands and agrees with treatment goals and plan. PT explains patient will be examined in standing, sitting, and lying down to see how their muscles and joints work. When they are ready, they will be asked to remove their underwear so PT can examine their perineum. The patient is also given the option of providing their own chaperone as one is not provided in our facility. The patient also has the right and is explained the right to defer or refuse any part of the evaluation or treatment including the internal exam. With the patient's consent, PT will use one gloved finger to gently assess the muscles of the pelvic floor, seeing how well it contracts and relaxes and if there is muscle symmetry. After, the patient will get dressed and PT and patient will discuss exam findings and plan of care. PT and patient discuss plan of care, schedule, attendance policy and HEP activities.  Going through the vaginal canal working on the urogenital  diaphragm, ischiocavernosus, bulbocavernosus, levator ani and urethra sphincter.  Neuromuscular re-education: Pelvic floor contraction training: Therapist using two fingers in the vaginal canal working on circular lift and giving tactile cues to the sides of the introitus to engage the muscles, therapist would try to genlty pull her fingers out of the vaginal canal to work on circular contraction and lift  03/06/23 Manual: Internal pelvic floor techniques: No emotional/communication barriers or cognitive limitation. Patient is motivated to learn. Patient understands and agrees with treatment goals and plan. PT explains patient will be examined in standing, sitting, and lying down to see how their muscles and joints work. When they are ready, they will be asked to remove their underwear so PT can examine their perineum. The patient is also given the option of providing their own chaperone as one is not provided in our facility. The patient also has the right and is explained the right to defer or refuse any part of the evaluation or treatment including the internal exam. With the patient's consent, PT will use one gloved finger to gently assess the muscles of the pelvic floor, seeing how well it contracts and relaxes and if there is muscle symmetry. After, the patient will get dressed  and PT and patient will discuss exam findings and plan of care. PT and patient discuss plan of care, schedule, attendance policy and HEP activities.  Therapist performed myofascial release to the ischiocavernosus, bulbocavernosus, along the labia and urogenital diaphragm and clitoris monitoring for pain and tightness  02/27/23 Manual: Myofascial release: Fascial release along the urogenital diaphragm one side at a time.  Neuromuscular re-education: Down training: Diaphragmatic breathing while sitting on physioball with tactile cues to bring the air into the lower rib cage and let the pelvic floor drop Sitting on  physioball with pelvic tilt, sway, circles and figure 8 Exercises: Stretches/mobility: Discussed with patient on how to apply the estrace cream into the vaginal canal and massage it into the tissue Quadruped with therapist putting pressure on the anococcygeal ligament to release the posterior pelvic floor and patient moves forward backward and diagonals     PATIENT EDUCATION:  02/18/23 Education details: Access Code: [email or web address] Person educated: Patient Education method: Programmer, multimedia, Demonstration, Actor cues, Verbal cues, and Handouts Education comprehension: verbalized understanding, returned demonstration, verbal cues required, tactile cues required, and needs further education   HOME EXERCISE PROGRAM: 02/1723 Access Code: [email or web address] URL: [email or web address] Date: 02/18/2023 Prepared by: Eulis Foster  Exercises - Supine Diaphragmatic Breathing  - 1 x daily - 7 x weekly - 1 sets - 10 reps - Seated Pelvic Floor Muscles Isometrics on Towel Roll  - 1 x daily - 7 x weekly - 1 sets - 10 reps - 5-8s holds - Quadruped Transversus Abdominis Bracing  - 1 x daily - 7 x weekly - 1 sets - 10 reps - Cat Cow  - 1 x daily - 7 x weekly - 1 sets - 10 reps - Sidelying Thoracic Rotation with Open Book  - 1 x daily - 7 x weekly - 1 sets - 2-3 reps - 30-45s holds - Supine Pelvic Floor Contraction  - 3 x daily - 7 x weekly - 10 reps - 5 sec hold - Quick Flick Pelvic Floor Contractions in Hooklying  - 1 x daily - 7 x weekly - 1 sets - 5 reps - Hooklying Isometric Hip Flexion  - 1 x daily - 7 x weekly - 1 sets - 10 reps   ASSESSMENT:   CLINICAL IMPRESSION: Patient is a 67 y.o. female who was seen today for physical therapy  treatment for chronic cystitis without hematuria.  The leakage is better. Patient has been more consistent with the estrace cream and able to relax more for urination. Patient is having less irritation in the urethral area.  Pelvic floor strength is a weak 3/5 with tactile  cues and she was able to feel the lower abdominals engage correctly. She did not push the therapist finger out when laughing.  Patient will benefit from skilled therapy to improve pelvic floor coordination to reduce leakage.    OBJECTIVE IMPAIRMENTS: decreased activity tolerance, decreased endurance, decreased strength, increased fascial restrictions, and increased muscle spasms.    ACTIVITY LIMITATIONS: continence and locomotion level   PARTICIPATION LIMITATIONS: shopping and community activity   PERSONAL FACTORS: 3+ comorbidities: COPD; MVP; Osteoporosis; Cervical laminectomy 2006; cesarean section 1993; total laparoscopic hysterectomy with salpingectomy;   are also affecting patient's functional outcome.    REHAB POTENTIAL: Excellent   CLINICAL DECISION MAKING: Stable/uncomplicated   EVALUATION COMPLEXITY: Low     GOALS: Goals reviewed with patient? Yes   SHORT TERM GOALS: Target date: 01/18/23   Patient independent with initial HEP for diaphragmatic breathing with pelvic floor drop.  Baseline: Goal status: Met 01/16/23   2.  Patient is independent with perineal massage to lengthen tissue.  Baseline:  Goal status: Met 02/27/23       LONG TERM GOALS: Target date: 03/20/23   Patient independent with advanced HEP for core and pelvic floor strength.  Baseline:  Goal status: ongoing 03/13/23   2.  Patient pelvic floor strength is >/= 3/5 with circular hug of therapist finger.  Baseline:  Goal status: ongoing 01/16/23   3.  Patient is able to cough, sneeze and laugh with 75% less urinary leakage.  Baseline:  Goal status: ongoing 01/16/23   4.  Patient is able to lift 10 #  and walk without leaking urine due to improved contractions.  Baseline:  Goal status: ongoing 03/13/23     PLAN:   PT FREQUENCY: 1x/week   PT DURATION: 12 weeks   PLANNED INTERVENTIONS: Therapeutic exercises, Therapeutic activity, Neuromuscular re-education, Patient/Family education, Dry Needling,  Electrical stimulation, Biofeedback, and Manual therapy   PLAN FOR NEXT SESSION:  manual work around the introitus on the right side;lifting  10 # ; and discuss dry needling to pelvic floor  Eulis Foster, PT 03/13/23 2:42 PM

## 2023-03-20 ENCOUNTER — Ambulatory Visit: Payer: 59 | Admitting: Physical Therapy

## 2023-03-20 ENCOUNTER — Encounter: Payer: Self-pay | Admitting: Physical Therapy

## 2023-03-20 DIAGNOSIS — R279 Unspecified lack of coordination: Secondary | ICD-10-CM

## 2023-03-20 DIAGNOSIS — M6281 Muscle weakness (generalized): Secondary | ICD-10-CM

## 2023-03-20 NOTE — Therapy (Signed)
OUTPATIENT PHYSICAL THERAPY TREATMENT NOTE   Patient Name: Emily Ward MRN: [id number] DOB:[birth year], 67 y.o., female Today's Date: 03/20/2023  PCP: Gershon Crane, MD  REFERRING PROVIDER: Crist Fat, MD   END OF SESSION:   PT End of Session - 03/20/23 1445     Visit Number 8    Date for PT Re-Evaluation 06/12/23    Authorization Type [employer]    PT Start Time 1445    PT Stop Time 1525    PT Time Calculation (min) 40 min    Activity Tolerance Patient tolerated treatment well    Behavior During Therapy [facility] for tasks assessed/performed             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     Fibromyalgia    sees Dr. 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 Norwood Levo, adenmatous polyp, repeat in 6 months   CYSTOSCOPY N/A 09/01/2018   Procedure: CYSTOSCOPY;  Surgeon: Jerene Bears, MD;  Location: [facility];  Service: Gynecology;  Laterality: N/A;   DILATATION & CURETTAGE/HYSTEROSCOPY WITH MYOSURE N/A 04/12/2017   Procedure: DILATATION & CURETTAGE/HYSTEROSCOPY;  Surgeon: Jerene Bears, MD;  Location: [facility];  Service: Gynecology;  Laterality: N/A;    epidural thoracic spine injection  06/07/2022   facet joint block  03/30/2021   per patient, performed by Dr. Alvester Morin   facet joint block  01/31/2022   per patient, performed by Dr. Alvester Morin   HYSTEROSCOPY     years ago with Dr Arelia Sneddon-? 2000   KNEE ARTHROSCOPY Left 10/24/2022   TOTAL LAPAROSCOPIC HYSTERECTOMY WITH SALPINGECTOMY Bilateral 09/01/2018   Procedure: TOTAL LAPAROSCOPIC HYSTERECTOMY WITH SALPINGECTOMY  BSO, REPAIR OF PERINEAL LACERATION;  Surgeon: Jerene Bears, MD;  Location: [facility];  Service: Gynecology;  Laterality: Bilateral;  possible BSO   URETHRAL DILATION     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   Palpitations 07/30/2017   Shortness of breath 07/30/2017   Hormone replacement therapy (HRT) 03/30/2017   Former smoker 03/30/2017   Other fatigue 06/30/2016   Insomnia 06/30/2016   DJD (degenerative joint disease), cervical 06/30/2016   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/2016   IBS (irritable bowel syndrome) 06/30/2016   Interstitial cystitis 06/30/2016   SHOULDER PAIN, RIGHT 09/05/2010  GERD 03/24/2009   SACROILIAC STRAIN 03/24/2009   Atypical chest pain 01/20/2008   HEMATURIA UNSPECIFIED 10/14/2007   FIBROMYALGIA 07/03/2007   HYPERLIPIDEMIA NEC/NOS 05/16/2007   HEADACHE 05/09/2007    REFERRING DIAG: N30.20 (ICD-10-CM) - Other chronic cystitis without hematuria    THERAPY DIAG:  Unspecified lack of coordination   Muscle weakness (generalized)   Rationale for Evaluation and Treatment: Rehabilitation   ONSET DATE: 04/2022   SUBJECTIVE:                                                                                                                                                                                             SUBJECTIVE STATEMENT: I had to episodes of urinary leakage when I got up from a chair.       PAIN:  Are you having pain? No   PRECAUTIONS: None   WEIGHT BEARING RESTRICTIONS: No   FALLS:  Has patient fallen in last 6 months? No   LIVING ENVIRONMENT: Lives with: lives with their spouse   OCCUPATION: retired   PLOF: Independent   PATIENT GOALS: improve the urinary leakage   PERTINENT HISTORY:  COPD; MVP; Osteoporosis; Cervical laminectomy 2006; cesarean section 1993; total laparoscopic hysterectomy with salpingectomy;    BOWEL MOVEMENT: Pain with bowel movement: No Type of bowel movement:Type (Bristol Stool Scale) Type 3, Frequency daily, and Strain No Fully empty rectum: Yes: but will have an occasional feeling of not fully empty Leakage: No     URINATION: Pain with urination: No Fully empty bladder: No Stream: Strong when she does not rush to bathroom frequently Urgency: Yes: sometimes Frequency: urinate every 2 hours and sometimes will go longer Leakage: Urge to void, Coughing, Sneezing, Pads: Yes: pantyliner 3-4 and change just for freshness   INTERCOURSE: not active at this time so will not be addressed Pain with intercourse: Initial Penetration, During Penetration, and Pain Interrupts Intercourse Ability to have vaginal penetration:  Yes:   Marinoff Scale: 3/3   PREGNANCY: Vaginal deliveries 1 Tearing Yes: not sure how much C-section deliveries 1     PROLAPSE: None     OBJECTIVE:    DIAGNOSTIC FINDINGS:  none     COGNITION: Overall cognitive status: Within functional limits for tasks assessed                                POSTURE: No Significant postural limitations   PELVIC ALIGNMENT: in correct alignment     LOWER EXTREMITY ROM: bilateral hip ROM is full     LOWER EXTREMITY MMT:   MMT Right eval  Left eval Right/left 03/20/23  Hip extension 4/5 4/5 4/5  Hip abduction 4/5 4/5 4/5   Hip adduction 4/5 4/5 4/5    PALPATION:   General  tenderness and tightness in the lower abdomen, good movement of the lower rib cage.                  External Perineal Exam intact                             Internal Pelvic Floor tenderness located on the left levator ani and obturator internist, restrictions on the urethra, tightness along the introitus   Patient confirms identification and approves PT to assess internal pelvic floor and treatment Yes   PELVIC MMT:   MMT eval 02/18/23 03/13/23 03/20/23  Vaginal 2/5 ant and post.  1/5 sides Started as 2/5 then at end had the smallest lift when she breathed out Weak 3/5 3/5 anterior and posterior and sides is 2/5 and needs tactile cues  (Blank rows = not tested)         TONE: Increased at entrance   PROLAPSE: none   TODAY'S TREATMENT:   03/20/23 Manual: Internal pelvic floor techniques: No emotional/communication barriers or cognitive limitation. Patient is motivated to learn. Patient understands and agrees with treatment goals and plan. PT explains patient will be examined in standing, sitting, and lying down to see how their muscles and joints work. When they are ready, they will be asked to remove their underwear so PT can examine their perineum. The patient is also given the option of providing their own chaperone as one is not provided in our facility. The patient also has the right and is explained the right to defer or refuse any part of the evaluation or treatment including the internal exam. With the patient's consent, PT will use one gloved finger to gently assess the muscles of the pelvic floor, seeing how well it contracts and relaxes and if there is muscle symmetry. After, the patient will get dressed and PT and patient will discuss exam findings and plan of care. PT and patient discuss plan of care, schedule, attendance policy and HEP activities.  Going through the vaginal canal working on the sides of the introitus, along  the levator ani, and perineal body Neuromuscular re-education: Pelvic floor contraction training: Using the therapist finger to work on the pelvic floor contraction and tapping technique to the sides of the introitus with 1 finger than with 2 Exercises: Strengthening: Pick up 10 # and walk 10 feet without urinary leakage Laying on side hip adduction 15 X with pelvic floor contraction Bridge with hip adduction 15 x  Supine with ball between knees and bring upward to hands  Supine with ball between feet and hips flexed at 90 moving ball between feet and roll side to side    03/13/23 Manual: Internal pelvic floor techniques: No emotional/communication barriers or cognitive limitation. Patient is motivated to learn. Patient understands and agrees with treatment goals and plan. PT explains patient will be examined in standing, sitting, and lying down to see how their muscles and joints work. When they are ready, they will be asked to remove their underwear so PT can examine their perineum. The patient is also given the option of providing their own chaperone as one is not provided in our facility. The patient also has the right and is explained the right to defer or refuse any part of the  evaluation or treatment including the internal exam. With the patient's consent, PT will use one gloved finger to gently assess the muscles of the pelvic floor, seeing how well it contracts and relaxes and if there is muscle symmetry. After, the patient will get dressed and PT and patient will discuss exam findings and plan of care. PT and patient discuss plan of care, schedule, attendance policy and HEP activities.  Going through the vaginal canal working on the urogenital diaphragm, ischiocavernosus, bulbocavernosus, levator ani and urethra sphincter.  Neuromuscular re-education: Pelvic floor contraction training: Therapist using two fingers in the vaginal canal working on circular lift and giving tactile cues to  the sides of the introitus to engage the muscles, therapist would try to genlty pull her fingers out of the vaginal canal to work on circular contraction and lift  03/06/23 Manual: Internal pelvic floor techniques: No emotional/communication barriers or cognitive limitation. Patient is motivated to learn. Patient understands and agrees with treatment goals and plan. PT explains patient will be examined in standing, sitting, and lying down to see how their muscles and joints work. When they are ready, they will be asked to remove their underwear so PT can examine their perineum. The patient is also given the option of providing their own chaperone as one is not provided in our facility. The patient also has the right and is explained the right to defer or refuse any part of the evaluation or treatment including the internal exam. With the patient's consent, PT will use one gloved finger to gently assess the muscles of the pelvic floor, seeing how well it contracts and relaxes and if there is muscle symmetry. After, the patient will get dressed and PT and patient will discuss exam findings and plan of care. PT and patient discuss plan of care, schedule, attendance policy and HEP activities.  Therapist performed myofascial release to the ischiocavernosus, bulbocavernosus, along the labia and urogenital diaphragm and clitoris monitoring for pain and tightness      PATIENT EDUCATION:  03/20/23 Education details: Access Code: [email or web address] Person educated: Patient Education method: Explanation, Demonstration, Tactile cues, Verbal cues, and Handouts Education comprehension: verbalized understanding, returned demonstration, verbal cues required, tactile cues required, and needs further education   HOME EXERCISE PROGRAM: 03/1723 Access Code: [email or web address] URL: [email or web address] Date: 02/18/2023 Prepared by: Eulis Foster  Exercises - Supine Diaphragmatic Breathing  - 1 x daily - 7 x weekly - 1 sets  - 10 reps - Seated Pelvic Floor Muscles Isometrics on Towel Roll  - 1 x daily - 7 x weekly - 1 sets - 10 reps - 5-8s holds - Quadruped Transversus Abdominis Bracing  - 1 x daily - 7 x weekly - 1 sets - 10 reps - Cat Cow  - 1 x daily - 7 x weekly - 1 sets - 10 reps - Sidelying Thoracic Rotation with Open Book  - 1 x daily - 7 x weekly - 1 sets - 2-3 reps - 30-45s holds - Supine Pelvic Floor Contraction  - 3 x daily - 7 x weekly - 10 reps - 5 sec hold - Quick Flick Pelvic Floor Contractions in Hooklying  - 1 x daily - 7 x weekly - 1 sets - 5 reps - Hooklying Isometric Hip Flexion  - 1 x daily - 7 x weekly - 1 sets - 10 reps   ASSESSMENT:   CLINICAL IMPRESSION: Patient is a 67 y.o. female who was seen today for physical therapy  treatment for chronic cystitis without  hematuria.  The leakage is better. Patient has been more consistent with the estrace cream and able to relax more for urination. Patient is having less irritation in the urethral area.  Pelvic floor strength is a weak 3/5 with tactile cues and she was able to feel the lower abdominals engage correctly. She did not push the therapist finger out when laughing.  She will leak with coughing, sneezing, and when she has the urge to void with a full bladder or when she just woke up. Pelvic floor strength is 3/5 anterior and 2/5 laterally with tactile cues to work on the lift. Patient is able to carry 10 # without leaking urine. Patient will benefit from skilled therapy to improve pelvic floor coordination to reduce leakage.    OBJECTIVE IMPAIRMENTS: decreased activity tolerance, decreased endurance, decreased strength, increased fascial restrictions, and increased muscle spasms.    ACTIVITY LIMITATIONS: continence and locomotion level   PARTICIPATION LIMITATIONS: shopping and community activity   PERSONAL FACTORS: 3+ comorbidities: COPD; MVP; Osteoporosis; Cervical laminectomy 2006; cesarean section 1993; total laparoscopic hysterectomy with  salpingectomy;   are also affecting patient's functional outcome.    REHAB POTENTIAL: Excellent   CLINICAL DECISION MAKING: Stable/uncomplicated   EVALUATION COMPLEXITY: Low     GOALS: Goals reviewed with patient? Yes   SHORT TERM GOALS: Target date: 01/18/23   Patient independent with initial HEP for diaphragmatic breathing with pelvic floor drop.  Baseline: Goal status: Met 01/16/23   2.  Patient is independent with perineal massage to lengthen tissue.  Baseline:  Goal status: Met 02/27/23       LONG TERM GOALS: Target date:06/12/23   Patient independent with advanced HEP for core and pelvic floor strength.  Baseline:  Goal status: ongoing 03/20/23   2.  Patient pelvic floor strength is >/= 3/5 with circular hug of therapist finger.  Baseline:  Goal status: ongoing 03/20/23   3.  Patient is able to cough, sneeze and laugh with 75% less urinary leakage.  Baseline:  Goal status: ongoing 03/20/23   4.  Patient is able to lift 10 #  and walk without leaking urine due to improved contractions.  Baseline:  Goal status: Met 03/20/23     PLAN:   PT FREQUENCY: 1x/week   PT DURATION: 12 weeks   PLANNED INTERVENTIONS: Therapeutic exercises, Therapeutic activity, Neuromuscular re-education, Patient/Family education, Dry Needling, Electrical stimulation, Biofeedback, and Manual therapy   PLAN FOR NEXT SESSION:  manual work around the introitus on the right side; and discuss dry needling to pelvic floor, exercise pelvic floor with hip adduction to stimulate the lateral walls of the introitus  Eulis Foster, PT 03/20/23 3:18 PM

## 2023-04-03 ENCOUNTER — Ambulatory Visit: Payer: 59 | Admitting: Physical Therapy

## 2023-04-03 ENCOUNTER — Encounter: Payer: Self-pay | Admitting: Physical Therapy

## 2023-04-03 DIAGNOSIS — R279 Unspecified lack of coordination: Secondary | ICD-10-CM

## 2023-04-03 DIAGNOSIS — M6281 Muscle weakness (generalized): Secondary | ICD-10-CM

## 2023-04-03 NOTE — Therapy (Signed)
OUTPATIENT PHYSICAL THERAPY TREATMENT NOTE   Patient Name: Emily Ward MRN: [id number] DOB:[birth year], 67 y.o., female Today's Date: 04/03/2023  PCP: Gershon Crane, MD  REFERRING PROVIDER: Crist Fat, MD   END OF SESSION:   PT End of Session - 04/03/23 1401     Visit Number 9    Date for PT Re-Evaluation 06/12/23    Authorization Type [employer]    PT Start Time 1400    PT Stop Time 1440    PT Time Calculation (min) 40 min    Activity Tolerance Patient tolerated treatment well    Behavior During Therapy [facility] for tasks assessed/performed             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     Fibromyalgia    sees Dr. 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 Norwood Levo, adenmatous polyp, repeat in 6 months   CYSTOSCOPY N/A 09/01/2018   Procedure: CYSTOSCOPY;  Surgeon: Jerene Bears, MD;  Location: [facility];  Service: Gynecology;  Laterality: N/A;   DILATATION & CURETTAGE/HYSTEROSCOPY WITH MYOSURE N/A 04/12/2017   Procedure: DILATATION & CURETTAGE/HYSTEROSCOPY;  Surgeon: Jerene Bears, MD;  Location: [facility];  Service: Gynecology;  Laterality: N/A;    epidural thoracic spine injection  06/07/2022   facet joint block  03/30/2021   per patient, performed by Dr. Alvester Morin   facet joint block  01/31/2022   per patient, performed by Dr. Alvester Morin   HYSTEROSCOPY     years ago with Dr Arelia Sneddon-? 2000   KNEE ARTHROSCOPY Left 10/24/2022   TOTAL LAPAROSCOPIC HYSTERECTOMY WITH SALPINGECTOMY Bilateral 09/01/2018   Procedure: TOTAL LAPAROSCOPIC HYSTERECTOMY WITH SALPINGECTOMY  BSO, REPAIR OF PERINEAL LACERATION;  Surgeon: Jerene Bears, MD;  Location: [facility];  Service: Gynecology;  Laterality: Bilateral;  possible BSO   URETHRAL DILATION     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   Palpitations 07/30/2017   Shortness of breath 07/30/2017   Hormone replacement therapy (HRT) 03/30/2017   Former smoker 03/30/2017   Other fatigue 06/30/2016   Insomnia 06/30/2016   DJD (degenerative joint disease), cervical 06/30/2016   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/2016   IBS (irritable bowel syndrome) 06/30/2016   Interstitial cystitis 06/30/2016   SHOULDER PAIN, RIGHT 09/05/2010  GERD 03/24/2009   SACROILIAC STRAIN 03/24/2009   Atypical chest pain 01/20/2008   HEMATURIA UNSPECIFIED 10/14/2007   FIBROMYALGIA 07/03/2007   HYPERLIPIDEMIA NEC/NOS 05/16/2007   HEADACHE 05/09/2007    REFERRING DIAG: N30.20 (ICD-10-CM) - Other chronic cystitis without hematuria    THERAPY DIAG:  Unspecified lack of coordination   Muscle weakness (generalized)   Rationale for Evaluation and Treatment: Rehabilitation   ONSET DATE: 04/2022   SUBJECTIVE:                                                                                                                                                                                             SUBJECTIVE STATEMENT: I had to episodes of urinary leakage one time when I coughed. One day I am feeling like I was getting a UTI. I used the Estrogen cream and calmed things down. I got up 1 time per night toward the morning to urinate. I am talking to a therapist now.     PAIN:  Are you having pain? No   PRECAUTIONS: None   WEIGHT BEARING RESTRICTIONS: No   FALLS:  Has patient fallen in last 6 months? No   LIVING ENVIRONMENT: Lives with: lives with their spouse   OCCUPATION: retired   PLOF: Independent   PATIENT GOALS: improve the urinary leakage   PERTINENT HISTORY:  COPD; MVP; Osteoporosis; Cervical laminectomy 2006; cesarean section 1993; total laparoscopic hysterectomy with salpingectomy;    BOWEL MOVEMENT: Pain with bowel movement: No Type of bowel movement:Type (Bristol Stool Scale) Type 3, Frequency daily, and Strain No Fully empty rectum: Yes: but will have an occasional feeling of not fully empty Leakage: No     URINATION: Pain with urination: No Fully empty bladder: No Stream: Strong when she does not rush to bathroom frequently Urgency: Yes: sometimes Frequency: urinate every 2 hours and sometimes will go longer Leakage: Urge to void, Coughing, Sneezing, Pads: Yes: pantyliner 3-4 and change just for freshness   INTERCOURSE: not active at this time so will not be addressed Pain with intercourse: Initial Penetration, During Penetration, and Pain Interrupts Intercourse Ability to have vaginal penetration:  Yes:   Marinoff Scale: 3/3   PREGNANCY: Vaginal deliveries 1 Tearing Yes: not sure how much C-section deliveries 1     PROLAPSE: None     OBJECTIVE:    DIAGNOSTIC FINDINGS:  none     COGNITION: Overall cognitive status: Within functional limits for tasks assessed                                POSTURE:  No Significant postural limitations   PELVIC ALIGNMENT: in correct alignment      LOWER EXTREMITY ROM: bilateral hip ROM is full     LOWER EXTREMITY MMT:   MMT Right eval Left eval Right/left 03/20/23  Hip extension 4/5 4/5 4/5  Hip abduction 4/5 4/5 4/5  Hip adduction 4/5 4/5 4/5    PALPATION:   General  tenderness and tightness in the lower abdomen, good movement of the lower rib cage.                  External Perineal Exam intact                             Internal Pelvic Floor tenderness located on the left levator ani and obturator internist, restrictions on the urethra, tightness along the introitus   Patient confirms identification and approves PT to assess internal pelvic floor and treatment Yes   PELVIC MMT:   MMT eval 02/18/23 03/13/23 03/20/23 04/03/23  Vaginal 2/5 ant and post.  1/5 sides Started as 2/5 then at end had the smallest lift when she breathed out Weak 3/5 3/5 anterior and posterior and sides is 2/5 and needs tactile cues 3/5 with circular contraction  (Blank rows = not tested)         TONE: Increased at entrance   PROLAPSE: none   TODAY'S TREATMENT:   04/03/23 Manual: Internal pelvic floor techniques: No emotional/communication barriers or cognitive limitation. Patient is motivated to learn. Patient understands and agrees with treatment goals and plan. PT explains patient will be examined in standing, sitting, and lying down to see how their muscles and joints work. When they are ready, they will be asked to remove their underwear so PT can examine their perineum. The patient is also given the option of providing their own chaperone as one is not provided in our facility. The patient also has the right and is explained the right to defer or refuse any part of the evaluation or treatment including the internal exam. With the patient's consent, PT will use one gloved finger to gently assess the muscles of the pelvic floor, seeing how well it contracts and relaxes and if there is muscle symmetry. After, the patient will get dressed and PT  and patient will discuss exam findings and plan of care. PT and patient discuss plan of care, schedule, attendance policy and HEP activities.  Going through the vaginal canal working on the introitus and perineal body, ischiocavernosus, and urogenital diaphragm Exercises: Strengthening: Laying on side hip adduction 15 X with pelvic floor contraction and press top hand into ball Curl up with ball between back to limit motion 5 times Diagonal curl up with ball between back and mat to limit motion 5 times each side Bridge with ball between knees and squeeze ball between hands and move shoulder up and down  03/20/23 Manual: Internal pelvic floor techniques: No emotional/communication barriers or cognitive limitation. Patient is motivated to learn. Patient understands and agrees with treatment goals and plan. PT explains patient will be examined in standing, sitting, and lying down to see how their muscles and joints work. When they are ready, they will be asked to remove their underwear so PT can examine their perineum. The patient is also given the option of providing their own chaperone as one is not provided in our facility. The patient also has the right and is explained the right to defer or refuse  any part of the evaluation or treatment including the internal exam. With the patient's consent, PT will use one gloved finger to gently assess the muscles of the pelvic floor, seeing how well it contracts and relaxes and if there is muscle symmetry. After, the patient will get dressed and PT and patient will discuss exam findings and plan of care. PT and patient discuss plan of care, schedule, attendance policy and HEP activities.  Going through the vaginal canal working on the sides of the introitus, along the levator ani, and perineal body Neuromuscular re-education: Pelvic floor contraction training: Using the therapist finger to work on the pelvic floor contraction and tapping technique to the sides  of the introitus with 1 finger than with 2 Exercises: Strengthening: Pick up 10 # and walk 10 feet without urinary leakage Laying on side hip adduction 15 X with pelvic floor contraction Bridge with hip adduction 15 x  Supine with ball between knees and bring upward to hands  Supine with ball between feet and hips flexed at 90 moving ball between feet and roll side to side    03/13/23 Manual: Internal pelvic floor techniques: No emotional/communication barriers or cognitive limitation. Patient is motivated to learn. Patient understands and agrees with treatment goals and plan. PT explains patient will be examined in standing, sitting, and lying down to see how their muscles and joints work. When they are ready, they will be asked to remove their underwear so PT can examine their perineum. The patient is also given the option of providing their own chaperone as one is not provided in our facility. The patient also has the right and is explained the right to defer or refuse any part of the evaluation or treatment including the internal exam. With the patient's consent, PT will use one gloved finger to gently assess the muscles of the pelvic floor, seeing how well it contracts and relaxes and if there is muscle symmetry. After, the patient will get dressed and PT and patient will discuss exam findings and plan of care. PT and patient discuss plan of care, schedule, attendance policy and HEP activities.  Going through the vaginal canal working on the urogenital diaphragm, ischiocavernosus, bulbocavernosus, levator ani and urethra sphincter.  Neuromuscular re-education: Pelvic floor contraction training: Therapist using two fingers in the vaginal canal working on circular lift and giving tactile cues to the sides of the introitus to engage the muscles, therapist would try to genlty pull her fingers out of the vaginal canal to work on circular contraction and lift     PATIENT EDUCATION:   03/20/23 Education details: Access Code: [email or web address] Person educated: Patient Education method: Explanation, Demonstration, Tactile cues, Verbal cues, and Handouts Education comprehension: verbalized understanding, returned demonstration, verbal cues required, tactile cues required, and needs further education   HOME EXERCISE PROGRAM: 03/1723 Access Code: [email or web address] URL: [email or web address] Date: 02/18/2023 Prepared by: Eulis Foster  Exercises - Supine Diaphragmatic Breathing  - 1 x daily - 7 x weekly - 1 sets - 10 reps - Seated Pelvic Floor Muscles Isometrics on Towel Roll  - 1 x daily - 7 x weekly - 1 sets - 10 reps - 5-8s holds - Quadruped Transversus Abdominis Bracing  - 1 x daily - 7 x weekly - 1 sets - 10 reps - Cat Cow  - 1 x daily - 7 x weekly - 1 sets - 10 reps - Sidelying Thoracic Rotation with Open Book  - 1 x daily - 7 x weekly - 1 sets -  2-3 reps - 30-45s holds - Supine Pelvic Floor Contraction  - 3 x daily - 7 x weekly - 10 reps - 5 sec hold - Quick Flick Pelvic Floor Contractions in Hooklying  - 1 x daily - 7 x weekly - 1 sets - 5 reps - Hooklying Isometric Hip Flexion  - 1 x daily - 7 x weekly - 1 sets - 10 reps   ASSESSMENT:   CLINICAL IMPRESSION: Patient is a 67 y.o. female who was seen today for physical therapy  treatment for chronic cystitis without hematuria.  The leakage is better. Patient has been more consistent with the estrace cream and it has helped with the sensation of UTI irritation. Patient is having less irritation in the urethral area.  Pelvic floor strength is  3/5 with circular hug of therapist finger.  She will leaked 1 time  with coughing, Patient will benefit from skilled therapy to improve pelvic floor coordination to reduce leakage.    OBJECTIVE IMPAIRMENTS: decreased activity tolerance, decreased endurance, decreased strength, increased fascial restrictions, and increased muscle spasms.    ACTIVITY LIMITATIONS: continence and  locomotion level   PARTICIPATION LIMITATIONS: shopping and community activity   PERSONAL FACTORS: 3+ comorbidities: COPD; MVP; Osteoporosis; Cervical laminectomy 2006; cesarean section 1993; total laparoscopic hysterectomy with salpingectomy;   are also affecting patient's functional outcome.    REHAB POTENTIAL: Excellent   CLINICAL DECISION MAKING: Stable/uncomplicated   EVALUATION COMPLEXITY: Low     GOALS: Goals reviewed with patient? Yes   SHORT TERM GOALS: Target date: 01/18/23   Patient independent with initial HEP for diaphragmatic breathing with pelvic floor drop.  Baseline: Goal status: Met 01/16/23   2.  Patient is independent with perineal massage to lengthen tissue.  Baseline:  Goal status: Met 02/27/23       LONG TERM GOALS: Target date:06/12/23   Patient independent with advanced HEP for core and pelvic floor strength.  Baseline:  Goal status: ongoing 03/20/23   2.  Patient pelvic floor strength is >/= 3/5 with circular hug of therapist finger.  Baseline:  Goal status: ongoing 03/20/23   3.  Patient is able to cough, sneeze and laugh with 75% less urinary leakage.  Baseline:  Goal status: ongoing 03/20/23   4.  Patient is able to lift 10 #  and walk without leaking urine due to improved contractions.  Baseline:  Goal status: Met 03/20/23     PLAN:   PT FREQUENCY: 1x/week   PT DURATION: 12 weeks   PLANNED INTERVENTIONS: Therapeutic exercises, Therapeutic activity, Neuromuscular re-education, Patient/Family education, Dry Needling, Electrical stimulation, Biofeedback, and Manual therapy   PLAN FOR NEXT SESSION:  manual work around the introitus on the right side; exercise pelvic floor with hip adduction to stimulate the lateral walls of the introitus  Eulis Foster, PT 04/03/23 2:42 PM

## 2023-04-17 ENCOUNTER — Encounter: Payer: Self-pay | Admitting: Physical Therapy

## 2023-04-17 ENCOUNTER — Ambulatory Visit: Payer: 59 | Attending: Urology | Admitting: Physical Therapy

## 2023-04-17 DIAGNOSIS — R279 Unspecified lack of coordination: Secondary | ICD-10-CM

## 2023-04-17 DIAGNOSIS — M6281 Muscle weakness (generalized): Secondary | ICD-10-CM

## 2023-04-17 NOTE — Therapy (Signed)
OUTPATIENT PHYSICAL THERAPY TREATMENT NOTE   Patient Name: Emily Ward MRN: [id number] DOB:23-Feb-1956, 67 y.o., female Today's Date: 04/17/2023  PCP: Gershon Crane, MD  REFERRING PROVIDER: Crist Fat, MD   END OF SESSION:   PT End of Session - 04/17/23 1401     Visit Number 10    Date for PT Re-Evaluation 06/12/23    Authorization Type [employer]    PT Start Time 1400    PT Stop Time 1440    PT Time Calculation (min) 40 min    Activity Tolerance Patient tolerated treatment well    Behavior During Therapy [facility] for tasks assessed/performed             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     Fibromyalgia    sees Dr. 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 Norwood Levo, adenmatous polyp, repeat in 6 months   CYSTOSCOPY N/A 09/01/2018   Procedure: CYSTOSCOPY;  Surgeon: Jerene Bears, MD;  Location: [facility];  Service: Gynecology;  Laterality: N/A;   DILATATION & CURETTAGE/HYSTEROSCOPY WITH MYOSURE N/A 04/12/2017   Procedure: DILATATION & CURETTAGE/HYSTEROSCOPY;  Surgeon: Jerene Bears, MD;  Location: [facility];  Service: Gynecology;  Laterality: N/A;    epidural thoracic spine injection  06/07/2022   facet joint block  03/30/2021   per patient, performed by Dr. Alvester Morin   facet joint block  01/31/2022   per patient, performed by Dr. Alvester Morin   HYSTEROSCOPY     years ago with Dr Arelia Sneddon-? 2000   KNEE ARTHROSCOPY Left 10/24/2022   TOTAL LAPAROSCOPIC HYSTERECTOMY WITH SALPINGECTOMY Bilateral 09/01/2018   Procedure: TOTAL LAPAROSCOPIC HYSTERECTOMY WITH SALPINGECTOMY  BSO, REPAIR OF PERINEAL LACERATION;  Surgeon: Jerene Bears, MD;  Location: [facility];  Service: Gynecology;  Laterality: Bilateral;  possible BSO   URETHRAL DILATION     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   Palpitations 07/30/2017   Shortness of breath 07/30/2017   Hormone replacement therapy (HRT) 03/30/2017   Former smoker 03/30/2017   Other fatigue 06/30/2016   Insomnia 06/30/2016   DJD (degenerative joint disease), cervical 06/30/2016   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/2016   IBS (irritable bowel syndrome) 06/30/2016   Interstitial cystitis 06/30/2016   SHOULDER PAIN, RIGHT 09/05/2010  GERD 03/24/2009   SACROILIAC STRAIN 03/24/2009   Atypical chest pain 01/20/2008   HEMATURIA UNSPECIFIED 10/14/2007   FIBROMYALGIA 07/03/2007   HYPERLIPIDEMIA NEC/NOS 05/16/2007   HEADACHE 05/09/2007    REFERRING DIAG: N30.20 (ICD-10-CM) - Other chronic cystitis without hematuria    THERAPY DIAG:  Unspecified lack of coordination   Muscle weakness (generalized)   Rationale for Evaluation and Treatment: Rehabilitation   ONSET DATE: 04/2022   SUBJECTIVE:                                                                                                                                                                                             SUBJECTIVE STATEMENT: I had 1 urinary leakage since last visit and the other times I was able to stop it. I want to know if I am using the muscles the way I am suppose to use them. I have been going to therapy to talk to someone.     PAIN:  Are you having pain? No   PRECAUTIONS: None   WEIGHT BEARING RESTRICTIONS: No   FALLS:  Has patient fallen in last 6 months? No   LIVING ENVIRONMENT: Lives with: lives with their spouse   OCCUPATION: retired   PLOF: Independent   PATIENT GOALS: improve the urinary leakage   PERTINENT HISTORY:  COPD; MVP; Osteoporosis; Cervical laminectomy 2006; cesarean section 1993; total laparoscopic hysterectomy with salpingectomy;    BOWEL MOVEMENT: Pain with bowel movement: No Type of bowel movement:Type (Bristol Stool Scale) Type 3, Frequency daily, and Strain No Fully empty rectum: Yes: but will have an occasional feeling of not fully empty Leakage: No     URINATION: Pain with urination: No Fully empty bladder: No Stream: Strong when she does not rush to bathroom frequently Urgency: Yes: sometimes Frequency: urinate every 2 hours and sometimes will go longer Leakage: Urge to void, Coughing, Sneezing, Pads: Yes: pantyliner 3-4 and change just for freshness   INTERCOURSE: not active at this time so will not be addressed Pain with intercourse: Initial Penetration, During Penetration, and Pain Interrupts Intercourse Ability to have vaginal penetration:  Yes:   Marinoff Scale: 3/3   PREGNANCY: Vaginal deliveries 1 Tearing Yes: not sure how much C-section deliveries 1     PROLAPSE: None     OBJECTIVE:    DIAGNOSTIC FINDINGS:  none     COGNITION: Overall cognitive status: Within functional limits for tasks assessed                                POSTURE: No Significant postural limitations  PELVIC ALIGNMENT: in correct alignment     LOWER EXTREMITY ROM: bilateral hip ROM is  full     LOWER EXTREMITY MMT:   MMT Right eval Left eval Right/left 03/20/23  Hip extension 4/5 4/5 4/5  Hip abduction 4/5 4/5 4/5  Hip adduction 4/5 4/5 4/5    PALPATION:   General  tenderness and tightness in the lower abdomen, good movement of the lower rib cage.                  External Perineal Exam intact                             Internal Pelvic Floor tenderness located on the left levator ani and obturator internist, restrictions on the urethra, tightness along the introitus   Patient confirms identification and approves PT to assess internal pelvic floor and treatment Yes   PELVIC MMT:   MMT eval 02/18/23 03/13/23 03/20/23 04/03/23 04/17/23  Vaginal 2/5 ant and post.  1/5 sides Started as 2/5 then at end had the smallest lift when she breathed out Weak 3/5 3/5 anterior and posterior and sides is 2/5 and needs tactile cues 3/5 with circular contraction 3/5 with circular contraction  (Blank rows = not tested)         TONE: Increased at entrance   PROLAPSE: none   TODAY'S TREATMENT:   04/17/23 Manual: Internal pelvic floor techniques: No emotional/communication barriers or cognitive limitation. Patient is motivated to learn. Patient understands and agrees with treatment goals and plan. PT explains patient will be examined in standing, sitting, and lying down to see how their muscles and joints work. When they are ready, they will be asked to remove their underwear so PT can examine their perineum. The patient is also given the option of providing their own chaperone as one is not provided in our facility. The patient also has the right and is explained the right to defer or refuse any part of the evaluation or treatment including the internal exam. With the patient's consent, PT will use one gloved finger to gently assess the muscles of the pelvic floor, seeing how well it contracts and relaxes and if there is muscle symmetry. After, the patient will get dressed and PT and  patient will discuss exam findings and plan of care. PT and patient discuss plan of care, schedule, attendance policy and HEP activities. Going through the vaginal canal working on the perineal body and levator ani Dry needling: Neuromuscular re-education: Pelvic floor contraction training: Therapist index finger working with patient on pelvic floor contraction with tactile cues Educated patient on pelvic floor home biofeedback units that she can look into Exercises: Strengthening: Curl up with ball between back to limit motion 5 times Diagonal curl up with ball between back and mat to limit motion 5 times each side Stand press ball into wall with the pelvic floor exercises  04/03/23 Manual: Internal pelvic floor techniques: No emotional/communication barriers or cognitive limitation. Patient is motivated to learn. Patient understands and agrees with treatment goals and plan. PT explains patient will be examined in standing, sitting, and lying down to see how their muscles and joints work. When they are ready, they will be asked to remove their underwear so PT can examine their perineum. The patient is also given the option of providing their own chaperone as one is not provided in our facility. The patient also has the right and is explained  the right to defer or refuse any part of the evaluation or treatment including the internal exam. With the patient's consent, PT will use one gloved finger to gently assess the muscles of the pelvic floor, seeing how well it contracts and relaxes and if there is muscle symmetry. After, the patient will get dressed and PT and patient will discuss exam findings and plan of care. PT and patient discuss plan of care, schedule, attendance policy and HEP activities.  Going through the vaginal canal working on the introitus and perineal body, ischiocavernosus, and urogenital diaphragm Exercises: Strengthening: Laying on side hip adduction 15 X with pelvic floor  contraction and press top hand into ball Curl up with ball between back to limit motion 5 times Diagonal curl up with ball between back and mat to limit motion 5 times each side Bridge with ball between knees and squeeze ball between hands and move shoulder up and down  03/20/23 Manual: Internal pelvic floor techniques: No emotional/communication barriers or cognitive limitation. Patient is motivated to learn. Patient understands and agrees with treatment goals and plan. PT explains patient will be examined in standing, sitting, and lying down to see how their muscles and joints work. When they are ready, they will be asked to remove their underwear so PT can examine their perineum. The patient is also given the option of providing their own chaperone as one is not provided in our facility. The patient also has the right and is explained the right to defer or refuse any part of the evaluation or treatment including the internal exam. With the patient's consent, PT will use one gloved finger to gently assess the muscles of the pelvic floor, seeing how well it contracts and relaxes and if there is muscle symmetry. After, the patient will get dressed and PT and patient will discuss exam findings and plan of care. PT and patient discuss plan of care, schedule, attendance policy and HEP activities.  Going through the vaginal canal working on the sides of the introitus, along the levator ani, and perineal body Neuromuscular re-education: Pelvic floor contraction training: Using the therapist finger to work on the pelvic floor contraction and tapping technique to the sides of the introitus with 1 finger than with 2 Exercises: Strengthening: Pick up 10 # and walk 10 feet without urinary leakage Laying on side hip adduction 15 X with pelvic floor contraction Bridge with hip adduction 15 x  Supine with ball between knees and bring upward to hands  Supine with ball between feet and hips flexed at 90 moving  ball between feet and roll side to side      PATIENT EDUCATION:  03/20/23 Education details: Access Code: [email or web address] Person educated: Patient Education method: Programmer, multimedia, Demonstration, Actor cues, Verbal cues, and Handouts Education comprehension: verbalized understanding, returned demonstration, verbal cues required, tactile cues required, and needs further education   HOME EXERCISE PROGRAM: 03/1723 Access Code: [email or web address] URL: [email or web address] Date: 02/18/2023 Prepared by: Eulis Foster  Exercises - Supine Diaphragmatic Breathing  - 1 x daily - 7 x weekly - 1 sets - 10 reps - Seated Pelvic Floor Muscles Isometrics on Towel Roll  - 1 x daily - 7 x weekly - 1 sets - 10 reps - 5-8s holds - Quadruped Transversus Abdominis Bracing  - 1 x daily - 7 x weekly - 1 sets - 10 reps - Cat Cow  - 1 x daily - 7 x weekly - 1 sets - 10 reps - Sidelying Thoracic Rotation with Open  Book  - 1 x daily - 7 x weekly - 1 sets - 2-3 reps - 30-45s holds - Supine Pelvic Floor Contraction  - 3 x daily - 7 x weekly - 10 reps - 5 sec hold - Quick Flick Pelvic Floor Contractions in Hooklying  - 1 x daily - 7 x weekly - 1 sets - 5 reps - Hooklying Isometric Hip Flexion  - 1 x daily - 7 x weekly - 1 sets - 10 reps   ASSESSMENT:   CLINICAL IMPRESSION: Patient is a 67 y.o. female who was seen today for physical therapy  treatment for chronic cystitis without hematuria.  Patient has not had issues with constipation for several weeks.  Patient may benefit from a home pelvic floor EMG unit to assist with pelvic floor coordination. She does well with tactile cues to the muscles to get a contraction and for her to feel the contraction. Patient will benefit from skilled therapy to improve pelvic floor coordination to reduce leakage.    OBJECTIVE IMPAIRMENTS: decreased activity tolerance, decreased endurance, decreased strength, increased fascial restrictions, and increased muscle spasms.    ACTIVITY  LIMITATIONS: continence and locomotion level   PARTICIPATION LIMITATIONS: shopping and community activity   PERSONAL FACTORS: 3+ comorbidities: COPD; MVP; Osteoporosis; Cervical laminectomy 2006; cesarean section 1993; total laparoscopic hysterectomy with salpingectomy;   are also affecting patient's functional outcome.    REHAB POTENTIAL: Excellent   CLINICAL DECISION MAKING: Stable/uncomplicated   EVALUATION COMPLEXITY: Low     GOALS: Goals reviewed with patient? Yes   SHORT TERM GOALS: Target date: 01/18/23   Patient independent with initial HEP for diaphragmatic breathing with pelvic floor drop.  Baseline: Goal status: Met 01/16/23   2.  Patient is independent with perineal massage to lengthen tissue.  Baseline:  Goal status: Met 02/27/23       LONG TERM GOALS: Target date:06/12/23   Patient independent with advanced HEP for core and pelvic floor strength.  Baseline:  Goal status: ongoing 03/20/23   2.  Patient pelvic floor strength is >/= 3/5 with circular hug of therapist finger.  Baseline: takes several tries Goal status: ongoing 03/20/23   3.  Patient is able to cough, sneeze and laugh with 75% less urinary leakage.  Baseline:  Goal status: ongoing 03/20/23   4.  Patient is able to lift 10 #  and walk without leaking urine due to improved contractions.  Baseline:  Goal status: Met 03/20/23     PLAN:   PT FREQUENCY: 1x/week   PT DURATION: 12 weeks   PLANNED INTERVENTIONS: Therapeutic exercises, Therapeutic activity, Neuromuscular re-education, Patient/Family education, Dry Needling, Electrical stimulation, Biofeedback, and Manual therapy   PLAN FOR NEXT SESSION:  manual work around the introitus on the right side; exercise pelvic floor with hip adduction to stimulate the lateral walls of the introitus, discuss the home unit  Eulis Foster, PT 04/17/23 2:44 PM

## 2023-04-24 ENCOUNTER — Encounter: Payer: Self-pay | Admitting: Physical Therapy

## 2023-05-01 ENCOUNTER — Other Ambulatory Visit: Payer: Self-pay | Admitting: Family Medicine

## 2023-05-08 ENCOUNTER — Ambulatory Visit: Payer: 59 | Admitting: Family Medicine

## 2023-05-08 ENCOUNTER — Encounter: Payer: Self-pay | Admitting: Family Medicine

## 2023-05-08 VITALS — BP 100/70 | HR 76 | Temp 98.1°F

## 2023-05-08 DIAGNOSIS — J3089 Other allergic rhinitis: Secondary | ICD-10-CM

## 2023-05-08 DIAGNOSIS — J029 Acute pharyngitis, unspecified: Secondary | ICD-10-CM | POA: Diagnosis not present

## 2023-05-08 DIAGNOSIS — B349 Viral infection, unspecified: Secondary | ICD-10-CM | POA: Diagnosis not present

## 2023-05-08 LAB — POCT RAPID STREP A (OFFICE): Rapid Strep A Screen: NEGATIVE

## 2023-05-08 NOTE — Progress Notes (Signed)
   Subjective:    Patient ID: Emily Ward, female    DOB: 1956-07-07, 67 y.o.   MRN: [id number]  HPI Here for several days of mild head congestion, PND, ST, and vertigo. This began a week ago with a few days of fever to 101 degrees, headache, body aches, and ST. No cough. She tested negative for Covid at home. These symptome have resolved, and they have been replaced with the above symptoms. She has taken Zyrtec for 2 days and this has helped.    Review of Systems  Constitutional:  Positive for fever.  HENT:  Positive for congestion, postnasal drip and sore throat. Negative for ear pain.   Eyes: Negative.   Respiratory: Negative.    Neurological:  Positive for dizziness.       Objective:   Physical Exam Constitutional:      Appearance: Normal appearance. She is not ill-appearing.  HENT:     Right Ear: Tympanic membrane, ear canal and external ear normal.     Left Ear: Tympanic membrane, ear canal and external ear normal.     Nose: Nose normal.     Mouth/Throat:     Pharynx: Oropharynx is clear.  Eyes:     Conjunctiva/sclera: Conjunctivae normal.  Pulmonary:     Effort: Pulmonary effort is normal.     Breath sounds: Normal breath sounds.  Lymphadenopathy:     Cervical: No cervical adenopathy.  Neurological:     General: No focal deficit present.     Mental Status: She is alert and oriented to person, place, and time.           Assessment & Plan:  She had a viral illness a week ago that has now resolved. Her current symptoms stem from allergies, so she will continue with the Zyrtec. We will add Sudafed and Flonase sprays as needed.  Gershon Crane, MD

## 2023-05-08 NOTE — Addendum Note (Signed)
Addended by: Carola Rhine on: 05/08/2023 05:15 PM   Modules accepted: Orders

## 2023-05-15 ENCOUNTER — Ambulatory Visit: Payer: 59 | Admitting: Family Medicine

## 2023-05-15 ENCOUNTER — Encounter: Payer: Self-pay | Admitting: Family Medicine

## 2023-05-15 VITALS — BP 98/68 | HR 83 | Temp 98.0°F | Ht 65.0 in | Wt 103.4 lb

## 2023-05-15 DIAGNOSIS — H019 Unspecified inflammation of eyelid: Secondary | ICD-10-CM | POA: Diagnosis not present

## 2023-05-15 NOTE — Progress Notes (Unsigned)
   Established Patient Office Visit   Subjective  Patient ID: Emily Ward, female    DOB: 06/17/1956  Age: 67 y.o. MRN: [id number]  Chief Complaint  Patient presents with   Eye Problem    Bilateral eye Swelling itchy, burning    Patient is a 67 year old female followed by Dr. Clent Ridges and seen for acute concern.  Patient endorses several episodes of itching, edema, and redness of bilateral eyelids over the last month.  Patient initially thought symptoms were due to a facial cleansing product she used.  Patient notes recent episode started 2 days ago.  Patient woke up with eyelids swollen and red.  States the skin feels raw.  Denies eye pain, eye discharge, eye irritation.  Thought was related to allergy symptoms but has seen no relief with Flonase and Zyrtec.  Eye Problem     {History (Optional):23778}  ROS Negative unless stated above    Objective:     BP 98/68 (BP Location: Right Arm, Patient Position: Sitting, Cuff Size: Normal)   Pulse 83   Temp 98 F (36.7 C) (Oral)   Ht 5\' 5"  (1.651 m)   Wt 103 lb 6.4 oz (46.9 kg)   LMP 09/03/2000 (Approximate)   SpO2 96%   BMI 17.21 kg/m  {Vitals History (Optional):23777}  Physical Exam   No results found for any visits on 05/15/23.    Assessment & Plan:  There are no diagnoses linked to this encounter.  No follow-ups on file.   Deeann Saint, MD

## 2023-05-16 ENCOUNTER — Encounter: Payer: Self-pay | Admitting: Family Medicine

## 2023-05-22 ENCOUNTER — Ambulatory Visit: Payer: 59 | Attending: Urology | Admitting: Physical Therapy

## 2023-05-22 ENCOUNTER — Encounter: Payer: Self-pay | Admitting: Physical Therapy

## 2023-05-22 DIAGNOSIS — R279 Unspecified lack of coordination: Secondary | ICD-10-CM | POA: Diagnosis present

## 2023-05-22 DIAGNOSIS — M6281 Muscle weakness (generalized): Secondary | ICD-10-CM | POA: Diagnosis present

## 2023-05-22 DIAGNOSIS — R293 Abnormal posture: Secondary | ICD-10-CM | POA: Diagnosis present

## 2023-05-22 NOTE — Therapy (Signed)
OUTPATIENT PHYSICAL THERAPY TREATMENT NOTE   Patient Name: Emily Ward MRN: [id number] DOB:07-Sep-1955, 67 y.o., female Today's Date: 05/22/2023  PCP: Gershon Crane, MD  REFERRING PROVIDER: Crist Fat, MD   END OF SESSION:   PT End of Session - 05/22/23 1401     Visit Number 11    Date for PT Re-Evaluation 06/12/23    Authorization Type [employer]    PT Start Time 1400    PT Stop Time 1440    PT Time Calculation (min) 40 min    Activity Tolerance Patient tolerated treatment well    Behavior During Therapy [facility] for tasks assessed/performed             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     Fibromyalgia    sees Dr. 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 Norwood Levo, adenmatous polyp, repeat in 6 months   CYSTOSCOPY N/A 09/01/2018   Procedure: CYSTOSCOPY;  Surgeon: Jerene Bears, MD;  Location: [facility];  Service: Gynecology;  Laterality: N/A;   DILATATION & CURETTAGE/HYSTEROSCOPY WITH MYOSURE N/A 04/12/2017   Procedure: DILATATION & CURETTAGE/HYSTEROSCOPY;  Surgeon: Jerene Bears, MD;  Location: [facility];  Service: Gynecology;  Laterality: N/A;    epidural thoracic spine injection  06/07/2022   facet joint block  03/30/2021   per patient, performed by Dr. Alvester Morin   facet joint block  01/31/2022   per patient, performed by Dr. Alvester Morin   HYSTEROSCOPY     years ago with Dr Arelia Sneddon-? 2000   KNEE ARTHROSCOPY Left 10/24/2022   TOTAL LAPAROSCOPIC HYSTERECTOMY WITH SALPINGECTOMY Bilateral 09/01/2018   Procedure: TOTAL LAPAROSCOPIC HYSTERECTOMY WITH SALPINGECTOMY  BSO, REPAIR OF PERINEAL LACERATION;  Surgeon: Jerene Bears, MD;  Location: [facility];  Service: Gynecology;  Laterality: Bilateral;  possible BSO   URETHRAL DILATION     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   Palpitations 07/30/2017   Shortness of breath 07/30/2017   Hormone replacement therapy (HRT) 03/30/2017   Former smoker 03/30/2017   Other fatigue 06/30/2016   Insomnia 06/30/2016   DJD (degenerative joint disease), cervical 06/30/2016   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/2016   IBS (irritable bowel syndrome) 06/30/2016   Interstitial cystitis 06/30/2016   SHOULDER PAIN, RIGHT 09/05/2010  GERD 03/24/2009   SACROILIAC STRAIN 03/24/2009   Atypical chest pain 01/20/2008   HEMATURIA UNSPECIFIED 10/14/2007   FIBROMYALGIA 07/03/2007   HYPERLIPIDEMIA NEC/NOS 05/16/2007   HEADACHE 05/09/2007    REFERRING DIAG: N30.20 (ICD-10-CM) - Other chronic cystitis without hematuria    THERAPY DIAG:  Unspecified lack of coordination   Muscle weakness (generalized)   Rationale for Evaluation and Treatment: Rehabilitation   ONSET DATE: 04/2022   SUBJECTIVE:                                                                                                                                                                                             SUBJECTIVE STATEMENT: I have not been here due to being sick. I have an issue with once in awhile I cough or exercise. I am wearing 2 pads per day. Some days I change pad for freshness or leakage.     PAIN:  Are you having pain? No   PRECAUTIONS: None   WEIGHT BEARING RESTRICTIONS: No   FALLS:  Has patient fallen in last 6 months? No   LIVING ENVIRONMENT: Lives with: lives with their spouse   OCCUPATION: retired   PLOF: Independent   PATIENT GOALS: improve the urinary leakage   PERTINENT HISTORY:  COPD; MVP; Osteoporosis; Cervical laminectomy 2006; cesarean section 1993; total laparoscopic hysterectomy with salpingectomy;    BOWEL MOVEMENT: Pain with bowel movement: No Type of bowel movement:Type (Bristol Stool Scale) Type 3, Frequency daily, and Strain No Fully empty rectum: Yes: but will have an occasional feeling of not fully empty Leakage: No     URINATION: Pain with urination: No Fully empty bladder: No Stream: Strong when she does not rush to bathroom frequently Urgency: Yes: sometimes Frequency: urinate every 2 hours and sometimes will go longer Leakage: Urge to void, Coughing, Sneezing, Pads: Yes: pantyliner 2 and change just for freshness   INTERCOURSE: not active at this time so will not be addressed Pain with intercourse: Initial Penetration, During Penetration, and Pain Interrupts Intercourse Ability to have vaginal penetration:  Yes:   Marinoff Scale: 3/3   PREGNANCY: Vaginal deliveries 1 Tearing Yes: not sure how much C-section deliveries 1     PROLAPSE: None     OBJECTIVE:    DIAGNOSTIC FINDINGS:  none     COGNITION: Overall cognitive status: Within functional limits for tasks assessed                                POSTURE: No Significant postural limitations   PELVIC ALIGNMENT: in correct alignment  LOWER EXTREMITY ROM: bilateral hip ROM is full     LOWER EXTREMITY MMT:    MMT Right eval Left eval Right/left 03/20/23  Hip extension 4/5 4/5 4/5  Hip abduction 4/5 4/5 4/5  Hip adduction 4/5 4/5 4/5    PALPATION:   General  tenderness and tightness in the lower abdomen, good movement of the lower rib cage.                  External Perineal Exam intact                             Internal Pelvic Floor tenderness located on the left levator ani and obturator internist, restrictions on the urethra, tightness along the introitus   Patient confirms identification and approves PT to assess internal pelvic floor and treatment Yes   PELVIC MMT:   MMT eval 02/18/23 03/13/23 03/20/23 04/03/23 04/17/23  Vaginal 2/5 ant and post.  1/5 sides Started as 2/5 then at end had the smallest lift when she breathed out Weak 3/5 3/5 anterior and posterior and sides is 2/5 and needs tactile cues 3/5 with circular contraction 3/5 with circular contraction  (Blank rows = not tested)         TONE: Increased at entrance   PROLAPSE: none   TODAY'S TREATMENT:   05/22/23 Kinesio tape Placed along the spine to reduce flexion of spine during exercise then one across the interscapular area to keep scapula retracted Neuromuscular re-education: Form correction: Hinging at hips with keeping spine straight and scapula retracted Standing with shoulder horizontal abduction with tactile cues to engage the lower abdomen and keeping shoulders down Standing with bringing the pole overhead with tactile cues to engage the lower abdomen Standing with back against the wall with moving the shoulder into abduction keeping core and pelvic floor engaged Standing facing the wall and moving arms up wall then away keeping the core and pelvic floor engaged Doorway stretch with keeping pelvic floor and core engaged Shoulder IR and ER isometric with pelvic floor and core engaged Bridge with correct spinal alignment      04/17/23 Manual: Internal pelvic floor techniques: No emotional/communication  barriers or cognitive limitation. Patient is motivated to learn. Patient understands and agrees with treatment goals and plan. PT explains patient will be examined in standing, sitting, and lying down to see how their muscles and joints work. When they are ready, they will be asked to remove their underwear so PT can examine their perineum. The patient is also given the option of providing their own chaperone as one is not provided in our facility. The patient also has the right and is explained the right to defer or refuse any part of the evaluation or treatment including the internal exam. With the patient's consent, PT will use one gloved finger to gently assess the muscles of the pelvic floor, seeing how well it contracts and relaxes and if there is muscle symmetry. After, the patient will get dressed and PT and patient will discuss exam findings and plan of care. PT and patient discuss plan of care, schedule, attendance policy and HEP activities. Going through the vaginal canal working on the perineal body and levator ani Dry needling: Neuromuscular re-education: Pelvic floor contraction training: Therapist index finger working with patient on pelvic floor contraction with tactile cues Educated patient on pelvic floor home biofeedback units that she can look into Exercises: Strengthening: Curl up with ball between  back to limit motion 5 times Diagonal curl up with ball between back and mat to limit motion 5 times each side Stand press ball into wall with the pelvic floor exercises  04/03/23 Manual: Internal pelvic floor techniques: No emotional/communication barriers or cognitive limitation. Patient is motivated to learn. Patient understands and agrees with treatment goals and plan. PT explains patient will be examined in standing, sitting, and lying down to see how their muscles and joints work. When they are ready, they will be asked to remove their underwear so PT can examine their perineum.  The patient is also given the option of providing their own chaperone as one is not provided in our facility. The patient also has the right and is explained the right to defer or refuse any part of the evaluation or treatment including the internal exam. With the patient's consent, PT will use one gloved finger to gently assess the muscles of the pelvic floor, seeing how well it contracts and relaxes and if there is muscle symmetry. After, the patient will get dressed and PT and patient will discuss exam findings and plan of care. PT and patient discuss plan of care, schedule, attendance policy and HEP activities.  Going through the vaginal canal working on the introitus and perineal body, ischiocavernosus, and urogenital diaphragm Exercises: Strengthening: Laying on side hip adduction 15 X with pelvic floor contraction and press top hand into ball Curl up with ball between back to limit motion 5 times Diagonal curl up with ball between back and mat to limit motion 5 times each side Bridge with ball between knees and squeeze ball between hands and move shoulder up and down     PATIENT EDUCATION:  03/20/23 Education details: Access Code: [email or web address] Person educated: Patient Education method: Programmer, multimedia, Demonstration, Tactile cues, Verbal cues, and Handouts Education comprehension: verbalized understanding, returned demonstration, verbal cues required, tactile cues required, and needs further education   HOME EXERCISE PROGRAM: 03/1723 Access Code: [email or web address] URL: [email or web address] Date: 02/18/2023 Prepared by: Eulis Foster  Exercises - Supine Diaphragmatic Breathing  - 1 x daily - 7 x weekly - 1 sets - 10 reps - Seated Pelvic Floor Muscles Isometrics on Towel Roll  - 1 x daily - 7 x weekly - 1 sets - 10 reps - 5-8s holds - Quadruped Transversus Abdominis Bracing  - 1 x daily - 7 x weekly - 1 sets - 10 reps - Cat Cow  - 1 x daily - 7 x weekly - 1 sets - 10 reps - Sidelying  Thoracic Rotation with Open Book  - 1 x daily - 7 x weekly - 1 sets - 2-3 reps - 30-45s holds - Supine Pelvic Floor Contraction  - 3 x daily - 7 x weekly - 10 reps - 5 sec hold - Quick Flick Pelvic Floor Contractions in Hooklying  - 1 x daily - 7 x weekly - 1 sets - 5 reps - Hooklying Isometric Hip Flexion  - 1 x daily - 7 x weekly - 1 sets - 10 reps   ASSESSMENT:   CLINICAL IMPRESSION: Patient is a 67 y.o. female who was seen today for physical therapy  treatment for chronic cystitis without hematuria.  Patient is wearing 2 pads per day instead of 4. Therapy consisted with therapist going over her past exercises that are done for her neck and shoulder to incorporate pelvic floor and core. She would leak when she does repetitive exercises, cough and sneeze. Patient needs to work on quick flicks.  Patient will benefit from skilled therapy to improve pelvic floor coordination to reduce leakage.    OBJECTIVE IMPAIRMENTS: decreased activity tolerance, decreased endurance, decreased strength, increased fascial restrictions, and increased muscle spasms.    ACTIVITY LIMITATIONS: continence and locomotion level   PARTICIPATION LIMITATIONS: shopping and community activity   PERSONAL FACTORS: 3+ comorbidities: COPD; MVP; Osteoporosis; Cervical laminectomy 2006; cesarean section 1993; total laparoscopic hysterectomy with salpingectomy;   are also affecting patient's functional outcome.    REHAB POTENTIAL: Excellent   CLINICAL DECISION MAKING: Stable/uncomplicated   EVALUATION COMPLEXITY: Low     GOALS: Goals reviewed with patient? Yes   SHORT TERM GOALS: Target date: 01/18/23   Patient independent with initial HEP for diaphragmatic breathing with pelvic floor drop.  Baseline: Goal status: Met 01/16/23   2.  Patient is independent with perineal massage to lengthen tissue.  Baseline:  Goal status: Met 02/27/23       LONG TERM GOALS: Target date:06/12/23   Patient independent with advanced HEP  for core and pelvic floor strength.  Baseline:  Goal status: ongoing 03/20/23   2.  Patient pelvic floor strength is >/= 3/5 with circular hug of therapist finger.  Baseline: takes several tries Goal status: ongoing 03/20/23   3.  Patient is able to cough, sneeze and laugh with 75% less urinary leakage.  Baseline:  Goal status: ongoing 03/20/23   4.  Patient is able to lift 10 #  and walk without leaking urine due to improved contractions.  Baseline:  Goal status: Met 03/20/23     PLAN:   PT FREQUENCY: 1x/week   PT DURATION: 12 weeks   PLANNED INTERVENTIONS: Therapeutic exercises, Therapeutic activity, Neuromuscular re-education, Patient/Family education, Dry Needling, Electrical stimulation, Biofeedback, and Manual therapy   PLAN FOR NEXT SESSION:  manual work around the introitus on the right side; quick flicks, renewal if needed   Eulis Foster, PT 05/22/23 2:45 PM

## 2023-05-28 IMAGING — DX DG CHEST 2V
2 series · 2 of 2 positions shown · non-contrast
Comparison: None.

CLINICAL DATA: Shortness of breath. Recent Wovid-22 viral
infection.

EXAM:
CHEST - 2 VIEW

[chest pa]
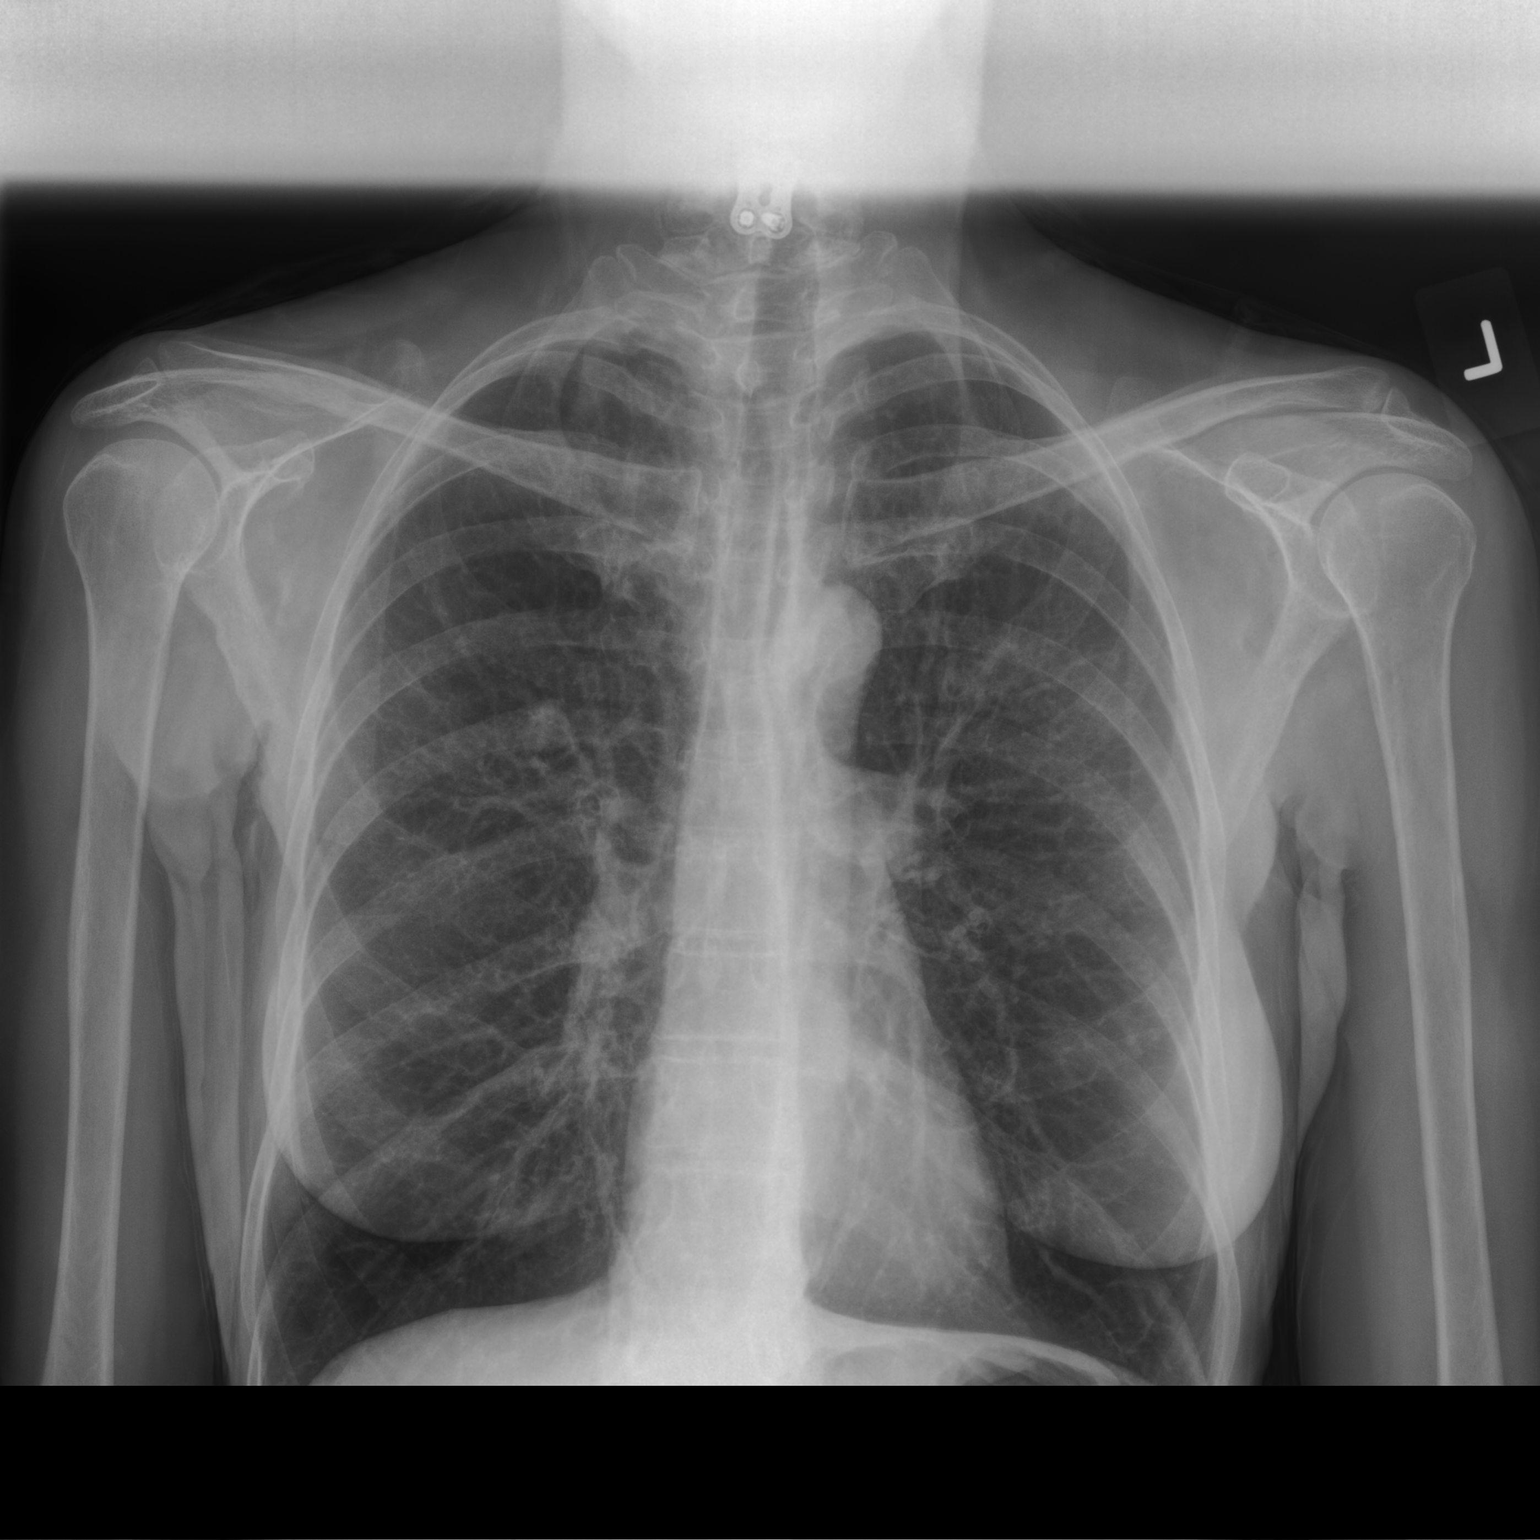

[chest lat]
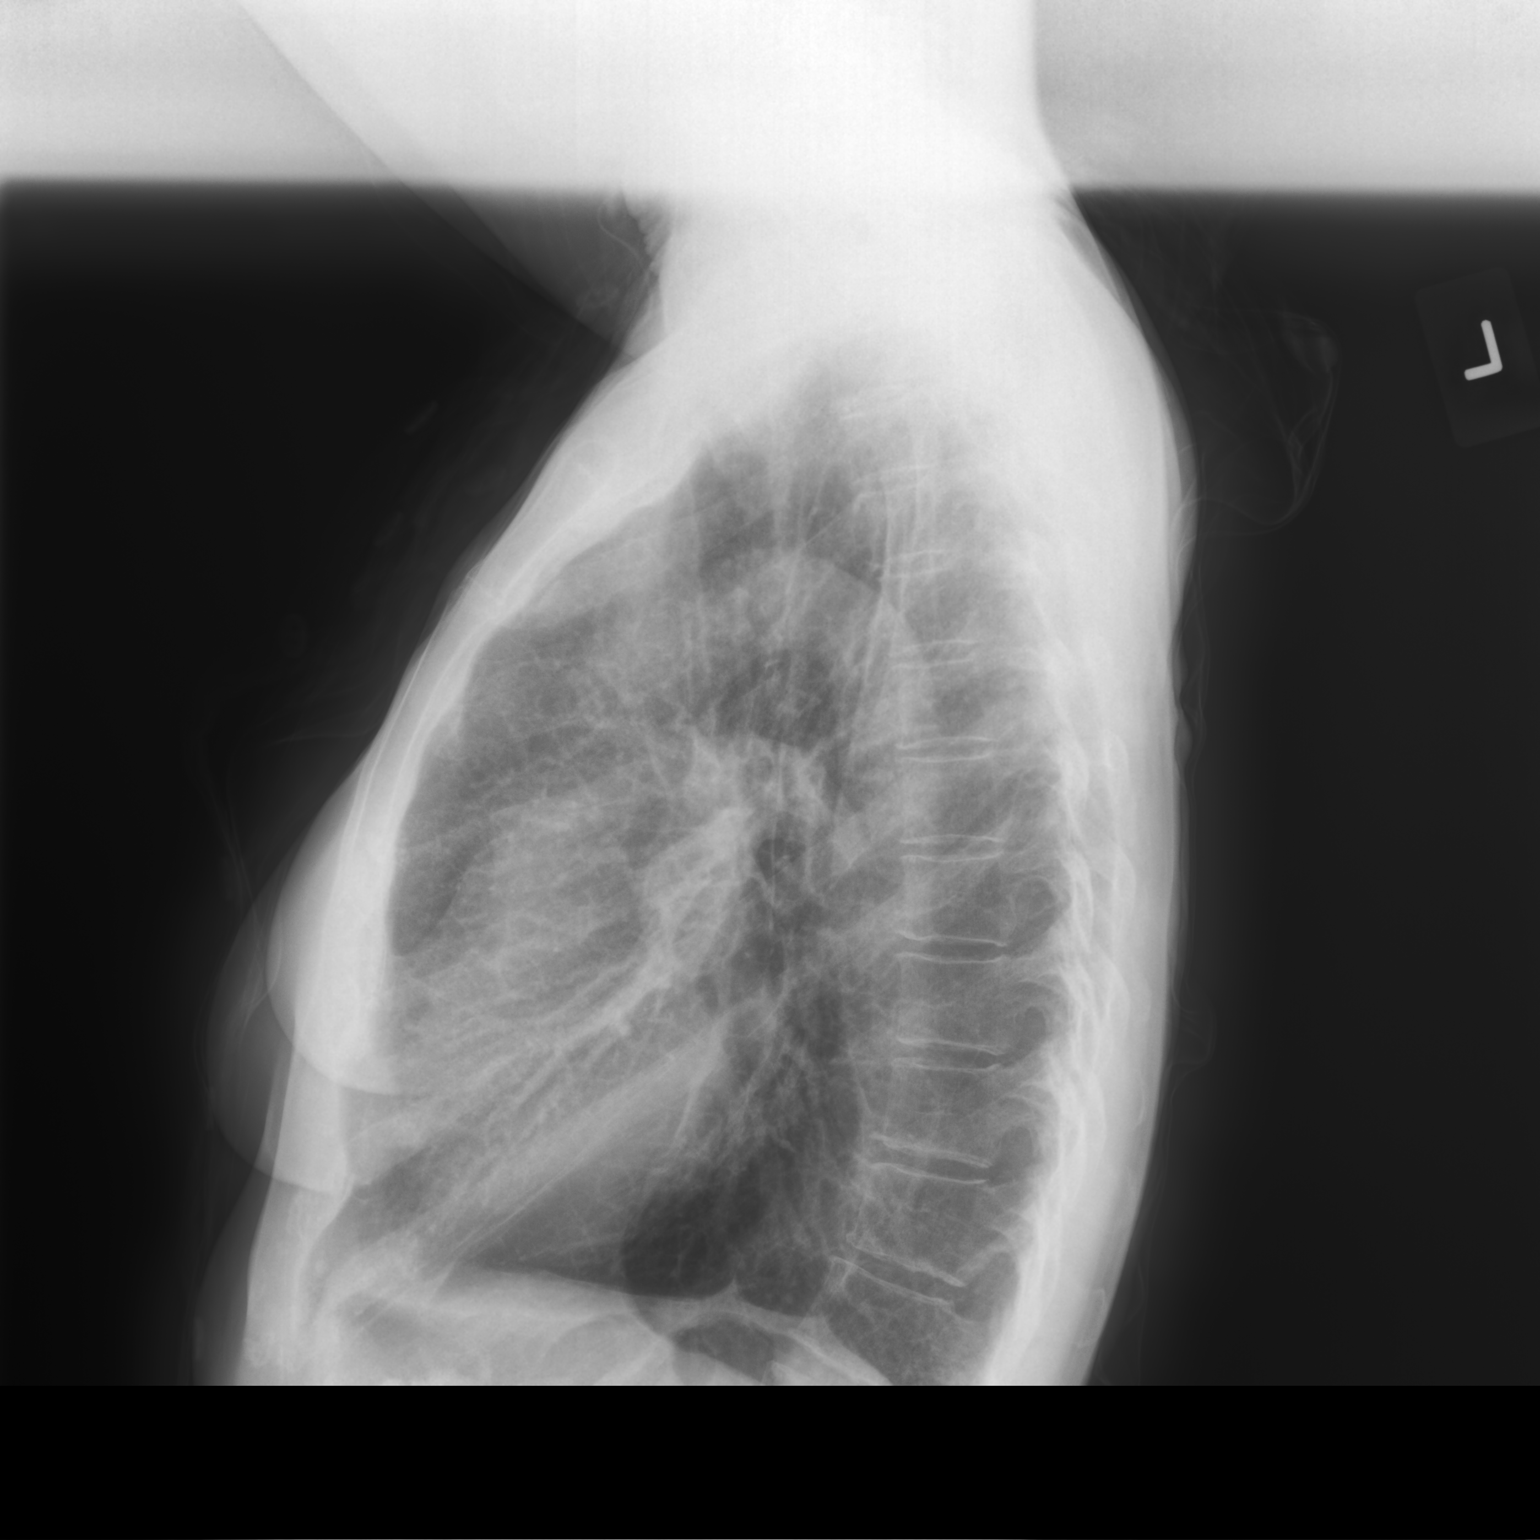

[2 of 2 positions shown; findings below may reference images not displayed]

FINDINGS: The heart size and mediastinal contours are within normal limits.
Pulmonary hyperinflation again seen, suspicious for COPD.

No evidence of pulmonary infiltrate or edema. No evidence of pleural
effusion. A nodular density is seen in the central right upper lobe
which was not seen on previous study. Cervical spine fusion hardware
again noted.
IMPRESSION: Nodular density in central right upper lobe, new since prior study.
Recommend chest CT without contrast to exclude pulmonary neoplasm.

COPD.

These results will be called to the ordering clinician or
representative by the Radiologist Assistant, and communication
documented in the PACS or [REDACTED].

## 2023-05-29 ENCOUNTER — Other Ambulatory Visit: Payer: Self-pay | Admitting: Family Medicine

## 2023-06-03 ENCOUNTER — Other Ambulatory Visit: Payer: Self-pay | Admitting: Family Medicine

## 2023-06-05 ENCOUNTER — Encounter: Payer: Self-pay | Admitting: Physical Therapy

## 2023-06-05 ENCOUNTER — Ambulatory Visit: Payer: 59 | Attending: Urology | Admitting: Physical Therapy

## 2023-06-05 DIAGNOSIS — R293 Abnormal posture: Secondary | ICD-10-CM | POA: Diagnosis present

## 2023-06-05 DIAGNOSIS — M6281 Muscle weakness (generalized): Secondary | ICD-10-CM | POA: Diagnosis present

## 2023-06-05 DIAGNOSIS — R279 Unspecified lack of coordination: Secondary | ICD-10-CM | POA: Diagnosis present

## 2023-06-05 NOTE — Therapy (Signed)
OUTPATIENT PHYSICAL THERAPY TREATMENT NOTE   Patient Name: Emily Ward MRN: [id number] DOB:Jan 02, 1956, 67 y.o., female Today's Date: 06/05/2023  PCP: Gershon Crane, MD  REFERRING PROVIDER: Crist Fat, MD   END OF SESSION:   PT End of Session - 06/05/23 1355     Visit Number 12    Date for PT Re-Evaluation 12/11/23    Authorization Type [employer]    PT Start Time 1400    PT Stop Time 1440    PT Time Calculation (min) 40 min    Activity Tolerance Patient tolerated treatment well    Behavior During Therapy [facility] for tasks assessed/performed             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     Fibromyalgia    sees Dr. 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 Norwood Levo, adenmatous polyp, repeat in 6 months   CYSTOSCOPY N/A 09/01/2018   Procedure: CYSTOSCOPY;  Surgeon: Jerene Bears, MD;  Location: [facility];  Service: Gynecology;  Laterality: N/A;   DILATATION & CURETTAGE/HYSTEROSCOPY WITH MYOSURE N/A 04/12/2017   Procedure: DILATATION & CURETTAGE/HYSTEROSCOPY;  Surgeon: Jerene Bears, MD;  Location: [facility];  Service: Gynecology;  Laterality: N/A;    epidural thoracic spine injection  06/07/2022   facet joint block  03/30/2021   per patient, performed by Dr. Alvester Morin   facet joint block  01/31/2022   per patient, performed by Dr. Alvester Morin   HYSTEROSCOPY     years ago with Dr Arelia Sneddon-? 2000   KNEE ARTHROSCOPY Left 10/24/2022   TOTAL LAPAROSCOPIC HYSTERECTOMY WITH SALPINGECTOMY Bilateral 09/01/2018   Procedure: TOTAL LAPAROSCOPIC HYSTERECTOMY WITH SALPINGECTOMY  BSO, REPAIR OF PERINEAL LACERATION;  Surgeon: Jerene Bears, MD;  Location: [facility];  Service: Gynecology;  Laterality: Bilateral;  possible BSO   URETHRAL DILATION     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   Palpitations 07/30/2017   Shortness of breath 07/30/2017   Hormone replacement therapy (HRT) 03/30/2017   Former smoker 03/30/2017   Other fatigue 06/30/2016   Insomnia 06/30/2016   DJD (degenerative joint disease), cervical 06/30/2016   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/2016   IBS (irritable bowel syndrome) 06/30/2016   Interstitial cystitis 06/30/2016   SHOULDER PAIN, RIGHT 09/05/2010  GERD 03/24/2009   SACROILIAC STRAIN 03/24/2009   Atypical chest pain 01/20/2008   HEMATURIA UNSPECIFIED 10/14/2007   FIBROMYALGIA 07/03/2007   HYPERLIPIDEMIA NEC/NOS 05/16/2007   HEADACHE 05/09/2007    REFERRING DIAG: N30.20 (ICD-10-CM) - Other chronic cystitis without hematuria    THERAPY DIAG:  Unspecified lack of coordination   Muscle weakness (generalized)   Rationale for Evaluation and Treatment: Rehabilitation   ONSET DATE: 04/2022   SUBJECTIVE:                                                                                                                                                                                             SUBJECTIVE STATEMENT: I have had sciatica. I feel like I need to pee all the time. I had one instance of leakage with sneeze. I stopped doing the ball exercises due to the back pain. I saw the doctor and he has me off the antibiotic and continue with cranberry tablets and probiotic.    PAIN:  Are you having pain? Yes: NPRS scale: 2/10 Pain location: lumbar Pain description: constant Aggravating factors: increased at night when lay down Relieving factors: change position     PRECAUTIONS: None   WEIGHT BEARING RESTRICTIONS: No   FALLS:  Has patient fallen in last 6 months? No   LIVING ENVIRONMENT: Lives with: lives with their spouse   OCCUPATION: retired   PLOF: Independent   PATIENT GOALS: improve the urinary leakage   PERTINENT HISTORY:  COPD; MVP; Osteoporosis; Cervical laminectomy 2006; cesarean section 1993; total laparoscopic hysterectomy with salpingectomy;    BOWEL MOVEMENT: Pain with bowel movement: No Type of bowel movement:Type (Bristol Stool Scale) Type 3, Frequency daily, and Strain No Fully empty rectum: Yes:  Leakage: No     URINATION: Pain with urination: No Fully empty bladder: No Stream: Strong when she does not rush to bathroom frequently Urgency: Yes: sometimes Frequency: urinate every 2 hours and sometimes will go longer Leakage: Urge to void, Coughing, Sneezing, Pads: Yes: pantyliner 2 and change just for freshness   INTERCOURSE: not active at this time so will not be addressed Pain with intercourse: Initial Penetration, During Penetration, and Pain Interrupts Intercourse Ability to have vaginal penetration:  Yes:   Marinoff Scale: 3/3   PREGNANCY: Vaginal deliveries 1 Tearing Yes: not sure how much C-section deliveries 1     PROLAPSE: None     OBJECTIVE:    DIAGNOSTIC FINDINGS:  none     COGNITION: Overall cognitive status: Within functional limits for tasks assessed  POSTURE: No Significant postural limitations   PELVIC ALIGNMENT: in correct alignment 06/05/23; left ilium rotated posteriorly     LOWER EXTREMITY ROM: bilateral hip ROM is full     LOWER EXTREMITY MMT:   MMT Right eval Left eval Right/left 03/20/23 Right/left 06/05/23  Hip extension 4/5 4/5 4/5 4+/5  Hip abduction 4/5 4/5 4/5 4+/5  Hip adduction 4/5 4/5 4/5 4+/5    PALPATION:   General  tenderness and tightness in the lower abdomen, good movement of the lower rib cage.  06/05/23 good movement of lower rib cage and no tightness in the lower abdominals                 External Perineal Exam intact                             Internal Pelvic Floor tenderness located on the left levator ani and obturator internist, restrictions on the urethra, tightness along the introitus   Patient confirms identification and approves PT to assess internal pelvic floor and treatment Yes   PELVIC MMT:   MMT eval 02/18/23 03/13/23 03/20/23 04/03/23 04/17/23 06/05/23  Vaginal 2/5 ant and post.  1/5 sides Started as 2/5 then at end had the smallest lift when she breathed out Weak 3/5 3/5 anterior and posterior and sides is 2/5 and needs tactile cues 3/5 with circular contraction 3/5 with circular contraction 3/5 with circular contraction after manual work  (Blank rows = not tested)         TONE: Increased at entrance   PROLAPSE: none   TODAY'S TREATMENT:   06/05/23 Manual: Spinal mobilization: Muscle energy technique to left SI to correct posterior rotation.  Internal pelvic floor techniques: No emotional/communication barriers or cognitive limitation. Patient is motivated to learn. Patient understands and agrees with treatment goals and plan. PT explains patient will be examined in standing, sitting, and lying down to see how their muscles and joints work. When they are ready, they will be asked to remove their underwear so PT can examine their perineum. The patient is also  given the option of providing their own chaperone as one is not provided in our facility. The patient also has the right and is explained the right to defer or refuse any part of the evaluation or treatment including the internal exam. With the patient's consent, PT will use one gloved finger to gently assess the muscles of the pelvic floor, seeing how well it contracts and relaxes and if there is muscle symmetry. After, the patient will get dressed and PT and patient will discuss exam findings and plan of care. PT and patient discuss plan of care, schedule, attendance policy and HEP activities.  Going through the vagina working on the right levator ani and obturator internist Going through the vagina working on the left levator ani and obturator internist with movement of the left hip and mobilization of the left SI joint Neuromuscular re-education: Pelvic floor contraction training: Therapist finger in the vaginal canal working on pelvic floor contraction starting at 2/5 with no hug ending with 3/5 with hug and lift  E 05/22/23 Kinesio tape Placed along the spine to reduce flexion of spine during exercise then one across the interscapular area to keep scapula retracted Neuromuscular re-education: Form correction: Hinging at hips with keeping spine straight and scapula retracted Standing with shoulder horizontal abduction with tactile cues to engage the lower abdomen and keeping shoulders down Standing with  bringing the pole overhead with tactile cues to engage the lower abdomen Standing with back against the wall with moving the shoulder into abduction keeping core and pelvic floor engaged Standing facing the wall and moving arms up wall then away keeping the core and pelvic floor engaged Doorway stretch with keeping pelvic floor and core engaged Shoulder IR and ER isometric with pelvic floor and core engaged Bridge with correct spinal alignment      04/17/23 Manual: Internal pelvic floor  techniques: No emotional/communication barriers or cognitive limitation. Patient is motivated to learn. Patient understands and agrees with treatment goals and plan. PT explains patient will be examined in standing, sitting, and lying down to see how their muscles and joints work. When they are ready, they will be asked to remove their underwear so PT can examine their perineum. The patient is also given the option of providing their own chaperone as one is not provided in our facility. The patient also has the right and is explained the right to defer or refuse any part of the evaluation or treatment including the internal exam. With the patient's consent, PT will use one gloved finger to gently assess the muscles of the pelvic floor, seeing how well it contracts and relaxes and if there is muscle symmetry. After, the patient will get dressed and PT and patient will discuss exam findings and plan of care. PT and patient discuss plan of care, schedule, attendance policy and HEP activities. Going through the vaginal canal working on the perineal body and levator ani Dry needling: Neuromuscular re-education: Pelvic floor contraction training: Therapist index finger working with patient on pelvic floor contraction with tactile cues Educated patient on pelvic floor home biofeedback units that she can look into Exercises: Strengthening: Curl up with ball between back to limit motion 5 times Diagonal curl up with ball between back and mat to limit motion 5 times each side Stand press ball into wall with the pelvic floor exercises     PATIENT EDUCATION:  03/20/23 Education details: Access Code: [email or web address] Person educated: Patient Education method: Explanation, Demonstration, Tactile cues, Verbal cues, and Handouts Education comprehension: verbalized understanding, returned demonstration, verbal cues required, tactile cues required, and needs further education   HOME EXERCISE PROGRAM: 03/1723 Access  Code: [email or web address] URL: [email or web address] Date: 02/18/2023 Prepared by: Eulis Foster  Exercises - Supine Diaphragmatic Breathing  - 1 x daily - 7 x weekly - 1 sets - 10 reps - Seated Pelvic Floor Muscles Isometrics on Towel Roll  - 1 x daily - 7 x weekly - 1 sets - 10 reps - 5-8s holds - Quadruped Transversus Abdominis Bracing  - 1 x daily - 7 x weekly - 1 sets - 10 reps - Cat Cow  - 1 x daily - 7 x weekly - 1 sets - 10 reps - Sidelying Thoracic Rotation with Open Book  - 1 x daily - 7 x weekly - 1 sets - 2-3 reps - 30-45s holds - Supine Pelvic Floor Contraction  - 3 x daily - 7 x weekly - 10 reps - 5 sec hold - Quick Flick Pelvic Floor Contractions in Hooklying  - 1 x daily - 7 x weekly - 1 sets - 5 reps - Hooklying Isometric Hip Flexion  - 1 x daily - 7 x weekly - 1 sets - 10 reps   ASSESSMENT:   CLINICAL IMPRESSION: Patient is a 67 y.o. female who was seen today for physical therapy  treatment for chronic cystitis without hematuria.  Patient is wearing 2 pads per day instead of 4. Patient does not have to double up on pads when she goes out. Patient does not have fecal leakage. Patient started with back pain yesterday and it increased urgency afterwards.  Patient is able to cough, sneeze and laugh with 60% less urinary leakage. Patient had no back pain after therapy. Her pelvic floor strength started as 2/5 with no hug of therapist finger and after manual work became 3/5 with hug and lift of therapist finger. Patient will benefit from skilled therapy to improve pelvic floor coordination to reduce leakage.    OBJECTIVE IMPAIRMENTS: decreased activity tolerance, decreased endurance, decreased strength, increased fascial restrictions, and increased muscle spasms.    ACTIVITY LIMITATIONS: continence and locomotion level   PARTICIPATION LIMITATIONS: shopping and community activity   PERSONAL FACTORS: 3+ comorbidities: COPD; MVP; Osteoporosis; Cervical laminectomy 2006; cesarean  section 1993; total laparoscopic hysterectomy with salpingectomy;   are also affecting patient's functional outcome.    REHAB POTENTIAL: Excellent   CLINICAL DECISION MAKING: Stable/uncomplicated   EVALUATION COMPLEXITY: Low     GOALS: Goals reviewed with patient? Yes   SHORT TERM GOALS: Target date: 01/18/23   Patient independent with initial HEP for diaphragmatic breathing with pelvic floor drop.  Baseline: Goal status: Met 01/16/23   2.  Patient is independent with perineal massage to lengthen tissue.  Baseline:  Goal status: Met 02/27/23       LONG TERM GOALS: Target date:12/11/23   Patient independent with advanced HEP for core and pelvic floor strength.  Baseline:  Goal status: ongoing 03/20/23   2.  Patient pelvic floor strength is >/= 3/5 with circular hug of therapist finger.  Baseline: takes several tries Goal status: ongoing 03/20/23   3.  Patient is able to cough, sneeze and laugh with 75% less urinary leakage.  Baseline: 65% better.  Goal status: ongoing 06/05/23   4.  Patient is able to lift 10 #  and walk without leaking urine due to improved contractions.  Baseline:  Goal status: Met 03/20/23     PLAN:   PT FREQUENCY: 1x/week   PT DURATION: 12 weeks   PLANNED INTERVENTIONS: Therapeutic exercises, Therapeutic activity, Neuromuscular re-education, Patient/Family education, Dry Needling, Electrical stimulation, Biofeedback, and Manual therapy   PLAN FOR NEXT SESSION:  manual work around the introitus on the right side; quick flicks, SI stabilization on the left  Eulis Foster, PT 06/05/23 2:21 PM

## 2023-06-06 NOTE — Telephone Encounter (Signed)
Duplicate. Already sent to PCP for approval

## 2023-06-06 NOTE — Telephone Encounter (Signed)
Pt Rx did not go through, needs e scribe since its a control

## 2023-06-06 NOTE — Telephone Encounter (Signed)
Pharmacy does not have rx for butalbital-acetaminophen-caffeine (FIORICET) 50-325-40 MG tablet and patient says she does not have a printed copy   [id number] [place], [place] - [address] AT [facility] [address] Phone: [phone]  Fax: [phone]

## 2023-06-07 NOTE — Telephone Encounter (Signed)
Pt is calling checking on rx and would like today

## 2023-06-10 ENCOUNTER — Encounter: Payer: Self-pay | Admitting: Physical Therapy

## 2023-06-10 ENCOUNTER — Other Ambulatory Visit: Payer: Self-pay | Admitting: Family Medicine

## 2023-06-10 ENCOUNTER — Ambulatory Visit: Payer: 59 | Admitting: Physical Therapy

## 2023-06-10 DIAGNOSIS — M6281 Muscle weakness (generalized): Secondary | ICD-10-CM

## 2023-06-10 DIAGNOSIS — R293 Abnormal posture: Secondary | ICD-10-CM

## 2023-06-10 DIAGNOSIS — R279 Unspecified lack of coordination: Secondary | ICD-10-CM

## 2023-06-10 MED ORDER — BUTALBITAL-APAP-CAFFEINE 50-325-40 MG PO TABS: 1.0000 | ORAL_TABLET | Freq: Four times a day (QID) | ORAL | 1 refills | Status: DC | PRN

## 2023-06-10 NOTE — Telephone Encounter (Signed)
Pt called to say Pharmacy keeps telling her this Rx has been denied.  Pt would like to know why and what can be done to fix it? Please advise.

## 2023-06-10 NOTE — Telephone Encounter (Signed)
Done

## 2023-06-10 NOTE — Therapy (Addendum)
OUTPATIENT PHYSICAL THERAPY TREATMENT NOTE   Patient Name: Emily Ward MRN: [id number] DOB:08-20-1956, 67 y.o., female Today's Date: 06/10/2023  PCP: Gershon Crane, MD  REFERRING PROVIDER: Crist Fat, MD   END OF SESSION:   PT End of Session - 06/10/23 1405     Visit Number 13    Date for PT Re-Evaluation 12/11/23    Authorization Type [employer]    PT Start Time 1400    PT Stop Time 1440    PT Time Calculation (min) 40 min    Activity Tolerance Patient tolerated treatment well    Behavior During Therapy [facility] for tasks assessed/performed             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     Fibromyalgia    sees Dr. 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 Norwood Levo, adenmatous polyp, repeat in 6 months   CYSTOSCOPY N/A 09/01/2018   Procedure: CYSTOSCOPY;  Surgeon: Jerene Bears, MD;  Location: [facility];  Service: Gynecology;  Laterality: N/A;   DILATATION & CURETTAGE/HYSTEROSCOPY WITH MYOSURE N/A 04/12/2017   Procedure: DILATATION & CURETTAGE/HYSTEROSCOPY;  Surgeon: Jerene Bears, MD;  Location: [facility];  Service: Gynecology;  Laterality: N/A;    epidural thoracic spine injection  06/07/2022   facet joint block  03/30/2021   per patient, performed by Dr. Alvester Morin   facet joint block  01/31/2022   per patient, performed by Dr. Alvester Morin   HYSTEROSCOPY     years ago with Dr Arelia Sneddon-? 2000   KNEE ARTHROSCOPY Left 10/24/2022   TOTAL LAPAROSCOPIC HYSTERECTOMY WITH SALPINGECTOMY Bilateral 09/01/2018   Procedure: TOTAL LAPAROSCOPIC HYSTERECTOMY WITH SALPINGECTOMY  BSO, REPAIR OF PERINEAL LACERATION;  Surgeon: Jerene Bears, MD;  Location: [facility];  Service: Gynecology;  Laterality: Bilateral;  possible BSO   URETHRAL DILATION     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   Palpitations 07/30/2017   Shortness of breath 07/30/2017   Hormone replacement therapy (HRT) 03/30/2017   Former smoker 03/30/2017   Other fatigue 06/30/2016   Insomnia 06/30/2016   DJD (degenerative joint disease), cervical 06/30/2016   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/2016   IBS (irritable bowel syndrome) 06/30/2016   Interstitial cystitis 06/30/2016   SHOULDER PAIN, RIGHT 09/05/2010  GERD 03/24/2009   SACROILIAC STRAIN 03/24/2009   Atypical chest pain 01/20/2008   HEMATURIA UNSPECIFIED 10/14/2007   FIBROMYALGIA 07/03/2007   HYPERLIPIDEMIA NEC/NOS 05/16/2007   HEADACHE 05/09/2007    REFERRING DIAG: N30.20 (ICD-10-CM) - Other chronic cystitis without hematuria    THERAPY DIAG:  Unspecified lack of coordination   Muscle weakness (generalized)   Rationale for Evaluation and Treatment: Rehabilitation   ONSET DATE: 04/2022   SUBJECTIVE:                                                                                                                                                                                             SUBJECTIVE STATEMENT: I coughed and leaked one time since last visit. My sciatica is doing better. I did the Estrace cream last night. I did an internal massage with the cream.    PAIN:  Are you having pain? Yes: NPRS scale: 2/10 Pain location: lumbar Pain description: constant Aggravating factors: increased at night when lay down Relieving factors: change position     PRECAUTIONS: None   WEIGHT BEARING RESTRICTIONS: No   FALLS:  Has patient fallen in last 6 months? No   LIVING ENVIRONMENT: Lives with: lives with their spouse   OCCUPATION: retired   PLOF: Independent   PATIENT GOALS: improve the urinary leakage   PERTINENT HISTORY:  COPD; MVP; Osteoporosis; Cervical laminectomy 2006; cesarean section 1993; total laparoscopic hysterectomy with salpingectomy;    BOWEL MOVEMENT: Pain with bowel movement: No Type of bowel movement:Type (Bristol Stool Scale) Type 3, Frequency daily, and Strain No Fully empty rectum: Yes:  Leakage: No     URINATION: Pain with urination: No Fully empty bladder: No Stream: Strong when she does not rush to bathroom frequently Urgency: Yes: sometimes Frequency: urinate every 2 hours and sometimes will go longer Leakage: Urge to void, Coughing, Sneezing, Pads: Yes: pantyliner 2 and change just for freshness   INTERCOURSE: not active at this time so will not be addressed Pain with intercourse: Initial Penetration, During Penetration, and Pain Interrupts Intercourse Ability to have vaginal penetration:  Yes:   Marinoff Scale: 3/3   PREGNANCY: Vaginal deliveries 1 Tearing Yes: not sure how much C-section deliveries 1     PROLAPSE: None     OBJECTIVE:    DIAGNOSTIC FINDINGS:  none     COGNITION: Overall cognitive status: Within functional limits for tasks assessed                                POSTURE: No Significant postural limitations   PELVIC ALIGNMENT: in  correct  alignment 06/05/23; left ilium rotated posteriorly     LOWER EXTREMITY ROM: bilateral hip ROM is full     LOWER EXTREMITY MMT:   MMT Right eval Left eval Right/left 03/20/23 Right/left 06/05/23  Hip extension 4/5 4/5 4/5 4+/5  Hip abduction 4/5 4/5 4/5 4+/5  Hip adduction 4/5 4/5 4/5 4+/5    PALPATION:   General  tenderness and tightness in the lower abdomen, good movement of the lower rib cage.  06/05/23 good movement of lower rib cage and no tightness in the lower abdominals                 External Perineal Exam intact                             Internal Pelvic Floor tenderness located on the left levator ani and obturator internist, restrictions on the urethra, tightness along the introitus   Patient confirms identification and approves PT to assess internal pelvic floor and treatment Yes   PELVIC MMT:   MMT eval 02/18/23 03/13/23 03/20/23 04/03/23 04/17/23 06/05/23 06/10/23  Vaginal 2/5 ant and post.  1/5 sides Started as 2/5 then at end had the smallest lift when she breathed out Weak 3/5 3/5 anterior and posterior and sides is 2/5 and needs tactile cues 3/5 with circular contraction 3/5 with circular contraction 3/5 with circular contraction after manual work 3/5 after tactile cues  (Blank rows = not tested)         TONE: Increased at entrance   PROLAPSE: none   TODAY'S TREATMENT:   06/10/23 Manual: Internal pelvic floor techniques: No emotional/communication barriers or cognitive limitation. Patient is motivated to learn. Patient understands and agrees with treatment goals and plan. PT explains patient will be examined in standing, sitting, and lying down to see how their muscles and joints work. When they are ready, they will be asked to remove their underwear so PT can examine their perineum. The patient is also given the option of providing their own chaperone as one is not provided in our facility. The patient also has the right and is explained the right to defer or  refuse any part of the evaluation or treatment including the internal exam. With the patient's consent, PT will use one gloved finger to gently assess the muscles of the pelvic floor, seeing how well it contracts and relaxes and if there is muscle symmetry. After, the patient will get dressed and PT and patient will discuss exam findings and plan of care. PT and patient discuss plan of care, schedule, attendance policy and HEP activities.  Going through the vaginal canal working on the levator ani, obturator internist and Alcock's canal with hip movements to lengthen the tissue Neuromuscular re-education: Core facilitation: Supine hips at 90/90  going back and forth to contract the lower abdominals.  Pelvic floor contraction training: Therapist finger in the vaginal canal working on pelvic floor contraction, VC to not bulge instead to lift and contract    06/05/23 Manual: Spinal mobilization: Muscle energy technique to left SI to correct posterior rotation.  Internal pelvic floor techniques: No emotional/communication barriers or cognitive limitation. Patient is motivated to learn. Patient understands and agrees with treatment goals and plan. PT explains patient will be examined in standing, sitting, and lying down to see how their muscles and joints work. When they are ready, they will be asked to remove their underwear so PT can examine their perineum.  The patient is also given the option of providing their own chaperone as one is not provided in our facility. The patient also has the right and is explained the right to defer or refuse any part of the evaluation or treatment including the internal exam. With the patient's consent, PT will use one gloved finger to gently assess the muscles of the pelvic floor, seeing how well it contracts and relaxes and if there is muscle symmetry. After, the patient will get dressed and PT and patient will discuss exam findings and plan of care. PT and patient  discuss plan of care, schedule, attendance policy and HEP activities.  Going through the vagina working on the right levator ani and obturator internist Going through the vagina working on the left levator ani and obturator internist with movement of the left hip and mobilization of the left SI joint Neuromuscular re-education: Pelvic floor contraction training: Therapist finger in the vaginal canal working on pelvic floor contraction starting at 2/5 with no hug ending with 3/5 with hug and lift   05/22/23 Kinesio tape Placed along the spine to reduce flexion of spine during exercise then one across the interscapular area to keep scapula retracted Neuromuscular re-education: Form correction: Hinging at hips with keeping spine straight and scapula retracted Standing with shoulder horizontal abduction with tactile cues to engage the lower abdomen and keeping shoulders down Standing with bringing the pole overhead with tactile cues to engage the lower abdomen Standing with back against the wall with moving the shoulder into abduction keeping core and pelvic floor engaged Standing facing the wall and moving arms up wall then away keeping the core and pelvic floor engaged Doorway stretch with keeping pelvic floor and core engaged Shoulder IR and ER isometric with pelvic floor and core engaged Bridge with correct spinal alignment        PATIENT EDUCATION:  03/20/23 Education details: Access Code: [email or web address] Person educated: Patient Education method: Explanation, Demonstration, Tactile cues, Verbal cues, and Handouts Education comprehension: verbalized understanding, returned demonstration, verbal cues required, tactile cues required, and needs further education   HOME EXERCISE PROGRAM: 03/1723 Access Code: [email or web address] URL: [email or web address] Date: 02/18/2023 Prepared by: Eulis Foster  Exercises - Supine Diaphragmatic Breathing  - 1 x daily - 7 x weekly - 1 sets - 10 reps -  Seated Pelvic Floor Muscles Isometrics on Towel Roll  - 1 x daily - 7 x weekly - 1 sets - 10 reps - 5-8s holds - Quadruped Transversus Abdominis Bracing  - 1 x daily - 7 x weekly - 1 sets - 10 reps - Cat Cow  - 1 x daily - 7 x weekly - 1 sets - 10 reps - Sidelying Thoracic Rotation with Open Book  - 1 x daily - 7 x weekly - 1 sets - 2-3 reps - 30-45s holds - Supine Pelvic Floor Contraction  - 3 x daily - 7 x weekly - 10 reps - 5 sec hold - Quick Flick Pelvic Floor Contractions in Hooklying  - 1 x daily - 7 x weekly - 1 sets - 5 reps - Hooklying Isometric Hip Flexion  - 1 x daily - 7 x weekly - 1 sets - 10 reps   ASSESSMENT:   CLINICAL IMPRESSION: Patient is a 67 y.o. female who was seen today for physical therapy  treatment for chronic cystitis without hematuria.  Patient is able change her lower abdominal exercise to  not strain her neck or back. She had no sciatica pain since  the manual work. Pelvic floor strength is 3/5 after tactile cues to the pelvic floor to contract. Patient has leaked 1 time with a cough since last visit.  Patient will benefit from skilled therapy to improve pelvic floor coordination to reduce leakage.    OBJECTIVE IMPAIRMENTS: decreased activity tolerance, decreased endurance, decreased strength, increased fascial restrictions, and increased muscle spasms.    ACTIVITY LIMITATIONS: continence and locomotion level   PARTICIPATION LIMITATIONS: shopping and community activity   PERSONAL FACTORS: 3+ comorbidities: COPD; MVP; Osteoporosis; Cervical laminectomy 2006; cesarean section 1993; total laparoscopic hysterectomy with salpingectomy;   are also affecting patient's functional outcome.    REHAB POTENTIAL: Excellent   CLINICAL DECISION MAKING: Stable/uncomplicated   EVALUATION COMPLEXITY: Low     GOALS: Goals reviewed with patient? Yes   SHORT TERM GOALS: Target date: 01/18/23   Patient independent with initial HEP for diaphragmatic breathing with pelvic floor  drop.  Baseline: Goal status: Met 01/16/23   2.  Patient is independent with perineal massage to lengthen tissue.  Baseline:  Goal status: Met 02/27/23       LONG TERM GOALS: Target date:12/11/23   Patient independent with advanced HEP for core and pelvic floor strength.  Baseline:  Goal status: ongoing 03/20/23   2.  Patient pelvic floor strength is >/= 3/5 with circular hug of therapist finger.  Baseline: takes several tries Goal status: ongoing 03/20/23   3.  Patient is able to cough, sneeze and laugh with 75% less urinary leakage.  Baseline: 65% better.  Goal status: ongoing 06/05/23   4.  Patient is able to lift 10 #  and walk without leaking urine due to improved contractions.  Baseline:  Goal status: Met 03/20/23     PLAN:   PT FREQUENCY: 1x/week   PT DURATION: 12 weeks   PLANNED INTERVENTIONS: Therapeutic exercises, Therapeutic activity, Neuromuscular re-education, Patient/Family education, Dry Needling, Electrical stimulation, Biofeedback, and Manual therapy   PLAN FOR NEXT SESSION:   quick flicks, SI stabilization on the left  Eulis Foster, PT 06/10/23 2:39 PM  PHYSICAL THERAPY DISCHARGE SUMMARY  Visits from Start of Care: 13  Current functional level related to goals / functional outcomes: See above. Patient called today and informed us she did not need therapy anymore and wanted to be discharged.    Remaining deficits: See above. Patient did not return for a formal assessment for discharge.    Education / Equipment: HEP   Patient agrees to discharge. Patient goals were partially met. Patient is being discharged due to being pleased with the current functional level. Thank you for the referral.  Eulis Foster, PT 09/30/23 10:08 AM

## 2023-06-10 NOTE — Addendum Note (Signed)
Addended by: Gershon Crane A on: 06/10/2023 04:39 PM   Modules accepted: Orders

## 2023-06-19 ENCOUNTER — Other Ambulatory Visit: Payer: Self-pay | Admitting: Family Medicine

## 2023-06-19 NOTE — Progress Notes (Signed)
Office Visit Note  Patient: Emily Ward             Date of Birth: 04-Aug-1956           MRN: [id number]             PCP: Nelwyn Salisbury, MD Referring: Nelwyn Salisbury, MD Visit Date: 07/03/2023 Occupation: @GUAROCC @  Subjective:  Medication management  History of Present Illness: Clayton Schiffman is a 67 y.o. female with osteoarthritis, degenerative disc disease and osteoporosis.  She returns today after her last visit in April 2024.  Patient states that she has been under a lot of stress recently.  She was evaluated and was diagnosed with anxiety and depression.  She was given a dose of Zoloft but she could not tolerate.  She was also given a prescription for Lexapro which she has not started yet.  She states she is trying over-the-counter calm aid and L-theanine which has been helping her with her mood and also with insomnia.  She has been taking quarter tablet of Ambien at bedtime.  She only takes Flexeril as needed for muscle spasm.  She has been going to physical therapy which has been helpful with muscle spasms as well.  She has been getting Prolia injections every 6 months.  Her last Prolia injection was on February 13, 2023.  She has been taking calcium and vitamin D.  Her DEXA scan is scheduled for August 01, 2023.  She continues to have some stiffness in her hands.  She states left knee joint is not painful today.  She has off-and-on stiffness in her neck and lower back.    Activities of Daily Living:  Patient reports morning stiffness for 0 minute.   Patient Reports nocturnal pain.  Difficulty dressing/grooming: Denies Difficulty climbing stairs: Denies Difficulty getting out of chair: Denies Difficulty using hands for taps, buttons, cutlery, and/or writing: Reports  Review of Systems  Constitutional:  Negative for fatigue.  HENT:  Positive for mouth dryness. Negative for mouth sores.   Eyes:  Positive for dryness.  Respiratory:  Negative for shortness of  breath.   Cardiovascular:  Negative for chest pain and palpitations.  Gastrointestinal:  Negative for blood in stool, constipation and diarrhea.  Endocrine: Negative for increased urination.  Genitourinary:  Positive for involuntary urination.  Musculoskeletal:  Positive for joint pain, joint pain, myalgias, muscle weakness, muscle tenderness and myalgias. Negative for gait problem, joint swelling and morning stiffness.  Skin:  Negative for color change, rash, hair loss and sensitivity to sunlight.  Allergic/Immunologic: Positive for susceptible to infections.  Neurological:  Positive for headaches. Negative for dizziness.  Hematological:  Negative for swollen glands.  Psychiatric/Behavioral:  Positive for depressed mood and sleep disturbance. The patient is nervous/anxious.     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   Palpitations 07/30/2017   Shortness of breath 07/30/2017   Hormone replacement therapy (HRT) 03/30/2017   Former smoker 03/30/2017   Other fatigue 06/30/2016   Insomnia 06/30/2016   DJD (degenerative joint disease), cervical 06/30/2016   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/2016   IBS (irritable bowel syndrome) 06/30/2016   Interstitial cystitis 06/30/2016   SHOULDER PAIN, RIGHT 09/05/2010   GERD 03/24/2009   SACROILIAC  STRAIN 03/24/2009   Atypical chest pain 01/20/2008   HEMATURIA UNSPECIFIED 10/14/2007   FIBROMYALGIA 07/03/2007   HYPERLIPIDEMIA NEC/NOS 05/16/2007   HEADACHE 05/09/2007    Past Medical History:  Diagnosis Date   Anxiety    Cervical disc disease 2006   COPD (chronic obstructive pulmonary disease) (HCC)    Depression    Emphysema lung (HCC)    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     Fibromyalgia    sees Dr. 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 brother   Colon cancer Maternal Grandmother    Arthritis Son        psoriatic   Cancer Other        breast/fhx   Heart disease Other        fhx   Esophageal cancer Neg Hx    Rectal cancer Neg Hx    Stomach cancer Neg Hx    Colon polyps Neg Hx    Past Surgical History:  Procedure Laterality Date   BREAST BIOPSY Right 01/24/2023   MM RT BREAST BX W LOC DEV 1ST LESION IMAGE BX SPEC STEREO GUIDE 01/24/2023 [facility]   BREAST BIOPSY Right 01/24/2023   MM RT BREAST BX W LOC DEV EA AD LESION IMG BX SPEC STEREO GUIDE 01/24/2023 [facility]   CERVICAL LAMINECTOMY  2006   CESAREAN SECTION  1993   CHOLECYSTECTOMY  06/2002   COLONOSCOPY  07/19/2022   per Dr. Norwood Levo, adenmatous polyp, repeat in 6 months   CYSTOSCOPY N/A 09/01/2018   Procedure: CYSTOSCOPY;  Surgeon: Jerene Bears, MD;  Location: [facility];  Service: Gynecology;  Laterality: N/A;   DILATATION & CURETTAGE/HYSTEROSCOPY WITH MYOSURE N/A 04/12/2017   Procedure: DILATATION & CURETTAGE/HYSTEROSCOPY;  Surgeon: Jerene Bears, MD;  Location: [facility];  Service: Gynecology;  Laterality: N/A;   epidural thoracic spine injection  06/07/2022   facet joint block  03/30/2021   per patient, performed by Dr. Alvester Morin   facet joint block  01/31/2022   per patient, performed by Dr. Alvester Morin   HYSTEROSCOPY     years ago with Dr Arelia Sneddon-? 2000   KNEE ARTHROSCOPY Left 10/24/2022   TOTAL  LAPAROSCOPIC HYSTERECTOMY WITH SALPINGECTOMY Bilateral 09/01/2018   Procedure: TOTAL LAPAROSCOPIC HYSTERECTOMY WITH SALPINGECTOMY  BSO, REPAIR OF PERINEAL LACERATION;  Surgeon: Jerene Bears, MD;  Location: [facility];  Service: Gynecology;  Laterality: Bilateral;  possible BSO   URETHRAL DILATION     Social History   Social History Narrative   Not on file   Immunization History  Administered Date(s) Administered   Influenza Whole 06/30/2007, 05/04/2010   Influenza,inj,Quad PF,6+ Mos 07/12/2017, 05/23/2018, 09/08/2021   Influenza-Unspecified 06/02/2014, 06/03/2016, 06/20/2020   MMR 06/30/2007   Moderna Sars-Covid-2 Vaccination 09/07/2019, 10/12/2019, 07/13/2020, 02/21/2021   PNEUMOCOCCAL CONJUGATE-20 10/03/2022   Pneumococcal Polysaccharide-23 03/23/2019   Td 09/03/1998,  05/05/2009   Tdap 10/22/2018     Objective: Vital Signs: BP 96/68 (BP Location: Left Arm, Patient Position: Sitting, Cuff Size: Normal)   Pulse 86   Resp 14   Ht 5\' 5"  (1.651 m)   Wt 103 lb (46.7 kg)   LMP 09/03/2000 (Approximate)   BMI 17.14 kg/m    Physical Exam Vitals and nursing note reviewed.  Constitutional:      Appearance: She is well-developed.  HENT:     Head: Normocephalic and atraumatic.  Eyes:     Conjunctiva/sclera: Conjunctivae normal.  Cardiovascular:     Rate and Rhythm: Normal rate and regular rhythm.     Heart sounds: Normal heart sounds.  Pulmonary:     Effort: Pulmonary effort is normal.     Breath sounds: Normal breath sounds.  Abdominal:     General: Bowel sounds are normal.     Palpations: Abdomen is soft.  Musculoskeletal:     Cervical back: Normal range of motion.  Lymphadenopathy:     Cervical: No cervical adenopathy.  Skin:    General: Skin is warm and dry.     Capillary Refill: Capillary refill takes less than 2 seconds.  Neurological:     Mental Status: She is alert and oriented to person, place, and time.  Psychiatric:        Behavior: Behavior  normal.      Musculoskeletal Exam: Cervical, thoracic and lumbar spine 1 good range of motion.  Shoulders, elbows, wrist joints, MCPs PIPs and DIPs with good range of motion.  She had bilateral CMC PIP and DIP thickening with no synovitis.  Hips and knee joints in good range of motion.  There was no tenderness over ankles or MTPs.  She had bilateral trapezius spasm.  CDAI Exam: CDAI Score: -- Patient Global: --; Provider Global: -- Swollen: --; Tender: -- Joint Exam 07/03/2023   No joint exam has been documented for this visit   There is currently no information documented on the homunculus. Go to the Rheumatology activity and complete the homunculus joint exam.  Investigation: No additional findings.  Imaging: No results found.  Recent Labs: Lab Results  Component Value Date   WBC 5.0 01/01/2022   HGB 14.0 01/01/2022   PLT 204 01/01/2022   NA 140 02/12/2023   K 3.8 02/12/2023   CL 107 02/12/2023   CO2 27 02/12/2023   GLUCOSE 98 02/12/2023   BUN 10 02/12/2023   CREATININE 0.58 02/12/2023   BILITOT 0.3 02/12/2023   ALKPHOS 73 02/12/2023   AST 19 02/12/2023   ALT 8 02/12/2023   PROT 7.2 02/12/2023   ALBUMIN 4.4 02/12/2023   CALCIUM 9.5 02/12/2023   GFRAA >60 10/22/2018    Speciality Comments: Prolia started 2019.  Last injection July 31, 2021.  Patient wants to switch Prolia injections to our office.  Procedures:  No procedures performed Allergies: Divalproex sodium, Nsaids, and Zoloft [sertraline]   Assessment / Plan:     Visit Diagnoses: Primary osteoarthritis of both hands -she continues to have pain and stiffness in her hands.  No synovitis was noted.  X-rays of both hands were obtained on 02/09/2020 which were consistent with osteoarthritic changes.  No erosive changes noted.  Joint protection muscle strengthening was discussed.  Chronic pain of left knee -doing better.  She was evaluated by Dr. Debby Bud and was diagnosed with meniscal tear.  She underwent  meniscal tear repair in February 2024.  Trapezius muscle spasm-she has been going to physical therapy  and has noticed some relief.  She is planning to get dry needling.  DDD (degenerative disc disease), cervical - MRI performed on 05/03/20.  She establish care with Dr. Alvester Morin in September 2021.  She had facet joint injections on 06/02/2020, 03/30/2021, and 01/31/2022.  Pain in thoracic spine -she has intermittent discomfort.  Thoracic spine pain resolved after T9-T10 epidural injection done by Dr. Alvester Morin  Spondylosis of lumbar spine-she has off-and-on discomfort in her lower back.  Sicca syndrome (HCC) -patient complains of increased symptoms of dry mouth and dry eyes.  She is also noticed dry skin.  I will obtain labs today.  Plan: CBC with Differential/Platelet, Rheumatoid factor, ANA, Sjogrens syndrome-A extractable nuclear antibody, Sjogrens syndrome-B extractable nuclear antibody, Anti-DNA antibody, double-stranded, C3 and C4, Anti-Smith antibody, RNP Antibody, Anti-scleroderma antibody.  Will contact her once the lab results are available.  Fibromyalgia-she continues to have generalized pain and discomfort from fibromyalgia.  Need for regular exercise and stretching was discussed.  She has been going to physical therapy which has been helpful.  Primary insomnia -she takes one fourth of Ambien 10 mg at bedtime.  TMJ (temporomandibular joint syndrome)  Age-related osteoporosis without current pathological fracture - DEXA updated on 10/22/20: The BMD measured at Femur Neck Left is 0.560 g/cm2 with a T-score of -3.4. Prolia since 2019 initially prescribed by Dr. Talmage Nap.  Her last Prolia injection was in June 2024.  Her DEXA scan is scheduled in November.  She has been taking calcium and vitamin D.  History of gastroesophageal reflux (GERD)  Interstitial cystitis  History of IBS - followed by gastroenterology.  Anxiety and depression-recent diagnosis.  Patient has been taking calm aid and  L-theanine which has been helpful.  She is deciding if she will go on Lexapro.  She has a prescription.  Orders: Orders Placed This Encounter  Procedures   CBC with Differential/Platelet   Rheumatoid factor   ANA   Sjogrens syndrome-A extractable nuclear antibody   Sjogrens syndrome-B extractable nuclear antibody   Anti-DNA antibody, double-stranded   C3 and C4   Anti-Smith antibody   RNP Antibody   Anti-scleroderma antibody   No orders of the defined types were placed in this encounter.    Follow-Up Instructions: Return in about 6 months (around 01/01/2024) for Osteoarthritis, Osteoporosis.   Pollyann Savoy, MD  Note - This record has been created using Animal nutritionist.  Chart creation errors have been sought, but may not always  have been located. Such creation errors do not reflect on  the standard of medical care.

## 2023-07-03 ENCOUNTER — Encounter: Payer: Self-pay | Admitting: Rheumatology

## 2023-07-03 ENCOUNTER — Ambulatory Visit: Payer: 59 | Attending: Rheumatology | Admitting: Rheumatology

## 2023-07-03 VITALS — BP 96/68 | HR 86 | Resp 14 | Ht 65.0 in | Wt 103.0 lb

## 2023-07-03 DIAGNOSIS — M25562 Pain in left knee: Secondary | ICD-10-CM | POA: Diagnosis not present

## 2023-07-03 DIAGNOSIS — F419 Anxiety disorder, unspecified: Secondary | ICD-10-CM

## 2023-07-03 DIAGNOSIS — F32A Depression, unspecified: Secondary | ICD-10-CM

## 2023-07-03 DIAGNOSIS — N3010 Interstitial cystitis (chronic) without hematuria: Secondary | ICD-10-CM

## 2023-07-03 DIAGNOSIS — M19042 Primary osteoarthritis, left hand: Secondary | ICD-10-CM

## 2023-07-03 DIAGNOSIS — M19041 Primary osteoarthritis, right hand: Secondary | ICD-10-CM

## 2023-07-03 DIAGNOSIS — M810 Age-related osteoporosis without current pathological fracture: Secondary | ICD-10-CM

## 2023-07-03 DIAGNOSIS — M62838 Other muscle spasm: Secondary | ICD-10-CM

## 2023-07-03 DIAGNOSIS — F5101 Primary insomnia: Secondary | ICD-10-CM

## 2023-07-03 DIAGNOSIS — M47816 Spondylosis without myelopathy or radiculopathy, lumbar region: Secondary | ICD-10-CM

## 2023-07-03 DIAGNOSIS — M546 Pain in thoracic spine: Secondary | ICD-10-CM

## 2023-07-03 DIAGNOSIS — M26609 Unspecified temporomandibular joint disorder, unspecified side: Secondary | ICD-10-CM

## 2023-07-03 DIAGNOSIS — M5030 Other cervical disc degeneration, unspecified cervical region: Secondary | ICD-10-CM | POA: Diagnosis not present

## 2023-07-03 DIAGNOSIS — M3500 Sicca syndrome, unspecified: Secondary | ICD-10-CM

## 2023-07-03 DIAGNOSIS — G8929 Other chronic pain: Secondary | ICD-10-CM

## 2023-07-03 DIAGNOSIS — M797 Fibromyalgia: Secondary | ICD-10-CM

## 2023-07-03 DIAGNOSIS — Z8719 Personal history of other diseases of the digestive system: Secondary | ICD-10-CM

## 2023-07-04 LAB — SJOGRENS SYNDROME-A EXTRACTABLE NUCLEAR ANTIBODY: SSA (Ro) (ENA) Antibody, IgG: <1 AI

## 2023-07-04 LAB — CBC WITH DIFFERENTIAL/PLATELET
Absolute Lymphocytes: 1308 {cells}/uL (ref 850–3900)
Absolute Monocytes: 648 {cells}/uL (ref 200–950)
Basophils Absolute: 30 {cells}/uL (ref 0–200)
Basophils Relative: 0.5 %
Eosinophils Absolute: 138 {cells}/uL (ref 15–500)
Eosinophils Relative: 2.3 %
HCT: 44.3 % (ref 35.0–45.0)
Hemoglobin: 14.5 g/dL (ref 11.7–15.5)
MCH: 32.5 pg (ref 27.0–33.0)
MCHC: 32.7 g/dL (ref 32.0–36.0)
MCV: 99.3 fL (ref 80.0–100.0)
MPV: 11.4 fL (ref 7.5–12.5)
Monocytes Relative: 10.8 %
Neutro Abs: 3876 {cells}/uL (ref 1500–7800)
Neutrophils Relative %: 64.6 %
Platelets: 197 10*3/uL (ref 140–400)
RBC: 4.46 10*6/uL (ref 3.80–5.10)
RDW: 12.9 % (ref 11.0–15.0)
Total Lymphocyte: 21.8 %
WBC: 6 10*3/uL (ref 3.8–10.8)

## 2023-07-04 LAB — SJOGRENS SYNDROME-B EXTRACTABLE NUCLEAR ANTIBODY: SSB (La) (ENA) Antibody, IgG: <1 AI

## 2023-07-04 LAB — RHEUMATOID FACTOR: Rheumatoid fact SerPl-aCnc: <10 [IU]/mL (ref <14)

## 2023-07-04 LAB — ANTI-SMITH ANTIBODY: ENA SM Ab Ser-aCnc: <1 AI

## 2023-07-04 LAB — ANA: Anti Nuclear Antibody (ANA): NEGATIVE

## 2023-07-04 LAB — C3 AND C4
C3 Complement: 134 mg/dL (ref 83–193)
C4 Complement: 21 mg/dL (ref 15–57)

## 2023-07-04 LAB — RNP ANTIBODY: Ribonucleic Protein(ENA) Antibody, IgG: <1 AI

## 2023-07-04 LAB — ANTI-SCLERODERMA ANTIBODY: Scleroderma (Scl-70) (ENA) Antibody, IgG: <1 AI

## 2023-07-04 LAB — ANTI-DNA ANTIBODY, DOUBLE-STRANDED: ds DNA Ab: <1 [IU]/mL

## 2023-07-05 NOTE — Progress Notes (Signed)
CBC normal, rheumatoid factor negative, ANA negative, Sjogren's antibodies negative, double-stranded DNA negative, Smith negative, RNP negative, C3-C4 normal, scleroderma antibody negative.  All autoimmune workup is negative.

## 2023-07-24 ENCOUNTER — Inpatient Hospital Stay
Admission: RE | Admit: 2023-07-24 | Discharge: 2023-07-24 | Disposition: A | Discharge status: Home / Self Care | Payer: 59 | Source: Ambulatory Visit | Attending: Obstetrics & Gynecology | Admitting: Obstetrics & Gynecology

## 2023-07-24 DIAGNOSIS — Z8739 Personal history of other diseases of the musculoskeletal system and connective tissue: Secondary | ICD-10-CM

## 2023-07-25 NOTE — Progress Notes (Signed)
Hi Dr. Hyacinth Meeker, Thank you for forwarding the DEXA scan results.  We plan to start on Evenity in combination with Prolia if approved by the insurance. Sue Lush, please schedule an earlier appointment to discuss stating on Evenity.

## 2023-07-26 ENCOUNTER — Other Ambulatory Visit: Payer: Self-pay | Admitting: Pulmonary Disease

## 2023-08-07 ENCOUNTER — Telehealth: Payer: Self-pay | Admitting: Pharmacist

## 2023-08-07 ENCOUNTER — Encounter: Payer: Self-pay | Admitting: Pharmacist

## 2023-08-07 DIAGNOSIS — Z5181 Encounter for therapeutic drug level monitoring: Secondary | ICD-10-CM

## 2023-08-07 DIAGNOSIS — M810 Age-related osteoporosis without current pathological fracture: Secondary | ICD-10-CM

## 2023-08-07 DIAGNOSIS — Z79899 Other long term (current) drug therapy: Secondary | ICD-10-CM

## 2023-08-07 NOTE — Telephone Encounter (Signed)
Patient due for Prolia on 08/12/23. Already scheduled for [employer] on 08/16/23  MyChart message sent to patient regarding labs.  Future order placed for CMET  Chesley Mires, PharmD, MPH, BCPS, CPP Clinical Pharmacist (Rheumatology and Pulmonology)

## 2023-08-09 ENCOUNTER — Encounter: Payer: Self-pay | Admitting: Rheumatology

## 2023-08-12 NOTE — Progress Notes (Unsigned)
Office Visit Note  Patient: Emily Ward             Date of Birth: 1955-11-26           MRN: [id number]             PCP: Nelwyn Salisbury, MD Referring: Nelwyn Salisbury, MD Visit Date: 08/26/2023 Occupation: @GUAROCC @  Subjective:  Discuss DEXA results   History of Present Illness: Emily Ward is a 67 y.o. female with history of osteoarthritis and DDD.  Patient had an updated bone density on 07/24/2023 and presented today to discuss results as well as treatment options.  She has been prescribed Prolia 60 mg sq injections every 6 months by Dr. Talmage Nap since 2019.  Her last Prolia injection was administered on 08/16/23.  She has tolerated Prolia without any side effects.  Patient states that she has started taking the prescription strength vitamin D 50,000 units once weekly as prescribed.   Patient states that she follows a vegan diet and has been taking a supplement with only a small amount of calcium vitamin D.  She is aware that she will need to increase her calcium intake. She presents today to discuss treatment options since she has not had any improvement with Prolia.    Activities of Daily Living:  Patient reports morning stiffness for 0  none .   Patient Reports nocturnal pain.  Difficulty dressing/grooming: Denies Difficulty climbing stairs: Denies Difficulty getting out of chair: Denies Difficulty using hands for taps, buttons, cutlery, and/or writing: Reports  Review of Systems  Constitutional:  Negative for fatigue.  HENT:  Positive for mouth dryness. Negative for mouth sores.   Eyes:  Positive for dryness.  Respiratory:  Positive for shortness of breath.   Cardiovascular:  Negative for chest pain and palpitations.  Gastrointestinal:  Negative for blood in stool, constipation and diarrhea.  Endocrine: Negative for increased urination.  Genitourinary:  Positive for involuntary urination.  Musculoskeletal:  Positive for joint pain, joint pain, myalgias,  muscle weakness, muscle tenderness and myalgias. Negative for gait problem, joint swelling and morning stiffness.  Skin:  Positive for hair loss. Negative for color change, rash and sensitivity to sunlight.  Allergic/Immunologic: Negative for susceptible to infections.  Neurological:  Positive for headaches. Negative for dizziness.  Hematological:  Negative for swollen glands.  Psychiatric/Behavioral:  Positive for depressed mood and sleep disturbance. The patient is not nervous/anxious.     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   Palpitations 07/30/2017   Shortness of breath 07/30/2017   Hormone replacement therapy (HRT) 03/30/2017   Former smoker 03/30/2017   Other fatigue 06/30/2016   Insomnia 06/30/2016   DJD (degenerative joint disease), cervical 06/30/2016   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/2016   IBS (irritable bowel syndrome) 06/30/2016   Interstitial cystitis 06/30/2016   SHOULDER PAIN, RIGHT 09/05/2010   GERD 03/24/2009   SACROILIAC STRAIN 03/24/2009   Atypical chest pain 01/20/2008   HEMATURIA UNSPECIFIED 10/14/2007   FIBROMYALGIA 07/03/2007   HYPERLIPIDEMIA NEC/NOS 05/16/2007   HEADACHE 05/09/2007    Past Medical History:  Diagnosis Date   Anxiety    Cervical disc disease 2006   COPD (chronic obstructive pulmonary disease) (HCC)    Depression  Emphysema lung (HCC)    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     Fibromyalgia    sees Dr. 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 brother   Colon cancer Maternal Grandmother    Arthritis Son        psoriatic   Cancer Other        breast/fhx   Heart disease Other        fhx   Esophageal cancer Neg Hx    Rectal cancer Neg Hx    Stomach cancer Neg Hx    Colon polyps Neg Hx    Past Surgical History:  Procedure Laterality Date   BREAST BIOPSY Right 01/24/2023   MM RT BREAST BX W LOC DEV 1ST LESION IMAGE BX SPEC STEREO GUIDE 01/24/2023 [facility]   BREAST BIOPSY Right 01/24/2023   MM RT BREAST BX W LOC DEV EA AD LESION IMG BX SPEC STEREO GUIDE 01/24/2023 [facility]   CERVICAL LAMINECTOMY  2006   CESAREAN SECTION  1993   CHOLECYSTECTOMY  06/2002   COLONOSCOPY  07/19/2022   per Dr. Norwood Levo, adenmatous polyp, repeat in 6 months   CYSTOSCOPY N/A 09/01/2018   Procedure: CYSTOSCOPY;  Surgeon: Jerene Bears, MD;  Location: [facility];  Service: Gynecology;  Laterality: N/A;   DILATATION & CURETTAGE/HYSTEROSCOPY WITH MYOSURE N/A 04/12/2017   Procedure: DILATATION & CURETTAGE/HYSTEROSCOPY;  Surgeon: Jerene Bears, MD;  Location: [facility];  Service: Gynecology;  Laterality: N/A;   epidural thoracic spine injection  06/07/2022   facet joint block  03/30/2021   per patient, performed by Dr. Alvester Morin   facet joint block  01/31/2022   per patient, performed by Dr. Alvester Morin   HYSTEROSCOPY     years ago with Dr Arelia Sneddon-? 2000   KNEE ARTHROSCOPY Left 10/24/2022   TOTAL LAPAROSCOPIC HYSTERECTOMY WITH SALPINGECTOMY Bilateral 09/01/2018   Procedure: TOTAL LAPAROSCOPIC HYSTERECTOMY WITH SALPINGECTOMY  BSO, REPAIR OF PERINEAL LACERATION;  Surgeon: Jerene Bears, MD;  Location: [facility];  Service: Gynecology;  Laterality: Bilateral;  possible BSO    URETHRAL DILATION     Social History   Social History Narrative   Not on file   Immunization History  Administered Date(s) Administered   Influenza Whole 06/30/2007, 05/04/2010   Influenza,inj,Quad PF,6+ Mos 07/12/2017, 05/23/2018, 09/08/2021   Influenza-Unspecified 06/02/2014, 06/03/2016, 06/20/2020   MMR 06/30/2007   Moderna Sars-Covid-2 Vaccination 09/07/2019, 10/12/2019, 07/13/2020, 02/21/2021   PNEUMOCOCCAL CONJUGATE-20 10/03/2022   Pneumococcal Polysaccharide-23 03/23/2019   Td 09/03/1998, 05/05/2009   Tdap 10/22/2018     Objective: Vital Signs: BP 108/71 (BP Location: Left Arm, Patient Position: Sitting, Cuff Size: Normal)   Pulse 72   Ht 5\' 5"  (1.651 m)   Wt 105 lb (47.6 kg)   LMP 09/03/2000 (Approximate)   BMI 17.47 kg/m    Physical Exam Vitals and nursing  note reviewed.  Constitutional:      Appearance: She is well-developed.  HENT:     Head: Normocephalic and atraumatic.  Eyes:     Conjunctiva/sclera: Conjunctivae normal.  Cardiovascular:     Rate and Rhythm: Normal rate and regular rhythm.     Heart sounds: Normal heart sounds.  Pulmonary:     Effort: Pulmonary effort is normal.     Breath sounds: Normal breath sounds.  Abdominal:     General: Bowel sounds are normal.     Palpations: Abdomen is soft.  Musculoskeletal:     Cervical back: Normal range of motion.  Lymphadenopathy:     Cervical: No cervical adenopathy.  Skin:    General: Skin is warm and dry.     Capillary Refill: Capillary refill takes less than 2 seconds.  Neurological:     Mental Status: She is alert and oriented to person, place, and time.  Psychiatric:        Behavior: Behavior normal.      Musculoskeletal Exam: C-spine, thoracic spine, lumbar spine good range of motion.  Trapezius muscle tension and tenderness bilaterally.  Shoulder joints, elbow joints, wrist joints, MCPs, PIPs, DIPs have good range of motion with no synovitis.  CMC, PIP, DIP thickening consistent with  osteoarthritis of both hands.  No synovitis noted.  Hip joints have good range of motion with no groin pain.  Knee joints have good range of motion no warmth or effusion.  Ankle joints have good range of motion with no tenderness or joint swelling.  CDAI Exam: CDAI Score: -- Patient Global: --; Provider Global: -- Swollen: --; Tender: -- Joint Exam 08/26/2023   No joint exam has been documented for this visit   There is currently no information documented on the homunculus. Go to the Rheumatology activity and complete the homunculus joint exam.  Investigation: No additional findings.  Imaging: No results found.  Recent Labs: Lab Results  Component Value Date   WBC 6.0 07/03/2023   HGB 14.5 07/03/2023   PLT 197 07/03/2023   NA 139 08/14/2023   K 4.6 08/14/2023   CL 104 08/14/2023   CO2 28 08/14/2023   GLUCOSE 90 08/14/2023   BUN 19 08/14/2023   CREATININE 0.64 08/14/2023   BILITOT 0.3 08/14/2023   ALKPHOS 73 02/12/2023   AST 20 08/14/2023   ALT 9 08/14/2023   PROT 6.3 08/14/2023   ALBUMIN 4.4 02/12/2023   CALCIUM 9.4 08/14/2023   GFRAA >60 10/22/2018    Speciality Comments: Prolia started 2019.  Last injection July 31, 2021.  Patient wants to switch Prolia injections to our office.  Procedures:  No procedures performed Allergies: Divalproex sodium, Nsaids, and Zoloft [sertraline]   Assessment / Plan:     Visit Diagnoses: Age-related osteoporosis without current pathological fracture:  DEXA updated on 10/22/20: The BMD measured at Femur Neck Left is 0.560 g/cm2 with a T-score of -3.4. Prolia since 2019 initially prescribed by Dr. Talmage Nap.  She has been prescribed Prolia 60 mg sq injections every 6 months--most recent injection was administered on 08/16/23.   DEXA updated on 07/24/23:  The BMD measured at Femur Total Left is 0.591 g/cm2 with a T-score of -3.3.  Vitamin D was low at 28 on 08/14/23--vitamin D 50,000 units once weekly was sent to the pharmacy which she  will take x 3 months.  Plan to recheck vitamin D in 3 months. DEXA results were reviewed with the patient today in the office.  Different treatment options  were discussed today in detail. Plan to proceed with Evenity  if approved by insurance.  Plan to start the approval process for Evenity monthly injections x 12 months.  Indications, contraindications, potential side effects of Evenity were discussed today in detail.  Consent form was completed.  Discussed that once completing the year of Evenity she will likely resume Prolia injections every 6 months.  She does not want to remain on Prolia as combination therapy while on Evenity. Next DEXA due November 2026.    Medication monitoring encounter: Plan to apply for Evenity monthly injections through insurance.   Primary osteoarthritis of both hands: CMC joint, PIP, and DIP thickening consistent with osteoarthritis of both hands.  No synovitis noted.   Chronic pain of left knee - Under are of Dr. Ranell Patrick and was diagnosed with meniscal tear.  She underwent meniscal tear repair in February 2024. No warmth or effusion noted.   Trapezius muscle spasm: Currently going to PT weekly.    DDD (degenerative disc disease), cervical - MRI performed on 05/03/20.  She establish care with Dr. Alvester Morin in September 2021.  She had facet joint injections on 06/02/2020, 03/30/2021, and 01/31/2022.  Patient has been going to physical therapy which she finds to be helpful.  Pain in thoracic spine - Thoracic spine pain resolved after T9-T10 epidural injection done by Dr. Alvester Morin.  Patient has been going to physical therapy which she has found to be helpful at alleviating her discomfort.  Spondylosis of lumbar spine: She has intermittent discomfort in her lower back.  She is planning on resuming yoga.  Sicca syndrome (HCC): Chronic sicca symptoms.  Lab work from 07/03/2023 was reviewed today in the office: ANA negative, RF negative, Ro antibody negative, line body negative,  double-stranded negative, complements within normal limits, Smith antibody negative, RNP antibody negative, SCL 70 antibody negative.  Fibromyalgia: Patient continues to experience intermittent myalgias and muscle tenderness due to fibromyalgia.  She continues to take Flexeril 10 mg 3 times daily as needed for muscle spasms.  Primary insomnia: She takes Ambien 2.5 mg at bedtime for insomnia.   Other medical conditions are listed as follows:   TMJ (temporomandibular joint syndrome)  History of gastroesophageal reflux (GERD)  Interstitial cystitis  History of IBS  Anxiety and depression  Vitamin D deficiency  Orders: No orders of the defined types were placed in this encounter.  No orders of the defined types were placed in this encounter.  Follow-Up Instructions: Return in about 6 months (around 02/24/2024) for Osteoporosis, DDD.   Gearldine Bienenstock, PA-C  Note - This record has been created using Dragon software.  Chart creation errors have been sought, but may not always  have been located. Such creation errors do not reflect on  the standard of medical care.

## 2023-08-14 ENCOUNTER — Encounter: Payer: Self-pay | Admitting: Rheumatology

## 2023-08-14 ENCOUNTER — Other Ambulatory Visit: Payer: Self-pay | Admitting: *Deleted

## 2023-08-14 DIAGNOSIS — Z79899 Other long term (current) drug therapy: Secondary | ICD-10-CM

## 2023-08-14 DIAGNOSIS — E559 Vitamin D deficiency, unspecified: Secondary | ICD-10-CM

## 2023-08-14 DIAGNOSIS — Z5181 Encounter for therapeutic drug level monitoring: Secondary | ICD-10-CM

## 2023-08-14 DIAGNOSIS — M810 Age-related osteoporosis without current pathological fracture: Secondary | ICD-10-CM

## 2023-08-15 ENCOUNTER — Other Ambulatory Visit: Payer: Self-pay

## 2023-08-15 ENCOUNTER — Other Ambulatory Visit: Payer: Self-pay | Admitting: *Deleted

## 2023-08-15 ENCOUNTER — Telehealth: Payer: Self-pay | Admitting: Pharmacy Technician

## 2023-08-15 DIAGNOSIS — E559 Vitamin D deficiency, unspecified: Secondary | ICD-10-CM

## 2023-08-15 LAB — COMPREHENSIVE METABOLIC PANEL
AG Ratio: 1.9 (calc) (ref 1.0–2.5)
ALT: 9 U/L (ref 6–29)
AST: 20 U/L (ref 10–35)
Albumin: 4.1 g/dL (ref 3.6–5.1)
Alkaline phosphatase (APISO): 76 U/L (ref 37–153)
BUN: 19 mg/dL (ref 7–25)
CO2: 28 mmol/L (ref 20–32)
Calcium: 9.4 mg/dL (ref 8.6–10.4)
Chloride: 104 mmol/L (ref 98–110)
Creat: 0.64 mg/dL (ref 0.50–1.05)
Globulin: 2.2 g/dL (ref 1.9–3.7)
Glucose, Bld: 90 mg/dL (ref 65–99)
Potassium: 4.6 mmol/L (ref 3.5–5.3)
Sodium: 139 mmol/L (ref 135–146)
Total Bilirubin: 0.3 mg/dL (ref 0.2–1.2)
Total Protein: 6.3 g/dL (ref 6.1–8.1)

## 2023-08-15 LAB — VITAMIN D 25 HYDROXY (VIT D DEFICIENCY, FRACTURES): Vit D, 25-Hydroxy: 28 ng/mL — ABNORMAL LOW (ref 30–100)

## 2023-08-15 MED ORDER — VITAMIN D (ERGOCALCIFEROL) 1.25 MG (50000 UNIT) PO CAPS: 50000.0000 [IU] | ORAL_CAPSULE | ORAL | 0 refills | Status: DC

## 2023-08-15 NOTE — Progress Notes (Signed)
CMP is normal.  Vitamin D is low at 28.  Patient should take vitamin D 50,000 units once a week.  Repeat vitamin D in 3 months.  After finishing the course of vitamin D she should stay on vitamin D 2000 units daily.

## 2023-08-15 NOTE — Telephone Encounter (Signed)
ERROR

## 2023-08-15 NOTE — Telephone Encounter (Signed)
-----   Message from [facility] sent at 08/15/2023  8:03 AM EST ----- CMP is normal.  Vitamin D is low at 28.  Patient should take vitamin D 50,000 units once a week.  Repeat vitamin D in 3 months.  After finishing the course of vitamin D she should stay on vitamin D 2000 units daily.

## 2023-08-15 NOTE — Telephone Encounter (Addendum)
Auth Submission: APPROVED Site of care: Site of care: [facility] Payer: [facility] MEDICARE Medication & CPT/J Code(s) submitted: Prolia (Denosumab) [id number] Route of submission (phone, fax, portal): PORTAL Phone # Fax # Auth type: Buy/Bill PB Units/visits requested: X2 Reference number: [id number] Approval from: 08/15/23 to 08/14/24   New id number: [id number]

## 2023-08-16 ENCOUNTER — Encounter: Payer: Self-pay | Admitting: Rheumatology

## 2023-08-16 ENCOUNTER — Ambulatory Visit: Payer: Medicare Other | Admitting: *Deleted

## 2023-08-16 VITALS — BP 116/77 | HR 88 | Temp 98.0°F | Resp 16 | Ht 65.0 in | Wt 105.8 lb

## 2023-08-16 DIAGNOSIS — M810 Age-related osteoporosis without current pathological fracture: Secondary | ICD-10-CM | POA: Diagnosis not present

## 2023-08-16 MED ORDER — DENOSUMAB 60 MG/ML ~~LOC~~ SOSY
60.0000 mg | PREFILLED_SYRINGE | Freq: Once | SUBCUTANEOUS | Status: AC
  Administered 2023-08-16: 60 mg via SUBCUTANEOUS

## 2023-08-16 NOTE — Progress Notes (Signed)
Diagnosis: Osteoporosis  Provider:  Chilton Greathouse MD  Procedure: Injection  Prolia (Denosumab), Dose: 60 mg, Site: subcutaneous, Number of injections: 1  Injection Site(s): Right arm  Post Care: Observation period completed  Discharge: Condition: Good, Destination: Home . AVS Declined  Performed by:  Forrest Moron, RN

## 2023-08-26 ENCOUNTER — Ambulatory Visit: Payer: Medicare Other | Attending: Physician Assistant | Admitting: Physician Assistant

## 2023-08-26 ENCOUNTER — Encounter: Payer: Self-pay | Admitting: Physician Assistant

## 2023-08-26 ENCOUNTER — Other Ambulatory Visit: Payer: Self-pay | Admitting: Pharmacist

## 2023-08-26 VITALS — BP 108/71 | HR 72 | Ht 65.0 in | Wt 105.0 lb

## 2023-08-26 DIAGNOSIS — M26609 Unspecified temporomandibular joint disorder, unspecified side: Secondary | ICD-10-CM

## 2023-08-26 DIAGNOSIS — M3500 Sicca syndrome, unspecified: Secondary | ICD-10-CM

## 2023-08-26 DIAGNOSIS — M797 Fibromyalgia: Secondary | ICD-10-CM

## 2023-08-26 DIAGNOSIS — M810 Age-related osteoporosis without current pathological fracture: Secondary | ICD-10-CM

## 2023-08-26 DIAGNOSIS — M47816 Spondylosis without myelopathy or radiculopathy, lumbar region: Secondary | ICD-10-CM

## 2023-08-26 DIAGNOSIS — E559 Vitamin D deficiency, unspecified: Secondary | ICD-10-CM

## 2023-08-26 DIAGNOSIS — Z5181 Encounter for therapeutic drug level monitoring: Secondary | ICD-10-CM

## 2023-08-26 DIAGNOSIS — M25562 Pain in left knee: Secondary | ICD-10-CM

## 2023-08-26 DIAGNOSIS — G8929 Other chronic pain: Secondary | ICD-10-CM

## 2023-08-26 DIAGNOSIS — M5030 Other cervical disc degeneration, unspecified cervical region: Secondary | ICD-10-CM

## 2023-08-26 DIAGNOSIS — F5101 Primary insomnia: Secondary | ICD-10-CM

## 2023-08-26 DIAGNOSIS — Z8719 Personal history of other diseases of the digestive system: Secondary | ICD-10-CM

## 2023-08-26 DIAGNOSIS — M19042 Primary osteoarthritis, left hand: Secondary | ICD-10-CM

## 2023-08-26 DIAGNOSIS — M546 Pain in thoracic spine: Secondary | ICD-10-CM

## 2023-08-26 DIAGNOSIS — F32A Depression, unspecified: Secondary | ICD-10-CM

## 2023-08-26 DIAGNOSIS — M19041 Primary osteoarthritis, right hand: Secondary | ICD-10-CM | POA: Diagnosis not present

## 2023-08-26 DIAGNOSIS — N3010 Interstitial cystitis (chronic) without hematuria: Secondary | ICD-10-CM

## 2023-08-26 DIAGNOSIS — M62838 Other muscle spasm: Secondary | ICD-10-CM

## 2023-08-26 DIAGNOSIS — F419 Anxiety disorder, unspecified: Secondary | ICD-10-CM

## 2023-08-26 NOTE — Progress Notes (Signed)
Pharmacy Note  Subjective:  Patient presents today to [facility] for follow up office visit.   Patient was seen by the pharmacist for counseling on Evenity.   History of cardiovascular events in the past 12 months? no  Objective: CMP     Component Value Date/Time   NA 139 08/14/2023 1535   NA 140 09/08/2018 1353   K 4.6 08/14/2023 1535   CL 104 08/14/2023 1535   CO2 28 08/14/2023 1535   GLUCOSE 90 08/14/2023 1535   BUN 19 08/14/2023 1535   BUN 17 09/08/2018 1353   CREATININE 0.64 08/14/2023 1535   CALCIUM 9.4 08/14/2023 1535   PROT 6.3 08/14/2023 1535   PROT 6.5 09/08/2018 1353   ALBUMIN 4.4 02/12/2023 1235   ALBUMIN 4.3 09/08/2018 1353   AST 20 08/14/2023 1535   AST 21 01/21/2018 1308   ALT 9 08/14/2023 1535   ALT 10 01/21/2018 1308   ALKPHOS 73 02/12/2023 1235   BILITOT 0.3 08/14/2023 1535   BILITOT 0.4 09/08/2018 1353   BILITOT 0.3 01/21/2018 1308   GFRNONAA >60 10/22/2018 0804   GFRNONAA >60 01/21/2018 1308   GFRNONAA 104 01/03/2018 1227   GFRAA >60 10/22/2018 0804   GFRAA >60 01/21/2018 1308   GFRAA 120 01/03/2018 1227    Vitamin D Lab Results  Component Value Date   VD25OH 28 (L) 08/14/2023   DEXA scan: DEXA updated on 10/22/20: The BMD measured at Femur Neck Left is 0.560 g/cm2 with a T-score of -3.4. Prolia since 2019 initially prescribed by Dr. Talmage Nap.  She has been prescribed Prolia 60 mg sq injections every 6 months--most recent injection was administered on 08/16/23.   DEXA updated on 07/24/23:  The BMD measured at Femur Total Left is 0.591 g/cm2 with a T-score of -3.3.   Assessment/Plan:  Counseled patient on purpose, proper use, and adverse effects of Evenity.  Counseled patient that Sheilah Mins is a medication that must be injected once monthly for 12 months by a healthcare professional.  Advised patient to take calcium 1200 mg daily and vitamin D 800 units daily.  Reviewed the most common adverse effects of Evenity including headache,  osteonecrosis of the jaw, and muscle/bone pain.  Reviewed that Sheilah Mins has FDA boxed warning for major adverse cardiovascular events. Reviewed that Evenity may increase the risk of myocardial infarction, stroke, and cardiovascular death. Confirmed that patient has not had MI or stroke within the preceding year.  Patient confirms she does not have any major dental work planned at this time.  Reviewed with patient the signs/symptoms of low calcium and advised patient to alert [place] if she experiences these symptoms.  Provided patient with medication education material and answered all questions. Will apply for Evenity through patient's insurance.   She just changed to Medicare. Addressed possible cost issues as a specialty medication  Chesley Mires, PharmD, MPH, BCPS, CPP Clinical Pharmacist (Rheumatology and Pulmonology)

## 2023-08-26 NOTE — Progress Notes (Signed)
Therapy plan placed for Evenity SQ [phone]) for [facility] to start benefits investigation. She is switching from Prolia. She also just switched to Medicare  Diagnosis: age-related osteoporosis  Provider: Dr. Pollyann Savoy and Sherron Ales, PA-C  Dose: 210mg  SQ every month x 12 months  Last Clinic Visit: 08/26/23 Next Clinic Visit: 12/31/23  Chesley Mires, PharmD, MPH, BCPS, CPP Clinical Pharmacist (Rheumatology and Pulmonology)

## 2023-08-29 ENCOUNTER — Telehealth: Payer: Self-pay | Admitting: Pharmacy Technician

## 2023-08-29 NOTE — Telephone Encounter (Signed)
Emily Ward note:  Auth Submission: APPROVED Site of care: Site of care: [facility] Payer: [employer] MEDICARE Medication & CPT/J Code(s) submitted: Evenity (Romosozumab) [id number] Route of submission (phone, fax, portal):  Phone # Fax # Auth type: Buy/Bill PB Units/visits requested: 12 DOSES Reference number: [id number] Approval from: 08/26/23 to 08/25/24

## 2023-09-18 ENCOUNTER — Ambulatory Visit: Payer: Medicare Other | Admitting: Physical Therapy

## 2023-09-22 ENCOUNTER — Other Ambulatory Visit: Payer: Self-pay | Admitting: Rheumatology

## 2023-09-22 ENCOUNTER — Other Ambulatory Visit: Payer: Self-pay | Admitting: Family Medicine

## 2023-09-22 DIAGNOSIS — E559 Vitamin D deficiency, unspecified: Secondary | ICD-10-CM

## 2023-10-02 ENCOUNTER — Encounter: Payer: 59 | Admitting: Physical Therapy

## 2023-10-16 ENCOUNTER — Encounter: Payer: 59 | Admitting: Physical Therapy

## 2023-10-20 ENCOUNTER — Other Ambulatory Visit: Payer: Self-pay | Admitting: Rheumatology

## 2023-10-20 DIAGNOSIS — E559 Vitamin D deficiency, unspecified: Secondary | ICD-10-CM

## 2023-10-21 NOTE — Telephone Encounter (Signed)
Her most recent Prolia injection was administered on 08/16/2023--may be too soon for her to have new Prolia orders placed.    Vitamin D was low at 28 on 08/14/2023.  Dr. Corliss Skains had recommended for the patient to take vitamin D 50,000 units once weekly x 3 months.  Vitamin D should be rechecked in mid March 2025. Has she been taking the prescription for vitamin D?  Calcium intake should be 1,000-1,200 mg daily.

## 2023-10-24 ENCOUNTER — Other Ambulatory Visit: Payer: Self-pay | Admitting: Family Medicine

## 2023-10-30 ENCOUNTER — Encounter: Payer: 59 | Admitting: Physical Therapy

## 2023-11-02 IMAGING — MG MM DIGITAL DIAGNOSTIC UNILAT*R* W/ TOMO W/ CAD
6 of 9 series · 6 of 21 positions shown · non-contrast
Comparison: Previous exam(s).

CLINICAL DATA: Screening recall for possible right breast asymmetry
and right breast calcifications.

EXAM:
DIGITAL DIAGNOSTIC UNILATERAL RIGHT MAMMOGRAM WITH TOMOSYNTHESIS AND
CAD
TECHNIQUE: Right digital diagnostic mammography and breast tomosynthesis was
performed. The images were evaluated with computer-aided detection.

[R ML (1 of 2)]
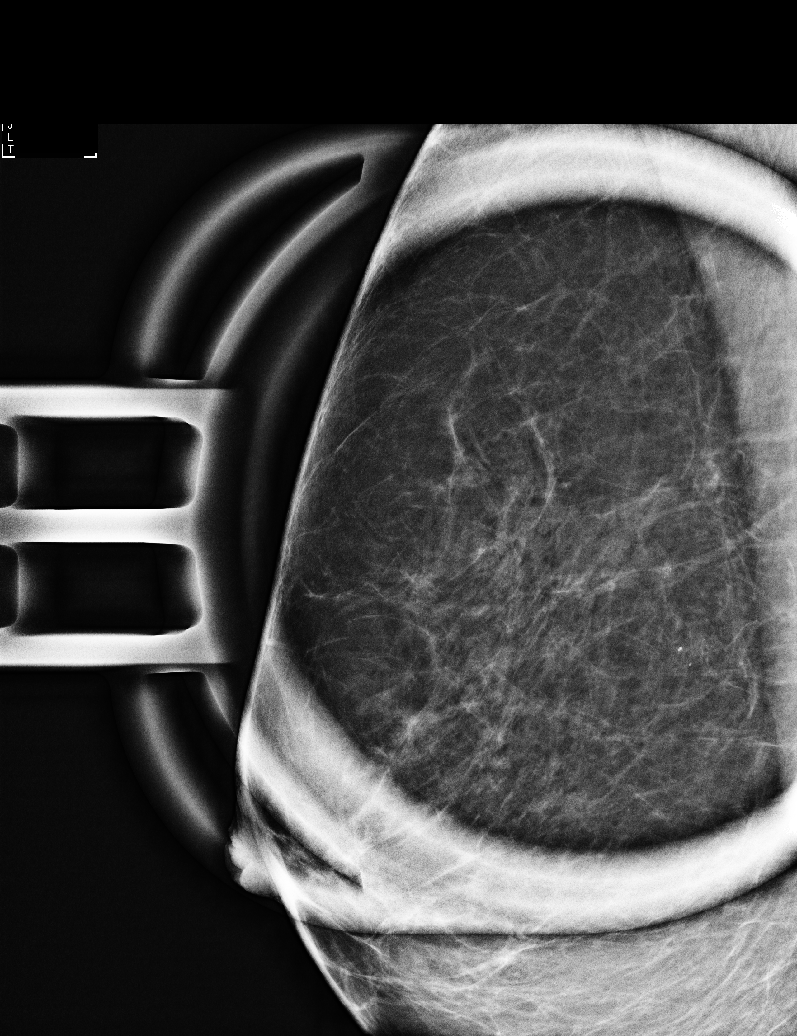

[R ML (2 of 2)]
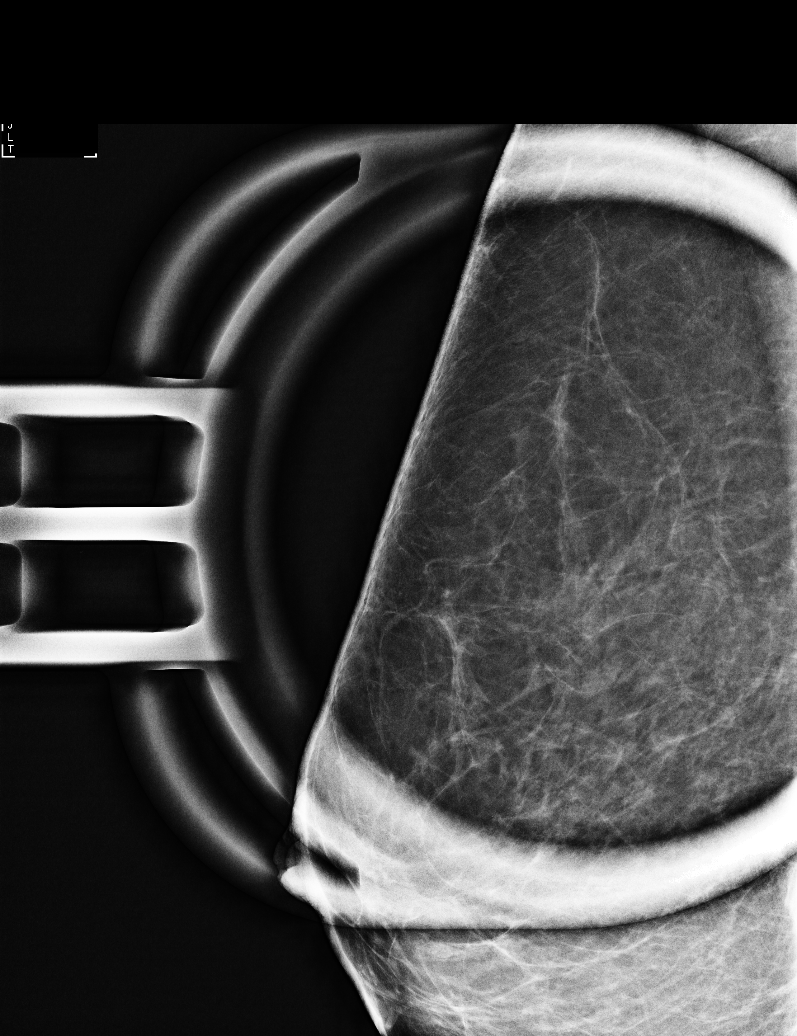

[R CC]
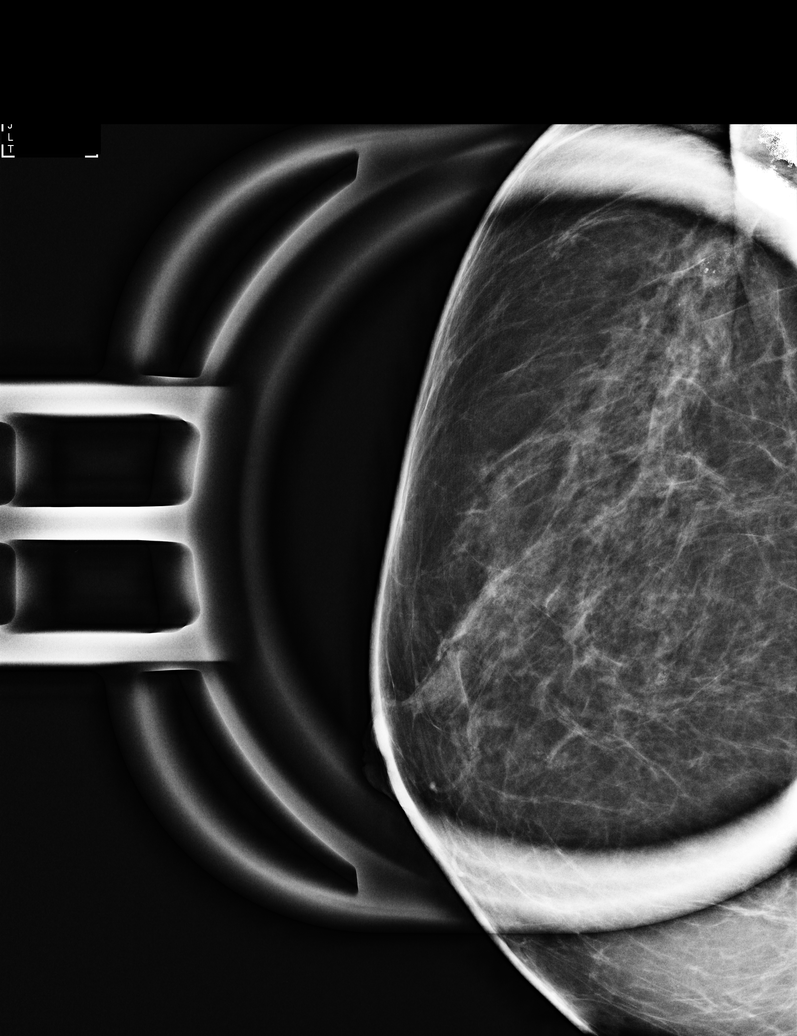

[R CC synth-2D]
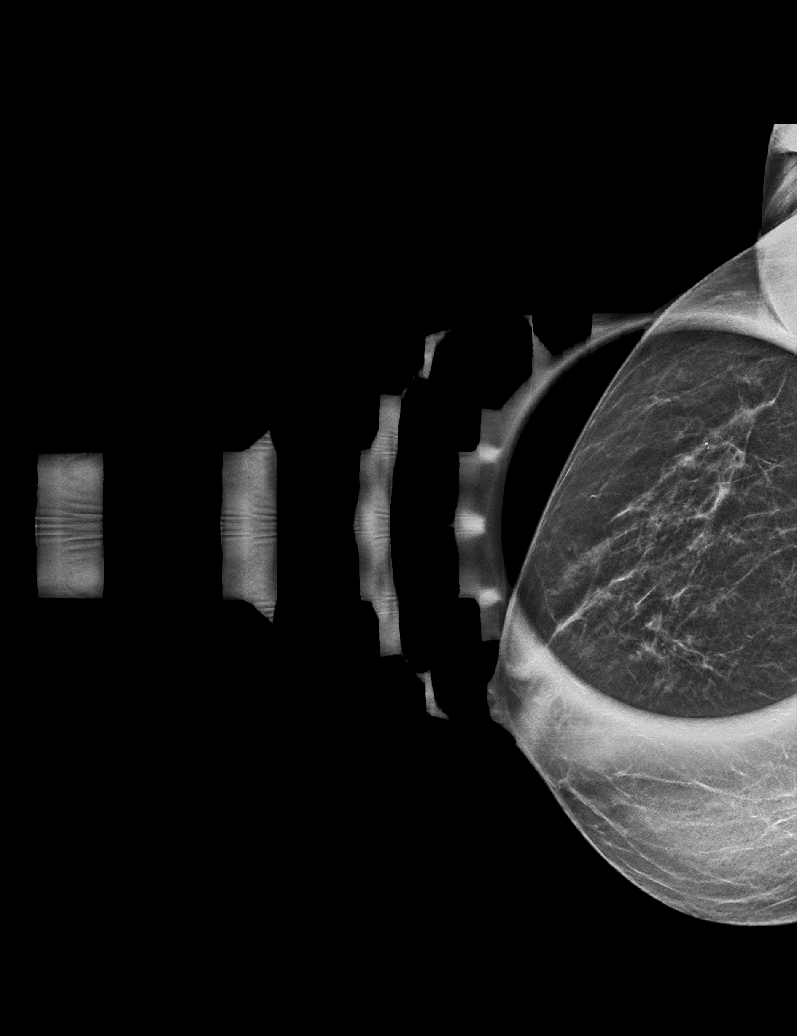

[R ML synth-2D]
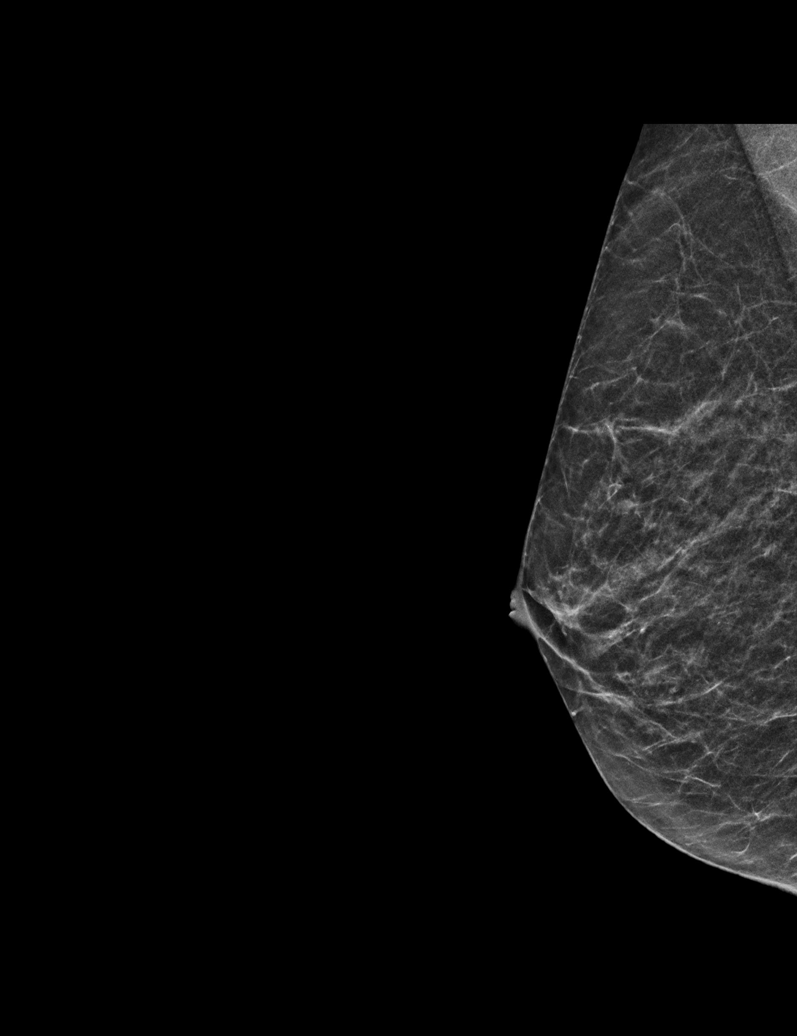

[R MLO synth-2D]
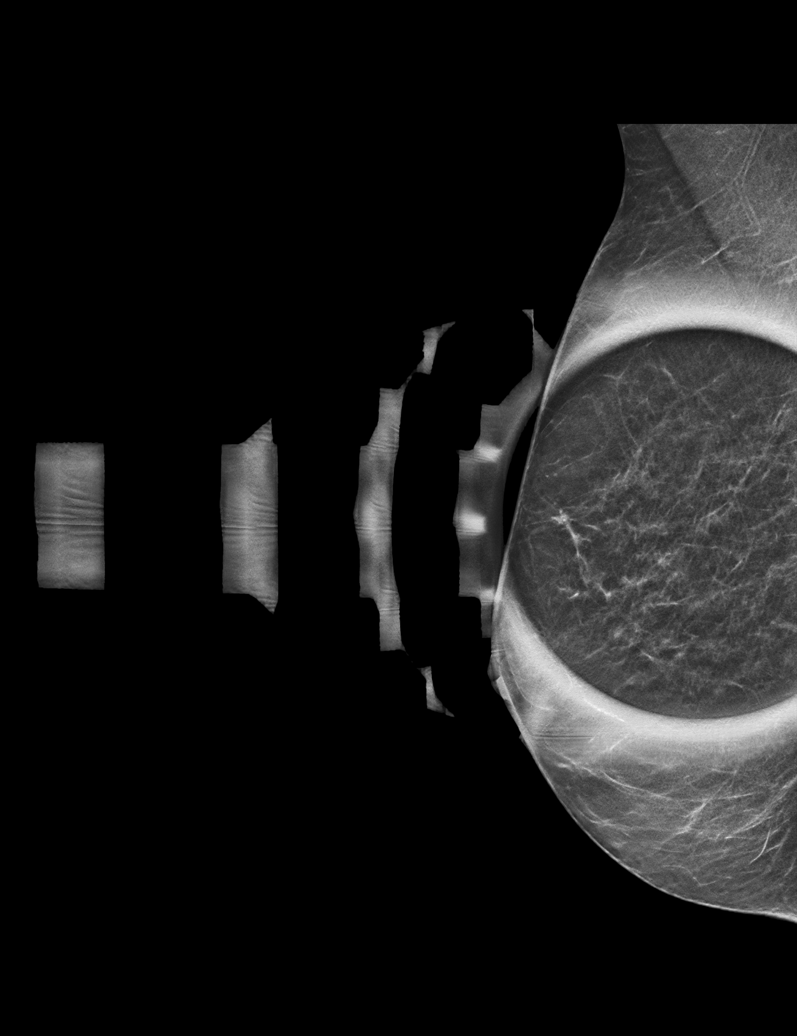

[6 of 21 positions shown; findings below may reference images not displayed]

ACR Breast Density Category b: There are scattered areas of
fibroglandular density.
FINDINGS: On the diagnostic spot-compression images, the possible asymmetry
noted in the upper outer right breast on the current screening exam
disperses consistent with normal fibroglandular tissue. There is no
underlying mass or distortion and no significant residual asymmetry.

On the CC magnification image, there is a group of calcifications in
the far lateral right breast, posterior depth, spanning 6-7 mm in
greatest dimension. On the mL magnification image, the
calcifications appear as 2 separate groups, with a smaller group
more superiorly measuring 2 mm. Calcifications are predominantly
punctate with no associated mass or distortion. There is no
linearity or branching.
IMPRESSION: 1. Probably benign right breast calcifications. Short-term follow-up
recommended.

RECOMMENDATION:
Diagnostic right breast mammography with magnification views in 6
months to reassess the lateral right breast calcifications.

I have discussed the findings and recommendations with the patient.
If applicable, a reminder letter will be sent to the patient
regarding the next appointment.

BI-RADS CATEGORY  3: Probably benign.

## 2023-11-10 ENCOUNTER — Other Ambulatory Visit: Payer: Self-pay | Admitting: Family Medicine

## 2023-11-13 ENCOUNTER — Encounter: Payer: 59 | Admitting: Physical Therapy

## 2023-11-20 ENCOUNTER — Telehealth: Payer: Self-pay | Admitting: Acute Care

## 2023-11-20 ENCOUNTER — Other Ambulatory Visit: Payer: Self-pay | Admitting: Emergency Medicine

## 2023-11-20 DIAGNOSIS — Z87891 Personal history of nicotine dependence: Secondary | ICD-10-CM

## 2023-11-20 DIAGNOSIS — Z122 Encounter for screening for malignant neoplasm of respiratory organs: Secondary | ICD-10-CM

## 2023-11-20 NOTE — Telephone Encounter (Signed)
 Lung Cancer Screening Narrative/Criteria Questionnaire (Cigarette Smokers Only- No Cigars/Pipes/vapes)   Emily Ward   SDMV:12/12/2023 at 1:30 with Emily Ward       03-27-1956   LDCT: 12/16/2023 at 1:40 with [facility]     68 y.o.   Phone: [phone]  Lung Screening Narrative (confirm age 78-77 yrs Medicare / 50-80 yrs Private pay insurance)   Insurance information:[employer] mcr   Referring Provider:Dr. Isaiah Serge   This screening involves an initial phone call with a team member from our program. It is called a shared decision making visit. The initial meeting is required by  insurance and Medicare to make sure you understand the program. This appointment takes about 15-20 minutes to complete. You will complete the screening scan at your scheduled date/time.  This scan takes about 5-10 minutes to complete. You can eat and drink normally before and after the scan.  Criteria questions for Lung Cancer Screening:   Are you a current or former smoker? Former Age began smoking: 68yo   If you are a former smoker, what year did you quit smoking? 2014(within 15 yrs)   To calculate your smoking history, I need an accurate estimate of how many packs of cigarettes you smoked per day and for how many years. (Not just the number of PPD you are now smoking)   Years smoking 33 x Packs per day 3/4 = Pack years 24.75   (at least 20 pack yrs)   (Make sure they understand that we need to know how much they have smoked in the past, not just the number of PPD they are smoking now)  Do you have a personal history of cancer?  No    Do you have a family history of cancer? Yes  (cancer type and and relative) sister - colon  Are you coughing up blood?  No  Have you had unexplained weight loss of 15 lbs or more in the last 6 months? No  It looks like you meet all criteria.  When would be a good time for [place] to schedule you for this screening?   Additional information: N/A

## 2023-11-29 ENCOUNTER — Encounter: Payer: Self-pay | Admitting: Physician Assistant

## 2023-11-29 ENCOUNTER — Ambulatory Visit: Payer: Self-pay

## 2023-11-29 ENCOUNTER — Ambulatory Visit (INDEPENDENT_AMBULATORY_CARE_PROVIDER_SITE_OTHER): Admitting: Family Medicine

## 2023-11-29 ENCOUNTER — Encounter: Payer: Self-pay | Admitting: Family Medicine

## 2023-11-29 VITALS — BP 110/66 | HR 88 | Temp 98.0°F | Resp 20 | Ht 65.0 in | Wt 107.1 lb

## 2023-11-29 DIAGNOSIS — H04129 Dry eye syndrome of unspecified lacrimal gland: Secondary | ICD-10-CM

## 2023-11-29 LAB — C-REACTIVE PROTEIN: CRP: <1 mg/dL (ref 0.5–20.0)

## 2023-11-29 NOTE — Telephone Encounter (Signed)
  Chief Complaint: L eye pain Symptoms: pain, feels like something is in my eye Frequency: somewhat started las night, woke me this morning from sleep Pertinent Negatives: Patient denies fever, injury, contacts, vision changes, exposure to pink eye that she is aware of Disposition: [] ED /[] Urgent Care (no appt availability in office) / [x] Appointment(In office/virtual)/ []  [facility]/ [] Home Care/ [] Refused Recommended Disposition /[]  Mobile Bus/ []  Follow-up with PCP Additional Notes: Pt states that when she was removing makeup last night she felt like her L eye was becoming irritated. Today pt states that her eye feels as though there is something in her L eye. Pt states it woke her from sleep and the eye was draining a clear discharge. Pt denies any new contact or cosmetic products. Pt states that she doesn't believe she was exposed to anyone with pink eye but states that is what it feels like. Pt denies vision changes. Pt sched today with pcp office.  Reason for Disposition  MODERATE eye pain or discomfort (e.g., interferes with normal activities or awakens from sleep; more than mild)  Answer Assessment - Initial Assessment Questions 1. ONSET: "When did the pain start?" (e.g., minutes, hours, days)     This morning, woke the pt 2. TIMING: "Does the pain come and go, or has it been constant since it started?" (e.g., constant, intermittent, fleeting)     constant 3. SEVERITY: "How bad is the pain?"   (Scale 1-10; mild, moderate or severe)   - MILD (1-3): doesn't interfere with normal activities    - MODERATE (4-7): interferes with normal activities or awakens from sleep    - SEVERE (8-10): excruciating pain and patient unable to do normal activities     More annoying that hurting, 5/6 4. LOCATION: "Where does it hurt?"  (e.g., eyelid, eye, cheekbone)     L eye inside the eye 5. CAUSE: "What do you think is causing the pain?"     I don't know if I got something from my  makeup in my eye, not other injury, feels like pinkeye 6. VISION: "Do you have blurred vision or changes in your vision?"      denies 7. EYE DISCHARGE: "Is there any discharge (pus) from the eye(s)?"  If Yes, ask: "What color is it?"      Woke with clear discharge, none at this time 8. FEVER: "Do you have a fever?" If Yes, ask: "What is it, how was it measured, and when did it start?"      denies 9. OTHER SYMPTOMS: "Do you have any other symptoms?" (e.g., headache, nasal discharge, facial rash)      A little nasal discharge, but I think it is allergies  Protocols used: Eye Pain and Other Symptoms-A-AH

## 2023-11-29 NOTE — Progress Notes (Signed)
 Established Patient Office Visit   Subjective  Patient ID: Emily Ward, female    DOB: Jan 07, 1956  Age: 68 y.o. MRN: [id number]  Chief Complaint  Patient presents with   Eye Pain    Feels like something is in the eye and pt has flushed it several time. Pt stated that it started last nigh.    Pt is a 68 yo female followed by Dr.Fry and seen for acute concern.  Pt with L eye irritaiton x 1 day.  Noticed after removing make up.  Flushed eye.  This am eye felt scratchy with mucus like d/c.  Flushed again with improvement.  "Feels like something is underneath".  Pt denies pain, redness, changes in vision.  Wonders if allergies may be contributing to symptoms.  States does not feel like when she scratch her right cornea in the past.  Seen by rheum, previous testing for Sjogren's due to dry mouth was negative.    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   Palpitations 07/30/2017   Shortness of breath 07/30/2017   Hormone replacement therapy (HRT) 03/30/2017   Former smoker 03/30/2017   Other fatigue 06/30/2016   Insomnia 06/30/2016   DJD (degenerative joint disease), cervical 06/30/2016   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/2016   IBS (irritable bowel syndrome) 06/30/2016   Interstitial cystitis 06/30/2016   SHOULDER PAIN, RIGHT 09/05/2010   GERD 03/24/2009   SACROILIAC STRAIN 03/24/2009   Atypical chest pain 01/20/2008   HEMATURIA UNSPECIFIED 10/14/2007   FIBROMYALGIA 07/03/2007   HYPERLIPIDEMIA NEC/NOS 05/16/2007   HEADACHE 05/09/2007   Past Medical History:  Diagnosis Date   Anxiety    Cervical disc disease 2006   COPD (chronic obstructive pulmonary disease) (HCC)    Depression    Emphysema lung  (HCC)    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     Fibromyalgia    sees Dr. 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 Norwood Levo, adenmatous polyp, repeat in 6 months   CYSTOSCOPY N/A 09/01/2018   Procedure: CYSTOSCOPY;  Surgeon: Jerene Bears, MD;  Location: [facility];  Service: Gynecology;  Laterality: N/A;   DILATATION & CURETTAGE/HYSTEROSCOPY WITH MYOSURE N/A 04/12/2017   Procedure: DILATATION & CURETTAGE/HYSTEROSCOPY;  Surgeon: Jerene Bears, MD;  Location: [facility];  Service: Gynecology;  Laterality: N/A;   epidural thoracic spine injection  06/07/2022   facet joint block  03/30/2021   per patient, performed by Dr.  Newton   facet joint block  01/31/2022   per patient, performed by Dr. Alvester Morin   HYSTEROSCOPY     years ago with Dr Arelia Sneddon-? 2000   KNEE ARTHROSCOPY Left 10/24/2022   TOTAL LAPAROSCOPIC HYSTERECTOMY WITH SALPINGECTOMY Bilateral 09/01/2018   Procedure: TOTAL LAPAROSCOPIC HYSTERECTOMY WITH SALPINGECTOMY  BSO, REPAIR OF PERINEAL LACERATION;  Surgeon: Jerene Bears, MD;  Location: [facility];  Service: Gynecology;  Laterality: Bilateral;  possible BSO   URETHRAL DILATION     Social History   Tobacco Use   Smoking status: Former    Current packs/day: 0.00    Average packs/day: 1 pack/day for  20.0 years (20.0 ttl pk-yrs)    Types: Cigarettes    Start date: 04/02/1993    Quit date: 04/02/2013    Years since quitting: 10.6    Passive exposure: Never   Smokeless tobacco: Never  Vaping Use   Vaping status: Never Used  Substance Use Topics   Alcohol use: Yes    Alcohol/week: 1.0 standard drink of alcohol    Types: 1 Glasses of wine per week   Drug use: Never   Family History  Problem Relation Age of Onset   Fibrocystic breast disease Mother        lumpectomy in 50-60   Heart attack Father    Congestive Heart Failure Father    Diabetes type II Sister        and ? brother   Colon cancer Maternal Grandmother    Arthritis Son        psoriatic   Cancer Other        breast/fhx   Heart disease Other        fhx   Esophageal cancer Neg Hx    Rectal cancer Neg Hx    Stomach cancer Neg Hx    Colon polyps Neg Hx    Allergies  Allergen Reactions   Divalproex Sodium     Drug inducted hepatitis Other reaction(s): Unknown Other reaction(s): Unknown   Nsaids     Upset stomach   Zoloft [Sertraline]     Arrhythmia, sweating, anxiety, irritability, headaches, tremors, nausea      ROS Negative unless stated above    Objective:     BP 110/66   Pulse 88   Temp 98 F (36.7 C)   Resp 20   Ht 5\' 5"  (1.651 m)   Wt 107 lb 2 oz (48.6 kg)   LMP 09/03/2000 (Approximate)   SpO2 97%   BMI 17.83 kg/m  BP Readings from Last 3 Encounters:  11/29/23 110/66  08/26/23 108/71  08/16/23 116/77   Wt Readings from Last 3 Encounters:  11/29/23 107 lb 2 oz (48.6 kg)  08/26/23 105 lb (47.6 kg)  08/16/23 105 lb 12.8 oz (48 kg)    Physical Exam Constitutional:      Appearance: Normal appearance.  HENT:     Head: Normocephalic and atraumatic.  Eyes:     General: Lids are normal. Lids are everted, no foreign bodies appreciated.     Extraocular Movements: Extraocular movements intact.     Comments: Cornea appears dry, blood vessels appear prominent  Pulmonary:     Effort:  Pulmonary effort is normal.  Musculoskeletal:        General: Normal range of motion.  Skin:    General: Skin is warm and dry.  Neurological:     Mental Status: She is alert and oriented to person, place, and time.  No results found for any visits on 11/29/23.    Assessment & Plan:  Dry eye -     Sjogren's syndrome antibods(ssa + ssb) -     C-reactive protein  Patient presents with acute L eye irritation.  Discussed causes including corneal abrasion, allergies however would anticipate both eyes to be irritated, conjunctivitis, dry eye/Sjogren's.  As eye visibly dry on exam discussed using OTC lubricating eyedrops.  Unable to assess for corneal abrasion due to limited supplies in clinic.  Patient given strict precautions.  Autoimmune testing for Sjogren's ordered.  Return if symptoms worsen or fail to improve.   Deeann Saint, MD

## 2023-12-02 LAB — SJOGREN'S SYNDROME ANTIBODS(SSA + SSB)
SSA (Ro) (ENA) Antibody, IgG: <1 AI
SSB (La) (ENA) Antibody, IgG: <1 AI

## 2023-12-02 MED ORDER — TOBRAMYCIN-DEXAMETHASONE 0.3-0.1 % OP SUSP: 2.0000 [drp] | OPHTHALMIC | 0 refills | Status: DC

## 2023-12-02 NOTE — Telephone Encounter (Signed)
 Pt Rx called in to her pharmacy, pt pharmacy state that they will notify pt when ready

## 2023-12-02 NOTE — Telephone Encounter (Signed)
 Copied from CRM [id number]. Topic: Appointments - Appointment Scheduling >> Nov 29, 2023 10:37 AM Emily Ward L wrote: Patient/patient representative is calling to schedule an appointment. Refer to attachments for appointment information.  Done

## 2023-12-05 ENCOUNTER — Encounter: Payer: Self-pay | Admitting: Family Medicine

## 2023-12-12 ENCOUNTER — Encounter: Payer: Self-pay | Admitting: Adult Health

## 2023-12-12 ENCOUNTER — Ambulatory Visit: Admitting: Adult Health

## 2023-12-12 DIAGNOSIS — Z87891 Personal history of nicotine dependence: Secondary | ICD-10-CM | POA: Diagnosis not present

## 2023-12-12 NOTE — Progress Notes (Signed)
  Virtual Visit via Telephone Note  I connected with Frutoso Chase , 12/12/23 1:55 PM by a telemedicine application and verified that I am speaking with the correct person using two identifiers.  Location: Patient: home Provider: home   I discussed the limitations of evaluation and management by telemedicine and the availability of in person appointments. The patient expressed understanding and agreed to proceed.   Shared Decision Making Visit Lung Cancer Screening Program [phone])   Eligibility: 68 y.o. Pack Years Smoking History Calculation = 25 pack years (# packs/per year x # years smoked) Recent History of coughing up blood  no Unexplained weight loss? no ( >Than 15 pounds within the last 6 months ) Prior History Lung / other cancer no (Diagnosis within the last 5 years already requiring surveillance chest CT Scans). Smoking Status Former Smoker Former Smokers: Years since quit: 11 years  Quit Date: 2014  Visit Components: Discussion included one or more decision making aids. YES Discussion included risk/benefits of screening. YES Discussion included potential follow up diagnostic testing for abnormal scans. YES Discussion included meaning and risk of over diagnosis. YES Discussion included meaning and risk of False Positives. YES Discussion included meaning of total radiation exposure. YES  Counseling Included: Importance of adherence to annual lung cancer LDCT screening. YES Impact of comorbidities on ability to participate in the program. YES Ability and willingness to under diagnostic treatment. YES  Smoking Cessation Counseling: Former Smokers:  Discussed the importance of maintaining cigarette abstinence. yes Diagnosis Code: Personal History of Nicotine Dependence. U04.540 Information about tobacco cessation classes and interventions provided to patient. Yes Patient provided with "ticket" for LDCT Scan. yes Written Order for Lung Cancer Screening with  LDCT placed in Epic. Yes (CT Chest Lung Cancer Screening Low Dose W/O CM) [id number]  Z12.2-Screening of respiratory organs Z87.891-Personal history of nicotine dependence   Danford Bad 12/12/23

## 2023-12-12 NOTE — Patient Instructions (Signed)

## 2023-12-13 ENCOUNTER — Encounter: Payer: Self-pay | Admitting: Family Medicine

## 2023-12-13 ENCOUNTER — Ambulatory Visit (INDEPENDENT_AMBULATORY_CARE_PROVIDER_SITE_OTHER): Payer: Medicare Other | Admitting: Family Medicine

## 2023-12-13 VITALS — BP 98/60 | HR 80 | Temp 97.8°F | Ht 65.0 in | Wt 107.0 lb

## 2023-12-13 DIAGNOSIS — E785 Hyperlipidemia, unspecified: Secondary | ICD-10-CM | POA: Diagnosis not present

## 2023-12-13 DIAGNOSIS — N3010 Interstitial cystitis (chronic) without hematuria: Secondary | ICD-10-CM

## 2023-12-13 DIAGNOSIS — J439 Emphysema, unspecified: Secondary | ICD-10-CM | POA: Diagnosis not present

## 2023-12-13 DIAGNOSIS — D472 Monoclonal gammopathy: Secondary | ICD-10-CM

## 2023-12-13 DIAGNOSIS — M797 Fibromyalgia: Secondary | ICD-10-CM | POA: Diagnosis not present

## 2023-12-13 DIAGNOSIS — K589 Irritable bowel syndrome without diarrhea: Secondary | ICD-10-CM

## 2023-12-13 DIAGNOSIS — E213 Hyperparathyroidism, unspecified: Secondary | ICD-10-CM

## 2023-12-13 DIAGNOSIS — R739 Hyperglycemia, unspecified: Secondary | ICD-10-CM

## 2023-12-13 DIAGNOSIS — K219 Gastro-esophageal reflux disease without esophagitis: Secondary | ICD-10-CM

## 2023-12-13 DIAGNOSIS — F5101 Primary insomnia: Secondary | ICD-10-CM

## 2023-12-13 DIAGNOSIS — M810 Age-related osteoporosis without current pathological fracture: Secondary | ICD-10-CM

## 2023-12-13 NOTE — Progress Notes (Signed)
 Subjective:    Patient ID: Emily Ward, female    DOB: April 17, 1956, 68 y.o.   MRN: [id number]  HPI Here to follow up on issues. She has no complaints today. Her fibromyalgia is stable. She exercises regularly. Her GERD and IBS are stable. She follows a vegan diet for the most part. She sees Dr. Hyacinth Meeker for mammograms and bone density evaluations. Her IC has been stable with onoy 1-2 flares a year.    Review of Systems  Constitutional: Negative.   HENT: Negative.    Eyes: Negative.   Respiratory: Negative.    Cardiovascular: Negative.   Gastrointestinal: Negative.   Genitourinary:  Negative for decreased urine volume, difficulty urinating, dyspareunia, dysuria, enuresis, flank pain, frequency, hematuria, pelvic pain and urgency.  Musculoskeletal:  Positive for neck pain.  Skin: Negative.   Neurological: Negative.  Negative for headaches.  Psychiatric/Behavioral: Negative.         Objective:   Physical Exam Constitutional:      General: She is not in acute distress.    Appearance: Normal appearance. She is well-developed.  HENT:     Head: Normocephalic and atraumatic.     Right Ear: External ear normal.     Left Ear: External ear normal.     Nose: Nose normal.     Mouth/Throat:     Pharynx: No oropharyngeal exudate.  Eyes:     General: No scleral icterus.    Conjunctiva/sclera: Conjunctivae normal.     Pupils: Pupils are equal, round, and reactive to light.  Neck:     Thyroid: No thyromegaly.     Vascular: No JVD.  Cardiovascular:     Rate and Rhythm: Normal rate and regular rhythm.     Pulses: Normal pulses.     Heart sounds: Normal heart sounds. No murmur heard.    No friction rub. No gallop.  Pulmonary:     Effort: Pulmonary effort is normal. No respiratory distress.     Breath sounds: Normal breath sounds. No wheezing or rales.  Chest:     Chest wall: No tenderness.  Abdominal:     General: Bowel sounds are normal. There is no distension.      Palpations: Abdomen is soft. There is no mass.     Tenderness: There is no abdominal tenderness. There is no guarding or rebound.  Musculoskeletal:        General: No tenderness. Normal range of motion.     Cervical back: Normal range of motion and neck supple.  Lymphadenopathy:     Cervical: No cervical adenopathy.  Skin:    General: Skin is warm and dry.     Findings: No erythema or rash.  Neurological:     General: No focal deficit present.     Mental Status: She is alert and oriented to person, place, and time.     Cranial Nerves: No cranial nerve deficit.     Motor: No abnormal muscle tone.     Coordination: Coordination normal.     Deep Tendon Reflexes: Reflexes are normal and symmetric. Reflexes normal.  Psychiatric:        Mood and Affect: Mood normal.        Behavior: Behavior normal.        Thought Content: Thought content normal.        Judgment: Judgment normal.           Assessment & Plan:  Her IBS and GERD are stable. Her IC and fibromyalgia are stable.  Get fasting labs to check lipids, etc. She will be due for another colonoscopy in June this year. We spent a total of ( 33  ) minutes reviewing records and discussing these issues.  Gershon Crane, MD

## 2023-12-16 ENCOUNTER — Ambulatory Visit
Admission: RE | Admit: 2023-12-16 | Discharge: 2023-12-16 | Disposition: A | Discharge status: Home / Self Care | Source: Ambulatory Visit | Attending: Acute Care | Admitting: Acute Care

## 2023-12-16 DIAGNOSIS — Z87891 Personal history of nicotine dependence: Secondary | ICD-10-CM

## 2023-12-16 DIAGNOSIS — Z122 Encounter for screening for malignant neoplasm of respiratory organs: Secondary | ICD-10-CM

## 2023-12-17 NOTE — Progress Notes (Signed)
 Office Visit Note  Patient: Emily Ward             Date of Birth: 05/09/1956           MRN: [id number]             PCP: Donley Furth, MD Referring: Donley Furth, MD Visit Date: 12/31/2023 Occupation: @GUAROCC @  Subjective:  Medication management   History of Present Illness: Emily Ward is a 68 y.o. female with osteoarthritis, degenerative disc disease and osteoporosis.  She returns today after her last visit in December 2024.  She states she has been going to integrative therapies.  She states that she has been experiencing some discomfort in her cervical spine recently.  She gets relief after each physical therapy session.  She has been trying to do some exercises at home.  She continues to have some discomfort in her hands especially her right CMC joint.  She also has intermittent discomfort in her left knee.  She continues to have some thoracic muscle spasm.  She is not having much discomfort in the lower back.  She continues to have dry mouth and dry eye symptoms and generalized pain from fibromyalgia.  She takes Ambien  2.5 mg p.o. nightly for insomnia.  She occasionally takes Flexeril  10 mg at bedtime for muscle spasms.  She had her last Prolia  injection in December 2024.  She is a scheduled to have next Prolia  injection in June.  She has dental work coming up next week.  She has been taking calcium  and vitamin D .  She wants to hold off anabolic agents for now.    Activities of Daily Living:  Patient reports morning stiffness for 30 minutes.   Patient Reports nocturnal pain.  Difficulty dressing/grooming: Denies Difficulty climbing stairs: Denies Difficulty getting out of chair: Denies Difficulty using hands for taps, buttons, cutlery, and/or writing: Reports  Review of Systems  Constitutional:  Negative for fatigue.  HENT:  Positive for mouth dryness. Negative for mouth sores.   Eyes:  Positive for dryness.  Respiratory:  Negative for shortness of  breath.   Cardiovascular:  Negative for chest pain and palpitations.  Gastrointestinal:  Negative for blood in stool, constipation and diarrhea.  Endocrine: Negative for increased urination.  Genitourinary:  Positive for involuntary urination.  Musculoskeletal:  Positive for joint pain, joint pain, myalgias, muscle weakness, morning stiffness and myalgias. Negative for gait problem, joint swelling and muscle tenderness.  Skin:  Negative for color change, rash, hair loss and sensitivity to sunlight.  Allergic/Immunologic: Negative for susceptible to infections.  Neurological:  Positive for headaches. Negative for dizziness.  Hematological:  Negative for swollen glands.  Psychiatric/Behavioral:  Positive for depressed mood. Negative for sleep disturbance. The patient is nervous/anxious.     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   Palpitations 07/30/2017   Shortness of breath 07/30/2017   Hormone replacement therapy (HRT) 03/30/2017   Former smoker 03/30/2017   Other fatigue 06/30/2016   Insomnia 06/30/2016   DJD (degenerative joint disease), cervical 06/30/2016   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/2016   IBS (irritable bowel syndrome) 06/30/2016   Interstitial cystitis 06/30/2016   SHOULDER PAIN, RIGHT 09/05/2010   GERD 03/24/2009   SACROILIAC STRAIN 03/24/2009  Atypical chest pain 01/20/2008   HEMATURIA UNSPECIFIED 10/14/2007   Fibromyalgia 07/03/2007   Dyslipidemia 05/16/2007   HEADACHE 05/09/2007    Past Medical History:  Diagnosis Date   Anxiety    Cervical disc disease 2006   COPD (chronic obstructive pulmonary disease) (HCC)    Depression    Emphysema lung (HCC)    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     Fibromyalgia    sees Dr. 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 brother   Colon cancer Maternal Grandmother    Arthritis Son        psoriatic   Cancer Other        breast/fhx   Heart disease Other        fhx   Esophageal cancer Neg Hx    Rectal cancer Neg Hx    Stomach cancer Neg Hx    Colon polyps Neg Hx    Past Surgical History:  Procedure Laterality Date   BREAST BIOPSY Right 01/24/2023   MM RT BREAST BX W LOC DEV 1ST LESION IMAGE BX SPEC STEREO GUIDE 01/24/2023 [facility]   BREAST BIOPSY Right 01/24/2023   MM RT BREAST BX W LOC DEV EA AD LESION IMG BX SPEC STEREO GUIDE 01/24/2023 [facility]   CERVICAL LAMINECTOMY  2006   CESAREAN SECTION  1993   CHOLECYSTECTOMY  06/2002   COLONOSCOPY  02/11/2023   per Dr. Garth Kansky, adenmatous polyps, repeat in one year   CYSTOSCOPY N/A 09/01/2018   Procedure: CYSTOSCOPY;  Surgeon: Lillian Rein, MD;  Location: [facility];  Service: Gynecology;  Laterality: N/A;   DILATATION & CURETTAGE/HYSTEROSCOPY WITH MYOSURE N/A 04/12/2017   Procedure: DILATATION & CURETTAGE/HYSTEROSCOPY;  Surgeon: Lillian Rein, MD;  Location: [facility];  Service: Gynecology;  Laterality: N/A;   epidural thoracic spine injection  06/07/2022   facet joint block  03/30/2021   per patient, performed by Dr. Daisey Dryer   facet joint block  01/31/2022   per patient, performed by Dr. Daisey Dryer   HYSTEROSCOPY     years ago with Dr Cloretta Danes-? 2000   KNEE ARTHROSCOPY Left 10/24/2022   TOTAL  LAPAROSCOPIC HYSTERECTOMY WITH SALPINGECTOMY Bilateral 09/01/2018   Procedure: TOTAL LAPAROSCOPIC HYSTERECTOMY WITH SALPINGECTOMY  BSO, REPAIR OF PERINEAL LACERATION;  Surgeon: Lillian Rein, MD;  Location: [facility];  Service: Gynecology;  Laterality: Bilateral;  possible BSO   URETHRAL DILATION     Social History   Social History Narrative   Not on file   Immunization History  Administered Date(s) Administered   Influenza Whole 06/30/2007, 05/04/2010   Influenza,inj,Quad PF,6+ Mos 07/12/2017, 05/23/2018, 09/08/2021   Influenza-Unspecified 06/02/2014, 06/03/2016, 06/20/2020   MMR 06/30/2007   Moderna Sars-Covid-2 Vaccination 09/07/2019, 10/12/2019, 07/13/2020, 02/21/2021   PNEUMOCOCCAL CONJUGATE-20 10/03/2022   Pneumococcal Polysaccharide-23 03/23/2019   Td 09/03/1998, 05/05/2009   Tdap 10/22/2018  Objective: Vital Signs: BP 103/73 (BP Location: Left Arm, Patient Position: Sitting, Cuff Size: Normal)   Pulse 83   Resp 14   Ht 5\' 5"  (1.651 m)   Wt 106 lb 9.6 oz (48.4 kg)   LMP 09/03/2000 (Approximate)   BMI 17.74 kg/m    Physical Exam Vitals and nursing note reviewed.  Constitutional:      Appearance: She is well-developed.  HENT:     Head: Normocephalic and atraumatic.  Eyes:     Conjunctiva/sclera: Conjunctivae normal.  Cardiovascular:     Rate and Rhythm: Normal rate and regular rhythm.     Heart sounds: Normal heart sounds.  Pulmonary:     Effort: Pulmonary effort is normal.     Breath sounds: Normal breath sounds.  Abdominal:     General: Bowel sounds are normal.     Palpations: Abdomen is soft.  Musculoskeletal:     Cervical back: Normal range of motion.  Lymphadenopathy:     Cervical: No cervical adenopathy.  Skin:    General: Skin is warm and dry.     Capillary Refill: Capillary refill takes less than 2 seconds.  Neurological:     Mental Status: She is alert and oriented to person, place, and time.  Psychiatric:        Behavior:  Behavior normal.      Musculoskeletal Exam: Cervical, thoracic and lumbar spine with good range of motion.  She had bilateral trapezius spasm.  Shoulders, elbows, wrist joints, MCPs PIPs and DIPs with good range of motion.  She had tenderness over her right CMC joint.  PIP and DIP thickening was noted.  Hip joints and knee joints in good range of motion.  She had bilateral bunions and first MTP thickening without any synovitis.  CDAI Exam: CDAI Score: -- Patient Global: --; Provider Global: -- Swollen: --; Tender: -- Joint Exam 12/31/2023   No joint exam has been documented for this visit   There is currently no information documented on the homunculus. Go to the Rheumatology activity and complete the homunculus joint exam.  Investigation: No additional findings.  Imaging: No results found.  Recent Labs: Lab Results  Component Value Date   WBC 6.0 07/03/2023   HGB 14.5 07/03/2023   PLT 197 07/03/2023   NA 139 08/14/2023   K 4.6 08/14/2023   CL 104 08/14/2023   CO2 28 08/14/2023   GLUCOSE 90 08/14/2023   BUN 19 08/14/2023   CREATININE 0.64 08/14/2023   BILITOT 0.3 08/14/2023   ALKPHOS 73 02/12/2023   AST 20 08/14/2023   ALT 9 08/14/2023   PROT 6.3 08/14/2023   ALBUMIN 4.4 02/12/2023   CALCIUM  9.4 08/14/2023   GFRAA >60 10/22/2018    Speciality Comments: Prolia  started 2019.  Last injection July 31, 2021.  Patient wants to switch Prolia  injections to our office.  Procedures:  No procedures performed Allergies: Divalproex sodium, Nsaids, and Zoloft [sertraline]   Assessment / Plan:     Visit Diagnoses: Age-related osteoporosis without current pathological fracture - DEXA 07/24/23:  The BMD measured at Femur Total Left is 0.591 g/cm2 with a T-score of -3.3. Prolia  since 2019 initially prescribed by Dr. Ronelle Coffee.  We discussed Evenity for the last couple of visits but she decided against it.  She wants to continue Prolia  until her next DEXA scan in November 2026.  She  has been taking calcium  and vitamin D .  Her next Prolia  injection is due in June 2025.  She plans to get  CMP and vitamin D  level through Dr. Leonce Ralph office.  Medication monitoring encounter - last Prolia  injection: 08/16/2023  Primary osteoarthritis of both hands-she has osteoarthritis in bilateral hands with CMC PIP and DIP thickening.  She has been having discomfort in the right CMC joint.  A prescription for a right CMC brace was given.  Chronic pain of left knee -she has intermittent discomfort in the left knee.  She is under are of Dr. Brunilda Capra and was diagnosed with meniscal tear.  She underwent meniscal tear repair in February 2024.  Trapezius muscle spasm-she continues to have bilateral trapezius spasm.  She is going to physical therapy which has been helpful.  DDD (degenerative disc disease), cervical -she history of some discomfort in the cervical region.  She had fairly good range of motion.  MRI performed on 05/03/20.  She establish care with Dr. Daisey Dryer in September 2021.  She had facet joint injections on 06/02/2020, 03/30/2021, and 01/31/2022.  Pain in thoracic spine -she gives history of intermittent discomfort.  Thoracic spine pain resolved after T9-T10 epidural injection done by Dr. Daisey Dryer.  Spondylosis of lumbar spine-she denies any discomfort today.  Sicca syndrome (HCC) - 07/03/2023: ANA neg, RF neg, Ro antibody neg, line body neg, double-stranded neg, complements WNL, Smith antibody neg, RNP antibody neg, SCL 70 antibody neg.  Fibromyalgia -she continues to have some generalized pain and discomfort from fibromyalgia.  She has been doing stretching exercises.  She takes Flexeril  10 mg q hs  as needed for muscle spasms.  Primary insomnia -she continues to have insomnia.  She has reduced the dose of Ambien .  She is on Ambien  2.5 mg at bedtime for insomnia.  Other medical problems are listed as follows:  TMJ (temporomandibular joint syndrome)  Interstitial cystitis  History of  gastroesophageal reflux (GERD)  History of IBS  Anxiety and depression  Vitamin D  deficiency  Orders: No orders of the defined types were placed in this encounter.  No orders of the defined types were placed in this encounter.    Follow-Up Instructions: Return in about 6 months (around 07/01/2024) for Osteoarthritis, Osteoporosis.   Nicholas Bari, MD  Note - This record has been created using Animal nutritionist.  Chart creation errors have been sought, but may not always  have been located. Such creation errors do not reflect on  the standard of medical care.

## 2023-12-30 ENCOUNTER — Other Ambulatory Visit: Payer: Self-pay | Admitting: Family Medicine

## 2023-12-30 DIAGNOSIS — Z1231 Encounter for screening mammogram for malignant neoplasm of breast: Secondary | ICD-10-CM

## 2023-12-31 ENCOUNTER — Encounter: Payer: Self-pay | Admitting: Rheumatology

## 2023-12-31 ENCOUNTER — Telehealth: Payer: Self-pay

## 2023-12-31 ENCOUNTER — Ambulatory Visit: Payer: 59 | Attending: Rheumatology | Admitting: Rheumatology

## 2023-12-31 VITALS — BP 103/73 | HR 83 | Resp 14 | Ht 65.0 in | Wt 106.6 lb

## 2023-12-31 DIAGNOSIS — M19041 Primary osteoarthritis, right hand: Secondary | ICD-10-CM

## 2023-12-31 DIAGNOSIS — M3500 Sicca syndrome, unspecified: Secondary | ICD-10-CM

## 2023-12-31 DIAGNOSIS — E559 Vitamin D deficiency, unspecified: Secondary | ICD-10-CM

## 2023-12-31 DIAGNOSIS — M19042 Primary osteoarthritis, left hand: Secondary | ICD-10-CM

## 2023-12-31 DIAGNOSIS — Z5181 Encounter for therapeutic drug level monitoring: Secondary | ICD-10-CM | POA: Diagnosis not present

## 2023-12-31 DIAGNOSIS — Z8719 Personal history of other diseases of the digestive system: Secondary | ICD-10-CM

## 2023-12-31 DIAGNOSIS — M47816 Spondylosis without myelopathy or radiculopathy, lumbar region: Secondary | ICD-10-CM

## 2023-12-31 DIAGNOSIS — N3010 Interstitial cystitis (chronic) without hematuria: Secondary | ICD-10-CM

## 2023-12-31 DIAGNOSIS — M26609 Unspecified temporomandibular joint disorder, unspecified side: Secondary | ICD-10-CM

## 2023-12-31 DIAGNOSIS — F32A Depression, unspecified: Secondary | ICD-10-CM

## 2023-12-31 DIAGNOSIS — M25562 Pain in left knee: Secondary | ICD-10-CM

## 2023-12-31 DIAGNOSIS — M5030 Other cervical disc degeneration, unspecified cervical region: Secondary | ICD-10-CM

## 2023-12-31 DIAGNOSIS — F419 Anxiety disorder, unspecified: Secondary | ICD-10-CM

## 2023-12-31 DIAGNOSIS — M797 Fibromyalgia: Secondary | ICD-10-CM

## 2023-12-31 DIAGNOSIS — G8929 Other chronic pain: Secondary | ICD-10-CM

## 2023-12-31 DIAGNOSIS — M546 Pain in thoracic spine: Secondary | ICD-10-CM

## 2023-12-31 DIAGNOSIS — M810 Age-related osteoporosis without current pathological fracture: Secondary | ICD-10-CM

## 2023-12-31 DIAGNOSIS — F5101 Primary insomnia: Secondary | ICD-10-CM

## 2023-12-31 DIAGNOSIS — M62838 Other muscle spasm: Secondary | ICD-10-CM

## 2023-12-31 NOTE — Telephone Encounter (Signed)
 Copied from CRM [id number]. Topic: Clinical - Request for Lab/Test Order >> Dec 31, 2023  3:17 PM Emily Ward wrote: Reason for CRM: Patient would like to know if vitamin d  lab could be added.

## 2024-01-01 ENCOUNTER — Other Ambulatory Visit: Payer: Self-pay | Admitting: Family Medicine

## 2024-01-02 ENCOUNTER — Encounter: Payer: Self-pay | Admitting: Internal Medicine

## 2024-01-02 ENCOUNTER — Other Ambulatory Visit: Payer: Self-pay | Admitting: Family Medicine

## 2024-01-03 NOTE — Addendum Note (Signed)
 Addended by: Corita Diego A on: 01/03/2024 07:57 AM   Modules accepted: Orders

## 2024-01-03 NOTE — Telephone Encounter (Signed)
 Done

## 2024-01-06 ENCOUNTER — Other Ambulatory Visit (INDEPENDENT_AMBULATORY_CARE_PROVIDER_SITE_OTHER)

## 2024-01-06 DIAGNOSIS — E559 Vitamin D deficiency, unspecified: Secondary | ICD-10-CM | POA: Diagnosis not present

## 2024-01-06 DIAGNOSIS — R739 Hyperglycemia, unspecified: Secondary | ICD-10-CM

## 2024-01-06 DIAGNOSIS — E785 Hyperlipidemia, unspecified: Secondary | ICD-10-CM | POA: Diagnosis not present

## 2024-01-06 LAB — CBC WITH DIFFERENTIAL/PLATELET
Basophils Absolute: 0 10*3/uL (ref 0.0–0.1)
Basophils Relative: 0.7 % (ref 0.0–3.0)
Eosinophils Absolute: 0.1 10*3/uL (ref 0.0–0.7)
Eosinophils Relative: 2.6 % (ref 0.0–5.0)
HCT: 43.8 % (ref 36.0–46.0)
Hemoglobin: 14.5 g/dL (ref 12.0–15.0)
Lymphocytes Relative: 32.3 % (ref 12.0–46.0)
Lymphs Abs: 1.6 10*3/uL (ref 0.7–4.0)
MCHC: 33.2 g/dL (ref 30.0–36.0)
MCV: 97.6 fl (ref 78.0–100.0)
Monocytes Absolute: 0.4 10*3/uL (ref 0.1–1.0)
Monocytes Relative: 7.4 % (ref 3.0–12.0)
Neutro Abs: 2.9 10*3/uL (ref 1.4–7.7)
Neutrophils Relative %: 57 % (ref 43.0–77.0)
Platelets: 186 10*3/uL (ref 150.0–400.0)
RBC: 4.49 Mil/uL (ref 3.87–5.11)
RDW: 13.3 % (ref 11.5–15.5)
WBC: 5.1 10*3/uL (ref 4.0–10.5)

## 2024-01-06 LAB — HEPATIC FUNCTION PANEL
ALT: 10 U/L (ref 0–35)
AST: 22 U/L (ref 0–37)
Albumin: 4.3 g/dL (ref 3.5–5.2)
Alkaline Phosphatase: 67 U/L (ref 39–117)
Bilirubin, Direct: 0.1 mg/dL (ref 0.0–0.3)
Total Bilirubin: 0.4 mg/dL (ref 0.2–1.2)
Total Protein: 7 g/dL (ref 6.0–8.3)

## 2024-01-06 LAB — BASIC METABOLIC PANEL WITH GFR
BUN: 19 mg/dL (ref 6–23)
CO2: 28 meq/L (ref 19–32)
Calcium: 9.8 mg/dL (ref 8.4–10.5)
Chloride: 104 meq/L (ref 96–112)
Creatinine, Ser: 0.55 mg/dL (ref 0.40–1.20)
GFR: 96.22 mL/min (ref >60.00)
Glucose, Bld: 95 mg/dL (ref 70–99)
Potassium: 4.4 meq/L (ref 3.5–5.1)
Sodium: 140 meq/L (ref 135–145)

## 2024-01-06 LAB — LIPID PANEL
Cholesterol: 191 mg/dL (ref 0–200)
HDL: 81.2 mg/dL (ref >39.00)
LDL Cholesterol: 96 mg/dL (ref 0–99)
NonHDL: 109.9
Total CHOL/HDL Ratio: 2
Triglycerides: 69 mg/dL (ref 0.0–149.0)
VLDL: 13.8 mg/dL (ref 0.0–40.0)

## 2024-01-06 LAB — HEMOGLOBIN A1C: Hgb A1c MFr Bld: 5.4 % (ref 4.6–6.5)

## 2024-01-06 LAB — VITAMIN D 25 HYDROXY (VIT D DEFICIENCY, FRACTURES): VITD: 47.31 ng/mL (ref 30.00–100.00)

## 2024-01-06 LAB — TSH: TSH: 2.08 u[IU]/mL (ref 0.35–5.50)

## 2024-01-06 NOTE — Progress Notes (Signed)
 Vitamin D  is in the desirable range.

## 2024-01-08 ENCOUNTER — Other Ambulatory Visit (HOSPITAL_BASED_OUTPATIENT_CLINIC_OR_DEPARTMENT_OTHER): Payer: Self-pay | Admitting: *Deleted

## 2024-01-08 ENCOUNTER — Encounter: Payer: Self-pay | Admitting: *Deleted

## 2024-01-08 DIAGNOSIS — Z9229 Personal history of other drug therapy: Secondary | ICD-10-CM

## 2024-01-08 MED ORDER — ESTRADIOL 1 MG PO TABS
1.0000 mg | ORAL_TABLET | Freq: Every day | ORAL | 0 refills | Status: DC
Start: 2024-01-08 — End: 2024-01-15

## 2024-01-08 NOTE — Progress Notes (Signed)
 Pharmacy requests refill on estradiol . Pt has appt next week. Refill sent.

## 2024-01-14 ENCOUNTER — Telehealth: Payer: Self-pay | Admitting: Pharmacist

## 2024-01-14 NOTE — Telephone Encounter (Signed)
 Prolia  scheduled for 02/17/24 at [facility]. Labs completed on 01/06/2024. Calcium  and Vitamin D  wnl  Geraldene Kleine, PharmD, MPH, BCPS, CPP Clinical Pharmacist (Rheumatology and Pulmonology)

## 2024-01-15 ENCOUNTER — Encounter (HOSPITAL_BASED_OUTPATIENT_CLINIC_OR_DEPARTMENT_OTHER): Payer: Self-pay | Admitting: Obstetrics & Gynecology

## 2024-01-15 ENCOUNTER — Ambulatory Visit (HOSPITAL_BASED_OUTPATIENT_CLINIC_OR_DEPARTMENT_OTHER): Admitting: Obstetrics & Gynecology

## 2024-01-15 ENCOUNTER — Other Ambulatory Visit: Payer: Self-pay | Admitting: Acute Care

## 2024-01-15 ENCOUNTER — Other Ambulatory Visit (HOSPITAL_COMMUNITY)
Admission: RE | Admit: 2024-01-15 | Discharge: 2024-01-15 | Disposition: A | Discharge status: Home / Self Care | Source: Ambulatory Visit | Attending: Obstetrics & Gynecology | Admitting: Obstetrics & Gynecology

## 2024-01-15 VITALS — BP 113/76 | HR 71 | Ht 65.0 in | Wt 105.6 lb

## 2024-01-15 DIAGNOSIS — Z87440 Personal history of urinary (tract) infections: Secondary | ICD-10-CM

## 2024-01-15 DIAGNOSIS — N952 Postmenopausal atrophic vaginitis: Secondary | ICD-10-CM | POA: Diagnosis not present

## 2024-01-15 DIAGNOSIS — Z122 Encounter for screening for malignant neoplasm of respiratory organs: Secondary | ICD-10-CM

## 2024-01-15 DIAGNOSIS — Z01419 Encounter for gynecological examination (general) (routine) without abnormal findings: Secondary | ICD-10-CM | POA: Diagnosis not present

## 2024-01-15 DIAGNOSIS — N9089 Other specified noninflammatory disorders of vulva and perineum: Secondary | ICD-10-CM | POA: Insufficient documentation

## 2024-01-15 DIAGNOSIS — Z9229 Personal history of other drug therapy: Secondary | ICD-10-CM

## 2024-01-15 DIAGNOSIS — Z90710 Acquired absence of both cervix and uterus: Secondary | ICD-10-CM

## 2024-01-15 DIAGNOSIS — Z87891 Personal history of nicotine dependence: Secondary | ICD-10-CM

## 2024-01-15 MED ORDER — ESTRADIOL 1 MG PO TABS: 1.0000 mg | ORAL_TABLET | Freq: Every day | ORAL | 4 refills | Status: AC

## 2024-01-15 MED ORDER — TERCONAZOLE 0.4 % VA CREA: 1.0000 | TOPICAL_CREAM | Freq: Every day | VAGINAL | 0 refills | Status: AC

## 2024-01-15 MED ORDER — ESTRADIOL 0.1 MG/GM VA CREA: TOPICAL_CREAM | VAGINAL | 1 refills | Status: AC

## 2024-01-15 NOTE — Progress Notes (Signed)
 Breast and pelvic Exam Patient name: Emily Ward MRN [id number]  Date of birth: [birth year] Chief Complaint:   Breast and Pelvic Exam  History of Present Illness:   Jaynia Dewey is a 68 y.o. G2P2 Caucasian female being seen today for a routine annual exam.  Denies vaginal bleeding.    Does have osteoporosis and has started prolia .  She is taking a higher dosage of Vit and is also taking calcium  as well.   Patient's last menstrual period was 09/03/2000 (approximate).   Last pap 07/15/2018.   Last mammogram: 01/24/23.  Had biopsy.  Follow up scheduled in May. Results were: normal. Family h/o breast cancer: yes mother had lumpectomy but did not have to have chemotherapy Last colonoscopy: 02/11/2023 . Results were: abnormal sessile polyp and follow up 1 year.. Family h/o colorectal cancer: yes MGM and sister DEXA:  07/2023     01/15/2024   11:02 AM 05/16/2023    9:37 AM 12/11/2022    1:19 PM 11/12/2022    5:16 PM 11/12/2022    4:10 PM  Depression screen PHQ 2/9  Decreased Interest 0 1 0 0 0  Down, Depressed, Hopeless 0 1 0 0 0  PHQ - 2 Score 0 2 0 0 0  Altered sleeping  1  2 2   Tired, decreased energy  1  2 0  Change in appetite  1  0 0  Feeling bad or failure about yourself   0  0 0  Trouble concentrating  1  1 0  Moving slowly or fidgety/restless  1  0 0  Suicidal thoughts  0  0 0  PHQ-9 Score  7  5 2   Difficult doing work/chores  Not difficult at all  Somewhat difficult Somewhat difficult        05/16/2023    9:38 AM 11/12/2022    5:16 PM 11/12/2022    4:09 PM 10/03/2022    1:49 PM  GAD 7 : Generalized Anxiety Score  Nervous, Anxious, on Edge 1 0 0 0  Control/stop worrying 0 0 0 0  Worry too much - different things 0 0 0 1  Trouble relaxing 1 1 1 1   Restless 1 1 0 0  Easily annoyed or irritable 0 0 0 0  Afraid - awful might happen 0 0 0 0  Total GAD 7 Score 3 2 1 2   Anxiety Difficulty Not difficult at all Somewhat difficult Somewhat difficult Somewhat  difficult     Review of Systems:   Pertinent items are noted in HPI  Denies any bowel and bladder changes.  Has stable stress incontinence. Pertinent History Reviewed:  Reviewed past medical,surgical, social and family history.  Reviewed problem list, medications and allergies. Physical Assessment:   Vitals:   01/15/24 1053  BP: 113/76  Pulse: 71  Weight: 105 lb 9.6 oz (47.9 kg)  Height: 5\' 5"  (1.651 m)  Body mass index is 17.57 kg/m.        Physical Examination:   General appearance - well appearing, and in no distress  Mental status - alert, oriented to person, place, and time  Psych:  She has a normal mood and affect  Skin - warm and dry, normal color, no suspicious lesions noted  Chest - effort normal, all lung fields clear to auscultation bilaterally  Heart - normal rate and regular rhythm  Neck:  midline trachea, no thyromegaly or nodules  Breasts - breasts appear normal, no suspicious masses, no skin or nipple changes  or  axillary nodes  Abdomen - soft, nontender, nondistended, no masses or organomegaly  Pelvic - VULVA: normal appearing vulva with no masses, tenderness or lesions   VAGINA: normal appearing vagina with normal color and discharge, no lesions  CERVIX: surgically absent  Thin prep pap is not done   UTERUS: surgically absent  ADNEXA: No adnexal masses or tenderness noted.  Rectal - normal rectal, good sphincter tone, no masses felt.   Extremities:  No swelling or varicosities noted  Chaperone present for exam  Assessment & Plan:  1. Encntr for gyn exam (general) (routine) w/o abn findings (Primary) - Pap smear not indicated - Mammogram 07/2023, has follow up schedule 5/21 - Colonoscopy scheduled in June - Bone mineral density 07/2023 - lab work done with PCP - vaccines reviewed/updated  2. History of postmenopausal HRT - estradiol  (ESTRACE ) 1 MG tablet; Take 1 tablet (1 mg total) by mouth daily.  Dispense: 90 tablet; Refill: 4  3. Vaginal  atrophy - estradiol  (ESTRACE ) 0.1 MG/GM vaginal cream; Apply 1 gram of cream externally up to twice weekly.  Can use internally up to twice weekly if needed  Dispense: 42.5 g; Refill: 1  4. H/O: hysterectomy  5. History of recurrent UTI (urinary tract infection) - followed by Dr. Dulcy Gibney, urology  6. Vulvar irritation - Cervicovaginal ancillary only( [place]) - terconazole  (TERAZOL 7 ) 0.4 % vaginal cream; Place 1 applicator vaginally at bedtime.  Dispense: 45 g; Refill: 0   No orders of the defined types were placed in this encounter.   Meds: No orders of the defined types were placed in this encounter.   Follow-up: No follow-ups on file.  Lillian Rein, MD 01/15/2024 11:21 AM

## 2024-01-16 LAB — CERVICOVAGINAL ANCILLARY ONLY
Bacterial Vaginitis (gardnerella): NEGATIVE
Candida Glabrata: NEGATIVE
Candida Vaginitis: POSITIVE — AB
Comment: NEGATIVE
Comment: NEGATIVE
Comment: NEGATIVE

## 2024-01-22 ENCOUNTER — Ambulatory Visit
Admission: RE | Admit: 2024-01-22 | Discharge: 2024-01-22 | Disposition: A | Discharge status: Home / Self Care | Source: Ambulatory Visit | Attending: Family Medicine | Admitting: Family Medicine

## 2024-01-22 ENCOUNTER — Ambulatory Visit (HOSPITAL_BASED_OUTPATIENT_CLINIC_OR_DEPARTMENT_OTHER): Payer: Self-pay | Admitting: Obstetrics & Gynecology

## 2024-01-22 DIAGNOSIS — Z1231 Encounter for screening mammogram for malignant neoplasm of breast: Secondary | ICD-10-CM

## 2024-02-07 ENCOUNTER — Other Ambulatory Visit: Payer: Self-pay | Admitting: Pulmonary Disease

## 2024-02-10 ENCOUNTER — Telehealth: Payer: Self-pay

## 2024-02-10 NOTE — Telephone Encounter (Signed)
 Copied from CRM [id number]. Topic: Clinical - Medication Prior Auth >> Feb 10, 2024  9:14 AM Corean Deutscher wrote: Reason for CRM: Patient called stating her pharmacy denied her medication TRELEGY ELLIPTA  100-62.5-25 MCG/ACT AEPB and informed her it was not approved and she needed to follow up with the office regarding the medication.  ATC x1 LVM on patient 's phone to let patient know she will need to make a f/u with our office to get  further refill's on medication's .

## 2024-02-10 NOTE — Telephone Encounter (Signed)
 Copied from CRM [id number]. Topic: Clinical - Medication Refill >> Feb 10, 2024  4:03 PM Juliana Ocean wrote: Medication: TRELEGY ELLIPTA  100-62.5-25 MCG/ACT AEPB  Has the patient contacted their pharmacy? Yes (Agent: If no, request that the patient contact the pharmacy for the refill. If patient does not wish to contact the pharmacy document the reason why and proceed with request.) (Agent: If yes, when and what did the pharmacy advise?)  This is the patient's preferred pharmacy:  [id number] Jonette Nestle, [place] - [address] AT [facility] [address] [place] [address] Phone: [phone] Fax: [phone]   Is this the correct pharmacy for this prescription? Yes If no, delete pharmacy and type the correct one.   Has the prescription been filled recently? Yes  Is the patient out of the medication? Yes  Has the patient been seen for an appointment in the last year OR does the patient have an upcoming appointment? Yes  Can we respond through MyChart? Yes  Pt advised to make appt before refill, but no appt to schedule pt.  She is going to call back in a couple of weeks, but needs her trelegy refilled now.

## 2024-02-10 NOTE — Telephone Encounter (Signed)
 Copied from CRM [id number]. Topic: Clinical - Lab/Test Results >> Feb 10, 2024  9:20 AM Corean Deutscher wrote: Reason for CRM: Patient calling regarding her CT results, patient stated she had the CT scan done in April and still has not heard back regarding her results.  ATC x1 LVM for patient to call our office back regarding prior message .

## 2024-02-11 ENCOUNTER — Telehealth: Payer: Self-pay | Admitting: Acute Care

## 2024-02-11 MED ORDER — TRELEGY ELLIPTA 100-62.5-25 MCG/ACT IN AEPB: 1.0000 | INHALATION_SPRAY | Freq: Every day | RESPIRATORY_TRACT | 0 refills | Status: DC

## 2024-02-11 NOTE — Telephone Encounter (Signed)
 Patient had a lung cancer screening chest CT on 12/16/2023.  Sent message to Dara Ear, NP. Re: lung cancer screening ordering provider for review.  Sarah, please review CT per patients request.  Thank you.

## 2024-02-11 NOTE — Telephone Encounter (Signed)
 Per Tyra Galley, RN that works with Dara Ear, NP on Lung Cancer Screening will call patient to review LDCT.  Called patient.  Dr. Waylan Haggard does not have any openings through the end of June 2025.  Patient is out of Trelegy 100 mg. Will send in one refill and patient will try to call next week to check for any cancellations.  Send rx to [facility] at [employer] and [address]. Sent in rx.

## 2024-02-11 NOTE — Telephone Encounter (Signed)
 Called to review results of LDCT.  Identified patient by name and DOB.  Results had been sent to mychart and patient had not reviewed them. No suspicious findings for lung cancer.  Emphysema noted.  Recommend annual follow up for LDCT.  Patient had no questions and acknowledged understanding of review.

## 2024-02-14 ENCOUNTER — Other Ambulatory Visit: Payer: Self-pay | Admitting: Pharmacist

## 2024-02-14 NOTE — Progress Notes (Signed)
 Patient would like to stay on Prolia / Discontinuing Evenity treatment plan  Diagnosis: age-related osteoporosis  Dose: 60 mg SQ every 6 months  Last Clinic Visit: 12/31/23 Next Clinic Visit: 07/01/24  Last Prolia  dose: 08/16/23  Labs: BMP on 01/06/24 Last DEXA: 07/24/2023 Next DEXA due: Nov 2026  Orders placed for Prolia  at [facility]. No premedicatons required.   Geraldene Kleine, PharmD, MPH, BCPS, CPP Clinical Pharmacist (Rheumatology and Pulmonology)

## 2024-02-17 ENCOUNTER — Ambulatory Visit (INDEPENDENT_AMBULATORY_CARE_PROVIDER_SITE_OTHER): Payer: Self-pay

## 2024-02-17 VITALS — BP 122/67 | HR 74 | Temp 98.0°F | Resp 18 | Ht 65.0 in | Wt 107.6 lb

## 2024-02-17 DIAGNOSIS — M810 Age-related osteoporosis without current pathological fracture: Secondary | ICD-10-CM | POA: Diagnosis not present

## 2024-02-17 MED ORDER — DENOSUMAB 60 MG/ML ~~LOC~~ SOSY
60.0000 mg | PREFILLED_SYRINGE | Freq: Once | SUBCUTANEOUS | Status: AC
  Administered 2024-02-17: 60 mg via SUBCUTANEOUS
  Filled 2024-02-17: qty 1

## 2024-02-17 NOTE — Progress Notes (Signed)
 Diagnosis: Osteoporosis  Provider:  Mannam, Praveen MD  Procedure: Injection  Prolia  (Denosumab ), Dose: 60 mg, Site: subcutaneous, Number of injections: 1  Injection Site(s): Right arm  Post Care:     Discharge: Condition: Good, Destination: Home . AVS Declined  Performed by:  Cashius Grandstaff, RN

## 2024-02-20 ENCOUNTER — Encounter: Payer: Self-pay | Admitting: Rheumatology

## 2024-02-25 ENCOUNTER — Ambulatory Visit (AMBULATORY_SURGERY_CENTER)

## 2024-02-25 ENCOUNTER — Encounter: Payer: Self-pay | Admitting: Internal Medicine

## 2024-02-25 VITALS — Ht 65.0 in | Wt 108.0 lb

## 2024-02-25 DIAGNOSIS — Z860100 Personal history of colon polyps, unspecified: Secondary | ICD-10-CM

## 2024-02-25 MED ORDER — NA SULFATE-K SULFATE-MG SULF 17.5-3.13-1.6 GM/177ML PO SOLN: 1.0000 | Freq: Once | ORAL | 0 refills | Status: AC

## 2024-02-25 NOTE — Progress Notes (Signed)
 No egg or soy allergy known to patient  No issues known to pt with past sedation with any surgeries or procedures Patient denies ever being told they had issues or difficulty with intubation  No FH of Malignant Hyperthermia Pt is not on diet pills nor GLP-1 medication Pt is not on home 02  Pt is not on blood thinners  Pt denies issues with chronic constipation  No A fib or A flutter Have any cardiac testing pending--no Pt instructed to use Singlecare.[email or web address] or GoodRx for a price reduction on prep  Ambulates independently Emily Schillings, CRNA has reviewed patient chart

## 2024-03-05 ENCOUNTER — Ambulatory Visit: Admitting: Internal Medicine

## 2024-03-05 ENCOUNTER — Encounter: Payer: Self-pay | Admitting: Internal Medicine

## 2024-03-05 VITALS — BP 113/68 | HR 66 | Temp 97.6°F | Resp 11 | Ht 65.0 in | Wt 108.0 lb

## 2024-03-05 DIAGNOSIS — Z860101 Personal history of adenomatous and serrated colon polyps: Secondary | ICD-10-CM

## 2024-03-05 DIAGNOSIS — D124 Benign neoplasm of descending colon: Secondary | ICD-10-CM

## 2024-03-05 DIAGNOSIS — D120 Benign neoplasm of cecum: Secondary | ICD-10-CM

## 2024-03-05 DIAGNOSIS — K621 Rectal polyp: Secondary | ICD-10-CM

## 2024-03-05 DIAGNOSIS — Z1211 Encounter for screening for malignant neoplasm of colon: Secondary | ICD-10-CM | POA: Diagnosis not present

## 2024-03-05 DIAGNOSIS — K648 Other hemorrhoids: Secondary | ICD-10-CM

## 2024-03-05 DIAGNOSIS — Z860100 Personal history of colon polyps, unspecified: Secondary | ICD-10-CM

## 2024-03-05 DIAGNOSIS — Z800 Family history of malignant neoplasm of digestive organs: Secondary | ICD-10-CM

## 2024-03-05 DIAGNOSIS — K635 Polyp of colon: Secondary | ICD-10-CM | POA: Diagnosis not present

## 2024-03-05 DIAGNOSIS — D128 Benign neoplasm of rectum: Secondary | ICD-10-CM

## 2024-03-05 MED ORDER — SODIUM CHLORIDE 0.9 % IV SOLN: 500.0000 mL | Freq: Once | INTRAVENOUS | Status: DC

## 2024-03-05 NOTE — Progress Notes (Signed)
 Called to room to assist during endoscopic procedure.  Patient ID and intended procedure confirmed with present staff. Received instructions for my participation in the procedure from the performing physician.

## 2024-03-05 NOTE — Op Note (Signed)
 [facility] Patient Name: Emily Ward Procedure Date: 03/05/2024 1:19 PM MRN: [id number] Endoscopist: Rosario Estefana Kidney , , [id number] Age: 68 Referring MD:  Date of Birth: 03-08-1956 Gender: Female Account #: [id number] Procedure:                Colonoscopy Indications:              Screening patient at increased risk: Family history                            of 1st-degree relative with colorectal cancer at                            age 52 years (or older), High risk colon cancer                            surveillance: Personal history of colonic polyps Medicines:                Monitored Anesthesia Care Procedure:                Pre-Anesthesia Assessment:                           - Prior to the procedure, a History and Physical                            was performed, and patient medications and                            allergies were reviewed. The patient's tolerance of                            previous anesthesia was also reviewed. The risks                            and benefits of the procedure and the sedation                            options and risks were discussed with the patient.                            All questions were answered, and informed consent                            was obtained. Prior Anticoagulants: The patient has                            taken no anticoagulant or antiplatelet agents. ASA                            Grade Assessment: II - A patient with mild systemic                            disease. After reviewing the risks and benefits,  the patient was deemed in satisfactory condition to                            undergo the procedure.                           After obtaining informed consent, the colonoscope                            was passed under direct vision. Throughout the                            procedure, the patient's blood pressure, pulse, and                            oxygen  saturations were monitored continuously. The                            Olympus Scope SN: [id number] was introduced through                            the anus and advanced to the the terminal ileum.                            The colonoscopy was performed without difficulty.                            The patient tolerated the procedure well. The                            quality of the bowel preparation was excellent. The                            terminal ileum, ileocecal valve, appendiceal                            orifice, and rectum were photographed. Scope In: 1:25:16 PM Scope Out: 2:02:50 PM Scope Withdrawal Time: 0 hours 30 minutes 49 seconds  Total Procedure Duration: 0 hours 37 minutes 34 seconds  Findings:                 The terminal ileum appeared normal.                           A 12 mm polyp was found in the cecum. The polyp was                            sessile. The polyp was removed with a cold snare.                            Resection and retrieval were complete.                           Three sessile polyps were found in the rectum and  descending colon. The polyps were 3 to 5 mm in                            size. These polyps were removed with a cold snare.                            Resection and retrieval were complete.                           Non-bleeding internal hemorrhoids were found during                            retroflexion. Complications:            No immediate complications. Estimated Blood Loss:     Estimated blood loss was minimal. Impression:               - The examined portion of the ileum was normal.                           - One 12 mm polyp in the cecum, removed with a cold                            snare. Resected and retrieved.                           - Three 3 to 5 mm polyps in the rectum and in the                            descending colon, removed with a cold snare.                             Resected and retrieved.                           - Non-bleeding internal hemorrhoids. Recommendation:           - Discharge patient to home (with escort).                           - Await pathology results.                           - The findings and recommendations were discussed                            with the patient. Dr Estefana Federico Rosario Estefana Federico,  03/05/2024 2:06:58 PM

## 2024-03-05 NOTE — Progress Notes (Signed)
 GASTROENTEROLOGY PROCEDURE H&P NOTE   Primary Care Physician: Johnny Garnette LABOR, MD    Reason for Procedure:   History of colon polyps, family history of rectal cancer  Plan:    Colonoscopy  Patient is appropriate for endoscopic procedure(s) in the ambulatory ([facility]) setting.  The nature of the procedure, as well as the risks, benefits, and alternatives were carefully and thoroughly reviewed with the patient. Ample time for discussion and questions allowed. The patient understood, was satisfied, and agreed to proceed.     HPI: Emily Ward is a 68 y.o. female who presents for colonoscopy for history of colon polyps, family history of rectal cancer. Denies blood in stools, changes in bowel habits, or unintentional weight loss. Grand mother had colon cancer and sister had rectal cancer (she passed away recently but did not receive any treatment). Sister was diagnosed in her 54s.  Colonoscopy 07/19/22: - One 20 mm polyp in the cecum, removed piecemeal using a cold snare. Resected and retrieved. - Diverticulosis in the sigmoid colon. - Non- bleeding internal hemorrhoids. Path: Surgical [P], colon, cecum polyp x 1, polyp (1) - TUBULAR ADENOMA, FRAGMENTS.  Colonoscopy 02/11/23: - Two 4 to 6 mm polyps in the transverse colon and in the cecum, removed with a cold snare. Resected and retrieved. - Diverticulosis in the sigmoid colon. - Non- bleeding internal hemorrhoids. Path: Surgical [P], colon, cecum, transverse, polyp (2) - TUBULAR ADENOMA, FRAGMENTS.   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     Fibromyalgia    sees Dr. 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 Rosario Kidney, adenmatous polyps, repeat in one year   CYSTOSCOPY N/A 09/01/2018   Procedure: CYSTOSCOPY;  Surgeon: Cleotilde Ronal RAMAN, MD;  Location: [facility];  Service: Gynecology;  Laterality: N/A;   DILATATION & CURETTAGE/HYSTEROSCOPY WITH MYOSURE N/A 04/12/2017   Procedure: DILATATION & CURETTAGE/HYSTEROSCOPY;  Surgeon: Cleotilde Ronal RAMAN, MD;  Location: [facility];  Service: Gynecology;  Laterality: N/A;   epidural thoracic spine injection  06/07/2022   facet joint block  03/30/2021   per patient, performed by Dr. Eldonna   facet joint block  01/31/2022   per patient, performed by Dr. Eldonna   HYSTEROSCOPY     years ago with Dr Leva-? 2000   KNEE ARTHROSCOPY Left 10/24/2022   TOTAL LAPAROSCOPIC HYSTERECTOMY WITH SALPINGECTOMY Bilateral 09/01/2018   Procedure: TOTAL LAPAROSCOPIC HYSTERECTOMY WITH SALPINGECTOMY  BSO, REPAIR OF PERINEAL LACERATION;  Surgeon: Cleotilde Ronal RAMAN,  MD;  Location: [facility];  Service: Gynecology;  Laterality: Bilateral;  possible BSO   URETHRAL DILATION      Prior to Admission medications   Medication Sig Start Date End Date Taking? Authorizing Provider  atorvastatin  (LIPITOR) 10 MG tablet TAKE 1 TABLET(10 MG) BY MOUTH DAILY 10/29/23   Johnny Garnette LABOR, MD  butalbital -acetaminophen -caffeine  (FIORICET ) 50-325-40 MG tablet TAKE 1 TABLET BY MOUTH EVERY 6 HOURS AS NEEDED 09/25/23   Johnny Garnette LABOR, MD  Calcium -Magnesium-Zinc (CAL-MAG-ZINC  PO) Take by mouth daily.    [provider]  cetirizine (ZYRTEC) 10 MG tablet Take 10 mg by mouth daily as needed for allergies (spring time).    [provider]  chlorzoxazone  (PARAFON ) 500 MG tablet TAKE 1 TABLET BY MOUTH FOUR TIMES DAILY AS NEEDED FOR MUSCLE SPASMS 01/02/24   Johnny Garnette LABOR, MD  Cholecalciferol (VITAMIN D3) 125 MCG (5000 UT) CAPS Take 5,000 Units by mouth daily.    [provider]  CRANBERRY PO Take by mouth daily.    [provider]  cyclobenzaprine  (FLEXERIL ) 10 MG tablet TAKE 1 TABLET BY MOUTH THREE TIMES DAILY AS NEEDED FOR MUSCLE SPASMS 01/02/24   Johnny Garnette LABOR, MD  denosumab  (PROLIA ) 60 MG/ML SOSY injection Inject 60 mg into the skin every 6 (six) months.    [provider]  escitalopram (LEXAPRO) 10 MG tablet Take by mouth. 07/01/23   [provider]  estradiol  (ESTRACE ) 0.1 MG/GM vaginal cream Apply 1 gram of cream externally up to twice weekly.  Can use internally up to twice weekly if needed Patient not taking: Reported on 02/25/2024 01/15/24   Cleotilde Ronal RAMAN, MD  estradiol  (ESTRACE ) 1 MG tablet Take 1 tablet (1 mg total) by mouth daily. 01/15/24   Cleotilde Ronal RAMAN, MD  Fluticasone-Umeclidin-Vilant (TRELEGY ELLIPTA ) 100-62.5-25 MCG/ACT AEPB Inhale 1 puff into the lungs daily. 02/11/24   Mannam, Praveen, MD  L-Theanine 200 MG CAPS  06/23/23   [provider]  Multiple Vitamin (MULTIVITAMIN ADULT PO)     [provider]  Omega-3 Fatty Acids (OMEGA 3 PO) Take by mouth daily.    [provider]  terconazole  (TERAZOL 7 ) 0.4 % vaginal cream Place 1 applicator vaginally at bedtime. Patient taking differently: Place 1 applicator vaginally daily as needed. 01/15/24   Cleotilde Ronal RAMAN, MD  zolmitriptan  (ZOMIG ) 5 MG tablet TAKE 1 TABLET BY MOUTH AS NEEDED AS DIRECTED Patient taking differently: Take 5 mg by mouth as needed for migraine. 06/21/23   Johnny Garnette LABOR, MD  zolpidem  (AMBIEN ) 10 MG tablet TAKE 1  TABLET(10 MG) BY MOUTH AT BEDTIME Patient taking differently: Take 10 mg by mouth at bedtime as needed for sleep. 11/11/23   Johnny Garnette LABOR, MD    Current Outpatient Medications  Medication Sig Dispense Refill   atorvastatin  (LIPITOR) 10 MG tablet TAKE 1 TABLET(10 MG) BY MOUTH DAILY 90 tablet 3   butalbital -acetaminophen -caffeine  (FIORICET ) 50-325-40 MG tablet TAKE 1 TABLET BY MOUTH EVERY 6 HOURS AS NEEDED 120 tablet 5   Calcium -Magnesium-Zinc (CAL-MAG-ZINC PO) Take by mouth daily.     cetirizine (ZYRTEC) 10 MG tablet Take 10 mg by mouth daily as needed for allergies (spring time).     chlorzoxazone  (PARAFON ) 500 MG tablet TAKE 1 TABLET BY MOUTH FOUR TIMES DAILY AS NEEDED FOR MUSCLE SPASMS 120 tablet 5   Cholecalciferol (VITAMIN D3) 125 MCG (5000 UT) CAPS Take 5,000 Units by mouth daily.     CRANBERRY PO Take by mouth daily.  cyclobenzaprine  (FLEXERIL ) 10 MG tablet TAKE 1 TABLET BY MOUTH THREE TIMES DAILY AS NEEDED FOR MUSCLE SPASMS 270 tablet 1   denosumab  (PROLIA ) 60 MG/ML SOSY injection Inject 60 mg into the skin every 6 (six) months.     escitalopram (LEXAPRO) 10 MG tablet Take by mouth.     estradiol  (ESTRACE ) 0.1 MG/GM vaginal cream Apply 1 gram of cream externally up to twice weekly.  Can use internally up to twice weekly if needed (Patient not taking: Reported on 02/25/2024) 42.5 g 1   estradiol  (ESTRACE ) 1 MG tablet Take 1 tablet (1 mg total) by mouth daily. 90 tablet 4   Fluticasone-Umeclidin-Vilant (TRELEGY ELLIPTA ) 100-62.5-25 MCG/ACT AEPB Inhale 1 puff into the lungs daily. 30 each 0   L-Theanine 200 MG CAPS      Multiple Vitamin (MULTIVITAMIN ADULT PO)      Omega-3 Fatty Acids (OMEGA 3 PO) Take by mouth daily.     terconazole  (TERAZOL 7 ) 0.4 % vaginal cream Place 1 applicator vaginally at bedtime. (Patient taking differently: Place 1 applicator vaginally daily as needed.) 45 g 0   zolmitriptan  (ZOMIG ) 5 MG tablet TAKE 1 TABLET BY MOUTH AS NEEDED AS DIRECTED (Patient taking  differently: Take 5 mg by mouth as needed for migraine.) 30 tablet 1   zolpidem  (AMBIEN ) 10 MG tablet TAKE 1 TABLET(10 MG) BY MOUTH AT BEDTIME (Patient taking differently: Take 10 mg by mouth at bedtime as needed for sleep.) 30 tablet 5   Current Facility-Administered Medications  Medication Dose Route Frequency Provider Last Rate Last Admin   0.9 %  sodium chloride  infusion  500 mL Intravenous Once Federico Rosario BROCKS, MD        Allergies as of 03/05/2024 - Review Complete 03/05/2024  Allergen Reaction Noted   Divalproex sodium  03/28/2017   Zoloft [sertraline] Other (See Comments) 07/03/2023   Nsaids Other (See Comments) 05/09/2007    Family History  Problem Relation Age of Onset   Fibrocystic breast disease Mother        lumpectomy in 50-60   Heart attack Father    Congestive Heart Failure Father    Rectal cancer Sister    Diabetes type II Sister        and ? brother   Colon cancer Maternal Grandmother    Arthritis Son        psoriatic   Cancer Other        breast/fhx   Heart disease Other        fhx   Esophageal cancer Neg Hx    Stomach cancer Neg Hx    Colon polyps Neg Hx     Social History   Socioeconomic History   Marital status: Married    Spouse name: Not on file   Number of children: Not on file   Years of education: Not on file   Highest education level: Associate degree: academic program  Occupational History    Employer: [employer]  Tobacco Use   Smoking status: Former    Current packs/day: 0.00    Average packs/day: 1 pack/day for 20.0 years (20.0 ttl pk-yrs)    Types: Cigarettes    Start date: 04/02/1993    Quit date: 04/02/2013    Years since quitting: 10.9    Passive exposure: Never   Smokeless tobacco: Never  Vaping Use   Vaping status: Never Used  Substance and Sexual Activity   Alcohol use: Yes    Alcohol/week: 1.0 standard drink of alcohol  Types: 1 Glasses of wine per week    Comment: occ   Drug use: Never   Sexual activity: Not  Currently    Partners: Male    Birth control/protection: Surgical  Other Topics Concern   Not on file  Social History Narrative   Not on file   Social Drivers of Health   Financial Resource Strain: Low Risk  (12/09/2023)   Overall Financial Resource Strain (CARDIA)    Difficulty of Paying Living Expenses: Not very hard  Food Insecurity: No Food Insecurity (12/09/2023)   Hunger Vital Sign    Worried About Running Out of Food in the Last Year: Never true    Ran Out of Food in the Last Year: Never true  Transportation Needs: No Transportation Needs (12/09/2023)   PRAPARE - Administrator, Civil Service (Medical): No    Lack of Transportation (Non-Medical): No  Physical Activity: Insufficiently Active (12/09/2023)   Exercise Vital Sign    Days of Exercise per Week: 3 days    Minutes of Exercise per Session: 20 min  Stress: No Stress Concern Present (12/09/2023)   [employer] of Occupational Health - Occupational Stress Questionnaire    Feeling of Stress : Only a little  Social Connections: Moderately Isolated (12/09/2023)   Social Connection and Isolation Panel    Frequency of Communication with Friends and Family: More than three times a week    Frequency of Social Gatherings with Friends and Family: Three times a week    Attends Religious Services: Never    Active Member of Clubs or Organizations: No    Attends Banker Meetings: Not on file    Marital Status: Married  Intimate Partner Violence: Unknown (04/12/2022)   Received from [facility]   HITS    Physically Hurt: Not on file    Insult or Talk Down To: Not on file    Threaten Physical Harm: Not on file    Scream or Curse: Not on file    Physical Exam: Vital signs in last 24 hours: BP 115/71   Pulse 78   Temp 97.6 F (36.4 C) (Skin)   Ht 5' 5 (1.651 m)   Wt 108 lb (49 kg)   LMP 09/03/2000 (Approximate)   SpO2 98%   BMI 17.97 kg/m  GEN: NAD EYE: Sclerae anicteric ENT: MMM CV:  Non-tachycardic Pulm: No increased work of breathing GI: Soft, NT/ND NEURO:  Alert & Oriented   Estefana Kidney, MD [facility]  03/05/2024 12:53 PM

## 2024-03-05 NOTE — Progress Notes (Signed)
 Sedate, gd SR, tolerated procedure well, VSS, report to RN

## 2024-03-05 NOTE — Patient Instructions (Signed)
 Handout provided on colon polyps   YOU HAD AN ENDOSCOPIC PROCEDURE TODAY AT THE [facility]:   Refer to the procedure report that was given to you for any specific questions about what was found during the examination.  If the procedure report does not answer your questions, please call your gastroenterologist to clarify.  If you requested that your care partner not be given the details of your procedure findings, then the procedure report has been included in a sealed envelope for you to review at your convenience later.  YOU SHOULD EXPECT: Some feelings of bloating in the abdomen. Passage of more gas than usual.  Walking can help get rid of the air that was put into your GI tract during the procedure and reduce the bloating. If you had a lower endoscopy (such as a colonoscopy or flexible sigmoidoscopy) you may notice spotting of blood in your stool or on the toilet paper. If you underwent a bowel prep for your procedure, you may not have a normal bowel movement for a few days.  Please Note:  You might notice some irritation and congestion in your nose or some drainage.  This is from the oxygen used during your procedure.  There is no need for concern and it should clear up in a day or so.  SYMPTOMS TO REPORT IMMEDIATELY:  Following lower endoscopy (colonoscopy or flexible sigmoidoscopy):  Excessive amounts of blood in the stool  Significant tenderness or worsening of abdominal pains  Swelling of the abdomen that is new, acute  Fever of 100F or higher  For urgent or emergent issues, a gastroenterologist can be reached at any hour by calling [phone]. Do not use MyChart messaging for urgent concerns.    DIET:  We do recommend a small meal at first, but then you may proceed to your regular diet.  Drink plenty of fluids but you should avoid alcoholic beverages for 24 hours.  ACTIVITY:  You should plan to take it easy for the rest of today and you should NOT DRIVE or use heavy  machinery until tomorrow (because of the sedation medicines used during the test).    FOLLOW UP: Our staff will call the number listed on your records the next business day following your procedure.  We will call around 7:15- 8:00 am to check on you and address any questions or concerns that you may have regarding the information given to you following your procedure. If we do not reach you, we will leave a message.     If any biopsies were taken you will be contacted by phone or by letter within the next 1-3 weeks.  Please call us  at [phone] if you have not heard about the biopsies in 3 weeks.    SIGNATURES/CONFIDENTIALITY: You and/or your care partner have signed paperwork which will be entered into your electronic medical record.  These signatures attest to the fact that that the information above on your After Visit Summary has been reviewed and is understood.  Full responsibility of the confidentiality of this discharge information lies with you and/or your care-partner.

## 2024-03-05 NOTE — Progress Notes (Signed)
 Pt's states no medical or surgical changes since previsit or office visit.

## 2024-03-08 ENCOUNTER — Other Ambulatory Visit: Payer: Self-pay | Admitting: Pulmonary Disease

## 2024-03-09 ENCOUNTER — Telehealth: Payer: Self-pay | Admitting: Lactation Services

## 2024-03-09 NOTE — Telephone Encounter (Signed)
 No answer left voice mail

## 2024-03-11 ENCOUNTER — Ambulatory Visit: Payer: Self-pay | Admitting: Internal Medicine

## 2024-03-11 LAB — SURGICAL PATHOLOGY

## 2024-04-17 ENCOUNTER — Other Ambulatory Visit: Payer: Self-pay | Admitting: Family Medicine

## 2024-05-05 ENCOUNTER — Encounter: Payer: Self-pay | Admitting: Pulmonary Disease

## 2024-05-05 ENCOUNTER — Ambulatory Visit (INDEPENDENT_AMBULATORY_CARE_PROVIDER_SITE_OTHER): Admitting: Pulmonary Disease

## 2024-05-05 VITALS — BP 108/72 | HR 89 | Temp 98.7°F | Ht 65.0 in | Wt 107.4 lb

## 2024-05-05 DIAGNOSIS — J439 Emphysema, unspecified: Secondary | ICD-10-CM | POA: Diagnosis not present

## 2024-05-05 DIAGNOSIS — Z122 Encounter for screening for malignant neoplasm of respiratory organs: Secondary | ICD-10-CM | POA: Diagnosis not present

## 2024-05-05 DIAGNOSIS — J4489 Other specified chronic obstructive pulmonary disease: Secondary | ICD-10-CM

## 2024-05-05 MED ORDER — ALBUTEROL SULFATE HFA 108 (90 BASE) MCG/ACT IN AERS: 2.0000 | INHALATION_SPRAY | Freq: Four times a day (QID) | RESPIRATORY_TRACT | 3 refills | Status: AC | PRN

## 2024-05-05 NOTE — Patient Instructions (Addendum)
  VISIT SUMMARY: Today, you came in for a follow-up visit to discuss your respiratory condition, particularly your COPD and related symptoms. We reviewed your current treatment plan, recent imaging results, and your exercise routine.  YOUR PLAN: -CHRONIC OBSTRUCTIVE PULMONARY DISEASE (COPD) WITH EMPHYSEMA: COPD is a chronic lung condition that makes it hard to breathe. Emphysema is a type of COPD that involves damage to the air sacs in the lungs. You are currently managing well with minimal use of your albuterol  inhaler and find Trelegy effective. We will refill your albuterol  inhaler prescription for use as needed, especially before exercise. Continue using Trelegy and keep up with your exercise routine and personal training sessions to improve your conditioning.  -STABLE PULMONARY NODULES: Pulmonary nodules are small growths in the lungs. Your recent CT scan shows that these nodules are stable, meaning they have not changed in size or appearance. No changes to your current management are needed.  -ALLERGIC RHINITIS: Allergic rhinitis is an allergic reaction that causes sneezing, congestion, and a runny nose. It can also contribute to shortness of breath, especially during allergy seasons. Your symptoms are manageable and do not prevent you from engaging in activities.  -DECONDITIONING: Deconditioning refers to the loss of muscle strength and endurance due to inactivity or illness. You are actively working with a Systems analyst to improve your strength and conditioning, and you have reported improvements. Continue your current exercise and training regimen.  INSTRUCTIONS: Refill your albuterol  inhaler prescription and use it as needed, especially before exercise. Continue using Trelegy for COPD management. Keep up with your regular exercise routine and personal training sessions to improve your conditioning. Follow up with us  if you experience any changes in your symptoms or have any  concerns.

## 2024-05-05 NOTE — Progress Notes (Signed)
 Emily Ward    [id number]    1955-12-11  Primary Care Physician:Fry, Garnette LABOR, MD  Referring Physician: Johnny Garnette LABOR, MD [address] [place],  [place] [address]  Chief complaint: Follow-up for severe COPD  HPI: 68 year old with history of mitral valve prolapse, allergies, chronic headaches Here for follow-up of severe COPD Initially evaluated in December 2019.  Started on Anoro inhaler at that time Finished pulmonary rehab in 2020  Underwent total hysterectomy with salpingectomy on 09/01/2018 under general anesthesia without any respiratory issues. Contracted COVID-19 in November 2020.  Symptoms are mostly fever, joint pain, headache and fatigue.  She did not have any respiratory symptoms except for throat irritation.  Did not need isolation Diagnosed with COVID again in early 2022.  She did not require hospitalization and has recovered back to baseline  Initially on Anoro.  Switch to Trelegy in 2024 with good response.  Interim history: Discussed the use of AI scribe software for clinical note transcription with the patient, who gave verbal consent to proceed.  History of Present Illness Emily Ward is a 68 year old female with COPD who presents for follow-up of her respiratory condition.  Dyspnea and respiratory symptoms - Mild shortness of breath over the past few days, attributed to possible allergies - Increased shortness of breath during allergy seasons, particularly in fall and spring - No significant impact on daily activities from dyspnea - Frequent need to clear throat - Difficulty with inclines during walking and exercise - No use of pulse oximeter; manages dyspnea by pausing to catch breath  Chronic obstructive pulmonary disease (copd) management - Currently using Trelegy, which is more effective than previous regimen with Anoro - Rare use of nebulizer; prefers inhaler for rescue therapy - Needs refill for  inhaler; last prescribed in 2023, indicating infrequent use  Pulmonary imaging and findings - Last chest CT in April showed stable findings - Findings include tiny lung nodules and emphysema  Exercise tolerance and physical activity - Engages in regular exercise, including walking with husband almost every night - Difficulty with inclines during walking - Participates in strength and muscle building with personal trainer, including treadmill and bike exercises - Working with trainer to address muscle weakness following neck issues and knee surgery in 2023    Relevant pulmonary history Pets: Cat, no dogs, birds, farm animals 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 Smoking history: 35-pack-year smoker.  Quit in 2014 Travel history: No significant travel history Relevant family history: Mom had emphysema monte was a smoker)  Outpatient Encounter Medications as of 05/05/2024  Medication Sig   atorvastatin  (LIPITOR) 10 MG tablet TAKE 1 TABLET(10 MG) BY MOUTH DAILY   butalbital -acetaminophen -caffeine  (FIORICET ) 50-325-40 MG tablet TAKE 1 TABLET BY MOUTH EVERY 6 HOURS AS NEEDED   Calcium -Magnesium-Zinc (CAL-MAG-ZINC PO) Take by mouth daily.   cetirizine (ZYRTEC) 10 MG tablet Take 10 mg by mouth daily as needed for allergies (spring time).   chlorzoxazone  (PARAFON ) 500 MG tablet TAKE 1 TABLET BY MOUTH FOUR TIMES DAILY AS NEEDED FOR MUSCLE SPASMS   Cholecalciferol (VITAMIN D3) 125 MCG (5000 UT) CAPS Take 5,000 Units by mouth daily.   CRANBERRY PO Take by mouth daily.   cyclobenzaprine  (FLEXERIL ) 10 MG tablet TAKE 1 TABLET BY MOUTH THREE TIMES DAILY AS NEEDED FOR MUSCLE SPASMS   denosumab  (PROLIA ) 60 MG/ML SOSY injection Inject 60 mg  into the skin every 6 (six) months.   escitalopram (LEXAPRO) 10 MG tablet Take by mouth.   estradiol  (ESTRACE ) 1 MG tablet Take 1 tablet (1 mg total) by mouth daily.    Fluticasone-Umeclidin-Vilant (TRELEGY ELLIPTA ) 100-62.5-25 MCG/ACT AEPB INHALE 1 PUFF INTO THE LUNGS DAILY   L-Theanine 200 MG CAPS    Multiple Vitamin (MULTIVITAMIN ADULT PO)    Omega-3 Fatty Acids (OMEGA 3 PO) Take by mouth daily.   zolmitriptan  (ZOMIG ) 5 MG tablet TAKE 1 TABLET BY MOUTH AS NEEDED AS DIRECTED   zolpidem  (AMBIEN ) 10 MG tablet TAKE 1 TABLET(10 MG) BY MOUTH AT BEDTIME (Patient taking differently: Take 10 mg by mouth at bedtime as needed for sleep.)   estradiol  (ESTRACE ) 0.1 MG/GM vaginal cream Apply 1 gram of cream externally up to twice weekly.  Can use internally up to twice weekly if needed (Patient not taking: Reported on 05/05/2024)   terconazole  (TERAZOL 7 ) 0.4 % vaginal cream Place 1 applicator vaginally at bedtime. (Patient not taking: Reported on 05/05/2024)   No facility-administered encounter medications on file as of 05/05/2024.   Vitals:   05/05/24 1412  BP: 108/72  Pulse: 89  Temp: 98.7 F (37.1 C)  Height: 5' 5 (1.651 m)  Weight: 107 lb 6.4 oz (48.7 kg)  SpO2: 94%  TempSrc: Temporal  BMI (Calculated): 17.87     Physical Exam GEN: No acute distress CV: Regular rate and rhythm no murmurs LUNGS: Clear to auscultation bilaterally normal respiratory effort SKIN JOINTS: Warm and dry no rash    Data Reviewed: Imaging: Screening CT 04/27/2019-right lower lobe pulmonary nodule, emphysema, linear scarring in the lower lobes.  CT chest 06/13/21- Moderate emphysema, stable lung nodules  CT chest 11/13/2022-pulmonary nodule adjacent to the pleural surface in the right upper lobe, small irregular pulmonary nodule in the right upper lobe which are stable.  Moderate emphysema, small stable pericardial effusion.  Screening CT/14/2025-centrilobular and paraseptal emphysema, stable pulmonary nodules. I have reviewed the images personally.  PFTs: 03/05/2019 FVC 2.22 [64%), FEV1 1.43 (53%),F/F 64, TLC 6.78 [130%], DLCO 9.79 [46%] Severe obstruction with air trapping and  hyperinflation Severe diffusion defect  Cardiac: Echo 08/21/17- LVEF 60-65%, normal wall thickness, normal wall motion, normal diastolic function, normal LA size ,trivial MR, normal RV size with low normal RV systolic function, normal IVC.  Labs: CBC 10/22/2018-WBC 6.5, eos 1%, absolute eosinophil count 65 Alpha-1 antitrypsin 08/15/2018-126, PI MM  Assessment & Plan Chronic obstructive pulmonary disease with emphysema She reports minimal use of albuterol  inhaler, indicating good symptom control. Experiences occasional dyspnea, particularly on inclines and during allergy seasons, but it does not significantly impact daily activities. She is on Trelegy, which she finds more effective than Anoro. Recent CT shows stable emphysema and lung nodules. - Refill albuterol  inhaler prescription for use as needed, especially before exercise. - Continue Trelegy for COPD management. - Encourage continued exercise and use of personal trainer to improve conditioning.  Stable pulmonary nodules Pulmonary nodules are stable as per the last CT scan in April. Continue annual screening CT chest  Allergic rhinitis Allergic rhinitis contributes to occasional dyspnea, particularly in the fall and spring. Symptoms are manageable and do not prevent her from engaging in activities.  Deconditioning Deconditioning due to previous neck and knee issues. Actively working with a Systems analyst to improve strength and conditioning. Reports improvement in muscle strength and overall condition. - Continue working with personal trainer to improve strength and conditioning.   Plan/Recommendations: - Annual screening CT. - Continue Trelegy  Lonna Coder MD [facility] 05/05/2024, 2:26 PM  CC: Johnny Garnette LABOR, MD

## 2024-06-17 NOTE — Progress Notes (Signed)
 Office Visit Note  Patient: Emily Ward             Date of Birth: 12/20/1955           MRN: [id number]             PCP: Johnny Garnette LABOR, MD Referring: Johnny Garnette LABOR, MD Visit Date: 07/01/2024 Occupation: Data Unavailable  Subjective:  Intermittent buttock pain   History of Present Illness: Zsazsa Bahena is a 68 y.o. female with osteoarthritis, degenerative disc disease and osteoporosis.  She returns today after her last visit in April 2025.  She states she has been having intermittent discomfort in the buttock region for the last 1 month.  She continues to have some pain and stiffness in her hands intermittently.  The pain is mostly over the [facility] joints.  She has not noticed any joint swelling.  She states few months back she started having some right knee joint discomfort for which she was seen by an orthopedic surgeon.  She states she was told that she had patella tendon strain.  The symptoms improved over time.  She continues to have intermittent pain in her left knee joint.  She has chronic discomfort in her neck, upper back and lower back.  She continues to have dry mouth and dry eye symptoms which are manageable with over-the-counter medications.  She has generalized pain from fibromyalgia.  She has been taking Flexeril  10 mg p.o. nightly as needed.  She takes Ambien  2.5 mg p.o. nightly as needed for insomnia.  She is on Prolia  subcu every 6 months for osteoporosis.  Last Prolia  injection was on February 17, 2024.  She has been going to a systems analyst now twice a month and feels stronger.  She is exercising on a regular basis.    Activities of Daily Living:  Patient reports morning stiffness for within 30 minutes.   Patient Denies nocturnal pain.  Difficulty dressing/grooming: Denies Difficulty climbing stairs: Denies Difficulty getting out of chair: Denies Difficulty using hands for taps, buttons, cutlery, and/or writing: Reports  Review of Systems   Constitutional:  Negative for fatigue.  HENT:  Positive for mouth dryness. Negative for mouth sores.   Eyes:  Positive for dryness.  Respiratory:  Positive for shortness of breath.   Cardiovascular:  Negative for chest pain and palpitations.  Gastrointestinal:  Negative for blood in stool, constipation and diarrhea.  Endocrine: Negative for increased urination.  Genitourinary:  Positive for involuntary urination.  Musculoskeletal:  Positive for joint pain, joint pain, muscle weakness, morning stiffness and muscle tenderness. Negative for gait problem, joint swelling, myalgias and myalgias.  Skin:  Negative for color change, rash, hair loss and sensitivity to sunlight.  Allergic/Immunologic: Positive for susceptible to infections.  Neurological:  Positive for headaches. Negative for dizziness.  Hematological:  Negative for swollen glands.  Psychiatric/Behavioral:  Positive for depressed mood and sleep disturbance. The patient is nervous/anxious.     PMFS History:  Patient Active Problem List   Diagnosis Date Noted   Recurrent UTI 10/04/2022   Hyperparathyroidism 10/18/2020   Osteoporosis 10/18/2020   Concussion 10/27/2018   MGUS (monoclonal gammopathy of unknown significance) 02/13/2018   COPD with emphysema (HCC) 10/04/2017   Palpitations 07/30/2017   Shortness of breath 07/30/2017   Hormone replacement therapy (HRT) 03/30/2017   Former smoker 03/30/2017   Other fatigue 06/30/2016   Insomnia 06/30/2016   DJD (degenerative joint disease), cervical 06/30/2016   Spondylosis of lumbar region without myelopathy or radiculopathy  06/30/2016   IBS (irritable bowel syndrome) 06/30/2016   Interstitial cystitis 06/30/2016   SHOULDER PAIN, RIGHT 09/05/2010   GERD 03/24/2009   SACROILIAC STRAIN 03/24/2009   Atypical chest pain 01/20/2008   HEMATURIA UNSPECIFIED 10/14/2007   Fibromyalgia 07/03/2007   Dyslipidemia 05/16/2007   HEADACHE 05/09/2007    Past Medical History:  Diagnosis  Date   Anxiety    Cervical disc disease 2006   COPD (chronic obstructive pulmonary disease) (HCC)    Depression    Emphysema lung (HCC)    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     Fibromyalgia    sees Dr. 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 brother   Colon cancer Maternal Grandmother    Arthritis Son        psoriatic   Cancer Other        breast/fhx   Heart disease Other        fhx   Esophageal cancer Neg Hx    Stomach cancer Neg Hx    Colon polyps Neg Hx    Past Surgical History:  Procedure Laterality Date   BREAST BIOPSY Right 01/24/2023   MM RT BREAST BX W LOC DEV 1ST LESION IMAGE BX SPEC STEREO GUIDE 01/24/2023 [facility]   BREAST BIOPSY Right 01/24/2023   MM RT BREAST BX W LOC DEV EA AD LESION IMG BX SPEC STEREO GUIDE 01/24/2023 [facility]   CERVICAL LAMINECTOMY  2006   CESAREAN SECTION  1993   CHOLECYSTECTOMY  06/2002   COLONOSCOPY  02/11/2023   per Dr. Rosario Kidney, adenmatous polyps, repeat in one year   CYSTOSCOPY N/A 09/01/2018   Procedure: CYSTOSCOPY;  Surgeon: Cleotilde Ronal RAMAN, MD;  Location: [facility];  Service: Gynecology;  Laterality: N/A;   DILATATION & CURETTAGE/HYSTEROSCOPY WITH MYOSURE N/A 04/12/2017   Procedure: DILATATION & CURETTAGE/HYSTEROSCOPY;  Surgeon: Cleotilde Ronal RAMAN, MD;  Location: [facility];  Service: Gynecology;  Laterality: N/A;   epidural thoracic spine injection  06/07/2022   facet joint block  03/30/2021   per patient, performed by Dr. Eldonna    facet joint block  01/31/2022   per patient, performed by Dr. Eldonna   HYSTEROSCOPY     years ago with Dr Leva-? 2000   KNEE ARTHROSCOPY Left 10/24/2022   TOTAL LAPAROSCOPIC HYSTERECTOMY WITH SALPINGECTOMY Bilateral 09/01/2018   Procedure: TOTAL LAPAROSCOPIC HYSTERECTOMY WITH SALPINGECTOMY  BSO, REPAIR OF PERINEAL LACERATION;  Surgeon: Cleotilde Ronal RAMAN, MD;  Location: [facility];  Service: Gynecology;  Laterality: Bilateral;  possible BSO   URETHRAL DILATION     Social History   Tobacco Use   Smoking status: Former    Current packs/day: 0.00    Average packs/day: 1 pack/day for 20.0 years (20.0 ttl pk-yrs)    Types: Cigarettes    Start date: 04/02/1993  Quit date: 04/02/2013    Years since quitting: 11.2    Passive exposure: Never   Smokeless tobacco: Never  Vaping Use   Vaping status: Never Used  Substance Use Topics   Alcohol use: Yes    Alcohol/week: 1.0 standard drink of alcohol    Types: 1 Glasses of wine per week    Comment: occ   Drug use: Never   Social History   Social History Narrative   Not on file     Immunization History  Administered Date(s) Administered   Influenza Whole 06/30/2007, 05/04/2010   Influenza,inj,Quad PF,6+ Mos 07/12/2017, 05/23/2018, 09/08/2021   Influenza-Unspecified 06/02/2014, 06/03/2016, 06/20/2020   MMR 06/30/2007   Moderna Sars-Covid-2 Vaccination 09/07/2019, 10/12/2019, 07/13/2020, 02/21/2021   PNEUMOCOCCAL CONJUGATE-20 10/03/2022   Pneumococcal Polysaccharide-23 03/23/2019   Td 09/03/1998, 05/05/2009   Tdap 10/22/2018     Objective: Vital Signs: BP 95/69   Pulse 78   Temp 97.6 F (36.4 C)   Resp 14   Ht 5' 5 (1.651 m)   Wt 107 lb 6.4 oz (48.7 kg)   LMP 09/03/2000 (Approximate)   BMI 17.87 kg/m    Physical Exam Vitals and nursing note reviewed.  Constitutional:      Appearance: She is well-developed.  HENT:     Head: Normocephalic and atraumatic.  Eyes:     Conjunctiva/sclera: Conjunctivae  normal.  Cardiovascular:     Rate and Rhythm: Normal rate and regular rhythm.     Heart sounds: Normal heart sounds.  Pulmonary:     Effort: Pulmonary effort is normal.     Breath sounds: Normal breath sounds.  Abdominal:     General: Bowel sounds are normal.     Palpations: Abdomen is soft.  Musculoskeletal:     Cervical back: Normal range of motion.  Lymphadenopathy:     Cervical: No cervical adenopathy.  Skin:    General: Skin is warm and dry.     Capillary Refill: Capillary refill takes less than 2 seconds.  Neurological:     Mental Status: She is alert and oriented to person, place, and time.  Psychiatric:        Behavior: Behavior normal.      Musculoskeletal Exam: Cervical, thoracic and lumbar spine were in good range of motion.  She had tenderness over bilateral ischial bursa.  Shoulders, elbows, wrist joints, MCPs PIPs and DIPs were in good range of motion.  Bilateral PIP and DIP thickening was noted.  She has some tenderness over  right CMC joint.  Hip joints and knee joints in good range of motion without any warmth swelling or effusion.  She had bilateral bunions.  CDAI Exam: CDAI Score: -- Patient Global: --; Provider Global: -- Swollen: --; Tender: -- Joint Exam 07/01/2024   No joint exam has been documented for this visit   There is currently no information documented on the homunculus. Go to the Rheumatology activity and complete the homunculus joint exam.  Investigation: No additional findings.  Imaging: No results found.  Recent Labs: Lab Results  Component Value Date   WBC 5.1 01/06/2024   HGB 14.5 01/06/2024   PLT 186.0 01/06/2024   NA 140 01/06/2024   K 4.4 01/06/2024   CL 104 01/06/2024   CO2 28 01/06/2024   GLUCOSE 95 01/06/2024   BUN 19 01/06/2024   CREATININE 0.55 01/06/2024   BILITOT 0.4 01/06/2024   ALKPHOS 67 01/06/2024   AST 22 01/06/2024   ALT 10 01/06/2024   PROT 7.0 01/06/2024  ALBUMIN 4.3 01/06/2024   CALCIUM  9.8  01/06/2024   GFRAA >60 10/22/2018    Speciality Comments: Prolia  started 2019.  Last injection July 31, 2021.  Patient wants to switch Prolia  injections to our office.  Procedures:  No procedures performed Allergies: Divalproex sodium, Zoloft [sertraline], and Nsaids   Assessment / Plan:     Visit Diagnoses: Age-related osteoporosis without current pathological fracture - DEXA 07/24/23:  The BMD measured at Femur Total Left is 0.591 g/cm2 with a T-score of -3.3. Prolia  since 2019 initially prescribed by Dr. Tommas.  Her last Prolia  injection was in June 2025.  She has been taking calcium  and vitamin D .  She declined Evenity in the past.  Next pulley injection will be in December.  She will have repeat DEXA scan in November 2026.  Vitamin D  deficiency-vitamin D  has been normal.  Medication monitoring encounter - last Prolia  injection: 02/17/2024  Primary osteoarthritis of both hands-she has been having some discomfort over the [facility] joints.  PIP and DIP thickening was noted.  Chronic pain of left knee - She is under are of Dr. Kay and was diagnosed with meniscal tear.  She underwent meniscal tear repair in February 2024.  Currently not symptomatic.  She has some discomfort in the right knee joint recently which resolved.  Trapezius muscle spasm -she continues to have some trapezius spasm.  Range of motion exercises were advised.  DDD (degenerative disc disease), cervical -neck discomfort.  MRI performed on 05/03/20.  She establish care with Dr. Eldonna in September 2021.  She had facet joint injections on 06/02/2020, 03/30/2021, and 01/31/2022.  Pain in thoracic spine -she has chronic pain.  Thoracic spine pain resolved after T9-T10 epidural injection done by Dr. Eldonna.  Spondylosis of lumbar spine-she continues to have off and on discomfort in her lumbar region.  Ischial bursitis, unspecified laterality-she had bilateral ischial bursitis.  Stretching exercises were demonstrated.  Sicca  syndrome -over-the-counter products were discussed.  07/03/2023: ANA neg, RF neg, Ro antibody neg, line body neg, double-stranded neg, complements WNL, Smith antibody neg, RNP antibody neg, SCL 70 antibody neg.  Fibromyalgia-she continues to have some generalized pain and discomfort.  She takes Flexeril  10 mg p.o. nightly only on as needed basis.  Primary insomnia -she has been taking Ambien  2.5 mg at bedtime for insomnia 3-4 times a week.  Other medical problems are listed as follows:SABRA  TMJ (temporomandibular joint syndrome)  Interstitial cystitis  History of gastroesophageal reflux (GERD)  History of IBS  Anxiety and depression  Orders: No orders of the defined types were placed in this encounter.  No orders of the defined types were placed in this encounter.   Follow-Up Instructions: Return in about 6 months (around 12/30/2024) for Osteoarthritis, Osteoporosis.   Maya Nash, MD  Note - This record has been created using Animal nutritionist.  Chart creation errors have been sought, but may not always  have been located. Such creation errors do not reflect on  the standard of medical care.

## 2024-07-01 ENCOUNTER — Ambulatory Visit: Attending: Rheumatology | Admitting: Rheumatology

## 2024-07-01 ENCOUNTER — Encounter: Payer: Self-pay | Admitting: Rheumatology

## 2024-07-01 VITALS — BP 95/69 | HR 78 | Temp 97.6°F | Resp 14 | Ht 65.0 in | Wt 107.4 lb

## 2024-07-01 DIAGNOSIS — M62838 Other muscle spasm: Secondary | ICD-10-CM

## 2024-07-01 DIAGNOSIS — M810 Age-related osteoporosis without current pathological fracture: Secondary | ICD-10-CM | POA: Diagnosis not present

## 2024-07-01 DIAGNOSIS — Z5181 Encounter for therapeutic drug level monitoring: Secondary | ICD-10-CM | POA: Diagnosis not present

## 2024-07-01 DIAGNOSIS — M546 Pain in thoracic spine: Secondary | ICD-10-CM

## 2024-07-01 DIAGNOSIS — M3500 Sicca syndrome, unspecified: Secondary | ICD-10-CM

## 2024-07-01 DIAGNOSIS — M19041 Primary osteoarthritis, right hand: Secondary | ICD-10-CM | POA: Diagnosis not present

## 2024-07-01 DIAGNOSIS — F419 Anxiety disorder, unspecified: Secondary | ICD-10-CM

## 2024-07-01 DIAGNOSIS — M26609 Unspecified temporomandibular joint disorder, unspecified side: Secondary | ICD-10-CM

## 2024-07-01 DIAGNOSIS — E559 Vitamin D deficiency, unspecified: Secondary | ICD-10-CM

## 2024-07-01 DIAGNOSIS — M797 Fibromyalgia: Secondary | ICD-10-CM

## 2024-07-01 DIAGNOSIS — M25562 Pain in left knee: Secondary | ICD-10-CM

## 2024-07-01 DIAGNOSIS — M7070 Other bursitis of hip, unspecified hip: Secondary | ICD-10-CM

## 2024-07-01 DIAGNOSIS — G8929 Other chronic pain: Secondary | ICD-10-CM

## 2024-07-01 DIAGNOSIS — Z8719 Personal history of other diseases of the digestive system: Secondary | ICD-10-CM

## 2024-07-01 DIAGNOSIS — F5101 Primary insomnia: Secondary | ICD-10-CM

## 2024-07-01 DIAGNOSIS — M19042 Primary osteoarthritis, left hand: Secondary | ICD-10-CM

## 2024-07-01 DIAGNOSIS — F32A Depression, unspecified: Secondary | ICD-10-CM

## 2024-07-01 DIAGNOSIS — N3010 Interstitial cystitis (chronic) without hematuria: Secondary | ICD-10-CM

## 2024-07-01 DIAGNOSIS — M5030 Other cervical disc degeneration, unspecified cervical region: Secondary | ICD-10-CM

## 2024-07-01 DIAGNOSIS — M47816 Spondylosis without myelopathy or radiculopathy, lumbar region: Secondary | ICD-10-CM

## 2024-07-11 ENCOUNTER — Other Ambulatory Visit: Payer: Self-pay | Admitting: Family Medicine

## 2024-07-20 ENCOUNTER — Encounter: Payer: Self-pay | Admitting: Pharmacist

## 2024-07-20 ENCOUNTER — Telehealth: Payer: Self-pay | Admitting: Pharmacist

## 2024-07-20 DIAGNOSIS — M810 Age-related osteoporosis without current pathological fracture: Secondary | ICD-10-CM

## 2024-07-20 DIAGNOSIS — E559 Vitamin D deficiency, unspecified: Secondary | ICD-10-CM

## 2024-07-20 DIAGNOSIS — Z5181 Encounter for therapeutic drug level monitoring: Secondary | ICD-10-CM

## 2024-07-20 NOTE — Telephone Encounter (Signed)
 Patient due for Prolia  on 08/15/2024  Appears to be scheduled for 08/21/2024.  She will need updated CMP and Vitamin D  before Prolia . MyChart message sent. Future lab orders placed.  Sherry Pennant, PharmD, MPH, BCPS, CPP Clinical Pharmacist [facility])

## 2024-08-12 ENCOUNTER — Other Ambulatory Visit: Payer: Self-pay | Admitting: *Deleted

## 2024-08-12 DIAGNOSIS — E559 Vitamin D deficiency, unspecified: Secondary | ICD-10-CM

## 2024-08-12 DIAGNOSIS — Z5181 Encounter for therapeutic drug level monitoring: Secondary | ICD-10-CM

## 2024-08-12 DIAGNOSIS — M810 Age-related osteoporosis without current pathological fracture: Secondary | ICD-10-CM

## 2024-08-13 ENCOUNTER — Other Ambulatory Visit: Payer: Self-pay | Admitting: Family Medicine

## 2024-08-13 ENCOUNTER — Ambulatory Visit: Payer: Self-pay | Admitting: Rheumatology

## 2024-08-13 LAB — COMPREHENSIVE METABOLIC PANEL WITH GFR
AG Ratio: 1.8 (calc) (ref 1.0–2.5)
ALT: 9 U/L (ref 6–29)
AST: 21 U/L (ref 10–35)
Albumin: 4.4 g/dL (ref 3.6–5.1)
Alkaline phosphatase (APISO): 76 U/L (ref 37–153)
BUN: 18 mg/dL (ref 7–25)
CO2: 26 mmol/L (ref 20–32)
Calcium: 9.7 mg/dL (ref 8.6–10.4)
Chloride: 104 mmol/L (ref 98–110)
Creat: 0.61 mg/dL (ref 0.50–1.05)
Globulin: 2.5 g/dL (ref 1.9–3.7)
Glucose, Bld: 98 mg/dL (ref 65–99)
Potassium: 4.3 mmol/L (ref 3.5–5.3)
Sodium: 140 mmol/L (ref 135–146)
Total Bilirubin: 0.5 mg/dL (ref 0.2–1.2)
Total Protein: 6.9 g/dL (ref 6.1–8.1)
eGFR: 99 mL/min/1.73m2 (ref >=60)

## 2024-08-13 LAB — VITAMIN D 25 HYDROXY (VIT D DEFICIENCY, FRACTURES): Vit D, 25-Hydroxy: 51 ng/mL (ref 30–100)

## 2024-08-21 ENCOUNTER — Telehealth (HOSPITAL_COMMUNITY): Payer: Self-pay | Admitting: Pharmacist

## 2024-08-21 ENCOUNTER — Other Ambulatory Visit (HOSPITAL_COMMUNITY): Payer: Self-pay | Admitting: Rheumatology

## 2024-08-21 ENCOUNTER — Ambulatory Visit

## 2024-08-21 ENCOUNTER — Telehealth: Payer: Self-pay

## 2024-08-21 ENCOUNTER — Ambulatory Visit (HOSPITAL_COMMUNITY)
Admission: RE | Admit: 2024-08-21 | Discharge: 2024-08-21 | Disposition: A | Source: Ambulatory Visit | Attending: Rheumatology

## 2024-08-21 VITALS — BP 108/70 | HR 76 | Temp 97.0°F | Resp 17

## 2024-08-21 DIAGNOSIS — M810 Age-related osteoporosis without current pathological fracture: Secondary | ICD-10-CM | POA: Diagnosis present

## 2024-08-21 MED ORDER — DENOSUMAB 60 MG/ML ~~LOC~~ SOSY: 60.0000 mg | PREFILLED_SYRINGE | Freq: Once | SUBCUTANEOUS | Status: DC

## 2024-08-21 MED ORDER — DENOSUMAB-BBDZ 60 MG/ML ~~LOC~~ SOSY
PREFILLED_SYRINGE | SUBCUTANEOUS | Status: AC
  Filled 2024-08-21: qty 1

## 2024-08-21 MED ORDER — DENOSUMAB-BBDZ 60 MG/ML ~~LOC~~ SOSY
60.0000 mg | PREFILLED_SYRINGE | Freq: Once | SUBCUTANEOUS | Status: AC
  Administered 2024-08-21: 60 mg via SUBCUTANEOUS

## 2024-08-21 NOTE — Telephone Encounter (Signed)
 Spoke with patient - she was scheduled for Prolia  at [facility] infusion center but has [employer]. Insurance covers Jubbonti  now and patient will be moved to [facility] infusion center.  Discussed the switch to biosimilar with the patient. Reviewed that biosimilars are as clinically effective, as safe, and no more harmful than the originator product based on FDA studies. They are similar to generics but are different at molecular level and some require physician approval for the switch. Reviewed that biosimilars are FDA-approved and that studies are completed in patients who are clinically established on Prolia  and switched to biosimiliar. Discussed that dose and frequency are the same as for Prolia .  Patient is in agreement to biosimilar switch from Prolia  to Jubbonti .   Patient aware that [facility] infusion center will reach out to schedule Jubbonti .  Sherry Pennant, PharmD, MPH, BCPS, CPP Clinical Pharmacist

## 2024-08-21 NOTE — Telephone Encounter (Signed)
 Had to submit another auth to change SOC to [facility].  Auth Submission: APPROVED Site of care: [facility] Payer: [employer] MEDICARE Medication & CPT/J Code(s) submitted: V4863 JUBBONTI (denosumab -bbdz) Diagnosis Code: m81.0 Route of submission (phone, fax, portal): portal Phone # Fax # Auth type: Buy/Bill HB Units/visits requested: 60mg  x 2 doses, q 6 months Reference number: [id number] Approval from: 08/21/24 to 08/21/25    Dagoberto Armour, CPhT [facility] Phone: [phone] 08/21/2024

## 2024-08-21 NOTE — Telephone Encounter (Signed)
 Auth Submission: APPROVED - Renewal Site of care: Site of care: [facility] Payer: [employer] medicare Medication & CPT/J Code(s) submitted: Jubbonti [id number] Diagnosis Code:  Route of submission (phone, fax, portal): portal Phone # Fax # Auth type: Buy/Bill PB Units/visits requested: 60mg  x 2 doses Reference number: [id number] Approval from: 08/21/24 to 08/21/25

## 2024-09-12 ENCOUNTER — Other Ambulatory Visit: Payer: Self-pay | Admitting: Pulmonary Disease

## 2024-09-24 ENCOUNTER — Other Ambulatory Visit: Payer: Self-pay | Admitting: Family Medicine

## 2024-12-21 ENCOUNTER — Other Ambulatory Visit

## 2024-12-30 ENCOUNTER — Ambulatory Visit: Admitting: Physician Assistant

## 2025-02-22 ENCOUNTER — Encounter (HOSPITAL_COMMUNITY)

## 2026-01-17 ENCOUNTER — Ambulatory Visit (HOSPITAL_BASED_OUTPATIENT_CLINIC_OR_DEPARTMENT_OTHER): Admitting: Obstetrics & Gynecology
